# Patient Record
Sex: Male | Born: 1945 | Race: White | Hispanic: No | Marital: Married | Smoking: Former smoker
Health system: Southern US, Community
[De-identification: ages and names within clinical notes are randomized; demographics above are authoritative.]

## PROBLEM LIST (undated history)

## (undated) DIAGNOSIS — M1990 Unspecified osteoarthritis, unspecified site: Secondary | ICD-10-CM

## (undated) DIAGNOSIS — E079 Disorder of thyroid, unspecified: Secondary | ICD-10-CM

## (undated) DIAGNOSIS — K649 Unspecified hemorrhoids: Secondary | ICD-10-CM

## (undated) DIAGNOSIS — J449 Chronic obstructive pulmonary disease, unspecified: Secondary | ICD-10-CM

## (undated) DIAGNOSIS — I350 Nonrheumatic aortic (valve) stenosis: Secondary | ICD-10-CM

## (undated) DIAGNOSIS — E559 Vitamin D deficiency, unspecified: Secondary | ICD-10-CM

## (undated) DIAGNOSIS — E039 Hypothyroidism, unspecified: Secondary | ICD-10-CM

## (undated) DIAGNOSIS — F32A Depression, unspecified: Secondary | ICD-10-CM

## (undated) DIAGNOSIS — N200 Calculus of kidney: Secondary | ICD-10-CM

## (undated) DIAGNOSIS — I10 Essential (primary) hypertension: Secondary | ICD-10-CM

## (undated) DIAGNOSIS — R011 Cardiac murmur, unspecified: Secondary | ICD-10-CM

## (undated) DIAGNOSIS — I951 Orthostatic hypotension: Secondary | ICD-10-CM

## (undated) DIAGNOSIS — Z992 Dependence on renal dialysis: Secondary | ICD-10-CM

## (undated) DIAGNOSIS — F329 Major depressive disorder, single episode, unspecified: Secondary | ICD-10-CM

## (undated) DIAGNOSIS — I2510 Atherosclerotic heart disease of native coronary artery without angina pectoris: Secondary | ICD-10-CM

## (undated) DIAGNOSIS — R55 Syncope and collapse: Secondary | ICD-10-CM

## (undated) DIAGNOSIS — E785 Hyperlipidemia, unspecified: Secondary | ICD-10-CM

## (undated) DIAGNOSIS — E782 Mixed hyperlipidemia: Secondary | ICD-10-CM

## (undated) DIAGNOSIS — N186 End stage renal disease: Secondary | ICD-10-CM

## (undated) DIAGNOSIS — M4306 Spondylolysis, lumbar region: Secondary | ICD-10-CM

## (undated) DIAGNOSIS — Z9889 Other specified postprocedural states: Secondary | ICD-10-CM

## (undated) DIAGNOSIS — M109 Gout, unspecified: Secondary | ICD-10-CM

## (undated) HISTORY — PX: CAROTID ENDARTERECTOMY: SUR193

## (undated) HISTORY — DX: Unspecified osteoarthritis, unspecified site: M19.90

## (undated) HISTORY — DX: Gout, unspecified: M10.9

## (undated) HISTORY — DX: Nonrheumatic aortic (valve) stenosis: I35.0

## (undated) HISTORY — DX: Atherosclerotic heart disease of native coronary artery without angina pectoris: I25.10

## (undated) HISTORY — PX: OTHER SURGICAL HISTORY: SHX169

## (undated) HISTORY — DX: Depression, unspecified: F32.A

## (undated) HISTORY — DX: Major depressive disorder, single episode, unspecified: F32.9

## (undated) HISTORY — DX: Chronic obstructive pulmonary disease, unspecified: J44.9

## (undated) HISTORY — DX: Spondylolysis, lumbar region: M43.06

## (undated) HISTORY — DX: Essential (primary) hypertension: I10

## (undated) HISTORY — DX: End stage renal disease: Z99.2

## (undated) HISTORY — PX: CARDIAC CATHETERIZATION: SHX172

## (undated) HISTORY — DX: Cardiac murmur, unspecified: R01.1

## (undated) HISTORY — DX: Calculus of kidney: N20.0

## (undated) HISTORY — DX: Hyperlipidemia, unspecified: E78.5

## (undated) HISTORY — DX: Syncope and collapse: R55

## (undated) HISTORY — DX: Disorder of thyroid, unspecified: E07.9

## (undated) HISTORY — DX: Vitamin D deficiency, unspecified: E55.9

## (undated) HISTORY — DX: Morbid (severe) obesity due to excess calories: E66.01

## (undated) HISTORY — DX: Hypothyroidism, unspecified: E03.9

## (undated) HISTORY — PX: CIRCUMCISION: SUR203

## (undated) HISTORY — PX: JOINT REPLACEMENT: SHX530

## (undated) HISTORY — DX: Orthostatic hypotension: I95.1

## (undated) HISTORY — DX: Mixed hyperlipidemia: E78.2

## (undated) HISTORY — DX: End stage renal disease: N18.6

---

## 2001-07-28 ENCOUNTER — Ambulatory Visit (HOSPITAL_COMMUNITY): Admission: RE | Admit: 2001-07-28 | Discharge: 2001-07-28 | Payer: Self-pay | Admitting: Endocrinology

## 2002-07-28 ENCOUNTER — Emergency Department (HOSPITAL_COMMUNITY): Admission: EM | Admit: 2002-07-28 | Discharge: 2002-07-28 | Payer: Self-pay | Admitting: Internal Medicine

## 2002-07-29 ENCOUNTER — Emergency Department (HOSPITAL_COMMUNITY): Admission: EM | Admit: 2002-07-29 | Discharge: 2002-07-29 | Payer: Self-pay | Admitting: Emergency Medicine

## 2002-07-29 ENCOUNTER — Encounter: Payer: Self-pay | Admitting: Emergency Medicine

## 2002-07-30 ENCOUNTER — Emergency Department (HOSPITAL_COMMUNITY): Admission: EM | Admit: 2002-07-30 | Discharge: 2002-07-30 | Payer: Self-pay | Admitting: *Deleted

## 2002-07-31 ENCOUNTER — Ambulatory Visit (HOSPITAL_COMMUNITY): Admission: RE | Admit: 2002-07-31 | Discharge: 2002-07-31 | Payer: Self-pay | Admitting: Pulmonary Disease

## 2002-08-01 ENCOUNTER — Encounter (HOSPITAL_COMMUNITY): Admission: RE | Admit: 2002-08-01 | Discharge: 2002-08-10 | Payer: Self-pay | Admitting: Pulmonary Disease

## 2002-12-11 ENCOUNTER — Encounter: Payer: Self-pay | Admitting: *Deleted

## 2002-12-11 ENCOUNTER — Inpatient Hospital Stay (HOSPITAL_COMMUNITY): Admission: EM | Admit: 2002-12-11 | Discharge: 2002-12-13 | Payer: Self-pay | Admitting: *Deleted

## 2002-12-12 ENCOUNTER — Encounter: Payer: Self-pay | Admitting: Cardiovascular Disease

## 2002-12-22 ENCOUNTER — Encounter (HOSPITAL_COMMUNITY): Admission: RE | Admit: 2002-12-22 | Discharge: 2003-01-21 | Payer: Self-pay | Admitting: Endocrinology

## 2003-03-22 ENCOUNTER — Encounter: Payer: Self-pay | Admitting: Cardiovascular Disease

## 2003-03-22 ENCOUNTER — Ambulatory Visit (HOSPITAL_COMMUNITY): Admission: RE | Admit: 2003-03-22 | Discharge: 2003-03-22 | Payer: Self-pay | Admitting: Cardiovascular Disease

## 2008-04-30 ENCOUNTER — Ambulatory Visit (HOSPITAL_COMMUNITY): Admission: RE | Admit: 2008-04-30 | Discharge: 2008-04-30 | Payer: Self-pay | Admitting: Pulmonary Disease

## 2008-09-20 ENCOUNTER — Ambulatory Visit (HOSPITAL_COMMUNITY): Admission: RE | Admit: 2008-09-20 | Discharge: 2008-09-20 | Payer: Self-pay | Admitting: Pulmonary Disease

## 2008-09-24 ENCOUNTER — Encounter (INDEPENDENT_AMBULATORY_CARE_PROVIDER_SITE_OTHER): Payer: Self-pay | Admitting: Pulmonary Disease

## 2008-09-24 ENCOUNTER — Ambulatory Visit: Payer: Self-pay | Admitting: Internal Medicine

## 2008-09-24 ENCOUNTER — Ambulatory Visit (HOSPITAL_COMMUNITY): Admission: RE | Admit: 2008-09-24 | Discharge: 2008-09-24 | Payer: Self-pay | Admitting: Pulmonary Disease

## 2008-10-08 ENCOUNTER — Ambulatory Visit: Payer: Self-pay | Admitting: Surgery

## 2008-10-17 ENCOUNTER — Encounter: Payer: Self-pay | Admitting: Surgery

## 2008-10-17 ENCOUNTER — Ambulatory Visit: Payer: Self-pay | Admitting: Surgery

## 2008-10-17 ENCOUNTER — Inpatient Hospital Stay (HOSPITAL_COMMUNITY): Admission: RE | Admit: 2008-10-17 | Discharge: 2008-10-18 | Payer: Self-pay | Admitting: Surgery

## 2008-11-05 ENCOUNTER — Ambulatory Visit: Payer: Self-pay | Admitting: Surgery

## 2009-04-29 ENCOUNTER — Inpatient Hospital Stay (HOSPITAL_COMMUNITY): Admission: RE | Admit: 2009-04-29 | Discharge: 2009-05-01 | Payer: Self-pay | Admitting: Orthopedic Surgery

## 2009-05-20 ENCOUNTER — Encounter (HOSPITAL_COMMUNITY): Admission: RE | Admit: 2009-05-20 | Discharge: 2009-06-19 | Payer: Self-pay | Admitting: Orthopedic Surgery

## 2009-06-05 ENCOUNTER — Inpatient Hospital Stay (HOSPITAL_COMMUNITY): Admission: RE | Admit: 2009-06-05 | Discharge: 2009-06-07 | Payer: Self-pay | Admitting: Orthopedic Surgery

## 2009-07-01 ENCOUNTER — Encounter (HOSPITAL_COMMUNITY): Admission: RE | Admit: 2009-07-01 | Discharge: 2009-07-31 | Payer: Self-pay | Admitting: Orthopedic Surgery

## 2010-09-30 LAB — BASIC METABOLIC PANEL
BUN: 29 mg/dL — ABNORMAL HIGH (ref 6–23)
CO2: 27 mEq/L (ref 19–32)
Chloride: 102 mEq/L (ref 96–112)
Creatinine, Ser: 1.67 mg/dL — ABNORMAL HIGH (ref 0.4–1.5)
GFR calc non Af Amer: 42 mL/min — ABNORMAL LOW (ref >60)
GFR calc non Af Amer: >60 mL/min (ref >60)
Glucose, Bld: 106 mg/dL — ABNORMAL HIGH (ref 70–99)
Potassium: 4.3 mEq/L (ref 3.5–5.1)
Sodium: 135 mEq/L (ref 135–145)

## 2010-09-30 LAB — URINALYSIS, ROUTINE W REFLEX MICROSCOPIC
Bilirubin Urine: NEGATIVE
Nitrite: NEGATIVE
Specific Gravity, Urine: 1.013 (ref 1.005–1.030)
pH: 5.5 (ref 5.0–8.0)

## 2010-09-30 LAB — PROTIME-INR
INR: 1.24 (ref 0.00–1.49)
Prothrombin Time: 13.9 seconds (ref 11.6–15.2)
Prothrombin Time: 15.5 seconds — ABNORMAL HIGH (ref 11.6–15.2)

## 2010-09-30 LAB — APTT: aPTT: 29 seconds (ref 24–37)

## 2010-09-30 LAB — TYPE AND SCREEN: Antibody Screen: NEGATIVE

## 2010-09-30 LAB — CBC
HCT: 40.1 % (ref 39.0–52.0)
Hemoglobin: 13.9 g/dL (ref 13.0–17.0)
MCV: 94 fL (ref 78.0–100.0)
Platelets: 223 10*3/uL (ref 150–400)
RBC: 3.43 MIL/uL — ABNORMAL LOW (ref 4.22–5.81)
RDW: 13.7 % (ref 11.5–15.5)
WBC: 10.8 10*3/uL — ABNORMAL HIGH (ref 4.0–10.5)
WBC: 11.9 10*3/uL — ABNORMAL HIGH (ref 4.0–10.5)

## 2010-10-01 LAB — CBC
HCT: 36.7 % — ABNORMAL LOW (ref 39.0–52.0)
Hemoglobin: 12.8 g/dL — ABNORMAL LOW (ref 13.0–17.0)
MCV: 98.1 fL (ref 78.0–100.0)
Platelets: 240 10*3/uL (ref 150–400)
RBC: 3.48 MIL/uL — ABNORMAL LOW (ref 4.22–5.81)
WBC: 10.3 10*3/uL (ref 4.0–10.5)
WBC: 11.7 10*3/uL — ABNORMAL HIGH (ref 4.0–10.5)

## 2010-10-01 LAB — BASIC METABOLIC PANEL
BUN: 23 mg/dL (ref 6–23)
GFR calc non Af Amer: 40 mL/min — ABNORMAL LOW (ref >60)
Potassium: 4.3 mEq/L (ref 3.5–5.1)
Sodium: 134 mEq/L — ABNORMAL LOW (ref 135–145)

## 2010-10-01 LAB — PROTIME-INR
INR: 1.06 (ref 0.00–1.49)
Prothrombin Time: 16.2 seconds — ABNORMAL HIGH (ref 11.6–15.2)

## 2010-10-02 LAB — DIFFERENTIAL
Eosinophils Relative: 2 % (ref 0–5)
Lymphocytes Relative: 36 % (ref 12–46)
Lymphs Abs: 3.7 10*3/uL (ref 0.7–4.0)
Monocytes Absolute: 0.7 10*3/uL (ref 0.1–1.0)
Monocytes Relative: 7 % (ref 3–12)
Neutro Abs: 5.7 10*3/uL (ref 1.7–7.7)

## 2010-10-02 LAB — URINALYSIS, ROUTINE W REFLEX MICROSCOPIC
Bilirubin Urine: NEGATIVE
Hgb urine dipstick: NEGATIVE
Ketones, ur: NEGATIVE mg/dL
Protein, ur: NEGATIVE mg/dL
Urobilinogen, UA: 0.2 mg/dL (ref 0.0–1.0)

## 2010-10-02 LAB — BASIC METABOLIC PANEL
GFR calc Af Amer: >60 mL/min (ref >60)
GFR calc non Af Amer: 58 mL/min — ABNORMAL LOW (ref >60)
Glucose, Bld: 83 mg/dL (ref 70–99)
Potassium: 4.8 mEq/L (ref 3.5–5.1)
Sodium: 138 mEq/L (ref 135–145)

## 2010-10-02 LAB — CBC
HCT: 45.5 % (ref 39.0–52.0)
Hemoglobin: 15.8 g/dL (ref 13.0–17.0)
RBC: 4.71 MIL/uL (ref 4.22–5.81)
WBC: 10.4 10*3/uL (ref 4.0–10.5)

## 2010-10-02 LAB — APTT: aPTT: 30 seconds (ref 24–37)

## 2010-10-08 LAB — CBC
HCT: 35.7 % — ABNORMAL LOW (ref 39.0–52.0)
Hemoglobin: 12.5 g/dL — ABNORMAL LOW (ref 13.0–17.0)
Hemoglobin: 15.4 g/dL (ref 13.0–17.0)
MCHC: 35.2 g/dL (ref 30.0–36.0)
MCHC: 34.8 g/dL (ref 30.0–36.0)
MCV: 95.7 fL (ref 78.0–100.0)
RBC: 3.76 MIL/uL — ABNORMAL LOW (ref 4.22–5.81)
RDW: 12.9 % (ref 11.5–15.5)
RDW: 12.9 % (ref 11.5–15.5)

## 2010-10-08 LAB — BASIC METABOLIC PANEL
CO2: 24 mEq/L (ref 19–32)
Chloride: 107 mEq/L (ref 96–112)
Glucose, Bld: 103 mg/dL — ABNORMAL HIGH (ref 70–99)
Potassium: 4.1 mEq/L (ref 3.5–5.1)
Sodium: 136 mEq/L (ref 135–145)

## 2010-10-08 LAB — URINALYSIS, ROUTINE W REFLEX MICROSCOPIC
Glucose, UA: NEGATIVE mg/dL
Ketones, ur: NEGATIVE mg/dL
Nitrite: NEGATIVE
Protein, ur: 30 mg/dL — AB
Urobilinogen, UA: 0.2 mg/dL (ref 0.0–1.0)

## 2010-10-08 LAB — COMPREHENSIVE METABOLIC PANEL
ALT: 23 U/L (ref 0–53)
Calcium: 9.4 mg/dL (ref 8.4–10.5)
Creatinine, Ser: 1.29 mg/dL (ref 0.4–1.5)
GFR calc Af Amer: >60 mL/min (ref >60)
GFR calc non Af Amer: 56 mL/min — ABNORMAL LOW (ref >60)
Glucose, Bld: 83 mg/dL (ref 70–99)
Sodium: 138 mEq/L (ref 135–145)
Total Protein: 6.9 g/dL (ref 6.0–8.3)

## 2010-10-08 LAB — URINE MICROSCOPIC-ADD ON

## 2010-10-08 LAB — TYPE AND SCREEN: Antibody Screen: NEGATIVE

## 2010-10-08 LAB — PROTIME-INR: Prothrombin Time: 13 seconds (ref 11.6–15.2)

## 2010-10-08 LAB — APTT: aPTT: 31 seconds (ref 24–37)

## 2010-10-08 LAB — ABO/RH: ABO/RH(D): A NEG

## 2010-11-11 NOTE — Procedures (Signed)
CAROTID DUPLEX EXAM   INDICATION:  Follow-up evaluation of known carotid artery disease.   HISTORY:  Diabetes:  No.  Cardiac:  PTCA.  Hypertension:  Yes.  Smoking:  Yes.  Previous Surgery:  No.  CV History:  Patient had an episode of expressive aphasia in January.  Patient has a history of TIA and left-sided facial tingling.  Amaurosis Fugax No, Paresthesias  Yes, Hemiparesis No.                                       RIGHT             LEFT  Brachial systolic pressure:         146               148  Brachial Doppler waveforms:         Triphasic         Triphasic  Vertebral direction of flow:        Antegrade         Antegrade  DUPLEX VELOCITIES (cm/sec)  CCA peak systolic                   111               123456  ECA peak systolic                   190               [id number]  ICA peak systolic                   109               123456  ICA end diastolic                   40                228  PLAQUE MORPHOLOGY:                  Mixed             Calcified  PLAQUE AMOUNT:                      Mild-to-moderate  Severe  PLAQUE LOCATION:                    Proximal ICA      Proximal ICA, ECA   IMPRESSION:  1. 20-39% right internal carotid artery stenosis.  2. 80-99% left internal carotid artery stenosis.   ___________________________________________  V. Leia Alf, MD   MC/MEDQ  D:  10/08/2008  T:  10/08/2008  Job:  [id number]

## 2010-11-11 NOTE — Op Note (Signed)
NAMEEVERTTE, UNKEFER                 ACCOUNT NO.:  [id number]   MEDICAL RECORD NO.:  [id number]          PATIENT TYPE:  INP   LOCATION:  3301                         FACILITY:  [facility]   PHYSICIAN:  Theotis Burrow IV, MDDATE OF BIRTH:  [birth year]   DATE OF PROCEDURE:  10/17/2008  DATE OF DISCHARGE:                               OPERATIVE REPORT   PREOPERATIVE DIAGNOSIS:  Symptomatic left carotid stenosis   POSTOPERATIVE DIAGNOSIS:  Symptomatic left carotid stenosis   PROCEDURE PERFORMED:  Left carotid endarterectomy with patch  angioplasty.   TYPE OF ANESTHESIA:  General.   BLOOD LOSS:  200 mL.   COMPLICATIONS:  None.   DRAINS:  [place].   FINDINGS:  80% stenosis, ulcerated plaque.  No thrombus.   INDICATIONS:  Mr. Feltham is a 65 year old gentleman who presented with  symptomatic left carotid stenosis.  He was found by ultrasound to have  greater than 8% stenosis.  He comes in today for endarterectomy.  Risks  and benefits were discussed.   SURGEON:  1. Leia Alf, MD   ASSISTANT:  Jacinta Shoe, PA   PROCEDURE:  The patient was identified in the holding area and taken to  room #8.  He was placed supine on the table.  General endotracheal  anesthesia was administered.  The patient was prepped and draped in the  standard sterile fashion.  A time-out was called.  Antibiotics were  given.  An incision was made along the anterior border of the  sternocleidomastoid.  Cautery was used to divide the subcutaneous  tissue.  The platysma muscle was divided with Bovie cautery.  Next, the  carotid sheath was identified and opened sharply.  The common carotid  artery was then identified and mobilized.  It was then encircled with  umbilical tape.  Dissection then proceeded cephalad.  The superior  thyroid artery was identified and encircled with silk tie.  Next, the  common facial vein was identified and ligated between 2-0 silk ties.  Multiple venous branches were  identified each individually and ligated  between 3-0 silk ties.  The posterior belly of the digastric muscle was  identified.  Under this, the hypoglossal nerve was identified, partially  dissected and mobilized and protected.  Next, the external carotid  artery was identified and encircled with vessel loop.  The internal  carotid artery was then dissected free up to the level of the  hypoglossal nerve where it was free of disease.  At this point, the  umbilical tape was placed around the internal carotid artery.  The  patient was given systemic heparinization.  After the heparin had  circulated, the internal carotid artery was occluded followed by the  external carotid and common carotid artery.  A #11 blade was used to  make an arteriotomy in the common carotid artery.  This was extended on  the anterior lateral surface of the internal carotid artery with Potts  scissors.  An ulcerated plaque was identified at the carotid bifurcation  extending into the internal carotid.  There was approximately 80%  stenosis.  There was  no thrombus present.  Next, a 10-French shunt was  placed.  Endarterectomy was then performed with a Kleinert-Kutz  elevator.  Eversion endarterectomy was performed in the external carotid  artery.  A good distal endpoint was obtained and the plaque was removed.  The endarterectomized bed was irrigated with heparin saline.  All  potential embolic debris was removed.  Next, patch angioplasty was  performed with a bovine pericardial patch and a running 6-0 Prolene.  Prior to completion of repair, the shunt was removed.  The arteries were  all appropriately flushed.  The endarterectomy was then completed.  Clamp was released on the external carotid artery followed by the common  carotid artery.  Approximately 15 seconds later, the clamp on the  internal carotid artery was released.  Handheld Doppler was used to  evaluate flow within the common external and internal carotid  arteries.  All had good signals.  Next, the patient's heparin was reversed with 50  mg of protamine.  A #15 Blake drain was placed and brought out through a  separate skin incision and sewn in place with a 3-0 nylon.  The carotid  sheath was then reapproximated with a running 3-0 Vicryl.  The platysma  muscle was reapproximated with a running 3-0 Vicryl.  The skin was  closed with 4-0 Vicryl.  The patient tolerated the procedure well and  was successfully extubated and found to be moving all 4 extremities to  command.  He was taken to the recovery room in stable condition.      Eldridge Abrahams, MD  Electronically Signed     VWB/MEDQ  D:  10/17/2008  T:  10/18/2008  Job:  [id number]

## 2010-11-11 NOTE — Assessment & Plan Note (Signed)
OFFICE VISIT   DEWONE, SUNDERMAN  DOB:  Feb 13, 1946                                       11/05/2008  [id number]   REASON FOR VISIT:  Follow-up carotid.   HISTORY:  This 65 year old gentleman that I saw at the request of Dr.  Luan Pulling for evaluation of symptomatic carotid disease.  The patient had  two episodes of TIAs which consisted of confusion and memory problems as  well as facial numbness.  MRA and MRI did not reveal any intracranial  pathology.  The patient underwent left carotid endarterectomy on October 17, 2008.  Operative findings included 80% stenosis with an ulcerated  plaque without thrombus.  The patient's postoperative course was  uncomplicated.  He was discharged home on the following day. He comes in  today for follow-up.  He has no complaints.  He is doing very well at  this time.   The patient will follow with me in 35-month with a repeat ultrasound.  He  also was concerned about some leg pain.  I could not feel pedal pulses  and sent him for lower extremity study which showed he has  normal ankle  brachial indices.  Again he will follow up in 6 months.   Eldridge Abrahams, MD  Electronically Signed   VWB/MEDQ  D:  11/05/2008  T:  11/06/2008  Job:  [id number]   cc:   Percell Miller L. Luan Pulling, M.D.  Richard A. Rollene Fare, M.D.

## 2010-11-11 NOTE — Assessment & Plan Note (Signed)
OFFICE VISIT   BABU, GOHN D  DOB:  [birth year]                                       10/08/2008  [id number]   REASON FOR VISIT:  Symptomatic left carotid stenosis.   HISTORY:  This is a 65 year old gentleman I am seeing at the request Dr.  Luan Pulling for evaluation of symptomatic carotid disease.  The patient  states that back in January he was described as having an episode where  he was unable to answer questions correctly and was very confused, this  lasted for approximately 3 minutes.  The patient also had an episode 4-5  weeks ago which consisted of left facial numbness lasting for  approximately 1-2 minutes.  He was convinced to go to the hospital after  a second episode.  At [facility] he underwent a carotid ultrasound which  showed left carotid stenosis as well as an MRA, MRI which did not reveal  any intracranial pathology.  These were the patient's only two episodes.  He denies numbness or weakness in either extremity, he denies amaurosis  fugax.   MEDICAL HISTORY:  Hypertension, hypercholesterolemia.  He has had a  heart attack back in 1985.  He most recently underwent coronary  angiogram by Dr. Rollene Fare in 2004.  He is status post thyroid ablation.   REVIEW OF SYSTEMS:  GENERAL:  Positive for weight gain.  No fevers, no  chills.  CARDIAC:  Positive for shortness of breath with exertion.  Positive for  heart murmur.  PULMONARY:  Positive for wheezing.  GI:  Has a history of blood in stool.  GU:  Negative.  VASCULAR:  Positive for pain in legs when walking.  Positive for min-  stroke.  NEURO:  Negative.  ORTHO:  Positive for arthritis, joint pain, muscle pain.  PSYCH:  Positive for depression, nervousness.  ENT:  Negative.  HEME:  Negative.   PAST MEDICAL HISTORY:  Hypertension, hypercholesterolemia, history of  MI, hypothyroidism.   PAST SURGICAL HISTORY:  Angioplasty x2, thyroid ablation without  radioactive iodine.   FAMILY  HISTORY:  Is negative for cardiovascular at an early age.   SOCIAL HISTORY:  He is married with 1 child.  He is retired.  Smokes  approximately 2 packs a day.  Does not drink.   MEDICATIONS:  1. Benicar 40 mg per day.  2. Zocor 40 mg per day.  3. Zoloft 100 mg per day.  4. Synthroid 0.2 mcg per day.  5. Lopid 600 mg per day.  6. Aspirin.  7. Fish Oil.  8. Multivitamin.  9. Osteo Bi-Flex.   ALLERGIES:  None.   PHYSICAL EXAMINATION:  Blood pressure is 136/76, pulse 71.  General:  He  is well-appearing, no distress.  HEENT:  Normocephalic, atraumatic.  Pupils equal, sclerae anicteric.  Neck:  Supple.  No JVD.  Cardiovascular:  Regular rate and rhythm.  Positive murmur.  Pulmonary:  Lungs are clear bilaterally.  Abdomen:  Soft, nontender.  Extremities:  Warm and well perfused.  Neuro:  Cranial nerves II-XII are grossly  intact.  He has a nonfocal presentation.  Psych:  He is alert and  oriented x3.  Skin:  Without rash.   DIAGNOSTIC STUDIES:  The patient underwent a left carotid ultrasound  today which reveals 80% to 99% left carotid stenosis, end diastolic  velocity on the left is 228.  The patient has a normal anatomy,  bifurcation is mid hyoid.  Carotid is normal passive stenosis.   ASSESSMENT/PLAN:  Symptomatic left carotid stenosis.  The patient  appears to be having transient ischemic attacks, having had two since  January.  I think we need to proceed with a left carotid endarterectomy.  I discussed the risks and benefits of the procedure with the patient and  his companion, including the risk of stroke, the risk of cardiac  disease, nerve injury, bleeding, infection.  Since it has been 6 years  since his heart has been evaluated, I am going to send him back to see  Dr. Rollene Fare for cardiac clearance; however, I will plan on proceeding  with his operation on Wednesday, April 21st.   Eldridge Abrahams, MD  Electronically Signed   VWB/MEDQ  D:  10/08/2008  T:   10/09/2008  Job:  [id number]   cc:   Percell Miller L. Luan Pulling, M.D.  Richard A. Rollene Fare, M.D.

## 2010-11-14 NOTE — Discharge Summary (Signed)
NAMEQUINDELL, REUTHER                 ACCOUNT NO.:  [id number]   MEDICAL RECORD NO.:  [id number]          PATIENT TYPE:  INP   LOCATION:  3301                         FACILITY:  [facility]   PHYSICIAN:  Theotis Burrow IV, MDDATE OF BIRTH:  06-11-1946   DATE OF ADMISSION:  10/17/2008  DATE OF DISCHARGE:  10/18/2008                               DISCHARGE SUMMARY   FINAL DISCHARGE DIAGNOSES:  1. Symptomatic left carotid occlusive disease.  2. Hypertension.  3. Dyslipidemia.  4. Coronary artery disease, status post an myocardial infarction in      1985.   PROCEDURES PERFORMED:  Left carotid endarterectomy with bovine  pericardial patch angioplasty by Dr. Trula Slade on October 17, 2008.   COMPLICATIONS:  None.   CONDITION AT DISCHARGE:  Stable, improving.   DISCHARGE MEDICATIONS:  1. Simvastatin 40 mg p.o. daily.  2. Celebrex 200 mg p.o. daily.  3. Gemfibrozil 600 mg p.o. b.i.d.  4. Synthroid 0.2 mg p.o. daily.  5. Sertraline 100 mg p.o. daily.  6. Benicar 40 mg p.o. daily.  7. Aspirin 325 mg p.o. daily.  8. MVI 1 tablet p.o. daily.  9. Feosol 1000 mg p.o. daily.  10.Osteo Bi-Flex 2 tablets p.o. daily.  11.Vitamin D 2000 mg p.o. daily.  12.He is given a prescription for Percocet 5/325 one to two p.o. q.4      h. p.r.n. pain, total #30 were given.   DISPOSITION:  He is being discharged home in stable condition with his  wounds healing well.  He is given careful instructions regarding the  care of his wounds and his activity level.  He is given a return  appointment in 2-3 weeks with Dr. Trula Slade.  The office will make the  appointment.   BRIEF IDENTIFYING STATEMENT:  For complete details, please refer the  typed history and physical.  Briefly, this very pleasant 65 year old  gentleman was evaluated by Dr. Trula Slade for symptomatic left carotid  occlusive disease.  Dr. Trula Slade recommended left carotid endarterectomy  for stroke prevention.  He was informed of the risks and benefits of  the  procedure and after careful consideration, elected to proceed with  surgery.   HOSPITAL COURSE:  Preoperative workup was completed as an outpatient.  He was brought in through [facility]Day Surgery, and underwent the  aforementioned left carotid endarterectomy.  For complete details,  please refer the typed operative report.  The procedure was without  complications.  He was returned to the [facility] [facility],  extubated.  Following stabilization, he was transferred to a bed on a  Surgical Step-Down Unit.  He was  observed overnight.  The following morning, his condition was found to  be stable.  He did have some slight tongue deviation to the left, which  should improve over the next several weeks.  He was otherwise  neurologically intact.  He was desirous of discharge and was discharged  home in stable condition.       Chad Cordial, PA      V. Leia Alf, MD  Electronically Signed    KEL/MEDQ  D:  10/19/2008  T:  10/20/2008  Job:  [id number]

## 2010-11-14 NOTE — Discharge Summary (Signed)
NAMEYACOV, TAPLEY                           ACCOUNT NO.:  [id number]   MEDICAL RECORD NO.:  [id number]                   PATIENT TYPE:  INP   LOCATION:  2006                                 FACILITY:  [facility]   PHYSICIAN:  Richard A. Rollene Fare, M.D.          DATE OF BIRTH:  1945-11-17   DATE OF ADMISSION:  12/11/2002  DATE OF DISCHARGE:  12/13/2002                                 DISCHARGE SUMMARY   ADMISSION DIAGNOSES:  1. Unstable angina.  2. Heavy tobacco use.  3. Hypertension.  4. Hyperlipidemia.  5. Questionable history of coronary artery disease with a history of     angioplasty 15 years ago per the patient but no record is available.  6. History of thyroid disease.  7. Newly diagnosed murmur on exam.  8. Arthritis.  9. Depression.   DISCHARGE DIAGNOSES:  1. Unstable angina.  2. Heavy tobacco use.  3. Hypertension.  4  Hyperlipidemia.  1. Questionable history of coronary artery disease with history of     angioplasty 15 years ago per patient but no record is available.  2. History of thyroid disease.  3. Newly diagnosed murmur on exam.  4. Arthritis.  5. Depression.  6. Status post cardiac catheterization on 12/12/02 by Dr. Terance Ice.     This included intravascular ultrasound interrogation of the proximal left     anterior descending.  There was found to be no significant/obstructive     coronary artery disease and patient was planned for continued medical     therapy of the coronary disease.  7. Hypertriglyceridemia.  8. Hyperuricemia.  9. Abnormal thyroid function tests.  10.      Murmur on exam with plans for follow up echocardiogram.   HISTORY OF PRESENT ILLNESS:  Mr. Blinn is a 65 year old, married white male  with a history of CAD, recurrent heavy tobacco use, hypertension,  hyperlipidemia, who had been admitted to [facility] by Dr. Sinda Du.  We were subsequently asked to consult on him for evaluation of  chest pain and possible  angina.  He had begun having intermittent left upper  arm aching two days prior to the admission on 12/09/02.  One day prior to  admission he had substernal chest discomfort which he felt was indigestion.  It was non radiating and lasted one to two hours, it then eased off and did  not recur.  He did feel somewhat tired after this.   On the day of admission he went to work and had recurrence of sternal chest  pressure which felt like a fullness and tightness in his chest with some  radiation to the left upper extremity.  He drove himself to the emergency  room where was seen in [place] and admitted to the hospital by Dr.  Sinda Du.  Dr. Rollene Fare was asked to see him in consultation once he  was admitted to telemetry.  He had been given sublingual Nitroglycerin in  the ER with significant improvement in his substernal chest pressure but not  complete relief of the symptoms.  On exam at that point he was  hemodynamically stable.  Blood pressure was somewhat elevated at 160/90.  Cardiac exam was notable for a 2 to 3/6 murmur.  EKG showed normal sinus  rhythm 97 beats per minute, nonspecific ST, T changes.   At that point he was seen and evaluated by Dr. Terance Ice.  It was  felt that he may be experiencing unstable angina.  We planned to continue IV  heparin, IV nitroglycerin as well as aspirin and beta blocker and ACE  inhibitor.  We planned for transfer to [facility] and planned to  proceed with cardiac catheterization on 12/12/02 unless he became unstable in  the interim.  Risks, benefits of the procedure were discussed with the  patient, he was willing to proceed.   HOSPITAL COURSE:  Late on 12/12/02 Mr. Serpe arrived from [facility]  to [facility] and was stable.  Later on 12/12/02 Mr. Reth underwent  cardiac catheterization by Dr. Terance Ice.  Please see the dictated  report for details.  He did perform IVUS interrogation of the  proximal LAD.  However, even with IVUS this did not show any high-grade significant CAD.  Other vessels also showed nonobstructive CAD.  Will plan for continued  medical therapy of his minimal coronary disease.  As well, we plan for  aggressive therapy for weight reduction, smoking and lipids and  hypertension.   On 12/13/02 Mr. Koskela remains stable.  He is having no further chest  discomfort, no shortness of breath, no problems with his groin site.  Afebrile at 97.0, pulse 69, blood pressure is about 110 to 120/60, oxygen  saturation is 97% on room air.  His labs are stable post-procedure.  We have  reviewed the fact that his uric acid level is elevated and his TSH is low,  and T3 is elevated.  This will all need to be followed up.   DISCHARGE PHYSICAL EXAM:  At this time his lungs are clear, heart has a  regular rhythm with a 3/6 murmur.  His right groin site is healing well with  minimal ecchymosis and he is having no bleed.  At this point he is being  evaluated by Dr. Adrian Prows and deemed to be stable for discharge home.   HOSPITAL CONSULTATIONS:  Originally Dr. Rollene Fare was the consulting  physician when the patient was in [place].  However at the time of  transfer to [facility] Dr. Rollene Fare became the admitting  physician.   HOSPITAL PROCEDURES:  On 12/12/02 he underwent cardiac catheterization by Dr.  Terance Ice.  Please see the dictated report for details.  He was  found to have a non dominant right coronary artery that had no significant  disease.  He had a dominant circumflex but had no significant disease.  The  left anterior descending had some proximal disease that appeared to be about  40%.  Dr. Rollene Fare planned to proceed with IVUS interrogation of this.  Even with IVUS interrogation this was not found to be significant disease. We plan for continued medical therapy of the coronary artery disease.  He  had normal renal arteries, no mitral  regurgitation, normal left ventricular  function.  He tolerated the procedure well and had no complications.   LABORATORY DATA:  Lipid profile shows total cholesterol  166, triglyceride  245, HDL 38, LDL 79, uric acid elevated at 10.2.  TSH is low at less than  0.004, T4 is normal at 1.75, T3 elevated at 57.7.  Magnesium is 2.2, sodium  138, potassium 4.3, BUN 19, creatinine 1.3, glucose 102, white count 9700,  hemoglobin 14.2, hematocrit 40.9, platelets 227,000.  These are all post-  cath procedure.  Chest x-ray shows mild cardiomegaly, no acute disease,  lungs are clear.  Cardiac enzymes are all negative with CKs of 58 and 55, MB  2.8 and 2.5, troponin less than 0.01 and 0.02.  EKG showed normal sinus  rhythm, nonspecific ST, T change.   DISCHARGE MEDICATIONS:  1. Tri-Chlor 160 mg one at h.s.  2. Altace 5 mg one at h.s.  3. Toprol XL 50 mg daily.  4. Allopurinol 100 mg daily.  5. Protonix 40 mg one daily.  6. Zocor - continue current dose.  7. Aspirin - continue current dose.  8. Celebrex as before.  9. Lexapro 10 mg once daily.  10.      Magnesium: he may possibly stop this, follow up with Dr. Luan Pulling.  11.      Tapazole - continue current dose for now but follow up with Dr.     Luan Pulling first available appointment as dose will need to be adjusted     because thyroid function tests are abnormal.  12.      Stop taking Micardis.  13.      Nitroglycerin as directed for chest pain.   DISCHARGE INSTRUCTIONS:  1. No strenuous activity, lifting more than five pounds, driving, or sexual     activity for three days.  2. Low cholesterol, low fat diet.  3. Stop smoking.  4. May gently wash the groin site with warm water and soap.  5. Call [phone] for any bleeding or increased drainage at groin site.  6. May return to work on Monday, 12/18/02.  7. He is scheduled for an echocardiogram in the [facility] office 12/20/02 at     1300 hours to follow up his murmur.  8. He has an appointment to  follow with Dr. Rollene Fare 01/02/03 at [place]. in     the [place] office.  He is to be there at [place]. to complete paper     work.  NOTE: During the hospitalization he did undergo extensive smoking cessation  consulting and the patient really did not relay that he was sure that he was  ready to quit.  This will need aggressive outpatient follow up.     Mary B. Easley, P.A.-C.                   Richard A. Rollene Fare, M.D.    MBE/MEDQ  D:  12/13/2002  T:  12/13/2002  Job:  [id number]   cc:   Percell Miller L. Luan Pulling, M.D.  [place]  [place] [address]  Fax: [phone]    cc:   Jasper Loser. Luan Pulling, M.D.  [place]  [place] [address]  Fax: [phone]

## 2010-11-14 NOTE — Consult Note (Signed)
[name], [name]                             ACCOUNT NO.:  [id number]   MEDICAL RECORD NO.:  [id number]                  PATIENT TYPE:   LOCATION:                                       FACILITY:   PHYSICIAN:  Richard A. Rollene Fare, M.D.          DATE OF BIRTH:   DATE OF CONSULTATION:  12/11/2002  DATE OF DISCHARGE:                                   CONSULTATION   REFERRING PHYSICIAN:  Jasper Loser. Luan Pulling, M.D.   HISTORY:  Mr. Palanca is a 65 year old, white married father of 1.  He has a  history of coronary artery disease, recurrent heavy cigarette abuse,  hypertension, hyperlipidemia and we were asked to see him for chest pain  compatible with acute coronary syndrome.   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   He has a prior history of known coronary disease and had an angioplasty at  [facility] approximately 15 years ago. He does not know who  performed this, however.  He prefers to be seen by our group since he had  some prior problems with one of the other cardiology groups when his mother  was seen by them in the recent past.  He began having intermittent left  upper arm aching 2 days prior to admission December 09, 2002.  One day prior to  admission he had substernal chest discomfort which he felt was indigestion.  It was nonradiating but it lasted 1-2 hours and then eased off and did not  recur.  He did feel somewhat tired after this.   He went to work today and had recurrent substernal pressure, felt like a  fullness and tightness in his chest with some radiation to the left upper  extremity.  He drove himself to the emergency room and was seen in [facility] Emergency Room and admitted to the hospital.  I was asked to see him  and he was admitted to the telemetry unit on second floor.  He had been  given nitroglycerin sublingual in the emergency room with significant  improvement in his substernal pressure, but not complete relief.   The  patient has history of arthritis on Celebrex.  History of hypertension  on Micardis and has had prior hypokalemia treated as an outpatient.  He was  also found to be hyperlipidemic and started on Zocor.  He does not know  dosages of his current medications.  He has had a history of trauma to his  left lower extremity and developed a cellulitis which was treated as an  outpatient with local care and antibiotics January 2004 and resolved.  There  was no history of DVT at that time.   When he had his angioplasty 15 years ago he stopped smoking for about a year  and then he started again. He currently smokes 1-1/2 packs a day.  He is  married with 1 child.  Employment as above.   ALLERGIES:  No medication allergies.   No history of gout or diabetes.   He has a history of thyroid problems (both hyper and hypothyroidism history  but has not required medical therapy to date.  There is has been no syncope  or presyncope.   FAMILY HISTORY:  Not helpful.  He has one child and no grandchildren.   PHYSICAL EXAMINATION:  GENERAL:  He is mesomorphic.  Moderately obese and  quite anxious.  He has a resting tachycardia of 90-95 with a regular rhythm.  VITAL SIGNS:  Blood pressure is 160/90.  Respirations are 22 and not  labored.  He is afebrile to touch.  HEENT AND NECK:  He has a ruddy complexion.  There is no significant pallor.  He is euthyroid appearing. Carotid upstrokes are good without any  significant carotid bruits.  PERLA.  EOM intact.  No xanthelasmas. Thyroid  is not enlarged.  CHEST:  Shows diminished breath sounds without wheezes, rubs or rhonchi.  CARDIOVASCULAR:  His PMI is in the left sixth ICS and felt as an LP heave,  fairly localized with an S4 that is audible.  No S3 and a short 2/6 systolic  murmur at the left sternal border without significant radiation.  There is  no S3 present.  ABDOMEN:  The liver spleen and kidney are not palpable.  Abdomen is  nontender.  Abdomen is  obese.  EXTREMITIES:  Femorals are +2, DP and PT are +1 to +2.  Ther is trace venous  edema on the left.  No calf tenderness.  No pathologic reflexes.   EKG from the emergency room demonstrates sinus rhythm at 97 per minute with  nonspecific ST changes.  No acute ST segment changes and nonspecific PRWP V1  through V3.   ASSESSMENT AND PLAN:  New onset angina in a setting of patient with risk  factors as outline above.  Currently he is anxious, but essentially pain  free.  He has not been started on medical therapy as yet.   This patient's clinical findings are compatible with unstable angina without  evidence of ST elevation myocardial infarction and compatible with ACS.  I  would start him on aggressive therapy with nitro and intravenous heparin.  I  would consider antiplatelet therapy at present.  In view of the fact that he  has known coronary disease from 15 years ago, presumably this was single  vessel, he may have had significant disease progression which is likely.  I  am concerned about the possibility that he might be a surgical candidate so  I will start an IV __________ inhibitor in addition to beta blockers,  ancillary therapy of his hypertension, resting tachycardia. Follow up TFTs  with thyroid history along with continued statin therapy in addition of ACE  inhibitors if renal function is normal.   We plan to proceed with cardiac catheterization in the a.m. on December 12, 2002  unless the patient becomes unstable.  He is transferred to the CCU at  present at [facility] and I recommend transfer to [facility], today, if a bed is  available.                                               Richard A. Rollene Fare, M.D.    RAW/MEDQ  D:  12/11/2002  T:  12/11/2002  Job:  [id number]   cc:   Percell Miller L. Luan Pulling, M.D.  [place]  [place] [address]  Fax: [phone]

## 2010-11-14 NOTE — Cardiovascular Report (Signed)
[name], [name]                           ACCOUNT NO.:  [id number]   MEDICAL RECORD NO.:  [id number]                   PATIENT TYPE:  INP   LOCATION:  2104                                 FACILITY:  [place]   PHYSICIAN:  Richard A. Rollene Fare, M.D.          DATE OF BIRTH:  Mar 26, 1946   DATE OF PROCEDURE:  12/12/2002  DATE OF DISCHARGE:                              CARDIAC CATHETERIZATION   PROCEDURE:  1. Retrograde central aortic catheterization.  2. Selective coronary angiography by Judkins technique.  3. Left ventricular angiogram RAO/LAO projection.  4. Aortic root angiogram LAO projection.  5. Abdominal angiogram PA projection.  6. IVUS interrogation proximal left anterior descending and main left with     heparin and Integrilin administration.   PROCEDURE:  The patient was brought to the second floor CP laboratory in a  postabsorptive state after 5 mg Valium p.o. pre medication.  The right groin  was prepped, draped in the usual manner.  1% Xylocaine was used for local  anesthesia.  Both heparin and Integrilin were held, heparin on-call to the  laboratory and Integrilin in the laboratory.  The CRFA was entered with  single anterior puncture using modified Seldinger technique with an 18 thin  wall needle and a 6-French _______ side-arm sheath were inserted without  difficulty.  Catheterization was done with 6-French 4 cm taper preformed  coronary and pigtail cortis catheters using Omnipaque dye throughout the  procedure.   Several ostial and flush injections of the main left were done in multiple  projections visualizing this and the proximal and ostial LAD.  LV angiogram  was done in the RAO and LAO projection at 25 mL 14 mL/second and 20 mL 12  mL/second.  Pullback pressure of the CA was performed initially using a  pigtail catheter and then a multipurpose catheter because of catheter  grabbing.  Abdominal angiogram was done in mid stream PA projection with 25  mL 20  mL/second.  Subselective LIMA was done with a right coronary catheter  using hand injection.   Aortic root injection was done in the LAO projection 25 mL 20 mL/second.  It  was felt best to proceed with an IVUS interrogation of the proximal and  ostial LAD and main left and this was done through a 6-French 4 cm taper  side hole left 4 guiding catheter.  Heparin/Integrilin was restarted.  Heparin boluses were administered and ACT was greater than 230 seconds.  The  lesion was crossed with an [employer] saw 0.014 inch guidewire under fluoroscopic  control and a 40 MHz Scimed IVUS catheter was advanced under fluoroscopic  control through the proximal LAD with pullback IVUS interrogation performed  under fluoroscopic visualization.   There was mild plaque in the proximal LAD, mild dilatation after the very  proximal portion as visualized angiographically.  The ostial and proximal  LAD was approximately 2.5 x 2.6 mm with a cross sectional  area of  approximately 4.2-4.3 sq mm with moderate eccentric plaque, but good  residual lumen, no dissection plane.  The main left was a large vessel with  mild eccentric plaque and minimal eccentric calcification and measured 4.2 x  2.8 mm with a CSA of 9.4 sq mm.  There was no ostial stenosis of the left  main visualized.  Catheter was removed.  Side arm sheath was flushed.  The  patient was transferred to the holding area for an ACT measurement, sheath  removal, and pressure hemostasis when appropriate.  He tolerated the  procedure well.   PRESSURES:  LV:  110/0, LVEDP 18 mmHg.  A:  22 mmHg.  CA:  108/0, mean _____  mmHg.  There was a possible 0-5 mmHg gradient across the aortic valve on  catheter pullback; however, this was not demonstrated on each pullback and  did not appear to be present with the multipurpose catheter.  I suspect some  of this earlier was false gradient due to catheter grabbing.   LEFT VENTRICULAR ANGIOGRAM:  LV angiogram demonstrated  hypercontractile LV  with EF approximately 60%, angiographic LVH, no mitral regurgitation, and no  segmental wall motion abnormality.   LIMA was widely patent.  There was no subclavian stenosis.   ABDOMINAL ANGIOGRAM:  Abdominal angiogram revealed single patent renal  arteries bilaterally with no significant infrarenal atherosclerotic or  proximal iliac disease.  The hypogastrics were intact bilaterally and there  appeared to be good runoff bilaterally.   AORTIC ROOT INJECTION:  Aortic root injection in the LAO projection showed  no AI, trileaflet aortic valve, and good filling of both coronaries.  The  proximal aortic root appeared normal as did the aortic arch.   FLUOROSCOPY:  Fluoroscopy showed 1-2+ LAD calcification, no other  significant coronary or intracardiac calcification.   The main left coronary artery showed good flush back on hand injections and  no significant angiographic stenosis with a fairly large vessel.   The left anterior descending had smooth 40% narrowing in its ostial and  proximal portion before a mild area of irregularity and mild dilatation.  There was good flow through the proximal LAD, although it was mildly  hypodense and this is in part why the IVUS interrogation was done.   There was a very proximal OM 1 or OD that had 40-50% narrowing proximally in  the proximal third that did not appear high grade.  It then bifurcated and  was a moderate sized vessel with good flow.   The LAD had a large diagonal from the junction of the proximal and mid third  after the first septal perforator branch.  This trifurcated and had no  significant stenosis.  There was mild segmental irregularity with less than  30% narrowing throughout the mid LAD with no high grade stenosis in the  course of the apex and undersurface of the heart.   Circumflex was a dominant vessel with distal PDA and PLA, large PAVG and atrial branch.  This was mild irregularities, but a large  vessel with no  significant stenosis and normal marginal branches.   The right coronary was a nondominant vessel with normal RV branches.   DISCUSSION:  This 65 year old white married father of one has a history of  cigarette abuse, exogenous obesity, hyperlipidemia, and hypertension.  He  reportedly has a history of PTCA approximately 15 years ago at [facility] but he  does not remember the physician's name and there are no catheterization  records available at  [place].  In any event, he stopped smoking for a year, but  then started again.  Has done well up until the last several days when he  had recurrent left arm discomfort and recurrent episodes of substernal chest  pain June 13 and December 11, 2002 prompting visit to [facility].  He  did have partial relief of his chest pain with nitroglycerin and was  admitted.  He was seen in consultation by me.  There were no acute EKG  changes.  His history was felt to be compatible with ischemia, however, and  he was started on heparin, nitroglycerin, and Integrilin and aspirin and  beta blockers.  He was transferred to [facility].  Subsequent serial enzymes and  EKGs have not shown evidence of myocardial damage.  He has elevated uric  acid and lipid and homocysteine levels are pending.  The patient works with  a Nutritional therapist doing manual labor and is a one and a half pack a day  smoker.   Angiographically, he appeared to have 40% smooth narrowing of the ostial and  proximal LAD and IVUS interrogation does not show any high grade stenosis  and moderate noncritical disease in this area as well as segmentally in the  proximal OD.  The etiology of his chest pain is not clear.  It could be  related to his noncritical LAD disease with mild superimposed spasm or it  may be noncoronary and related to esophageal disease such as GERD or  esophageal spasm.   In this setting I would recommend significant lifestyle modifications with  weight reduction,  discontinuation of smoking, exercise program, medical  therapy of mild CAD and presumed hyperlipidemia, continued therapy of his  hypertension.  He is also being assessed for possible metabolic syndrome  with his mild CAD, hypertension, obesity, and cigarette abuse.   CATHETERIZATION DIAGNOSES:  1. Chest pain, etiology not determined.  2. Noncritical proximal left anterior descending, proximal optional diagonal     narrowing.  3. Negative IVUS.  4. Significant left anterior descending or main left disease.  5. Normal left ventricular function, left ventricular hypertrophy.  6. Systemic hypertension, normal renal arteries.  7. Exogenous obesity.  8. Cigarette abuse.  9. Possible metabolic syndrome.  10.      Probable hyperlipidemia.  11.      Possible gastroesophageal reflux disease and/or esophageal spasm.  12.      Hyperuricemia.                                               Richard A. Rollene Fare, M.D.   RAW/MEDQ  D:  12/12/2002  T:  12/12/2002  Job:  [id number]  Jasper Loser. Luan Pulling, M.D.  [place]  [place] [address]  Fax: [phone]   CP Lab   cc:   Jasper Loser. Luan Pulling, M.D.  [place]  [place] [address]  Fax: [phone]   CP Lab

## 2010-11-14 NOTE — H&P (Signed)
   NAMEGOVIND, LYSAGHT                           ACCOUNT NO.:  [id number]   MEDICAL RECORD NO.:  [id number]                   PATIENT TYPE:  INP   LOCATION:                                       FACILITY:   PHYSICIAN:  Edward L. Luan Pulling, M.D.             DATE OF BIRTH:  December 29, 1945   DATE OF ADMISSION:  12/11/2002  DATE OF DISCHARGE:  12/11/2002                                HISTORY & PHYSICAL   HISTORY OF PRESENT ILLNESS:  Mr. Kellison is a 65 year old who had cardiac  disease about 15 years ago and also had a stent placed at that time.  He was  in his usual state of fairly poor health at home when he developed  substernal chest pain which has been present now off and on for the last  four to five days.  He came to the emergency room for evaluation, was seen  in the ER.  He was felt to have angina, was brought into the hospital for  further evaluation.   PAST MEDICAL HISTORY:  1. A previous stent placement about 15 years ago.  2. Hypertension, which is being treated.  3. Hyperlipidemia, which is being treated.   He has recently had a great deal of stress with the death of his mother, and  he has been under a lot of stress at work.   SOCIAL HISTORY:  Otherwise, he smokes about 1-1/2 packs of cigarettes daily.   FAMILY HISTORY:  Positive for coronary artery disease.   REVIEW OF SYSTEMS:  Except as mentioned is negative.   PHYSICAL EXAMINATION:  GENERAL:  A well-developed, well-nourished male, he  is in no acute distress now.  VITAL SIGNS:  Blood pressure 120/70, pulse 80 and regular, respirations 18.  HEENT:  Pupils equal, round, reactive to light and accommodation.  Nose and  throat are clear.  NECK:  Supple without masses.  CHEST:  A S4 gallop with no murmur.  ABDOMEN:  Soft.  LUNGS:  He did not have any wheezing.  EXTREMITIES:  No edema.   LABORATORY DATA:  Did not show evidence of acute myocardial infarction.  EKG  was without acute changes.    HOSPITAL COURSE:  He  was admitted to the ICU, started on heparin.  Seen in  consultation by Dr. Rollene Fare of [facility] and transferred to  [facility] for cardiac catheterization and followup evaluation.   DISCHARGE INSTRUCTIONS:  Depends on his response to treatment.                                               Edward L. Luan Pulling, M.D.    ELH/MEDQ  D:  12/11/2002  T:  12/11/2002  Job:  [id number]

## 2011-05-08 ENCOUNTER — Other Ambulatory Visit (HOSPITAL_COMMUNITY): Payer: Self-pay | Admitting: Pulmonary Disease

## 2011-05-08 ENCOUNTER — Ambulatory Visit (HOSPITAL_COMMUNITY)
Admission: RE | Admit: 2011-05-08 | Discharge: 2011-05-08 | Disposition: A | Discharge status: Home / Self Care | Payer: Managed Care, Other (non HMO) | Source: Ambulatory Visit | Attending: Pulmonary Disease | Admitting: Pulmonary Disease

## 2011-05-08 DIAGNOSIS — R079 Chest pain, unspecified: Secondary | ICD-10-CM | POA: Insufficient documentation

## 2011-05-08 DIAGNOSIS — R059 Cough, unspecified: Secondary | ICD-10-CM | POA: Insufficient documentation

## 2011-05-08 DIAGNOSIS — R05 Cough: Secondary | ICD-10-CM

## 2011-07-06 ENCOUNTER — Ambulatory Visit (HOSPITAL_COMMUNITY)
Admission: RE | Admit: 2011-07-06 | Discharge: 2011-07-06 | Disposition: A | Discharge status: Home / Self Care | Payer: Managed Care, Other (non HMO) | Source: Ambulatory Visit | Attending: Pulmonary Disease | Admitting: Pulmonary Disease

## 2011-07-06 ENCOUNTER — Other Ambulatory Visit (HOSPITAL_COMMUNITY): Payer: Self-pay | Admitting: Pulmonary Disease

## 2011-07-06 DIAGNOSIS — R0602 Shortness of breath: Secondary | ICD-10-CM

## 2011-07-06 DIAGNOSIS — R918 Other nonspecific abnormal finding of lung field: Secondary | ICD-10-CM | POA: Insufficient documentation

## 2011-09-24 DIAGNOSIS — N481 Balanitis: Secondary | ICD-10-CM | POA: Insufficient documentation

## 2011-10-12 DIAGNOSIS — R0683 Snoring: Secondary | ICD-10-CM | POA: Insufficient documentation

## 2011-10-12 DIAGNOSIS — I252 Old myocardial infarction: Secondary | ICD-10-CM | POA: Insufficient documentation

## 2011-10-12 DIAGNOSIS — Z8679 Personal history of other diseases of the circulatory system: Secondary | ICD-10-CM | POA: Insufficient documentation

## 2012-04-21 ENCOUNTER — Other Ambulatory Visit (HOSPITAL_COMMUNITY): Payer: Self-pay | Admitting: Pulmonary Disease

## 2012-04-21 DIAGNOSIS — R1030 Lower abdominal pain, unspecified: Secondary | ICD-10-CM

## 2012-04-21 DIAGNOSIS — R198 Other specified symptoms and signs involving the digestive system and abdomen: Secondary | ICD-10-CM

## 2012-04-26 ENCOUNTER — Inpatient Hospital Stay (HOSPITAL_COMMUNITY): Admission: RE | Admit: 2012-04-26 | Payer: Managed Care, Other (non HMO) | Source: Ambulatory Visit

## 2012-04-26 ENCOUNTER — Ambulatory Visit (HOSPITAL_COMMUNITY): Payer: Managed Care, Other (non HMO) | Attending: Pulmonary Disease

## 2012-05-12 ENCOUNTER — Ambulatory Visit (HOSPITAL_COMMUNITY)
Admission: RE | Admit: 2012-05-12 | Discharge: 2012-05-12 | Disposition: A | Discharge status: Home / Self Care | Payer: Managed Care, Other (non HMO) | Source: Ambulatory Visit | Attending: Pulmonary Disease | Admitting: Pulmonary Disease

## 2012-05-12 DIAGNOSIS — R160 Hepatomegaly, not elsewhere classified: Secondary | ICD-10-CM | POA: Insufficient documentation

## 2012-05-12 DIAGNOSIS — R1030 Lower abdominal pain, unspecified: Secondary | ICD-10-CM

## 2012-05-12 DIAGNOSIS — R109 Unspecified abdominal pain: Secondary | ICD-10-CM | POA: Insufficient documentation

## 2012-05-12 DIAGNOSIS — K7689 Other specified diseases of liver: Secondary | ICD-10-CM | POA: Insufficient documentation

## 2012-05-12 DIAGNOSIS — R198 Other specified symptoms and signs involving the digestive system and abdomen: Secondary | ICD-10-CM | POA: Insufficient documentation

## 2012-05-16 ENCOUNTER — Ambulatory Visit (HOSPITAL_COMMUNITY)
Admission: RE | Admit: 2012-05-16 | Discharge: 2012-05-16 | Disposition: A | Discharge status: Home / Self Care | Payer: Managed Care, Other (non HMO) | Source: Ambulatory Visit | Attending: Pulmonary Disease | Admitting: Pulmonary Disease

## 2012-05-16 DIAGNOSIS — R011 Cardiac murmur, unspecified: Secondary | ICD-10-CM | POA: Insufficient documentation

## 2012-05-16 DIAGNOSIS — R0989 Other specified symptoms and signs involving the circulatory and respiratory systems: Secondary | ICD-10-CM | POA: Insufficient documentation

## 2012-05-16 DIAGNOSIS — I517 Cardiomegaly: Secondary | ICD-10-CM

## 2012-05-16 DIAGNOSIS — R609 Edema, unspecified: Secondary | ICD-10-CM | POA: Insufficient documentation

## 2012-05-16 DIAGNOSIS — R0609 Other forms of dyspnea: Secondary | ICD-10-CM | POA: Insufficient documentation

## 2012-05-16 DIAGNOSIS — I359 Nonrheumatic aortic valve disorder, unspecified: Secondary | ICD-10-CM | POA: Insufficient documentation

## 2012-05-16 NOTE — Progress Notes (Signed)
*  PRELIMINARY RESULTS* Echocardiogram 2D Echocardiogram has been performed.  [name] 05/16/2012, 10:59 AM

## 2012-06-03 ENCOUNTER — Ambulatory Visit (INDEPENDENT_AMBULATORY_CARE_PROVIDER_SITE_OTHER): Payer: Managed Care, Other (non HMO) | Admitting: Internal Medicine

## 2012-06-03 ENCOUNTER — Encounter: Payer: Self-pay | Admitting: Internal Medicine

## 2012-06-03 VITALS — BP 110/60 | HR 80 | Ht 70.0 in | Wt 300.4 lb

## 2012-06-03 DIAGNOSIS — Z136 Encounter for screening for cardiovascular disorders: Secondary | ICD-10-CM

## 2012-06-03 DIAGNOSIS — R0602 Shortness of breath: Secondary | ICD-10-CM

## 2012-06-03 LAB — CBC WITH DIFFERENTIAL/PLATELET
Basophils Relative: 1 % (ref 0–1)
Eosinophils Absolute: 0.4 10*3/uL (ref 0.0–0.7)
MCH: 31 pg (ref 26.0–34.0)
MCHC: 33.9 g/dL (ref 30.0–36.0)
Neutrophils Relative %: 53 % (ref 43–77)
Platelets: 248 10*3/uL (ref 150–400)
RDW: 13.8 % (ref 11.5–15.5)

## 2012-06-03 NOTE — Progress Notes (Signed)
HPI Patient is a 66 yo who is followed by Dr Luan Pulling He recently had an echo on 11/18 This showed LVEF of 65 to 70%, moderate aortic stenosis with peak and mean gradients of 49 and 32 mm HG.  Mean gradient in 2010 was 17 mm Hg.  Patient complains that he cant catch breath.  Winded  Gives out  Weight has gone up 60# (One year ago doubled thyroid supplement and wt went up)  [facility] Majestic for physical in September  COntinued symptoms  QUit tobacco in June 2012  Prior had smoked since teens  Gets chest tight when gets SOB.  Had PTCA at [facility] in 1980s (question Dr. [name])  Had repeat cath 8 years ago Nothing done.  Had L CEA by Dr. Trula Slade.     O2 at home 89-95% No Known Allergies  Current Outpatient Prescriptions  Medication Sig Dispense Refill  . aspirin 325 MG tablet Take 325 mg by mouth daily.      . cholecalciferol (VITAMIN D) 1000 UNITS tablet Take 2,000 Units by mouth daily.      . Cyanocobalamin (B-12) 2500 MCG TABS Take 2,500 mcg by mouth daily.      . furosemide (LASIX) 40 MG tablet Take 40 mg by mouth daily.      . Multiple Vitamin (MULTIVITAMIN) tablet Take 1 tablet by mouth daily.      Marland Kitchen olmesartan (BENICAR) 40 MG tablet Take 40 mg by mouth daily.      . Omega-3 Fatty Acids (FISH OIL) 1200 MG CAPS Take 2,400 mg by mouth daily.      . potassium chloride SA (K-DUR,KLOR-CON) 20 MEQ tablet Take 20 mEq by mouth daily.       . sertraline (ZOLOFT) 100 MG tablet Take 100 mg by mouth daily.      . simvastatin (ZOCOR) 40 MG tablet Take 40 mg by mouth every evening.      Marland Kitchen SPIRIVA HANDIHALER 18 MCG inhalation capsule Place 18 mcg into inhaler and inhale daily.       Marland Kitchen SYNTHROID 125 MCG tablet Take 250 mcg by mouth daily.         No past medical history on file.  No past surgical history on file.  No family history on file.  History   Social History  . Marital Status: Married    Spouse Name: N/A    Number of Children: N/A  . Years of Education: N/A   Occupational History   . Not on file.   Social History Main Topics  . Smoking status: Former Smoker -- 3.0 packs/day for 50 years    Types: Cigarettes    Quit date: 12/13/2010  . Smokeless tobacco: Not on file     Comment: Electronic cigarette  . Alcohol Use: Yes     Comment: Rare  . Drug Use: No  . Sexually Active: Not on file   Other Topics Concern  . Not on file   Social History Narrative  . No narrative on file    Review of Systems:  All systems reviewed.  They are negative to the above problem except as previously stated.  Vital Signs: BP 110/60  Pulse 80  Ht 5\' 10"  (1.778 m)  Wt 300 lb 6.4 oz (136.261 kg)  BMI 43.10 kg/m2  Physical Exam Patient is an obese 66 yo in NAD HEENT:  Normocephalic, atraumatic. EOMI, PERRLA.  Neck: JVP is normal.  CEA scar L  Lungs: clear to auscultation. No rales no wheezes.  Heart: Regular rate and rhythm. Normal S1, S2. No S3.   Gr III/VI systolic murmur LSB PMI not displaced.  Abdomen:  Supple, nontender. Normal bowel sounds. No masses. No hepatomegaly.  Extremities:   Good distal pulses throughout. No lower extremity edema.  Musculoskeletal :moving all extremities.  Neuro:   alert and oriented x3.  CN II-XII grossly intact.  EKG  NSR  80 bpm.   Assessment and Plan:  1.  Dyspnea.  Concerning.  He is morbidly obese.  Also with moderate AS  I have reviewed echo.  He has mild diastolic dysfunction.  Has COPD with sats that are low at times per his report. Still with CAD history I feel obligated to define since his symptoms are so marked.   I would recomm R and L heart cath to define anatomy and pressures.  Will get labs today and schedule  2.  AS  Valve is narrower than 3 years ago  I do not think it is at point needing surgery  3.  COPD  May need cardiopulm rehab when w/u done  4.  HL  Keep on statin.  5.  HTN  FOllow.

## 2012-06-03 NOTE — Patient Instructions (Addendum)
Have lab work today at [facility], Dr.Ross will look at results then decide about a cardiac cath.

## 2012-06-04 LAB — TSH: TSH: 0.07 u[IU]/mL — ABNORMAL LOW (ref 0.350–4.500)

## 2012-06-09 ENCOUNTER — Encounter: Payer: Self-pay | Admitting: *Deleted

## 2012-06-09 NOTE — Addendum Note (Signed)
Addended by: Shara Blazing A on: 06/09/2012 04:32 PM   Modules accepted: Orders

## 2012-06-10 ENCOUNTER — Telehealth: Payer: Self-pay | Admitting: Internal Medicine

## 2012-06-10 LAB — CBC
Hemoglobin: 13 g/dL (ref 13.0–17.0)
MCH: 30.4 pg (ref 26.0–34.0)
MCV: 90.9 fL (ref 78.0–100.0)
RBC: 4.27 MIL/uL (ref 4.22–5.81)
WBC: 8.8 10*3/uL (ref 4.0–10.5)

## 2012-06-10 LAB — BASIC METABOLIC PANEL
CO2: 28 mEq/L (ref 19–32)
Calcium: 9.5 mg/dL (ref 8.4–10.5)
Chloride: 105 mEq/L (ref 96–112)
Creat: 1.45 mg/dL — ABNORMAL HIGH (ref 0.50–1.35)
Glucose, Bld: 149 mg/dL — ABNORMAL HIGH (ref 70–99)
Sodium: 141 mEq/L (ref 135–145)

## 2012-06-10 NOTE — Telephone Encounter (Signed)
Spoke to pt wife and she advised that he only received labs sheets and not instructions and that he did have his labs drawn today, apologized for inconvience however the pt will need to come back into the office to receive the verbal instructions for the R/L Cath, pt wife understood and will advise pt to come back in office at his earliest convience prior to 06-16-12 cath test, pt wife understood, will advise pt when he arrives

## 2012-06-10 NOTE — Telephone Encounter (Signed)
Patient's wife states that he came by to get lab order sheet and was supposed to get instructions for Cath but was not given any. Please return call. / tg

## 2012-06-16 ENCOUNTER — Encounter (HOSPITAL_BASED_OUTPATIENT_CLINIC_OR_DEPARTMENT_OTHER)
Admission: RE | Disposition: A | Discharge status: Home / Self Care | Payer: Self-pay | Source: Ambulatory Visit | Attending: Cardiology

## 2012-06-16 ENCOUNTER — Inpatient Hospital Stay (HOSPITAL_BASED_OUTPATIENT_CLINIC_OR_DEPARTMENT_OTHER)
Admission: RE | Admit: 2012-06-16 | Discharge: 2012-06-16 | Disposition: A | Discharge status: Home / Self Care | Payer: Managed Care, Other (non HMO) | Source: Ambulatory Visit | Attending: Cardiology | Admitting: Cardiology

## 2012-06-16 DIAGNOSIS — E669 Obesity, unspecified: Secondary | ICD-10-CM | POA: Insufficient documentation

## 2012-06-16 DIAGNOSIS — R0989 Other specified symptoms and signs involving the circulatory and respiratory systems: Secondary | ICD-10-CM | POA: Insufficient documentation

## 2012-06-16 DIAGNOSIS — R0609 Other forms of dyspnea: Secondary | ICD-10-CM | POA: Insufficient documentation

## 2012-06-16 DIAGNOSIS — I2510 Atherosclerotic heart disease of native coronary artery without angina pectoris: Secondary | ICD-10-CM

## 2012-06-16 DIAGNOSIS — J4489 Other specified chronic obstructive pulmonary disease: Secondary | ICD-10-CM | POA: Insufficient documentation

## 2012-06-16 DIAGNOSIS — J449 Chronic obstructive pulmonary disease, unspecified: Secondary | ICD-10-CM | POA: Insufficient documentation

## 2012-06-16 DIAGNOSIS — I359 Nonrheumatic aortic valve disorder, unspecified: Secondary | ICD-10-CM | POA: Insufficient documentation

## 2012-06-16 LAB — POCT I-STAT 3, ART BLOOD GAS (G3+)
pCO2 arterial: 42.3 mmHg (ref 35.0–45.0)
pH, Arterial: 7.328 — ABNORMAL LOW (ref 7.350–7.450)
pO2, Arterial: 95 mmHg (ref 80.0–100.0)

## 2012-06-16 LAB — POCT I-STAT 3, VENOUS BLOOD GAS (G3P V)
Bicarbonate: 22.3 mEq/L (ref 20.0–24.0)
O2 Saturation: 61 %
TCO2: 24 mmol/L (ref 0–100)
pCO2, Ven: 50.2 mmHg — ABNORMAL HIGH (ref 45.0–50.0)
pH, Ven: 7.254 (ref 7.250–7.300)

## 2012-06-16 SURGERY — JV LEFT HEART CATHETERIZATION WITH CORONARY ANGIOGRAM
Anesthesia: Moderate Sedation

## 2012-06-16 MED ORDER — SODIUM CHLORIDE 0.9 % IV SOLN
INTRAVENOUS | Status: DC
  Administered 2012-06-16: 10:00:00 via INTRAVENOUS

## 2012-06-16 MED ORDER — ASPIRIN 81 MG PO CHEW
324.0000 mg | CHEWABLE_TABLET | ORAL | Status: AC
  Administered 2012-06-16: 324 mg via ORAL

## 2012-06-16 MED ORDER — ONDANSETRON HCL 4 MG/2ML IJ SOLN
4.0000 mg | Freq: Four times a day (QID) | INTRAMUSCULAR | Status: DC | PRN
Start: 2012-06-16 — End: 2012-06-16

## 2012-06-16 MED ORDER — ACETAMINOPHEN 325 MG PO TABS: 650.0000 mg | ORAL_TABLET | ORAL | Status: DC | PRN

## 2012-06-16 MED ORDER — SODIUM CHLORIDE 0.9 % IV SOLN: 250.0000 mL | INTRAVENOUS | Status: DC | PRN

## 2012-06-16 MED ORDER — SODIUM CHLORIDE 0.9 % IV SOLN: 1.0000 mL/kg/h | INTRAVENOUS | Status: DC

## 2012-06-16 MED ORDER — SODIUM CHLORIDE 0.9 % IJ SOLN: 3.0000 mL | INTRAMUSCULAR | Status: DC | PRN

## 2012-06-16 MED ORDER — DIAZEPAM 5 MG PO TABS
5.0000 mg | ORAL_TABLET | Freq: Once | ORAL | Status: AC
  Administered 2012-06-16: 5 mg via ORAL

## 2012-06-16 MED ORDER — SODIUM CHLORIDE 0.9 % IJ SOLN: 3.0000 mL | Freq: Two times a day (BID) | INTRAMUSCULAR | Status: DC

## 2012-06-16 NOTE — H&P (View-Only) (Signed)
HPI Patient is a 66 yo who is followed by Dr Luan Pulling He recently had an echo on 11/18 This showed LVEF of 65 to 70%, moderate aortic stenosis with peak and mean gradients of 49 and 32 mm HG.  Mean gradient in 2010 was 17 mm Hg.  Patient complains that he cant catch breath.  Winded  Gives out  Weight has gone up 60# (One year ago doubled thyroid supplement and wt went up)  Martin Majestic for physical in September  COntinued symptoms  QUit tobacco in June 2012  Prior had smoked since teens  Gets chest tight when gets SOB.  Had PTCA at [facility] in [facility]s (question Dr. [name])  Had repeat cath 8 years ago Nothing done.  Had L CEA by Dr. Trula Slade.     O2 at home 89-95% No Known Allergies  Current Outpatient Prescriptions  Medication Sig Dispense Refill  . aspirin 325 MG tablet Take 325 mg by mouth daily.      . cholecalciferol (VITAMIN D) 1000 UNITS tablet Take 2,000 Units by mouth daily.      . Cyanocobalamin (B-12) 2500 MCG TABS Take 2,500 mcg by mouth daily.      . furosemide (LASIX) 40 MG tablet Take 40 mg by mouth daily.      . Multiple Vitamin (MULTIVITAMIN) tablet Take 1 tablet by mouth daily.      Marland Kitchen olmesartan (BENICAR) 40 MG tablet Take 40 mg by mouth daily.      . Omega-3 Fatty Acids (FISH OIL) 1200 MG CAPS Take 2,400 mg by mouth daily.      . potassium chloride SA (K-DUR,KLOR-CON) 20 MEQ tablet Take 20 mEq by mouth daily.       . sertraline (ZOLOFT) 100 MG tablet Take 100 mg by mouth daily.      . simvastatin (ZOCOR) 40 MG tablet Take 40 mg by mouth every evening.      Marland Kitchen SPIRIVA HANDIHALER 18 MCG inhalation capsule Place 18 mcg into inhaler and inhale daily.       Marland Kitchen SYNTHROID 125 MCG tablet Take 250 mcg by mouth daily.         No past medical history on file.  No past surgical history on file.  No family history on file.  History   Social History  . Marital Status: Married    Spouse Name: N/A    Number of Children: N/A  . Years of Education: N/A   Occupational History   . Not on file.   Social History Main Topics  . Smoking status: Former Smoker -- 3.0 packs/day for 50 years    Types: Cigarettes    Quit date: 12/13/2010  . Smokeless tobacco: Not on file     Comment: Electronic cigarette  . Alcohol Use: Yes     Comment: Rare  . Drug Use: No  . Sexually Active: Not on file   Other Topics Concern  . Not on file   Social History Narrative  . No narrative on file    Review of Systems:  All systems reviewed.  They are negative to the above problem except as previously stated.  Vital Signs: BP 110/60  Pulse 80  Ht 5\' 10"  (1.778 m)  Wt 300 lb 6.4 oz (136.261 kg)  BMI 43.10 kg/m2  Physical Exam Patient is an obese 65 yo in NAD HEENT:  Normocephalic, atraumatic. EOMI, PERRLA.  Neck: JVP is normal.  CEA scar L  Lungs: clear to auscultation. No rales no wheezes.  Heart: Regular rate and rhythm. Normal S1, S2. No S3.   Gr III/VI systolic murmur LSB PMI not displaced.  Abdomen:  Supple, nontender. Normal bowel sounds. No masses. No hepatomegaly.  Extremities:   Good distal pulses throughout. No lower extremity edema.  Musculoskeletal :moving all extremities.  Neuro:   alert and oriented x3.  CN II-XII grossly intact.  EKG  NSR  80 bpm.   Assessment and Plan:  1.  Dyspnea.  Concerning.  He is morbidly obese.  Also with moderate AS  I have reviewed echo.  He has mild diastolic dysfunction.  Has COPD with sats that are low at times per his report. Still with CAD history I feel obligated to define since his symptoms are so marked.   I would recomm R and L heart cath to define anatomy and pressures.  Will get labs today and schedule  2.  AS  Valve is narrower than 3 years ago  I do not think it is at point needing surgery  3.  COPD  May need cardiopulm rehab when w/u done  4.  HL  Keep on statin.  5.  HTN  FOllow.

## 2012-06-16 NOTE — CV Procedure (Signed)
   Cardiac Catheterization Procedure Note  Name: [name] MRN: [id number] DOB: Jan 04, 1946  Procedure: Right Heart Cath, Left Heart Cath, Selective Coronary Angiography, LV angiography  Indication: 66 year old white male with history of obesity, COPD, and aortic stenosis presents with worsening symptoms of dyspnea. Echocardiogram demonstrates moderate aortic stenosis.   Procedural Details: The right groin was prepped, draped, and anesthetized with 1% lidocaine. Using the modified Seldinger technique a 5 French sheath was placed in the right femoral artery and a 7 French sheath was placed in the right femoral vein. A Swan-Ganz catheter was used for the right heart catheterization. Standard protocol was followed for recording of right heart pressures and sampling of oxygen saturations. Fick cardiac output was calculated. Standard Judkins catheters were used for selective coronary angiography and left ventriculography. There were no immediate procedural complications. The patient was transferred to the post catheterization recovery area for further monitoring.  Procedural Findings: Hemodynamics RA 13/9 with a mean of 9 mmHg RV 34/10 mmHg PA 36/18 with a mean of 26 mmHg PCWP 19/17 with a mean of 16 mmHg LV 140/21 mmHg AO 107/57 with a mean of 77 mmHg  Mean aortic valve gradient is 30 mmHg. Aortic valve area is 1 cm square with a valve index of 0.43 There is no significant mitral valve gradient  Oxygen saturations: PA 61% AO 97%  Cardiac Output (Fick) 4.9 L per minute  Cardiac Index (Fick) 2 L per minute per meter square   Coronary angiography: Coronary dominance: right  Left mainstem: There is 40% stenosis in the distal left main.  Left anterior descending (LAD): The LAD is a large vessel which extends to the apex. It gives rise to a moderately large first diagonal branch. There is 40-50% narrowing of the ostium of the LAD.  Left circumflex (LCx): The left circumflex is a  dominant vessel. It has a 90% ostial stenosis. The remainder the vessel is without significant disease.  Right coronary artery (RCA): This is a small nondominant vessel and is normal.  Left ventriculography: Not performed due to to renal insufficiency  Final Conclusions:   1. 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 2. Severe aortic stenosis 3. Normal right heart pressures.  Recommendations: Patient will be referred to CT surgery for consideration of aortic valve replacement and bypass surgery.   Collier Salina [facility] 06/16/2012, 12:10 PM

## 2012-06-16 NOTE — Progress Notes (Signed)
Bedrest begins @ 1230, tegaderm dressing applied

## 2012-06-16 NOTE — Interval H&P Note (Signed)
History and Physical Interval Note:  06/16/2012 10:25 AM  [name]  has presented today for surgery, with the diagnosis of chest pain and aortic stenosis.  The various methods of treatment have been discussed with the patient and family. After consideration of risks, benefits and other options for treatment, the patient has consented to  Procedure(s) (LRB) with comments: JV LEFT HEART CATHETERIZATION WITH CORONARY ANGIOGRAM (N/A) as a surgical intervention .  The patient's history has been reviewed, patient examined, no change in status, stable for surgery.  I have reviewed the patient's chart and labs.  Questions were answered to the patient's satisfaction.     Collier Salina [facility] 06/16/2012 10:25 AM

## 2012-06-23 ENCOUNTER — Encounter: Payer: Self-pay | Admitting: Vascular Surgery

## 2012-06-27 ENCOUNTER — Encounter: Payer: Self-pay | Admitting: Surgery

## 2012-06-27 ENCOUNTER — Other Ambulatory Visit: Payer: Self-pay | Admitting: *Deleted

## 2012-06-27 ENCOUNTER — Institutional Professional Consult (permissible substitution) (INDEPENDENT_AMBULATORY_CARE_PROVIDER_SITE_OTHER): Payer: Managed Care, Other (non HMO) | Admitting: Surgery

## 2012-06-27 ENCOUNTER — Other Ambulatory Visit: Payer: Self-pay | Admitting: Surgery

## 2012-06-27 VITALS — BP 138/79 | HR 81 | Temp 97.4°F | Resp 18 | Ht 70.0 in | Wt 295.0 lb

## 2012-06-27 DIAGNOSIS — I350 Nonrheumatic aortic (valve) stenosis: Secondary | ICD-10-CM

## 2012-06-27 DIAGNOSIS — E079 Disorder of thyroid, unspecified: Secondary | ICD-10-CM | POA: Insufficient documentation

## 2012-06-27 DIAGNOSIS — R011 Cardiac murmur, unspecified: Secondary | ICD-10-CM | POA: Insufficient documentation

## 2012-06-27 DIAGNOSIS — M1990 Unspecified osteoarthritis, unspecified site: Secondary | ICD-10-CM | POA: Insufficient documentation

## 2012-06-27 DIAGNOSIS — R55 Syncope and collapse: Secondary | ICD-10-CM | POA: Insufficient documentation

## 2012-06-27 DIAGNOSIS — I2510 Atherosclerotic heart disease of native coronary artery without angina pectoris: Secondary | ICD-10-CM

## 2012-06-27 DIAGNOSIS — F329 Major depressive disorder, single episode, unspecified: Secondary | ICD-10-CM | POA: Insufficient documentation

## 2012-06-27 DIAGNOSIS — N200 Calculus of kidney: Secondary | ICD-10-CM | POA: Insufficient documentation

## 2012-06-27 DIAGNOSIS — F32A Depression, unspecified: Secondary | ICD-10-CM | POA: Insufficient documentation

## 2012-06-27 DIAGNOSIS — I359 Nonrheumatic aortic valve disorder, unspecified: Secondary | ICD-10-CM

## 2012-06-27 DIAGNOSIS — I10 Essential (primary) hypertension: Secondary | ICD-10-CM | POA: Insufficient documentation

## 2012-06-27 DIAGNOSIS — J449 Chronic obstructive pulmonary disease, unspecified: Secondary | ICD-10-CM

## 2012-06-27 NOTE — Progress Notes (Signed)
[address]            [place] [address]          [phone]      PCP is Alonza Bogus, MD Referring Provider is Fay Records, MD  Chief Complaint  Patient presents with  . Coronary Artery Disease    eval and treat....cathed 06/16/12...2D ECHO 05/16/12  . Aortic Stenosis    HPI:  [name] is a 66 year old gentleman with a history of hypertension, non-obstructive coronary disease, previous heavy smoking with COPD, and morbid obesity who reports a 6-12 month history of progressively worsening dyspnea on exertion, exertional chest tightness, and dizziness with bending over. He had a 2-D echocardiogram done in November which showed moderate aortic stenosis with a valve area by continuity equation of 2.19 cm and a valve area of 1.27 cm by pressure half-time. There is mild aortic annular calcification and the aortic valve leaflets were moderately thickened and calcified. Compared to his previous study of 09/24/2008 there was modest progression of his aortic valve disease. Due to his progressive symptoms he underwent cardiac catheterization which showed normal right heart pressures. The mean aortic valve gradient was 30 mm mercury with a calculated aortic valve area of 1 cm with a valve index of 0.43. Cardiac index was 2 L per minute per meter squared. Coronary angiography showed a 90% ostial dominant left circumflex stenosis. The LAD had mild narrowing of the ostium that looked to be about 40% at the most. The right coronary artery is a small nondominant vessel without disease. His left ventricular ejection fraction by echocardiogram was 65-70%.  Past Medical History  Diagnosis Date  . Hypertension   . Thyroid disease   . Arthritis   . COPD (chronic obstructive pulmonary disease)   . Heart murmur   . Aortic stenosis   . CAD (coronary artery disease)   . Syncope   . Depression   . Kidney stones     Past Surgical History  Procedure Date  .  Joint replacement:  Bilateral knee replacement in 2010   . Angiopasty   . Carotid endarterectomy on left by Dr. Trula Slade 4 years ago   . Cardiac catheterization 2004 without significant obstructive disease     Family History  Problem Relation Age of Onset  . Heart disease Mother   . Heart disease Father   . Hypertension Father     Social History History  Substance Use Topics  . Smoking status: Former Smoker -- 3.0 packs/day for 50 years    Types: Cigarettes    Quit date: 12/13/2010  . Smokeless tobacco: Not on file     Comment: Electronic cigarette...USES INFREQUENTLY NOW  . Alcohol Use: Yes     Comment: Rare  Drinks 18  20 oz. Bottles of regular Sun Drop soda a week.  Current Outpatient Prescriptions  Medication Sig Dispense Refill  . albuterol (PROVENTIL HFA;VENTOLIN HFA) 108 (90 BASE) MCG/ACT inhaler Inhale 2 puffs into the lungs every 6 (six) hours as needed.      Marland Kitchen aspirin 325 MG tablet Take 325 mg by mouth daily.      . cholecalciferol (VITAMIN D) 1000 UNITS tablet Take 2,000 Units by mouth daily.      . Cyanocobalamin (B-12) 2500 MCG TABS Take 2,500 mcg by mouth daily.      . furosemide (LASIX) 40 MG tablet Take 20  mg by mouth daily.       . Multiple Vitamin (MULTIVITAMIN) tablet Take 1 tablet by mouth daily.      Marland Kitchen olmesartan (BENICAR) 40 MG tablet Take 20 mg by mouth daily.       . Omega-3 Fatty Acids (FISH OIL) 1200 MG CAPS Take 2,400 mg by mouth daily.      . potassium chloride SA (K-DUR,KLOR-CON) 20 MEQ tablet Take 20 mEq by mouth daily.       . sertraline (ZOLOFT) 100 MG tablet Take 100 mg by mouth daily.      . simvastatin (ZOCOR) 40 MG tablet Take 40 mg by mouth every evening.      Marland Kitchen SPIRIVA HANDIHALER 18 MCG inhalation capsule Place 18 mcg into inhaler and inhale daily.       Marland Kitchen SYNTHROID 125 MCG tablet Take 250 mcg by mouth daily.         No Known Allergies  Review of Systems  Constitutional: Positive for activity change, fatigue and unexpected weight  change. Negative for fever, chills, diaphoresis and appetite change.  HENT: Negative.   Eyes: Negative.   Respiratory: Positive for cough, chest tightness and shortness of breath.        Exertional such as walking  Cardiovascular: Positive for chest pain and leg swelling. Negative for palpitations.  Gastrointestinal:       Long hx of bleeding hemorrhoids worse when on coumadin after knee replacements.  Genitourinary: Negative.   Musculoskeletal: Positive for arthralgias and gait problem.  Skin: Negative.   Neurological: Positive for dizziness.       Numbness in hands  Hematological: Negative.   Psychiatric/Behavioral:       Hx of depression and anxiety.    BP 138/79  Pulse 81  Temp 97.4 F (36.3 C) (Oral)  Resp 18  Ht 5\' 10"  (1.778 m)  Wt 295 lb (133.811 kg)  BMI 42.33 kg/m2  SpO2 92% Physical Exam  Constitutional: He is oriented to person, place, and time.       Obese gentleman in no distress.  HENT:  Head: Normocephalic and atraumatic.  Mouth/Throat: Oropharynx is clear and moist.  Eyes: Conjunctivae normal and EOM are normal. Pupils are equal, round, and reactive to light.  Neck: Normal range of motion. No JVD present. No tracheal deviation present. No thyromegaly present.  Cardiovascular: Normal rate, regular rhythm and intact distal pulses.   Murmur heard.      3/6 systolic murmur over aorta radiating into both sides of neck.  Pulmonary/Chest: Effort normal and breath sounds normal. No respiratory distress. He has no wheezes. He has no rales.  Abdominal: Soft. Bowel sounds are normal. He exhibits no distension and no mass. There is no tenderness.       obese  Musculoskeletal: Normal range of motion. He exhibits no edema.  Lymphadenopathy:    He has no cervical adenopathy.  Neurological: He is alert and oriented to person, place, and time. He has normal strength. No cranial nerve deficit or sensory deficit.  Skin: Skin is warm and dry.  Psychiatric: He has a normal  mood and affect.     Diagnostic Tests:  ------------------------------------------------------------ Transthoracic Echocardiography  Patient:    [name], [name] MR #:       [id number] Study Date: 05/16/2012 Gender:     M Age:        72 Height:     177.8cm Weight:     135.2kg BSA:  2.40m^2 Pt. Status: Room:    SONOGRAPHER  Konrad Dolores  ATTENDING    Haywood Lasso  REFERRING    Alonza Bogus  PERFORMING   Aneta Mins Penn cc:  ------------------------------------------------------------ LV EF: 65% -   70%  ------------------------------------------------------------ Indications:      Edema 782.3.  ------------------------------------------------------------ History:   PMH:   Dyspnea and murmur.  ------------------------------------------------------------ Study Conclusions  - Left ventricle: The cavity size was normal. There was mild   concentric hypertrophy. Systolic function was vigorous.   The estimated ejection fraction was in the range of 65% to   70%. Wall motion was normal; there were no regional wall   motion abnormalities. - Aortic valve: Mildly calcified annulus. Moderately   thickened, moderately calcified leaflets. Cusp separation   was moderately reduced. There was moderate stenosis. - Mitral valve: Moderately to severely calcified annulus.   Valve area by pressure half-time: 1.27cm^2. Valve area by   continuity equation (using LVOT flow): 2.19cm^2. - Left atrium: The atrium was mildly dilated. - Atrial septum: No defect or patent foramen ovale was   identified. Impressions:  - Compared to the prior study performed 09/24/2008, there has   been modest progression of AV disease.  ------------------------------------------------------------ Labs, prior tests, procedures, and surgery: angioplasty in 1969 and 2005          Transthoracic echocardiography.  M-mode, complete 2D, spectral Doppler, and color  Doppler.  Height:  Height: 177.8cm. Height: 70in. Weight:  Weight: 135.2kg. Weight: 297.4lb.  Body mass index:  BMI: 42.8kg/m^2.  Body surface area:    BSA: 2.56m^2. Patient status:  Outpatient.  Location:  Echo laboratory.   ------------------------------------------------------------  ------------------------------------------------------------ Left ventricle:  The cavity size was normal. There was mild concentric hypertrophy. Systolic function was vigorous. The estimated ejection fraction was in the range of 65% to 70%. Wall motion was normal; there were no regional wall motion abnormalities.  ------------------------------------------------------------ Aortic valve:   Mildly calcified annulus. Moderately thickened, moderately calcified leaflets. Cusp separation was moderately reduced.  Doppler:   There was moderate stenosis.    No regurgitation.  ------------------------------------------------------------ Aorta:  The aorta was mildly calcified. Aortic root: The aortic root was normal in size. Ascending aorta: The ascending aorta was normal in size.  ------------------------------------------------------------ Mitral valve:   Moderately to severely calcified annulus. Leaflet separation was normal.  Doppler:  Transvalvular velocity was within the normal range. There was no evidence for stenosis.  No regurgitation.    Valve area by pressure half-time: 1.27cm^2. Indexed valve area by pressure half-time: 0.51cm^2/m^2. Valve area by continuity equation (using LVOT flow): 2.19cm^2. Indexed valve area by continuity equation (using LVOT flow): 0.89cm^2/m^2.    Mean gradient: 69mm Hg (D). Peak gradient: 34mm Hg (D).  ------------------------------------------------------------ Left atrium:  The atrium was mildly dilated.  ------------------------------------------------------------ Atrial septum:  No defect or patent foramen ovale  was identified.  ------------------------------------------------------------ Right ventricle:  The cavity size was normal. Wall thickness was normal. Systolic function was normal.  ------------------------------------------------------------ Pulmonic valve:    Structurally normal valve.   Cusp separation was normal.  Doppler:  Transvalvular velocity was within the normal range.  No regurgitation.  ------------------------------------------------------------ Tricuspid valve:   Structurally normal valve.   Leaflet separation was normal.  Doppler:  Transvalvular velocity was within the normal range.  Trivial regurgitation.  ------------------------------------------------------------ Pulmonary artery:   The main pulmonary artery was normal-sized.  Systolic pressure could not be accurately estimated.  ------------------------------------------------------------ Right atrium:  The atrium was normal in size.  ------------------------------------------------------------ Pericardium:  There was no pericardial effusion.  ------------------------------------------------------------ Systemic veins:  Not visualized.  ------------------------------------------------------------ Post procedure conclusions Ascending Aorta:  - The aorta was mildly calcified.  ------------------------------------------------------------  2D measurements        Normal  Doppler measurements   Normal Left ventricle                 LVOT LVID ED,   49.8 mm     43-52   Peak vel,   152 cm/s   ------ chord,                         S PLAX                           VTI, S     40.9 cm     ------ LVID ES,   37.1 mm     23-38   Peak          9 mm Hg  ------ chord,                         gradient, PLAX                           S FS, chord,   26 %      >29     Aortic valve PLAX                           VTI, S     57.9 cm     ------ LVPW, ED   14.8 mm     ------  Mitral valve IVS/LVPW   1.03        <1.3    Peak  E vel 91.9 cm/s   ------ ratio, ED                      Peak A vel  160 cm/s   ------ Ventricular septum             Mean vel,   102 cm/s   ------ IVS, ED    15.3 mm     ------  D LVOT                           Decelerati  479 ms     150-23 Diam, S      20 mm     ------  on time                0 Area       3.14 cm^2   ------  Pressure    173 ms     ------ Aorta                          half-time Root diam,   32 mm     ------  Mean          5 mm Hg  ------ ED                             gradient, Left atrium  D AP dim       49 mm     ------  Peak         12 mm Hg  ------ AP dim     1.98 cm/m^2 <2.2    gradient, index                          D                                Peak E/A    0.6        ------ M-mode measurements    Normal  ratio Aorta                          Area (PHT) 1.27 cm^2   ------ Root diam,   30 mm     20-37   Area index 0.51 cm^2/m ------ ED                             (PHT)           ^2 Left atrium                    Area       2.19 cm^2   ------ AP dim, ES   58 mm     19-40   (LVOT) AP dim     2.35 cm/m^2 <2.2    continuity index, ES                      Area index 0.89 cm^2/m ------ LA/Ao root 1.93        ------  (LVOT           ^2 ratio                          cont)                                Annulus    58.6 cm     ------                                VTI   ------------------------------------------------------------ Prepared and Electronically Authenticated by  Jacqulyn Ducking 2013-11-18T19:[id number]    Cardiac Cath:  Procedural Findings: Hemodynamics RA 13/9 with a mean of 9 mmHg RV 34/10 mmHg PA 36/18 with a mean of 26 mmHg PCWP 19/17 with a mean of 16 mmHg LV 140/21 mmHg AO 107/57 with a mean of 77 mmHg  Mean aortic valve gradient is 30 mmHg. Aortic valve area is 1 cm square with a valve index of 0.43 There is no significant mitral valve gradient  Oxygen saturations: PA 61% AO 97%  Cardiac Output (Fick) 4.9 L  per minute  Cardiac Index (Fick) 2 L per minute per meter square            Coronary angiography: Coronary dominance: right  Left mainstem: There is 40% stenosis in the distal left main.  Left anterior descending (LAD): The LAD is a large vessel which extends to the apex. It gives rise to a moderately large first diagonal branch. There is  40-50% narrowing of the ostium of the LAD.  Left circumflex (LCx): The left circumflex is a dominant vessel. It has a 90% ostial stenosis. The remainder the vessel is without significant disease.  Right coronary artery (RCA): This is a small nondominant vessel and is normal.  Left ventriculography: Not performed due to to renal insufficiency  Final Conclusions:   1. 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 2. Severe aortic stenosis 3. Normal right heart pressures.   Impression/Plan:  [name] has severe aortic stenosis and high-grade single-vessel coronary disease with progressive symptoms of exertional chest tightness, dyspnea, and dizziness. I agree that aortic valve replacement and coronary bypass graft surgery to the left circumflex coronary system is indicated to prevent further ischemia, infarction, and sudden death, as well as to improve his quality of life. I don't think the ostial LAD lesion is significant at this time. It hasn't really changed since cath in 2004. I think a LIMA graft to the LAD would fail.  I discussed the pros and cons of mechanical and tissue valves with the patient and his wife. I think that at his age of 59 with coronary disease, morbid obesity, and significant COPD that a tissue valve would likely last him the rest of his life. I would be concerned about anticoagulation for this patient due to his history of bleeding hemorrhoids which he said was a real problem when he was on Coumadin previously after knee replacement surgery. He and his wife are in agreement  that a tissue valve would be best for him. I discussed the operative procedure with the patient and family including alternatives, benefits and risks; including but not limited to bleeding, blood transfusion, infection, stroke, myocardial infarction, graft failure, heart block requiring a permanent pacemaker, organ dysfunction, and death.  [name] understands and agrees to proceed.  We will schedule surgery for Monday July 11, 2012.

## 2012-06-28 ENCOUNTER — Encounter: Payer: Managed Care, Other (non HMO) | Admitting: Surgery

## 2012-06-28 ENCOUNTER — Encounter (HOSPITAL_COMMUNITY): Payer: Self-pay | Admitting: Pharmacy Technician

## 2012-06-30 ENCOUNTER — Telehealth: Payer: Self-pay

## 2012-06-30 DIAGNOSIS — F419 Anxiety disorder, unspecified: Secondary | ICD-10-CM

## 2012-06-30 MED ORDER — ALPRAZOLAM 0.25 MG PO TABS: 0.2500 mg | ORAL_TABLET | Freq: Four times a day (QID) | ORAL | Status: DC | PRN

## 2012-06-30 NOTE — Telephone Encounter (Signed)
RX for xanax 0.25 mg po every 6 hours prn #30/ no refills called to [facility] in [place]. Pt aware

## 2012-07-07 ENCOUNTER — Encounter (HOSPITAL_COMMUNITY): Payer: Self-pay

## 2012-07-07 ENCOUNTER — Encounter (HOSPITAL_COMMUNITY)
Admission: RE | Admit: 2012-07-07 | Discharge: 2012-07-07 | Disposition: A | Discharge status: Home / Self Care | Payer: Managed Care, Other (non HMO) | Source: Ambulatory Visit | Attending: Surgery | Admitting: Surgery

## 2012-07-07 ENCOUNTER — Ambulatory Visit (HOSPITAL_COMMUNITY)
Admission: RE | Admit: 2012-07-07 | Discharge: 2012-07-07 | Disposition: A | Discharge status: Home / Self Care | Payer: Managed Care, Other (non HMO) | Source: Ambulatory Visit | Attending: Surgery | Admitting: Surgery

## 2012-07-07 VITALS — BP 138/58 | HR 82 | Temp 97.9°F | Resp 20 | Ht 70.0 in | Wt 298.0 lb

## 2012-07-07 DIAGNOSIS — Z01810 Encounter for preprocedural cardiovascular examination: Secondary | ICD-10-CM

## 2012-07-07 DIAGNOSIS — I350 Nonrheumatic aortic (valve) stenosis: Secondary | ICD-10-CM

## 2012-07-07 DIAGNOSIS — R55 Syncope and collapse: Secondary | ICD-10-CM | POA: Insufficient documentation

## 2012-07-07 DIAGNOSIS — I2510 Atherosclerotic heart disease of native coronary artery without angina pectoris: Secondary | ICD-10-CM

## 2012-07-07 DIAGNOSIS — I10 Essential (primary) hypertension: Secondary | ICD-10-CM | POA: Insufficient documentation

## 2012-07-07 DIAGNOSIS — F17200 Nicotine dependence, unspecified, uncomplicated: Secondary | ICD-10-CM | POA: Insufficient documentation

## 2012-07-07 DIAGNOSIS — I359 Nonrheumatic aortic valve disorder, unspecified: Secondary | ICD-10-CM | POA: Insufficient documentation

## 2012-07-07 HISTORY — DX: Unspecified hemorrhoids: K64.9

## 2012-07-07 HISTORY — DX: Hypothyroidism, unspecified: E03.9

## 2012-07-07 LAB — SURGICAL PCR SCREEN: Staphylococcus aureus: NEGATIVE

## 2012-07-07 LAB — COMPREHENSIVE METABOLIC PANEL
ALT: 31 U/L (ref 0–53)
AST: 26 U/L (ref 0–37)
Alkaline Phosphatase: 60 U/L (ref 39–117)
CO2: 22 mEq/L (ref 19–32)
Chloride: 99 mEq/L (ref 96–112)
GFR calc non Af Amer: 36 mL/min — ABNORMAL LOW (ref >90)
Sodium: 135 mEq/L (ref 135–145)
Total Bilirubin: 0.2 mg/dL — ABNORMAL LOW (ref 0.3–1.2)

## 2012-07-07 LAB — CBC
MCV: 91.1 fL (ref 78.0–100.0)
Platelets: 244 10*3/uL (ref 150–400)
RBC: 4.15 MIL/uL — ABNORMAL LOW (ref 4.22–5.81)
WBC: 14.4 10*3/uL — ABNORMAL HIGH (ref 4.0–10.5)

## 2012-07-07 LAB — BLOOD GAS, ARTERIAL
Acid-base deficit: 3.7 mmol/L — ABNORMAL HIGH (ref 0.0–2.0)
Drawn by: 344381
pCO2 arterial: 34.7 mmHg — ABNORMAL LOW (ref 35.0–45.0)
pO2, Arterial: 77.9 mmHg — ABNORMAL LOW (ref 80.0–100.0)

## 2012-07-07 LAB — TYPE AND SCREEN: Antibody Screen: NEGATIVE

## 2012-07-07 LAB — PULMONARY FUNCTION TEST

## 2012-07-07 LAB — APTT: aPTT: 38 seconds — ABNORMAL HIGH (ref 24–37)

## 2012-07-07 MED ORDER — ALBUTEROL SULFATE (5 MG/ML) 0.5% IN NEBU
2.5000 mg | INHALATION_SOLUTION | Freq: Once | RESPIRATORY_TRACT | Status: AC
  Administered 2012-07-07: 2.5 mg via RESPIRATORY_TRACT

## 2012-07-07 NOTE — Progress Notes (Signed)
VASCULAR LAB PRELIMINARY  PRELIMINARY  PRELIMINARY  PRELIMINARY  Pre-op Cardiac Surgery  Carotid Findings:  Bilateral:  No evidence of hemodynamically significant internal carotid artery stenosis.   Vertebral artery flow is antegrade.     Upper Extremity Right Left  Brachial Pressures 127 Triphasic 145 Triphasic  Radial Waveforms Triphasic Triphasic  Ulnar Waveforms Triphasic Triphasic  Palmar Arch (Allen's Test) Normal Normal   Findings:   Doppler waveforms remained normal bilaterally with both radial and ulnar copressions   Lower  Extremity Right Left  Dorsalis Pedis 147 Biphasic 152 Triphasic      Posterior Tibial 143 Biphasic 146 Biphasic  Ankle/Brachial Indices 1.01 1.05    Findings:  ABIs are within normal limits bilaterally at rest   [name], RVS 07/07/2012, 1:32 PM

## 2012-07-07 NOTE — Pre-Procedure Instructions (Signed)
[name]  07/07/2012   Your procedure is scheduled on:  07/11/12  Report to [facility] at 530 AM.  Call this number if you have problems the morning of surgery: [phone]   Remember:   Do not eat food or drink liquids after midnight.   Take these medicines the morning of surgery with A SIP OF WATER: **all inhalers,xanax,zoloft,synthroid   Do not wear jewelry, make-up or nail polish.  Do not wear lotions, powders, or perfumes. You may wear deodorant.  Do not shave 48 hours prior to surgery. Men may shave face and neck.  Do not bring valuables to the hospital.  Contacts, dentures or bridgework may not be worn into surgery.  Leave suitcase in the car. After surgery it may be brought to your room.  For patients admitted to the hospital, checkout time is 11:00 AM the day of  discharge.   Patients discharged the day of surgery will not be allowed to drive  home.  Name and phone number of your driver: family  Special Instructions: Shower using CHG 2 nights before surgery and the night before surgery.  If you shower the day of surgery use CHG.  Use special wash - you have one bottle of CHG for all showers.  You should use approximately 1/3 of the bottle for each shower.   Please read over the following fact sheets that you were given: Pain Booklet, Coughing and Deep Breathing, Blood Transfusion Information, Open Heart Packet, MRSA Information and Surgical Site Infection Prevention

## 2012-07-07 NOTE — Progress Notes (Signed)
PFT,Dopples done with pat visit. Ryan to call pt tonight for questions.

## 2012-07-08 ENCOUNTER — Telehealth: Payer: Self-pay | Admitting: *Deleted

## 2012-07-08 NOTE — Telephone Encounter (Signed)
Completed heart surgery education and book was given.  Answered all questions and told to call if any other concerns come up.

## 2012-07-10 MED ORDER — NITROGLYCERIN IN D5W 200-5 MCG/ML-% IV SOLN
2.0000 ug/min | INTRAVENOUS | Status: AC
  Administered 2012-07-11: 16.66 ug/min via INTRAVENOUS
  Filled 2012-07-10: qty 250

## 2012-07-10 MED ORDER — DEXTROSE 5 % IV SOLN
750.0000 mg | INTRAVENOUS | Status: DC
  Filled 2012-07-10: qty 750

## 2012-07-10 MED ORDER — DOPAMINE-DEXTROSE 3.2-5 MG/ML-% IV SOLN
2.0000 ug/kg/min | INTRAVENOUS | Status: AC
  Administered 2012-07-11: 2 ug/kg/min via INTRAVENOUS
  Filled 2012-07-10: qty 250

## 2012-07-10 MED ORDER — DEXTROSE 5 % IV SOLN
0.5000 ug/min | INTRAVENOUS | Status: DC
  Filled 2012-07-10: qty 4

## 2012-07-10 MED ORDER — MAGNESIUM SULFATE 50 % IJ SOLN
40.0000 meq | INTRAMUSCULAR | Status: DC
  Filled 2012-07-10: qty 10

## 2012-07-10 MED ORDER — VERAPAMIL HCL 2.5 MG/ML IV SOLN
INTRAVENOUS | Status: AC
  Administered 2012-07-11: 10:00:00
  Filled 2012-07-10: qty 2.5

## 2012-07-10 MED ORDER — DEXMEDETOMIDINE HCL IN NACL 400 MCG/100ML IV SOLN
0.1000 ug/kg/h | INTRAVENOUS | Status: AC
  Administered 2012-07-11: 0.2 ug/kg/h via INTRAVENOUS
  Filled 2012-07-10: qty 100

## 2012-07-10 MED ORDER — SODIUM CHLORIDE 0.9 % IV SOLN
INTRAVENOUS | Status: AC
  Administered 2012-07-11: 1.6 [IU]/h via INTRAVENOUS
  Filled 2012-07-10: qty 1

## 2012-07-10 MED ORDER — SODIUM CHLORIDE 0.9 % IV SOLN
INTRAVENOUS | Status: AC
  Administered 2012-07-11: 140 mL/h via INTRAVENOUS
  Filled 2012-07-10: qty 40

## 2012-07-10 MED ORDER — DEXTROSE 5 % IV SOLN
1.5000 g | INTRAVENOUS | Status: AC
  Administered 2012-07-11: 1.5 g via INTRAVENOUS
  Administered 2012-07-11: .75 g via INTRAVENOUS
  Filled 2012-07-10: qty 1.5

## 2012-07-10 MED ORDER — PHENYLEPHRINE HCL 10 MG/ML IJ SOLN
30.0000 ug/min | INTRAVENOUS | Status: AC
  Administered 2012-07-11: 40 ug/min via INTRAVENOUS
  Administered 2012-07-11: 20 ug/min via INTRAVENOUS
  Filled 2012-07-10: qty 2

## 2012-07-10 MED ORDER — CHLORHEXIDINE GLUCONATE 4 % EX LIQD: 30.0000 mL | CUTANEOUS | Status: DC

## 2012-07-10 MED ORDER — METOPROLOL TARTRATE 12.5 MG HALF TABLET
12.5000 mg | ORAL_TABLET | Freq: Once | ORAL | Status: AC
  Administered 2012-07-11: 12.5 mg via ORAL
  Filled 2012-07-10: qty 1

## 2012-07-10 MED ORDER — VANCOMYCIN HCL 10 G IV SOLR
1500.0000 mg | INTRAVENOUS | Status: AC
  Administered 2012-07-11: 1500 mg via INTRAVENOUS
  Filled 2012-07-10: qty 1500

## 2012-07-10 MED ORDER — POTASSIUM CHLORIDE 2 MEQ/ML IV SOLN
80.0000 meq | INTRAVENOUS | Status: DC
  Filled 2012-07-10: qty 40

## 2012-07-11 ENCOUNTER — Inpatient Hospital Stay (HOSPITAL_COMMUNITY)
Admission: RE | Admit: 2012-07-11 | Discharge: 2012-07-15 | DRG: 220 | Disposition: A | Discharge status: Home / Self Care | Payer: Managed Care, Other (non HMO) | Source: Ambulatory Visit | Attending: Surgery | Admitting: Surgery

## 2012-07-11 ENCOUNTER — Encounter (HOSPITAL_COMMUNITY): Payer: Self-pay | Admitting: *Deleted

## 2012-07-11 ENCOUNTER — Inpatient Hospital Stay (HOSPITAL_COMMUNITY): Payer: Managed Care, Other (non HMO) | Admitting: Anesthesiology

## 2012-07-11 ENCOUNTER — Encounter (HOSPITAL_COMMUNITY)
Admission: RE | Disposition: A | Discharge status: Home / Self Care | Payer: Self-pay | Source: Ambulatory Visit | Attending: Surgery

## 2012-07-11 ENCOUNTER — Encounter (HOSPITAL_COMMUNITY): Payer: Self-pay | Admitting: Anesthesiology

## 2012-07-11 ENCOUNTER — Inpatient Hospital Stay (HOSPITAL_COMMUNITY): Payer: Managed Care, Other (non HMO)

## 2012-07-11 DIAGNOSIS — I2510 Atherosclerotic heart disease of native coronary artery without angina pectoris: Secondary | ICD-10-CM

## 2012-07-11 DIAGNOSIS — Z87442 Personal history of urinary calculi: Secondary | ICD-10-CM

## 2012-07-11 DIAGNOSIS — Z951 Presence of aortocoronary bypass graft: Secondary | ICD-10-CM

## 2012-07-11 DIAGNOSIS — I350 Nonrheumatic aortic (valve) stenosis: Secondary | ICD-10-CM

## 2012-07-11 DIAGNOSIS — Z79899 Other long term (current) drug therapy: Secondary | ICD-10-CM

## 2012-07-11 DIAGNOSIS — R0989 Other specified symptoms and signs involving the circulatory and respiratory systems: Secondary | ICD-10-CM | POA: Diagnosis not present

## 2012-07-11 DIAGNOSIS — I359 Nonrheumatic aortic valve disorder, unspecified: Secondary | ICD-10-CM

## 2012-07-11 DIAGNOSIS — Z7982 Long term (current) use of aspirin: Secondary | ICD-10-CM

## 2012-07-11 DIAGNOSIS — R0609 Other forms of dyspnea: Secondary | ICD-10-CM | POA: Diagnosis not present

## 2012-07-11 DIAGNOSIS — E039 Hypothyroidism, unspecified: Secondary | ICD-10-CM | POA: Diagnosis present

## 2012-07-11 DIAGNOSIS — Z6841 Body Mass Index (BMI) 40.0 and over, adult: Secondary | ICD-10-CM

## 2012-07-11 DIAGNOSIS — M129 Arthropathy, unspecified: Secondary | ICD-10-CM | POA: Diagnosis present

## 2012-07-11 DIAGNOSIS — Z952 Presence of prosthetic heart valve: Secondary | ICD-10-CM

## 2012-07-11 DIAGNOSIS — J449 Chronic obstructive pulmonary disease, unspecified: Secondary | ICD-10-CM | POA: Diagnosis present

## 2012-07-11 DIAGNOSIS — I2584 Coronary atherosclerosis due to calcified coronary lesion: Secondary | ICD-10-CM | POA: Diagnosis present

## 2012-07-11 DIAGNOSIS — F329 Major depressive disorder, single episode, unspecified: Secondary | ICD-10-CM | POA: Diagnosis present

## 2012-07-11 DIAGNOSIS — Z87891 Personal history of nicotine dependence: Secondary | ICD-10-CM

## 2012-07-11 DIAGNOSIS — Z96659 Presence of unspecified artificial knee joint: Secondary | ICD-10-CM

## 2012-07-11 DIAGNOSIS — R011 Cardiac murmur, unspecified: Secondary | ICD-10-CM | POA: Diagnosis present

## 2012-07-11 DIAGNOSIS — J4489 Other specified chronic obstructive pulmonary disease: Secondary | ICD-10-CM | POA: Diagnosis present

## 2012-07-11 DIAGNOSIS — E119 Type 2 diabetes mellitus without complications: Secondary | ICD-10-CM | POA: Diagnosis present

## 2012-07-11 DIAGNOSIS — F3289 Other specified depressive episodes: Secondary | ICD-10-CM | POA: Diagnosis present

## 2012-07-11 DIAGNOSIS — I129 Hypertensive chronic kidney disease with stage 1 through stage 4 chronic kidney disease, or unspecified chronic kidney disease: Secondary | ICD-10-CM | POA: Diagnosis present

## 2012-07-11 DIAGNOSIS — N189 Chronic kidney disease, unspecified: Secondary | ICD-10-CM | POA: Diagnosis present

## 2012-07-11 DIAGNOSIS — Z8719 Personal history of other diseases of the digestive system: Secondary | ICD-10-CM

## 2012-07-11 HISTORY — PX: AORTIC VALVE REPLACEMENT: SHX41

## 2012-07-11 HISTORY — PX: INTRAOPERATIVE TRANSESOPHAGEAL ECHOCARDIOGRAM: SHX5062

## 2012-07-11 HISTORY — PX: CORONARY ARTERY BYPASS GRAFT: SHX141

## 2012-07-11 LAB — POCT I-STAT, CHEM 8
BUN: 43 mg/dL — ABNORMAL HIGH (ref 6–23)
Creatinine, Ser: 1.9 mg/dL — ABNORMAL HIGH (ref 0.50–1.35)
Glucose, Bld: 128 mg/dL — ABNORMAL HIGH (ref 70–99)
Potassium: 4.5 mEq/L (ref 3.5–5.1)
Sodium: 135 mEq/L (ref 135–145)

## 2012-07-11 LAB — POCT I-STAT 4, (NA,K, GLUC, HGB,HCT)
Glucose, Bld: 100 mg/dL — ABNORMAL HIGH (ref 70–99)
Glucose, Bld: 114 mg/dL — ABNORMAL HIGH (ref 70–99)
Glucose, Bld: 181 mg/dL — ABNORMAL HIGH (ref 70–99)
Glucose, Bld: 199 mg/dL — ABNORMAL HIGH (ref 70–99)
Glucose, Bld: 172 mg/dL — ABNORMAL HIGH (ref 70–99)
HCT: 30 % — ABNORMAL LOW (ref 39.0–52.0)
HCT: 26 % — ABNORMAL LOW (ref 39.0–52.0)
HCT: 27 % — ABNORMAL LOW (ref 39.0–52.0)
HCT: 35 % — ABNORMAL LOW (ref 39.0–52.0)
Hemoglobin: 8.8 g/dL — ABNORMAL LOW (ref 13.0–17.0)
Hemoglobin: 8.8 g/dL — ABNORMAL LOW (ref 13.0–17.0)
Hemoglobin: 9.2 g/dL — ABNORMAL LOW (ref 13.0–17.0)
Hemoglobin: 11.9 g/dL — ABNORMAL LOW (ref 13.0–17.0)
Potassium: 5.7 mEq/L — ABNORMAL HIGH (ref 3.5–5.1)
Potassium: 5.8 mEq/L — ABNORMAL HIGH (ref 3.5–5.1)
Potassium: 4.4 mEq/L (ref 3.5–5.1)
Sodium: 138 mEq/L (ref 135–145)
Sodium: 131 mEq/L — ABNORMAL LOW (ref 135–145)
Sodium: 136 mEq/L (ref 135–145)
Sodium: 134 mEq/L — ABNORMAL LOW (ref 135–145)
Sodium: 137 mEq/L (ref 135–145)
Sodium: 140 mEq/L (ref 135–145)

## 2012-07-11 LAB — POCT I-STAT 3, ART BLOOD GAS (G3+)
Acid-base deficit: 5 mmol/L — ABNORMAL HIGH (ref 0.0–2.0)
Acid-base deficit: 1 mmol/L (ref 0.0–2.0)
Acid-base deficit: 4 mmol/L — ABNORMAL HIGH (ref 0.0–2.0)
Bicarbonate: 21.8 mEq/L (ref 20.0–24.0)
Bicarbonate: 23.4 mEq/L (ref 20.0–24.0)
Bicarbonate: 23.1 mEq/L (ref 20.0–24.0)
Bicarbonate: 24.7 mEq/L — ABNORMAL HIGH (ref 20.0–24.0)
Bicarbonate: 23.6 mEq/L (ref 20.0–24.0)
O2 Saturation: 100 %
O2 Saturation: 100 %
O2 Saturation: 97 %
O2 Saturation: 98 %
Patient temperature: 36
Patient temperature: 36.3
Patient temperature: 36.8
TCO2: 22 mmol/L (ref 0–100)
TCO2: 23 mmol/L (ref 0–100)
TCO2: 26 mmol/L (ref 0–100)
TCO2: 25 mmol/L (ref 0–100)
pCO2 arterial: 37.6 mmHg (ref 35.0–45.0)
pCO2 arterial: 38.8 mmHg (ref 35.0–45.0)
pCO2 arterial: 46.6 mmHg — ABNORMAL HIGH (ref 35.0–45.0)
pH, Arterial: 7.371 (ref 7.350–7.450)
pH, Arterial: 7.388 (ref 7.350–7.450)
pH, Arterial: 7.3 — ABNORMAL LOW (ref 7.350–7.450)
pH, Arterial: 7.29 — ABNORMAL LOW (ref 7.350–7.450)
pH, Arterial: 7.31 — ABNORMAL LOW (ref 7.350–7.450)
pO2, Arterial: 287 mmHg — ABNORMAL HIGH (ref 80.0–100.0)
pO2, Arterial: 231 mmHg — ABNORMAL HIGH (ref 80.0–100.0)

## 2012-07-11 LAB — GLUCOSE, CAPILLARY
Glucose-Capillary: 107 mg/dL — ABNORMAL HIGH (ref 70–99)
Glucose-Capillary: 99 mg/dL (ref 70–99)
Glucose-Capillary: 102 mg/dL — ABNORMAL HIGH (ref 70–99)
Glucose-Capillary: 120 mg/dL — ABNORMAL HIGH (ref 70–99)
Glucose-Capillary: 128 mg/dL — ABNORMAL HIGH (ref 70–99)

## 2012-07-11 LAB — CBC
HCT: 32.9 % — ABNORMAL LOW (ref 39.0–52.0)
HCT: 34.5 % — ABNORMAL LOW (ref 39.0–52.0)
Hemoglobin: 11.2 g/dL — ABNORMAL LOW (ref 13.0–17.0)
Hemoglobin: 11.6 g/dL — ABNORMAL LOW (ref 13.0–17.0)
MCH: 30.7 pg (ref 26.0–34.0)
MCHC: 34 g/dL (ref 30.0–36.0)
MCV: 90.1 fL (ref 78.0–100.0)
MCV: 90.3 fL (ref 78.0–100.0)
Platelets: 143 10*3/uL — ABNORMAL LOW (ref 150–400)
RBC: 3.65 MIL/uL — ABNORMAL LOW (ref 4.22–5.81)
RBC: 3.82 MIL/uL — ABNORMAL LOW (ref 4.22–5.81)
WBC: 12.4 10*3/uL — ABNORMAL HIGH (ref 4.0–10.5)

## 2012-07-11 LAB — PLATELET COUNT: Platelets: 179 10*3/uL (ref 150–400)

## 2012-07-11 LAB — URINE MICROSCOPIC-ADD ON

## 2012-07-11 LAB — URINALYSIS, ROUTINE W REFLEX MICROSCOPIC
Glucose, UA: NEGATIVE mg/dL
Hgb urine dipstick: NEGATIVE
Protein, ur: NEGATIVE mg/dL

## 2012-07-11 LAB — HEMOGLOBIN AND HEMATOCRIT, BLOOD: HCT: 25.1 % — ABNORMAL LOW (ref 39.0–52.0)

## 2012-07-11 LAB — CREATININE, SERUM
GFR calc Af Amer: 37 mL/min — ABNORMAL LOW (ref >90)
GFR calc non Af Amer: 32 mL/min — ABNORMAL LOW (ref >90)

## 2012-07-11 SURGERY — REPLACEMENT, AORTIC VALVE, OPEN
Anesthesia: General | Site: Chest | Wound class: Clean

## 2012-07-11 MED ORDER — VECURONIUM BROMIDE 10 MG IV SOLR
INTRAVENOUS | Status: DC | PRN
  Administered 2012-07-11: 2.5 mg via INTRAVENOUS
  Administered 2012-07-11: 5 mg via INTRAVENOUS
  Administered 2012-07-11: 2.5 mg via INTRAVENOUS

## 2012-07-11 MED ORDER — ALBUTEROL SULFATE (5 MG/ML) 0.5% IN NEBU
2.5000 mg | INHALATION_SOLUTION | RESPIRATORY_TRACT | Status: DC | PRN
  Administered 2012-07-11: 2.5 mg via RESPIRATORY_TRACT

## 2012-07-11 MED ORDER — MORPHINE SULFATE 2 MG/ML IJ SOLN: 1.0000 mg | INTRAMUSCULAR | Status: AC | PRN

## 2012-07-11 MED ORDER — NITROGLYCERIN IN D5W 200-5 MCG/ML-% IV SOLN: 0.0000 ug/min | INTRAVENOUS | Status: DC

## 2012-07-11 MED ORDER — LACTATED RINGERS IV SOLN
INTRAVENOUS | Status: DC | PRN
  Administered 2012-07-11: 07:00:00 via INTRAVENOUS

## 2012-07-11 MED ORDER — POTASSIUM CHLORIDE 10 MEQ/50ML IV SOLN: 10.0000 meq | INTRAVENOUS | Status: AC

## 2012-07-11 MED ORDER — FUROSEMIDE 10 MG/ML IJ SOLN
INTRAMUSCULAR | Status: DC | PRN
  Administered 2012-07-11: 20 mg via INTRAMUSCULAR

## 2012-07-11 MED ORDER — ASPIRIN 325 MG PO TABS
325.0000 mg | ORAL_TABLET | Freq: Every day | ORAL | Status: DC
  Administered 2012-07-12 – 2012-07-13 (×2): 325 mg via ORAL
  Filled 2012-07-11 (×3): qty 1

## 2012-07-11 MED ORDER — FENTANYL CITRATE 0.05 MG/ML IJ SOLN
INTRAMUSCULAR | Status: DC | PRN
  Administered 2012-07-11: 100 ug via INTRAVENOUS
  Administered 2012-07-11: 250 ug via INTRAVENOUS
  Administered 2012-07-11: 150 ug via INTRAVENOUS
  Administered 2012-07-11: 100 ug via INTRAVENOUS
  Administered 2012-07-11: 150 ug via INTRAVENOUS
  Administered 2012-07-11: 250 ug via INTRAVENOUS
  Administered 2012-07-11: 150 ug via INTRAVENOUS
  Administered 2012-07-11 (×2): 100 ug via INTRAVENOUS
  Administered 2012-07-11: 150 ug via INTRAVENOUS

## 2012-07-11 MED ORDER — ONDANSETRON HCL 4 MG/2ML IJ SOLN: 4.0000 mg | Freq: Four times a day (QID) | INTRAMUSCULAR | Status: DC | PRN

## 2012-07-11 MED ORDER — ALBUMIN HUMAN 5 % IV SOLN
INTRAVENOUS | Status: DC | PRN
  Administered 2012-07-11: 13:00:00 via INTRAVENOUS

## 2012-07-11 MED ORDER — THROMBIN 20000 UNITS EX SOLR
CUTANEOUS | Status: AC
  Filled 2012-07-11: qty 20000

## 2012-07-11 MED ORDER — MIDAZOLAM HCL 2 MG/2ML IJ SOLN: 2.0000 mg | INTRAMUSCULAR | Status: DC | PRN

## 2012-07-11 MED ORDER — ROCURONIUM BROMIDE 100 MG/10ML IV SOLN
INTRAVENOUS | Status: DC | PRN
  Administered 2012-07-11 (×2): 50 mg via INTRAVENOUS

## 2012-07-11 MED ORDER — VANCOMYCIN HCL IN DEXTROSE 1-5 GM/200ML-% IV SOLN
1000.0000 mg | Freq: Once | INTRAVENOUS | Status: AC
  Administered 2012-07-11: 1000 mg via INTRAVENOUS
  Filled 2012-07-11: qty 200

## 2012-07-11 MED ORDER — OXYCODONE HCL 5 MG PO TABS
5.0000 mg | ORAL_TABLET | ORAL | Status: DC | PRN
  Administered 2012-07-12 – 2012-07-13 (×4): 10 mg via ORAL
  Filled 2012-07-11 (×4): qty 2

## 2012-07-11 MED ORDER — SODIUM CHLORIDE 0.9 % IV SOLN
INTRAVENOUS | Status: DC | PRN
  Administered 2012-07-11: 10:00:00 via INTRAVENOUS

## 2012-07-11 MED ORDER — FUROSEMIDE 10 MG/ML IJ SOLN: 20.0000 mg | INTRAMUSCULAR | Status: DC

## 2012-07-11 MED ORDER — ARTIFICIAL TEARS OP OINT
TOPICAL_OINTMENT | OPHTHALMIC | Status: DC | PRN
  Administered 2012-07-11: 1 via OPHTHALMIC

## 2012-07-11 MED ORDER — HEPARIN SODIUM (PORCINE) 1000 UNIT/ML IJ SOLN
INTRAMUSCULAR | Status: DC | PRN
  Administered 2012-07-11: 50000 [IU] via INTRAVENOUS

## 2012-07-11 MED ORDER — DEXTROSE 5 % IV SOLN
INTRAVENOUS | Status: DC | PRN
  Administered 2012-07-11: 08:00:00 via INTRAVENOUS

## 2012-07-11 MED ORDER — SODIUM CHLORIDE 0.9 % IV SOLN
INTRAVENOUS | Status: DC
  Administered 2012-07-12: 1.6 [IU]/h via INTRAVENOUS
  Filled 2012-07-11 (×2): qty 1

## 2012-07-11 MED ORDER — METOPROLOL TARTRATE 1 MG/ML IV SOLN: 2.5000 mg | INTRAVENOUS | Status: DC | PRN

## 2012-07-11 MED ORDER — ACETAMINOPHEN 10 MG/ML IV SOLN
1000.0000 mg | Freq: Once | INTRAVENOUS | Status: AC
  Administered 2012-07-11: 1000 mg via INTRAVENOUS
  Filled 2012-07-11: qty 100

## 2012-07-11 MED ORDER — SODIUM CHLORIDE 0.9 % IJ SOLN
3.0000 mL | Freq: Two times a day (BID) | INTRAMUSCULAR | Status: DC
  Administered 2012-07-12 – 2012-07-13 (×3): 3 mL via INTRAVENOUS

## 2012-07-11 MED ORDER — LACTATED RINGERS IV SOLN: INTRAVENOUS | Status: DC

## 2012-07-11 MED ORDER — ALBUTEROL SULFATE (5 MG/ML) 0.5% IN NEBU
2.5000 mg | INHALATION_SOLUTION | Freq: Four times a day (QID) | RESPIRATORY_TRACT | Status: DC
  Administered 2012-07-12 – 2012-07-13 (×6): 2.5 mg via RESPIRATORY_TRACT
  Filled 2012-07-11 (×8): qty 0.5

## 2012-07-11 MED ORDER — SERTRALINE HCL 100 MG PO TABS
100.0000 mg | ORAL_TABLET | Freq: Every day | ORAL | Status: DC
  Administered 2012-07-12 – 2012-07-15 (×4): 100 mg via ORAL
  Filled 2012-07-11 (×4): qty 1

## 2012-07-11 MED ORDER — SODIUM CHLORIDE 0.9 % IV SOLN
INTRAVENOUS | Status: DC
  Administered 2012-07-11: 20 mL/h via INTRAVENOUS

## 2012-07-11 MED ORDER — MIDAZOLAM HCL 5 MG/5ML IJ SOLN
INTRAMUSCULAR | Status: DC | PRN
  Administered 2012-07-11: 2 mg via INTRAVENOUS
  Administered 2012-07-11: 5 mg via INTRAVENOUS
  Administered 2012-07-11: 2 mg via INTRAVENOUS

## 2012-07-11 MED ORDER — BISACODYL 10 MG RE SUPP: 10.0000 mg | Freq: Every day | RECTAL | Status: DC

## 2012-07-11 MED ORDER — SODIUM CHLORIDE 0.45 % IV SOLN
INTRAVENOUS | Status: DC
  Administered 2012-07-11: 20 mL/h via INTRAVENOUS

## 2012-07-11 MED ORDER — THROMBIN 20000 UNITS EX SOLR
OROMUCOSAL | Status: DC | PRN
  Administered 2012-07-11: 10:00:00 via TOPICAL

## 2012-07-11 MED ORDER — DEXTROSE 5 % IV SOLN
INTRAVENOUS | Status: DC | PRN
  Administered 2012-07-11: 13:00:00 via INTRAVENOUS

## 2012-07-11 MED ORDER — PANTOPRAZOLE SODIUM 40 MG PO TBEC
40.0000 mg | DELAYED_RELEASE_TABLET | Freq: Every day | ORAL | Status: DC
  Administered 2012-07-13: 40 mg via ORAL
  Filled 2012-07-11: qty 1

## 2012-07-11 MED ORDER — ACETAMINOPHEN 160 MG/5ML PO SOLN
975.0000 mg | Freq: Four times a day (QID) | ORAL | Status: DC
  Administered 2012-07-12 (×2): 975 mg
  Filled 2012-07-11 (×2): qty 40.6

## 2012-07-11 MED ORDER — ALBUMIN HUMAN 5 % IV SOLN
250.0000 mL | INTRAVENOUS | Status: AC | PRN
Start: 2012-07-11 — End: 2012-07-12

## 2012-07-11 MED ORDER — PHENYLEPHRINE HCL 10 MG/ML IJ SOLN
0.0000 ug/min | INTRAVENOUS | Status: DC
  Administered 2012-07-12: 20 ug/min via INTRAVENOUS
  Filled 2012-07-11 (×2): qty 2

## 2012-07-11 MED ORDER — METOPROLOL TARTRATE 25 MG/10 ML ORAL SUSPENSION
12.5000 mg | Freq: Two times a day (BID) | ORAL | Status: DC
Start: 2012-07-11 — End: 2012-07-13
  Filled 2012-07-11 (×5): qty 5

## 2012-07-11 MED ORDER — LACTATED RINGERS IV SOLN: 500.0000 mL | Freq: Once | INTRAVENOUS | Status: AC | PRN

## 2012-07-11 MED ORDER — SUCCINYLCHOLINE CHLORIDE 20 MG/ML IJ SOLN
INTRAMUSCULAR | Status: DC | PRN
  Administered 2012-07-11: 140 mg via INTRAVENOUS

## 2012-07-11 MED ORDER — ACETAMINOPHEN 500 MG PO TABS
1000.0000 mg | ORAL_TABLET | Freq: Four times a day (QID) | ORAL | Status: DC
  Administered 2012-07-12 – 2012-07-13 (×4): 1000 mg via ORAL
  Filled 2012-07-11 (×8): qty 2

## 2012-07-11 MED ORDER — THROMBIN 20000 UNITS EX KIT
PACK | CUTANEOUS | Status: DC | PRN
  Administered 2012-07-11: 20000 [IU] via TOPICAL

## 2012-07-11 MED ORDER — SODIUM CHLORIDE 0.9 % IJ SOLN: 3.0000 mL | INTRAMUSCULAR | Status: DC | PRN

## 2012-07-11 MED ORDER — SIMVASTATIN 40 MG PO TABS
40.0000 mg | ORAL_TABLET | Freq: Every evening | ORAL | Status: DC
  Administered 2012-07-12 – 2012-07-14 (×3): 40 mg via ORAL
  Filled 2012-07-11 (×5): qty 1

## 2012-07-11 MED ORDER — SODIUM CHLORIDE 0.9 % IV SOLN: 250.0000 mL | INTRAVENOUS | Status: DC | PRN

## 2012-07-11 MED ORDER — LEVOTHYROXINE SODIUM 125 MCG PO TABS
125.0000 ug | ORAL_TABLET | Freq: Every day | ORAL | Status: DC
  Administered 2012-07-12 – 2012-07-15 (×4): 125 ug via ORAL
  Filled 2012-07-11 (×4): qty 1

## 2012-07-11 MED ORDER — DEXTROSE 5 % IV SOLN
1.5000 g | Freq: Two times a day (BID) | INTRAVENOUS | Status: AC
  Administered 2012-07-11 – 2012-07-13 (×4): 1.5 g via INTRAVENOUS
  Filled 2012-07-11 (×4): qty 1.5

## 2012-07-11 MED ORDER — DOCUSATE SODIUM 100 MG PO CAPS
200.0000 mg | ORAL_CAPSULE | Freq: Every day | ORAL | Status: DC
  Administered 2012-07-12 – 2012-07-13 (×2): 200 mg via ORAL
  Filled 2012-07-11 (×2): qty 2

## 2012-07-11 MED ORDER — SODIUM CHLORIDE 0.9 % IV SOLN
INTRAVENOUS | Status: DC
  Filled 2012-07-11: qty 40

## 2012-07-11 MED ORDER — DOPAMINE-DEXTROSE 3.2-5 MG/ML-% IV SOLN
2.0000 ug/kg/min | INTRAVENOUS | Status: DC
  Administered 2012-07-13: 2.5 ug/kg/min via INTRAVENOUS
  Filled 2012-07-11: qty 250

## 2012-07-11 MED ORDER — FAMOTIDINE IN NACL 20-0.9 MG/50ML-% IV SOLN
20.0000 mg | Freq: Two times a day (BID) | INTRAVENOUS | Status: AC
  Administered 2012-07-11 – 2012-07-12 (×2): 20 mg via INTRAVENOUS
  Filled 2012-07-11 (×2): qty 50

## 2012-07-11 MED ORDER — TIOTROPIUM BROMIDE MONOHYDRATE 18 MCG IN CAPS
18.0000 ug | ORAL_CAPSULE | Freq: Every day | RESPIRATORY_TRACT | Status: DC
  Administered 2012-07-12 – 2012-07-15 (×4): 18 ug via RESPIRATORY_TRACT
  Filled 2012-07-11: qty 5

## 2012-07-11 MED ORDER — BISACODYL 5 MG PO TBEC
10.0000 mg | DELAYED_RELEASE_TABLET | Freq: Every day | ORAL | Status: DC
  Administered 2012-07-12 – 2012-07-13 (×2): 10 mg via ORAL
  Filled 2012-07-11 (×2): qty 2

## 2012-07-11 MED ORDER — PROTAMINE SULFATE 10 MG/ML IV SOLN
INTRAVENOUS | Status: DC | PRN
  Administered 2012-07-11: 190 mg via INTRAVENOUS
  Administered 2012-07-11: 180 mg via INTRAVENOUS
  Administered 2012-07-11: 10 mg via INTRAVENOUS

## 2012-07-11 MED ORDER — ASPIRIN 81 MG PO CHEW
324.0000 mg | CHEWABLE_TABLET | Freq: Every day | ORAL | Status: DC
  Administered 2012-07-12 – 2012-07-13 (×2): 324 mg

## 2012-07-11 MED ORDER — PROPOFOL 10 MG/ML IV BOLUS
INTRAVENOUS | Status: DC | PRN
  Administered 2012-07-11: 60 mg via INTRAVENOUS
  Administered 2012-07-11 (×2): 30 mg via INTRAVENOUS
  Administered 2012-07-11: 40 mg via INTRAVENOUS

## 2012-07-11 MED ORDER — LIDOCAINE HCL (CARDIAC) 20 MG/ML IV SOLN
INTRAVENOUS | Status: DC | PRN
  Administered 2012-07-11: 80 mg via INTRAVENOUS

## 2012-07-11 MED ORDER — MAGNESIUM SULFATE 40 MG/ML IJ SOLN
4.0000 g | Freq: Once | INTRAMUSCULAR | Status: DC
  Filled 2012-07-11: qty 100

## 2012-07-11 MED ORDER — HEMOSTATIC AGENTS (NO CHARGE) OPTIME
TOPICAL | Status: DC | PRN
  Administered 2012-07-11: 1 via TOPICAL

## 2012-07-11 MED ORDER — SODIUM CHLORIDE 0.9 % IV SOLN
INTRAVENOUS | Status: DC | PRN
  Administered 2012-07-11: 09:00:00 via INTRAVENOUS

## 2012-07-11 MED ORDER — METOPROLOL TARTRATE 12.5 MG HALF TABLET
12.5000 mg | ORAL_TABLET | Freq: Two times a day (BID) | ORAL | Status: DC
  Administered 2012-07-12 – 2012-07-13 (×2): 12.5 mg via ORAL
  Filled 2012-07-11 (×5): qty 1

## 2012-07-11 MED ORDER — MORPHINE SULFATE 2 MG/ML IJ SOLN
2.0000 mg | INTRAMUSCULAR | Status: DC | PRN
  Administered 2012-07-12: 4 mg via INTRAVENOUS
  Administered 2012-07-12 (×3): 2 mg via INTRAVENOUS
  Filled 2012-07-11 (×2): qty 1
  Filled 2012-07-11: qty 2
  Filled 2012-07-11: qty 1

## 2012-07-11 MED ORDER — INSULIN REGULAR BOLUS VIA INFUSION
0.0000 [IU] | Freq: Three times a day (TID) | INTRAVENOUS | Status: DC
  Administered 2012-07-12: 4 [IU] via INTRAVENOUS
  Filled 2012-07-11: qty 10

## 2012-07-11 MED ORDER — DEXMEDETOMIDINE HCL IN NACL 200 MCG/50ML IV SOLN
0.1000 ug/kg/h | INTRAVENOUS | Status: DC
  Filled 2012-07-11: qty 50

## 2012-07-11 SURGICAL SUPPLY — 123 items
ADAPTER CARDIO PERF ANTE/RETRO (ADAPTER) ×2 IMPLANT
ADPR PRFSN 84XANTGRD RTRGD (ADAPTER) ×1
APL SKNCLS STERI-STRIP NONHPOA (GAUZE/BANDAGES/DRESSINGS) ×1
ATTRACTOMAT 16X20 MAGNETIC DRP (DRAPES) ×2 IMPLANT
BAG DECANTER FOR FLEXI CONT (MISCELLANEOUS) ×2 IMPLANT
BANDAGE ELASTIC 4 VELCRO ST LF (GAUZE/BANDAGES/DRESSINGS) ×2 IMPLANT
BANDAGE ELASTIC 6 VELCRO ST LF (GAUZE/BANDAGES/DRESSINGS) ×2 IMPLANT
BANDAGE GAUZE ELAST BULKY 4 IN (GAUZE/BANDAGES/DRESSINGS) ×2 IMPLANT
BASKET HEART (ORDER IN 25'S) (MISCELLANEOUS) ×1
BASKET HEART (ORDER IN 25S) (MISCELLANEOUS) ×1 IMPLANT
BENZOIN TINCTURE PRP APPL 2/3 (GAUZE/BANDAGES/DRESSINGS) ×1 IMPLANT
BLADE STERNUM SYSTEM 6 (BLADE) ×2 IMPLANT
BLADE SURG 11 STRL SS (BLADE) ×1 IMPLANT
BLADE SURG 15 STRL LF DISP TIS (BLADE) ×1 IMPLANT
BLADE SURG 15 STRL SS (BLADE) ×2
BLADE SURG ROTATE 9660 (MISCELLANEOUS) ×1 IMPLANT
BLOOD HAEMOCONCENTR 700 MIDI (MISCELLANEOUS) ×1 IMPLANT
CANISTER SUCTION 2500CC (MISCELLANEOUS) ×2 IMPLANT
CANN PRFSN .5XCNCT 15X34-48 (MISCELLANEOUS) ×1
CANNULA GUNDRY RCSP 15FR (MISCELLANEOUS) ×3 IMPLANT
CANNULA PRFSN .5XCNCT 15X34-48 (MISCELLANEOUS) IMPLANT
CANNULA VEN 2 STAGE (MISCELLANEOUS) ×2
CATH ROBINSON RED A/P 18FR (CATHETERS) ×6 IMPLANT
CATH THORACIC 28FR (CATHETERS) ×2 IMPLANT
CATH THORACIC 28FR RT ANG (CATHETERS) IMPLANT
CATH THORACIC 36FR (CATHETERS) ×2 IMPLANT
CATH THORACIC 36FR RT ANG (CATHETERS) ×2 IMPLANT
CLIP TI MEDIUM 24 (CLIP) IMPLANT
CLIP TI WIDE RED SMALL 24 (CLIP) IMPLANT
CLOTH BEACON ORANGE TIMEOUT ST (SAFETY) ×2 IMPLANT
CONT SPEC 4OZ CLIKSEAL STRL BL (MISCELLANEOUS) ×1 IMPLANT
CONT SPEC STER OR (MISCELLANEOUS) ×2 IMPLANT
COVER MAYO STAND STRL (DRAPES) ×1 IMPLANT
COVER SURGICAL LIGHT HANDLE (MISCELLANEOUS) ×4 IMPLANT
CRADLE DONUT ADULT HEAD (MISCELLANEOUS) ×2 IMPLANT
DRAPE CARDIOVASCULAR INCISE (DRAPES) ×2
DRAPE SLUSH MACHINE 52X66 (DRAPES) IMPLANT
DRAPE SLUSH/WARMER DISC (DRAPES) IMPLANT
DRAPE SRG 135X102X78XABS (DRAPES) ×1 IMPLANT
DRSG COVADERM 4X14 (GAUZE/BANDAGES/DRESSINGS) ×2 IMPLANT
ELECT CAUTERY BLADE 6.4 (BLADE) ×2 IMPLANT
ELECT REM PT RETURN 9FT ADLT (ELECTROSURGICAL) ×4
ELECTRODE REM PT RTRN 9FT ADLT (ELECTROSURGICAL) ×2 IMPLANT
GLOVE BIO SURGEON STRL SZ 6 (GLOVE) ×6 IMPLANT
GLOVE BIO SURGEON STRL SZ 6.5 (GLOVE) ×4 IMPLANT
GLOVE BIO SURGEON STRL SZ7 (GLOVE) IMPLANT
GLOVE BIO SURGEON STRL SZ7.5 (GLOVE) ×1 IMPLANT
GLOVE BIOGEL PI IND STRL 6 (GLOVE) IMPLANT
GLOVE BIOGEL PI IND STRL 6.5 (GLOVE) IMPLANT
GLOVE BIOGEL PI IND STRL 7.0 (GLOVE) IMPLANT
GLOVE BIOGEL PI INDICATOR 6 (GLOVE) ×2
GLOVE BIOGEL PI INDICATOR 6.5 (GLOVE)
GLOVE BIOGEL PI INDICATOR 7.0 (GLOVE)
GLOVE EUDERMIC 7 POWDERFREE (GLOVE) ×4 IMPLANT
GLOVE ORTHO TXT STRL SZ7.5 (GLOVE) IMPLANT
GLOVE SURG SS PI 6.5 STRL IVOR (GLOVE) ×4 IMPLANT
GOWN PREVENTION PLUS XLARGE (GOWN DISPOSABLE) ×3 IMPLANT
GOWN STRL NON-REIN LRG LVL3 (GOWN DISPOSABLE) ×12 IMPLANT
HEART VENT LT CURVED (MISCELLANEOUS) ×2 IMPLANT
HEMOSTAT POWDER SURGIFOAM 1G (HEMOSTASIS) ×6 IMPLANT
HEMOSTAT SURGICEL 2X14 (HEMOSTASIS) ×2 IMPLANT
INSERT FOGARTY 61MM (MISCELLANEOUS) IMPLANT
INSERT FOGARTY XLG (MISCELLANEOUS) IMPLANT
KIT BASIN OR (CUSTOM PROCEDURE TRAY) ×2 IMPLANT
KIT CATH CPB BARTLE (MISCELLANEOUS) ×2 IMPLANT
KIT ROOM TURNOVER OR (KITS) ×2 IMPLANT
KIT SUCTION CATH 14FR (SUCTIONS) ×2 IMPLANT
KIT VASOVIEW W/TROCAR VH 2000 (KITS) ×2 IMPLANT
LINE VENT (MISCELLANEOUS) ×1 IMPLANT
MEDTRONIC ×1 IMPLANT
NS IRRIG 1000ML POUR BTL (IV SOLUTION) ×13 IMPLANT
PACK OPEN HEART (CUSTOM PROCEDURE TRAY) ×2 IMPLANT
PAD ARMBOARD 7.5X6 YLW CONV (MISCELLANEOUS) ×4 IMPLANT
PENCIL BUTTON HOLSTER BLD 10FT (ELECTRODE) ×2 IMPLANT
PUNCH AORTIC ROTATE 4.0MM (MISCELLANEOUS) IMPLANT
PUNCH AORTIC ROTATE 4.5MM 8IN (MISCELLANEOUS) ×2 IMPLANT
PUNCH AORTIC ROTATE 5MM 8IN (MISCELLANEOUS) IMPLANT
SET CARDIOPLEGIA MPS 5001102 (MISCELLANEOUS) ×1 IMPLANT
SPONGE GAUZE 4X4 12PLY (GAUZE/BANDAGES/DRESSINGS) ×4 IMPLANT
SPONGE INTESTINAL PEANUT (DISPOSABLE) IMPLANT
SPONGE LAP 18X18 X RAY DECT (DISPOSABLE) IMPLANT
SPONGE LAP 4X18 X RAY DECT (DISPOSABLE) ×2 IMPLANT
STRIP CLOSURE SKIN 1/2X4 (GAUZE/BANDAGES/DRESSINGS) ×1 IMPLANT
SUT BONE WAX W31G (SUTURE) ×2 IMPLANT
SUT ETHIBON 2 0 V 52N 30 (SUTURE) ×6 IMPLANT
SUT MNCRL AB 4-0 PS2 18 (SUTURE) ×1 IMPLANT
SUT PROLENE 3 0 SH 1 (SUTURE) ×2 IMPLANT
SUT PROLENE 3 0 SH DA (SUTURE) IMPLANT
SUT PROLENE 3 0 SH1 36 (SUTURE) ×2 IMPLANT
SUT PROLENE 4 0 RB 1 (SUTURE) ×8
SUT PROLENE 4 0 SH DA (SUTURE) IMPLANT
SUT PROLENE 4-0 RB1 .5 CRCL 36 (SUTURE) ×3 IMPLANT
SUT PROLENE 5 0 C 1 36 (SUTURE) IMPLANT
SUT PROLENE 6 0 C 1 30 (SUTURE) IMPLANT
SUT PROLENE 7 0 BV 1 (SUTURE) IMPLANT
SUT PROLENE 7 0 BV1 MDA (SUTURE) ×2 IMPLANT
SUT PROLENE 8 0 BV175 6 (SUTURE) IMPLANT
SUT SILK  1 MH (SUTURE)
SUT SILK 1 MH (SUTURE) IMPLANT
SUT STEEL 6MS V (SUTURE) IMPLANT
SUT STEEL STERNAL CCS#1 18IN (SUTURE) IMPLANT
SUT STEEL SZ 6 DBL 3X14 BALL (SUTURE) ×3 IMPLANT
SUT VIC AB 1 CTX 36 (SUTURE) ×4
SUT VIC AB 1 CTX36XBRD ANBCTR (SUTURE) ×2 IMPLANT
SUT VIC AB 2-0 CT1 27 (SUTURE) ×2
SUT VIC AB 2-0 CT1 TAPERPNT 27 (SUTURE) IMPLANT
SUT VIC AB 2-0 CTX 27 (SUTURE) IMPLANT
SUT VIC AB 3-0 SH 27 (SUTURE)
SUT VIC AB 3-0 SH 27X BRD (SUTURE) IMPLANT
SUT VIC AB 3-0 X1 27 (SUTURE) IMPLANT
SUT VICRYL 4-0 PS2 18IN ABS (SUTURE) IMPLANT
SUTURE E-PAK OPEN HEART (SUTURE) ×2 IMPLANT
SYSTEM SAHARA CHEST DRAIN ATS (WOUND CARE) ×2 IMPLANT
TAPE CLOTH SURG 4X10 WHT LF (GAUZE/BANDAGES/DRESSINGS) ×1 IMPLANT
TAPE PAPER 2X10 WHT MICROPORE (GAUZE/BANDAGES/DRESSINGS) ×1 IMPLANT
TOWEL OR 17X24 6PK STRL BLUE (TOWEL DISPOSABLE) ×4 IMPLANT
TOWEL OR 17X26 10 PK STRL BLUE (TOWEL DISPOSABLE) ×4 IMPLANT
TRAY FOLEY IC TEMP SENS 14FR (CATHETERS) ×2 IMPLANT
TUBE SUCT INTRACARD DLP 20F (MISCELLANEOUS) ×2 IMPLANT
TUBING INSUFFLATION 10FT LAP (TUBING) ×2 IMPLANT
UNDERPAD 30X30 INCONTINENT (UNDERPADS AND DIAPERS) ×2 IMPLANT
VALVE MAGNA EASE AORTIC 25MM (Prosthesis & Implant Heart) ×1 IMPLANT
WATER STERILE IRR 1000ML POUR (IV SOLUTION) ×4 IMPLANT

## 2012-07-11 NOTE — Progress Notes (Signed)
PM Rounds  Still intubated after CABG/AVR -- past hx + for smoking,obesity COPD Hemodynamics stable Cont to try to wean off vent

## 2012-07-11 NOTE — Transfer of Care (Signed)
Immediate Anesthesia Transfer of Care Note  Patient: [name]  Procedure(s) Performed: Procedure(s) (LRB) with comments: AORTIC VALVE REPLACEMENT (AVR) (N/A) CORONARY ARTERY BYPASS GRAFTING (CABG) (N/A) - CABG x one,  using right leg greater saphenous vein harvested endoscopically INTRAOPERATIVE TRANSESOPHAGEAL ECHOCARDIOGRAM (N/A)  Patient Location: SICU  Anesthesia Type:General  Level of Consciousness: sedated and Patient remains intubated per anesthesia plan  Airway & Oxygen Therapy: Patient remains intubated per anesthesia plan and Patient placed on Ventilator (see vital sign flow sheet for setting)  Post-op Assessment: Report given to SICU RN, Post -op Vital signs reviewed and stable  Post vital signs: Reviewed and stable  Complications: No apparent anesthesia complications

## 2012-07-11 NOTE — Progress Notes (Signed)
  Echocardiogram Echocardiogram Transesophageal has been performed.  Mauricio Po 07/11/2012, 10:44 AM

## 2012-07-11 NOTE — Anesthesia Postprocedure Evaluation (Signed)
  Anesthesia Post-op Note  Patient: [name]  Procedure(s) Performed: Procedure(s) (LRB) with comments: AORTIC VALVE REPLACEMENT (AVR) (N/A) CORONARY ARTERY BYPASS GRAFTING (CABG) (N/A) - CABG x one,  using right leg greater saphenous vein harvested endoscopically INTRAOPERATIVE TRANSESOPHAGEAL ECHOCARDIOGRAM (N/A)  Patient Location: SICU  Anesthesia Type:General  Level of Consciousness: sedated and Patient remains intubated per anesthesia plan  Airway and Oxygen Therapy: Patient placed on Ventilator (see vital sign flow sheet for setting)  Post-op Pain: none  Post-op Assessment: Post-op Vital signs reviewed, Patient's Cardiovascular Status Stable, Respiratory Function Stable and Patent Airway  Post-op Vital Signs: Reviewed and stable  Complications: No apparent anesthesia complications

## 2012-07-11 NOTE — Anesthesia Procedure Notes (Addendum)
Procedure Name: Intubation Date/Time: 07/11/2012 7:56 AM Performed by: Terrill Mohr Pre-anesthesia Checklist: Patient identified, Emergency Drugs available, Suction available and Patient being monitored Patient Re-evaluated:Patient Re-evaluated prior to inductionOxygen Delivery Method: Circle system utilized Preoxygenation: Pre-oxygenation with 100% oxygen Intubation Type: IV induction Ventilation: Mask ventilation with difficulty Laryngoscope Size: Mac and 4 (Glidescope) Grade View: Grade II Tube type: Oral Tube size: 8.0 mm Number of attempts: 1 Airway Equipment and Method: Video-laryngoscopy and Stylet Placement Confirmation: ETT inserted through vocal cords under direct vision,  positive ETCO2 and breath sounds checked- equal and bilateral Secured at: 22 (cm at upper gum) cm Tube secured with: Tape Dental Injury: Teeth and Oropharynx as per pre-operative assessment     Procedures: Right IJ Gordy Councilman Catheter Insertion:  [id number]: The patient was identified and consent obtained.  TO was performed, and full barrier precautions were used.  The skin was anesthetized with lidocaine-4cc plain with 25g needle.  Once the vein was located with the 22 ga. needle using ultrasound guidance , the wire was inserted into the vein.  The wire location was confirmed with ultrasound.  The tissue was dilated and the 8.5 [place] cordis catheter was carefully inserted. Afterwards Gordy Councilman catheter was inserted. PA catheter at 55cm.  The patient tolerated the procedure well.   CE

## 2012-07-11 NOTE — H&P (Signed)
[address]            [place], [address]          [phone]      PCP is Alonza Bogus, MD  Referring Provider is Fay Records, MD  Chief Complaint   Patient presents with   .  Coronary Artery Disease     eval and treat....cathed 06/16/12...2D ECHO 05/16/12   .  Aortic Stenosis   HPI:  Mr. Deets is a 67 year old gentleman with a history of hypertension, non-obstructive coronary disease, previous heavy smoking with COPD, and morbid obesity who reports a 6-12 month history of progressively worsening dyspnea on exertion, exertional chest tightness, and dizziness with bending over. He had a 2-D echocardiogram done in November which showed moderate aortic stenosis with a valve area by continuity equation of 2.19 cm and a valve area of 1.27 cm by pressure half-time. There is mild aortic annular calcification and the aortic valve leaflets were moderately thickened and calcified. Compared to his previous study of 09/24/2008 there was modest progression of his aortic valve disease. Due to his progressive symptoms he underwent cardiac catheterization which showed normal right heart pressures. The mean aortic valve gradient was 30 mm mercury with a calculated aortic valve area of 1 cm with a valve index of 0.43. Cardiac index was 2 L per minute per meter squared. Coronary angiography showed a 90% ostial dominant left circumflex stenosis. The LAD had mild narrowing of the ostium that looked to be about 40% at the most. The right coronary artery is a small nondominant vessel without disease. His left ventricular ejection fraction by echocardiogram was 65-70%.  Past Medical History   Diagnosis  Date   .  Hypertension    .  Thyroid disease    .  Arthritis    .  COPD (chronic obstructive pulmonary disease)    .  Heart murmur    .  Aortic stenosis    .  CAD (coronary artery disease)    .  Syncope    .  Depression    .  Kidney stones     Past Surgical History    Procedure  Date   .  Joint replacement: Bilateral knee replacement in 2010    .  Angiopasty    .  Carotid endarterectomy on left by Dr. Trula Slade 4 years ago    .  Cardiac catheterization 2004 without significant obstructive disease     Family History   Problem  Relation  Age of Onset   .  Heart disease  Mother    .  Heart disease  Father    .  Hypertension  Father    Social History  History   Substance Use Topics   .  Smoking status:  Former Smoker -- 3.0 packs/day for 50 years     Types:  Cigarettes     Quit date:  12/13/2010   .  Smokeless tobacco:  Not on file      Comment: Electronic cigarette...USES INFREQUENTLY NOW   .  Alcohol Use:  Yes      Comment: Rare   Drinks 18 20 oz. Bottles of regular Sun Drop soda a week.  Current Outpatient Prescriptions   Medication  Sig  Dispense  Refill   .  albuterol (PROVENTIL HFA;VENTOLIN HFA) 108 (90 BASE) MCG/ACT inhaler  Inhale 2 puffs into  the lungs every 6 (six) hours as needed.     Marland Kitchen  aspirin 325 MG tablet  Take 325 mg by mouth daily.     .  cholecalciferol (VITAMIN D) 1000 UNITS tablet  Take 2,000 Units by mouth daily.     .  Cyanocobalamin (B-12) 2500 MCG TABS  Take 2,500 mcg by mouth daily.     .  furosemide (LASIX) 40 MG tablet  Take 20 mg by mouth daily.     .  Multiple Vitamin (MULTIVITAMIN) tablet  Take 1 tablet by mouth daily.     Marland Kitchen  olmesartan (BENICAR) 40 MG tablet  Take 20 mg by mouth daily.     .  Omega-3 Fatty Acids (FISH OIL) 1200 MG CAPS  Take 2,400 mg by mouth daily.     .  potassium chloride SA (K-DUR,KLOR-CON) 20 MEQ tablet  Take 20 mEq by mouth daily.     .  sertraline (ZOLOFT) 100 MG tablet  Take 100 mg by mouth daily.     .  simvastatin (ZOCOR) 40 MG tablet  Take 40 mg by mouth every evening.     Marland Kitchen  SPIRIVA HANDIHALER 18 MCG inhalation capsule  Place 18 mcg into inhaler and inhale daily.     Marland Kitchen  SYNTHROID 125 MCG tablet  Take 250 mcg by mouth daily.     No Known Allergies  Review of Systems  Constitutional:  Positive for activity change, fatigue and unexpected weight change. Negative for fever, chills, diaphoresis and appetite change.  HENT: Negative.  Eyes: Negative.  Respiratory: Positive for cough, chest tightness and shortness of breath.  Exertional such as walking  Cardiovascular: Positive for chest pain and leg swelling. Negative for palpitations.  Gastrointestinal:  Long hx of bleeding hemorrhoids worse when on coumadin after knee replacements.  Genitourinary: Negative.  Musculoskeletal: Positive for arthralgias and gait problem.  Skin: Negative.  Neurological: Positive for dizziness.  Numbness in hands  Hematological: Negative.  Psychiatric/Behavioral:  Hx of depression and anxiety.  BP 138/79  Pulse 81  Temp 97.4 F (36.3 C) (Oral)  Resp 18  Ht 5\' 10"  (1.778 m)  Wt 295 lb (133.811 kg)  BMI 42.33 kg/m2  SpO2 92%  Physical Exam  Constitutional: He is oriented to person, place, and time.  Obese gentleman in no distress.  HENT:  Head: Normocephalic and atraumatic.  Mouth/Throat: Oropharynx is clear and moist.  Eyes: Conjunctivae normal and EOM are normal. Pupils are equal, round, and reactive to light.  Neck: Normal range of motion. No JVD present. No tracheal deviation present. No thyromegaly present.  Cardiovascular: Normal rate, regular rhythm and intact distal pulses.  Murmur heard. 3/6 systolic murmur over aorta radiating into both sides of neck.  Pulmonary/Chest: Effort normal and breath sounds normal. No respiratory distress. He has no wheezes. He has no rales.  Abdominal: Soft. Bowel sounds are normal. He exhibits no distension and no mass. There is no tenderness.  obese  Musculoskeletal: Normal range of motion. He exhibits no edema.  Lymphadenopathy:  He has no cervical adenopathy.  Neurological: He is alert and oriented to person, place, and time. He has normal strength. No cranial nerve deficit or sensory deficit.  Skin: Skin is warm and dry.  Psychiatric: He  has a normal mood and affect.  Diagnostic Tests:  ------------------------------------------------------------ Transthoracic Echocardiography  Patient: Keyvan, Deveaux MR #: [id number] Study Date: 05/16/2012 Gender: M Age: 48 Height: 177.8cm Weight: 135.2kg BSA: 2.58m^2 Pt. Status: Room:  SONOGRAPHER Linford,  [name] REFERRING Alonza Bogus PERFORMING Aneta Mins [place] cc:  ------------------------------------------------------------ LV EF: 65% - 70%  ------------------------------------------------------------ Indications: Edema 782.3.  ------------------------------------------------------------ History: PMH: Dyspnea and murmur.  ------------------------------------------------------------ Study Conclusions  - Left ventricle: The cavity size was normal. There was mild concentric hypertrophy. Systolic function was vigorous. The estimated ejection fraction was in the range of 65% to 70%. Wall motion was normal; there were no regional wall motion abnormalities. - Aortic valve: Mildly calcified annulus. Moderately thickened, moderately calcified leaflets. Cusp separation was moderately reduced. There was moderate stenosis. - Mitral valve: Moderately to severely calcified annulus. Valve area by pressure half-time: 1.27cm^2. Valve area by continuity equation (using LVOT flow): 2.19cm^2. - Left atrium: The atrium was mildly dilated. - Atrial septum: No defect or patent foramen ovale was identified. Impressions:  - Compared to the prior study performed 09/24/2008, there has been modest progression of AV disease.  ------------------------------------------------------------ Labs, prior tests, procedures, and surgery: angioplasty in 1969 and 2005 Transthoracic echocardiography. M-mode, complete 2D, spectral Doppler, and color Doppler. Height: Height: 177.8cm. Height: 70in. Weight: Weight: 135.2kg. Weight:  297.4lb. Body mass index: BMI: 42.8kg/m^2. Body surface area: BSA: 2.89m^2. Patient status: Outpatient. Location: Echo laboratory.  ------------------------------------------------------------  ------------------------------------------------------------ Left ventricle: The cavity size was normal. There was mild concentric hypertrophy. Systolic function was vigorous. The estimated ejection fraction was in the range of 65% to 70%. Wall motion was normal; there were no regional wall motion abnormalities.  ------------------------------------------------------------ Aortic valve: Mildly calcified annulus. Moderately thickened, moderately calcified leaflets. Cusp separation was moderately reduced. Doppler: There was moderate stenosis. No regurgitation.  ------------------------------------------------------------ Aorta: The aorta was mildly calcified. Aortic root: The aortic root was normal in size. Ascending aorta: The ascending aorta was normal in size.  ------------------------------------------------------------ Mitral valve: Moderately to severely calcified annulus. Leaflet separation was normal. Doppler: Transvalvular velocity was within the normal range. There was no evidence for stenosis. No regurgitation. Valve area by pressure half-time: 1.27cm^2. Indexed valve area by pressure half-time: 0.51cm^2/m^2. Valve area by continuity equation (using LVOT flow): 2.19cm^2. Indexed valve area by continuity equation (using LVOT flow): 0.89cm^2/m^2. Mean gradient: 24mm Hg (D). Peak gradient: 69mm Hg (D).  ------------------------------------------------------------ Left atrium: The atrium was mildly dilated.  ------------------------------------------------------------ Atrial septum: No defect or patent foramen ovale was identified.  ------------------------------------------------------------ Right ventricle: The cavity size was normal. Wall thickness was normal. Systolic  function was normal.  ------------------------------------------------------------ Pulmonic valve: Structurally normal valve. Cusp separation was normal. Doppler: Transvalvular velocity was within the normal range. No regurgitation.  ------------------------------------------------------------ Tricuspid valve: Structurally normal valve. Leaflet separation was normal. Doppler: Transvalvular velocity was within the normal range. Trivial regurgitation.  ------------------------------------------------------------ Pulmonary artery: The main pulmonary artery was normal-sized. Systolic pressure could not be accurately estimated.  ------------------------------------------------------------ Right atrium: The atrium was normal in size.  ------------------------------------------------------------ Pericardium: There was no pericardial effusion.  ------------------------------------------------------------ Systemic veins: Not visualized.  ------------------------------------------------------------ Post procedure conclusions Ascending Aorta:  - The aorta was mildly calcified.  ------------------------------------------------------------  2D measurements Normal Doppler measurements Normal Left ventricle LVOT LVID ED, 49.8 mm 43-52 Peak vel, 152 cm/s ------ chord, S PLAX VTI, S 40.9 cm ------ LVID ES, 37.1 mm 23-38 Peak 9 mm Hg ------ chord, gradient, PLAX S FS, chord, 26 % >29 Aortic valve PLAX VTI, S 57.9 cm ------ LVPW, ED 14.8 mm ------ Mitral valve IVS/LVPW 1.03 <1.3 Peak E vel 91.9 cm/s ------ ratio, ED Peak A vel 160 cm/s ------ Ventricular septum Mean vel, 102 cm/s ------  IVS, ED 15.3 mm ------ D LVOT Decelerati 479 ms 150-23 Diam, S 20 mm ------ on time 0 Area 3.14 cm^2 ------ Pressure 173 ms ------ Aorta half-time Root diam, 32 mm ------ Mean 5 mm Hg ------ ED gradient, Left atrium D AP dim 49 mm ------ Peak 12 mm Hg ------ AP dim 1.98 cm/m^2 <2.2  gradient, index D Peak E/A 0.6 ------ M-mode measurements Normal ratio Aorta Area (PHT) 1.27 cm^2 ------ Root diam, 30 mm 20-37 Area index 0.51 cm^2/m ------ ED (PHT) ^2 Left atrium Area 2.19 cm^2 ------ AP dim, ES 58 mm 19-40 (LVOT) AP dim 2.35 cm/m^2 <2.2 continuity index, ES Area index 0.89 cm^2/m ------ LA/Ao root 1.93 ------ (LVOT ^2 ratio cont) Annulus 58.6 cm ------ VTI  ------------------------------------------------------------ Prepared and Electronically Authenticated by  Jacqulyn Ducking 2013-11-18T19:39:[id number]  Cardiac Cath:  Procedural Findings:  Hemodynamics  RA 13/9 with a mean of 9 mmHg  RV 34/10 mmHg  PA 36/18 with a mean of 26 mmHg  PCWP 19/17 with a mean of 16 mmHg  LV 140/21 mmHg  AO 107/57 with a mean of 77 mmHg  Mean aortic valve gradient is 30 mmHg. Aortic valve area is 1 cm square with a valve index of 0.43  There is no significant mitral valve gradient  Oxygen saturations:  PA 61%  AO 97%  Cardiac Output (Fick) 4.9 L per minute  Cardiac Index (Fick) 2 L per minute per meter square  Coronary angiography:  Coronary dominance: right  Left mainstem: There is 40% stenosis in the distal left main.  Left anterior descending (LAD): The LAD is a large vessel which extends to the apex. It gives rise to a moderately large first diagonal branch. There is 40-50% narrowing of the ostium of the LAD.  Left circumflex (LCx): The left circumflex is a dominant vessel. It has a 90% ostial stenosis. The remainder the vessel is without significant disease.  Right coronary artery (RCA): This is a small nondominant vessel and is normal.  Left ventriculography: Not performed due to to renal insufficiency  Final Conclusions:  1. 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  2. Severe aortic stenosis  3. Normal right heart pressures.  Impression/Plan:  Mr. Mcgarty has severe aortic stenosis and high-grade  single-vessel coronary disease with progressive symptoms of exertional chest tightness, dyspnea, and dizziness. I agree that aortic valve replacement and coronary bypass graft surgery to the left circumflex coronary system is indicated to prevent further ischemia, infarction, and sudden death, as well as to improve his quality of life. I don't think the ostial LAD lesion is significant at this time. It hasn't really changed since cath in 2004. I think a LIMA graft to the LAD would fail. I discussed the pros and cons of mechanical and tissue valves with the patient and his wife. I think that at his age of 72 with coronary disease, morbid obesity, and significant COPD that a tissue valve would likely last him the rest of his life. I would be concerned about anticoagulation for this patient due to his history of bleeding hemorrhoids which he said was a real problem when he was on Coumadin previously after knee replacement surgery. He and his wife are in agreement that a tissue valve would be best for him. I discussed the operative procedure with the patient and family including alternatives, benefits and risks; including but not limited to bleeding, blood transfusion, infection, stroke, myocardial infarction, graft failure, heart  block requiring a permanent pacemaker, organ dysfunction, and death. Ronette Deter understands and agrees to proceed. We will schedule surgery for Monday July 11, 2012.

## 2012-07-11 NOTE — Progress Notes (Signed)
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

## 2012-07-11 NOTE — Interval H&P Note (Signed)
History and Physical Interval Note:  07/11/2012 7:32 AM  [name]  has presented today for surgery, with the diagnosis of AORTIC STENOSIS, CAD  The various methods of treatment have been discussed with the patient and family. After consideration of risks, benefits and other options for treatment, the patient has consented to  Procedure(s) (LRB) with comments: AORTIC VALVE REPLACEMENT (AVR) (N/A) CORONARY ARTERY BYPASS GRAFTING (CABG) (N/A) INTRAOPERATIVE TRANSESOPHAGEAL ECHOCARDIOGRAM (N/A) as a surgical intervention .  The patient's history has been reviewed, patient examined, no change in status, stable for surgery.  I have reviewed the patient's chart and labs.  Questions were answered to the patient's satisfaction.     [name],[name]  He bent over to pick up a dog toy last Thursday and just about passed out falling on his right hip. He could barely walk afterward but it has gotten better. He is otherwise ready to proceed.

## 2012-07-11 NOTE — Progress Notes (Signed)
Pt. Stated last wed.1/8 he bent over to pick up an object, blacked out and fell on his right hip. Stated it was hurting a lot last week, especially when he walks. He reports today that the hip is still hurting but is better since last week. Notified Dr. Cyndia Bent. No new orders.

## 2012-07-11 NOTE — Preoperative (Signed)
Beta Blockers   Reason not to administer Beta Blockers:Lopressor 0530 today

## 2012-07-11 NOTE — OR Nursing (Signed)
12:50pm - call to vol. Desk to inform pt. Family off pump, 1st call to SICU.  2nd call to SICU @ 13:16

## 2012-07-11 NOTE — Progress Notes (Addendum)
Patient back on full vent support following acidotic ABG pH (7.31) due to hypoventilation on CPAP/PS.  Vt increased to 730 cc.  Will re-attempt weaning later.

## 2012-07-11 NOTE — Anesthesia Preprocedure Evaluation (Addendum)
Anesthesia Evaluation  Patient identified by MRN, date of birth, ID band Patient awake    Reviewed: Allergy & Precautions, H&P , NPO status , Patient's Chart, lab work & pertinent test results, reviewed documented beta blocker date and time   Airway Mallampati: II TM Distance: >3 FB Neck ROM: Full    Dental  (+) Missing and Dental Advisory Given   Pulmonary COPD COPD inhaler,  breath sounds clear to auscultation        Cardiovascular hypertension, Pt. on medications + CAD + Valvular Problems/Murmurs AS     Neuro/Psych    GI/Hepatic   Endo/Other  Hypothyroidism Took rx this a.m.  Renal/GU Renal InsufficiencyRenal diseaseCreatinine noted     Musculoskeletal   Abdominal   Peds  Hematology   Anesthesia Other Findings Missing several teeth upper front.  Facial Hair ?issue with mask ventilation...  Reproductive/Obstetrics                        Anesthesia Physical Anesthesia Plan  ASA: IV  Anesthesia Plan: General   Post-op Pain Management:    Induction: Intravenous  Airway Management Planned: Oral ETT  Additional Equipment:   Intra-op Plan:   Post-operative Plan:   Informed Consent: I have reviewed the patients History and Physical, chart, labs and discussed the procedure including the risks, benefits and alternatives for the proposed anesthesia with the patient or authorized representative who has indicated his/her understanding and acceptance.   Dental advisory given  Plan Discussed with: Anesthesiologist and CRNA  Anesthesia Plan Comments:         Anesthesia Quick Evaluation

## 2012-07-11 NOTE — Brief Op Note (Signed)
07/11/2012  12:19 PM  PATIENT:  [name]  67 y.o. male  PRE-OPERATIVE DIAGNOSIS:  AORTIC STENOSIS, CAD  POST-OPERATIVE DIAGNOSIS:  AORTIC STENOSIS, CAD  PROCEDURE:    AORTIC VALVE REPLACEMENT (27 mm Edwards Magna Ease pericardial tissue valve)  CORONARY ARTERY BYPASS GRAFTING x 1 (SVG-OM)  EVH RIGHT THIGH  SURGEON:  Surgeon(s): Gaye Pollack, MD  ASSISTANT: Suzzanne Cloud, PA-C  ANESTHESIA:   general  PATIENT CONDITION:  ICU - intubated and hemodynamically stable.  PRE-OPERATIVE WEIGHT: 135 kg

## 2012-07-12 ENCOUNTER — Encounter (HOSPITAL_COMMUNITY): Payer: Self-pay | Admitting: Surgery

## 2012-07-12 ENCOUNTER — Inpatient Hospital Stay (HOSPITAL_COMMUNITY): Payer: Managed Care, Other (non HMO)

## 2012-07-12 LAB — BASIC METABOLIC PANEL
BUN: 42 mg/dL — ABNORMAL HIGH (ref 6–23)
CO2: 21 mEq/L (ref 19–32)
Calcium: 8.3 mg/dL — ABNORMAL LOW (ref 8.4–10.5)
GFR calc non Af Amer: 32 mL/min — ABNORMAL LOW (ref >90)
Glucose, Bld: 132 mg/dL — ABNORMAL HIGH (ref 70–99)
Sodium: 141 mEq/L (ref 135–145)

## 2012-07-12 LAB — POCT I-STAT 3, ART BLOOD GAS (G3+)
Acid-base deficit: 2 mmol/L (ref 0.0–2.0)
Acid-base deficit: 2 mmol/L (ref 0.0–2.0)
Acid-base deficit: 2 mmol/L (ref 0.0–2.0)
Acid-base deficit: 3 mmol/L — ABNORMAL HIGH (ref 0.0–2.0)
Bicarbonate: 24.3 mEq/L — ABNORMAL HIGH (ref 20.0–24.0)
Bicarbonate: 23.3 mEq/L (ref 20.0–24.0)
Bicarbonate: 23.8 mEq/L (ref 20.0–24.0)
O2 Saturation: 97 %
O2 Saturation: 97 %
Patient temperature: 36.9
Patient temperature: 37.1
Patient temperature: 37.2
TCO2: 25 mmol/L (ref 0–100)
TCO2: 25 mmol/L (ref 0–100)
TCO2: 25 mmol/L (ref 0–100)
TCO2: 26 mmol/L (ref 0–100)
pCO2 arterial: 46.4 mmHg — ABNORMAL HIGH (ref 35.0–45.0)
pCO2 arterial: 45.7 mmHg — ABNORMAL HIGH (ref 35.0–45.0)
pH, Arterial: 7.342 — ABNORMAL LOW (ref 7.350–7.450)
pO2, Arterial: 89 mmHg (ref 80.0–100.0)
pO2, Arterial: 101 mmHg — ABNORMAL HIGH (ref 80.0–100.0)
pO2, Arterial: 97 mmHg (ref 80.0–100.0)

## 2012-07-12 LAB — GLUCOSE, CAPILLARY
Glucose-Capillary: 143 mg/dL — ABNORMAL HIGH (ref 70–99)
Glucose-Capillary: 117 mg/dL — ABNORMAL HIGH (ref 70–99)
Glucose-Capillary: 107 mg/dL — ABNORMAL HIGH (ref 70–99)
Glucose-Capillary: 111 mg/dL — ABNORMAL HIGH (ref 70–99)
Glucose-Capillary: 139 mg/dL — ABNORMAL HIGH (ref 70–99)
Glucose-Capillary: 106 mg/dL — ABNORMAL HIGH (ref 70–99)
Glucose-Capillary: 117 mg/dL — ABNORMAL HIGH (ref 70–99)
Glucose-Capillary: 113 mg/dL — ABNORMAL HIGH (ref 70–99)
Glucose-Capillary: 155 mg/dL — ABNORMAL HIGH (ref 70–99)
Glucose-Capillary: 124 mg/dL — ABNORMAL HIGH (ref 70–99)
Glucose-Capillary: 127 mg/dL — ABNORMAL HIGH (ref 70–99)

## 2012-07-12 LAB — CBC
HCT: 33.2 % — ABNORMAL LOW (ref 39.0–52.0)
HCT: 31.8 % — ABNORMAL LOW (ref 39.0–52.0)
Hemoglobin: 11.2 g/dL — ABNORMAL LOW (ref 13.0–17.0)
Hemoglobin: 10.6 g/dL — ABNORMAL LOW (ref 13.0–17.0)
MCH: 30.4 pg (ref 26.0–34.0)
MCHC: 33.7 g/dL (ref 30.0–36.0)
MCHC: 33.3 g/dL (ref 30.0–36.0)
MCV: 90.2 fL (ref 78.0–100.0)
MCV: 92.4 fL (ref 78.0–100.0)
RBC: 3.68 MIL/uL — ABNORMAL LOW (ref 4.22–5.81)

## 2012-07-12 LAB — POCT I-STAT, CHEM 8
HCT: 30 % — ABNORMAL LOW (ref 39.0–52.0)
Hemoglobin: 10.2 g/dL — ABNORMAL LOW (ref 13.0–17.0)
Potassium: 4.6 mEq/L (ref 3.5–5.1)
Sodium: 142 mEq/L (ref 135–145)
TCO2: 23 mmol/L (ref 0–100)

## 2012-07-12 LAB — URINE CULTURE: Culture: NO GROWTH

## 2012-07-12 LAB — CREATININE, SERUM: GFR calc non Af Amer: 34 mL/min — ABNORMAL LOW (ref >90)

## 2012-07-12 MED ORDER — INSULIN ASPART 100 UNIT/ML ~~LOC~~ SOLN
0.0000 [IU] | SUBCUTANEOUS | Status: DC
  Administered 2012-07-12 – 2012-07-13 (×3): 2 [IU] via SUBCUTANEOUS

## 2012-07-12 MED ORDER — INSULIN DETEMIR 100 UNIT/ML ~~LOC~~ SOLN
20.0000 [IU] | Freq: Every day | SUBCUTANEOUS | Status: DC
  Filled 2012-07-12: qty 10

## 2012-07-12 MED ORDER — SODIUM BICARBONATE 8.4 % IV SOLN: 50.0000 meq | INTRAVENOUS | Status: DC | PRN

## 2012-07-12 MED ORDER — INSULIN DETEMIR 100 UNIT/ML ~~LOC~~ SOLN
20.0000 [IU] | Freq: Every day | SUBCUTANEOUS | Status: DC
  Administered 2012-07-12 – 2012-07-13 (×2): 20 [IU] via SUBCUTANEOUS
  Filled 2012-07-12: qty 10

## 2012-07-12 MED FILL — Potassium Chloride Inj 2 mEq/ML: INTRAVENOUS | Qty: 40 | Status: AC

## 2012-07-12 MED FILL — Magnesium Sulfate Inj 50%: INTRAMUSCULAR | Qty: 10 | Status: AC

## 2012-07-12 NOTE — Progress Notes (Addendum)
1 Day Post-Op Procedure(s) (LRB): AORTIC VALVE REPLACEMENT (AVR) (N/A) CORONARY ARTERY BYPASS GRAFTING (CABG) (N/A) INTRAOPERATIVE TRANSESOPHAGEAL ECHOCARDIOGRAM (N/A) Subjective:  Mr. [name] was extubated this morning.  He states he is doing okay.  He does complain of some mild pain which is controlled with pain medication  Objective: Vital signs in last 24 hours: Temp:  [96.8 F (36 C)-99.3 F (37.4 C)] 99.3 F (37.4 C) (01/14 0700) Pulse Rate:  [74-90] 90  (01/14 0700) Cardiac Rhythm:  [-] Normal sinus rhythm;Atrial paced (01/13 2000) Resp:  [8-20] 16  (01/14 0700) BP: (83-132)/(50-69) 96/69 mmHg (01/14 0700) SpO2:  [91 %-100 %] 96 % (01/14 0700) Arterial Line BP: (87-144)/(42-61) 109/49 mmHg (01/14 0700) FiO2 (%):  [40 %-50 %] 40 % (01/14 0630) Weight:  [301 lb 5.9 oz (136.7 kg)] 301 lb 5.9 oz (136.7 kg) (01/14 0630)  Hemodynamic parameters for last 24 hours: PAP: (25-38)/(14-27) 29/16 mmHg CO:  [4.9 L/min-9.3 L/min] 8.2 L/min CI:  [2 L/min/m2-3.8 L/min/m2] 3.3 L/min/m2  Intake/Output from previous day: 01/13 0701 - 01/14 0700 In: 7219.2 [I.V.:5274.2; Blood:1015; NG/GT:30; IV Piggyback:900] Out: 6105 [Urine:4285; Blood:1600; Chest Tube:220]  General appearance: alert, cooperative and no distress Neurologic: intact Heart: regular rate and rhythm Lungs: diminished breath sounds bilaterally Abdomen: soft, non-tender; bowel sounds normal; no masses,  no organomegaly Extremities: edema trace Wound: clean and dry  Lab Results:  Basename 07/12/12 0300 07/11/12 2117 07/11/12 2100  WBC 12.0* -- 12.4*  HGB 11.2* 12.9* --  HCT 33.2* 38.0* --  PLT 147* -- 143*   BMET:  Basename 07/12/12 0300 07/11/12 2117  NA 141 135  K 5.1 4.5  CL 107 115*  CO2 21 --  GLUCOSE 132* 128*  BUN 42* 43*  CREATININE 2.02* 1.90*  CALCIUM 8.3* --    PT/INR:  Basename 07/11/12 1400  LABPROT 18.2*  INR 1.56*   ABG    Component Value Date/Time   PHART 7.326* 07/12/2012 0656   HCO3  23.8 07/12/2012 0656   TCO2 25 07/12/2012 0656   ACIDBASEDEF 2.0 07/12/2012 0656   O2SAT 97.0 07/12/2012 0656   CBG (last 3)   Basename 07/12/12 0145 07/12/12 0026 07/11/12 2239  GLUCAP 143* 123* 128*    Assessment/Plan: S/P Procedure(s) (LRB): AORTIC VALVE REPLACEMENT (AVR) (N/A) CORONARY ARTERY BYPASS GRAFTING (CABG) (N/A) INTRAOPERATIVE TRANSESOPHAGEAL ECHOCARDIOGRAM (N/A)  1. CV- paced, remains on NTG, Neo, Dopamine- will wean drips as tolerated 2. Pulm- worsening atelectasis on CXR this morning- encouraged aggressive IS use 3. Renal- Creatinine up to 2.02, good urinary output- will follow 4. Expected Blood Loss Anemia- mild 5. DM- CBGs controlled, wean insulin drip as tolerated, start Levemir 6. D/C Chest tubes, Arterial line, Swan    LOS: 1 day    Ellwood Handler 07/12/2012    Chart reviewed, patient examined, agree with above. His baseline creat is 1.8 preop. It was 1.45 on 06/09/12. I do not plan to use coumadin for him.

## 2012-07-12 NOTE — Op Note (Signed)
[name], [name]                 ACCOUNT NO.:  [id number]  MEDICAL RECORD NO.:  [id number]  LOCATION:  [id number]                         FACILITY:  [facility]  PHYSICIAN:  Gilford Raid, M.D.     DATE OF BIRTH:  09-21-1945  DATE OF PROCEDURE:  07/11/2012 DATE OF DISCHARGE:                              OPERATIVE REPORT   PREOPERATIVE DIAGNOSES: 1. Severe aortic stenosis. 2. Severe single-vessel coronary artery disease.  POSTOPERATIVE DIAGNOSIS: 1. Severe aortic stenosis. 2. Severe single-vessel coronary artery disease.  OPERATIVE PROCEDURE:  Median sternotomy, extracorporeal circulation, coronary artery bypass graft surgery x1 using a saphenous vein graft to the obtuse marginal branch of left circumflex coronary artery, aortic valve replacement using a 25 mm Edwards pericardial Magna-Ease valve. Endoscopic vein harvesting from the right leg.  ATTENDING SURGEON:  Gilford Raid, MD  ASSISTANT:  Suzzanne Cloud, PA-C  ANESTHESIA:  General endotracheal.  CLINICAL HISTORY:  This patient is a 67 year old gentleman with history of hypertension, nonobstructive coronary disease, previous heavy smoking, COPD, morbid obesity, who reports 6-12 months history of progressively worsening dyspnea on exertion, exertional chest tightness, and dizziness with bending over.  A 2-D echocardiogram in November showed moderate aortic stenosis with a valve area by continuity equation of 2.19 cm2 and a valve area of 1.27 cm2 by pressure half time.  There was mild aortic annular calcification and aortic valve leaflets were moderately thickened and calcified.  Compared to a previous study in March, 2010, there is modest progression of his aortic valve disease. Due to progression of symptoms, he underwent cardiac catheterization showed normal right heart pressures.  The mean aortic valve gradient was 30.  Calculated valve area was 1 cm2 with a valve index of 0.43. Coronary angiography showed a 90% ostial dominant  left circumflex stenosis.  The LAD had mild narrowing at the ostium of the right.  There was about 40% at the most.  The right coronary artery was a small nondominant vessel without disease.  Left ventricular ejection fraction was 65% to 70% by echocardiogram.  After review of the studies and examination, the patient was felt that aortic valve replacement and bypass to the left circumflex system was the best treatment to improve his symptoms and quality of life and decrease his risk of ischemia, infarction, and death.  I discussed the operative procedure with the patient and his family.  We discussed the pros and cons of mechanical and tissue valves.  He did have a history of being on Coumadin for knee replacement surgery, and he did develop bleeding related to hemorrhoids which cause significant problem for him.  He decided that a tissue valve would be best for him, so he did not need to be on Coumadin.  I discussed the small, but real risk of structural valve deterioration over his lifetime requiring further intervention, or valve replacement. He understood all this and agreed to proceed.  I discussed the operative procedure in detail including alternatives, benefits, and risks including, but not limited to bleeding, blood transfusion, infection, stroke, myocardial infarction, organ dysfunction, and death.  He understood and agreed to proceed with surgery.  OPERATIVE PROCEDURE:  The patient was taken to the operating room  and placed on table in supine position.  After induction of general endotracheal anesthesia, a Foley catheter placed in bladder using sterile technique.  Then, the chest, abdomen, and both lower extremities were prepped and draped in usual sterile manner.  TEE was performed by Anesthesiology.  This was read by Dr. Finis Bud.  This showed severe calcific aortic stenosis with a valve area of 0.9 cm2.  Left ventricular function was well preserved.  Right  ventricular function was well preserved.  There was trivial mitral regurgitation.  Then, the chest was opened through a median sternotomy incision.  The pericardium was opened in the midline.  Examination of heart showed good ventricular contractility.  The ascending aorta was of normal size.  He did have a calcified plaque present anteriorly beginning just above the aortic anulus and extending up to the sino-tubular junction.  Then, a section of saphenous vein was harvested from the right leg using endoscopic vein harvest technique.  This vein was of medium to large caliber and good quality.  Then, the patient was heparinized when an adequate ACT was obtained. The distal ascending aorta was cannulated using a 24-French aortic cannula for arterial inflow.  Venous outflow was achieved using a two- stage venous cannula for the right atrial appendage.  An antegrade cardioplegia and neck cannula was inserted in the aortic root.  A left ventricular vent was placed through right superior pulmonary vein, and a retrograde cardioplegic cannula was placed through a pursestring suture in the right atrium and advanced in the coronary sinus without difficulty.  The patient was placed on cardiopulmonary bypass and distal coronaries identified.  The intermediate vessel seen on catheterizations was intramyocardial in its proximal portion beneath the large vein.  The vessel was further visualized where actually the muscle where was a tiny nongraftable vessel.  The patient had 2 marginal branches, but appeared to communicate on catheterizations and one was significantly larger was chosen for grafting.  The distal left circumflex or posterior descending branch was intramyocardial and not visualized.  Then, the aorta was crossclamped and 1000 mL of cold blood antegrade cardioplegia was administered in the aortic root with quick arrest of the heart.  Systemic hypothermia to 20 degrees centigrade and  topical hypothermia with iced saline was used.  Then, the first distal anastomosis was performed to the 2nd obtuse marginal branch.  The internal ring vessel was 1.75 mm.  The conduit used was a segment of greater saphenous vein and anastomosis performed in end-to-side manner using continuous 7-0 Prolene suture.  Flow was noted through the graft and was excellent.  Then, another dose cardioplegia was given down the vein grafts and aortic root.  Attention was then turned to aortic valve replacement.  Additional doses of cold blood retrograde cardioplegia was then readministered at about 20 minutes intervals maintaining myocardial temperature around 10 degrees centigrade.  Attention probe was placed in septum.  An insulating pad was placed in the pericardium.  Systemic hypothermia to 32 degrees centigrade was used.  Then, the aorta was opened transversely about 1 cm above the sino- tubular junction.  Visualization of the anterior aorta showed there was a large amount of calcified plaque anteriorly extending out from the area just above the anulus to the sino-tubular junction.  There was calcified plaque around the ostium of the right and left coronary arteries.  There was no visible obstruction.  The native valve leaflets were calcified and poorly mobile.  There is significant annular calcification.  Then, the valve leaflets  were excised.  The anulus was decalcified with rongeurs.  Care was taken to remove all particulate debris.  Left ventricle and aortic root were irrigated with iced saline solution and directly inspected for any debris and there was none.  Then, the anulus was sized and a 25 mm Edwards pericardial Magna-Ease valve was chosen.  This had model number 3300 TFX and serial [id number]. Then, a series of pledgeted 2-0 Ethibond horizontal mattress sutures were placed around the anulus with the pledgets in a subannular position.  Sutures placed through the sewing ring and the  valve lowered in place.  The sutures were tied sequentially.  The valve seated nicely. The right and left coronary ostia were not obstructed.  Then, the patient was rewarmed to 37 degrees centigrade.  The aortotomy was closed in 2 layers using continuous 4-0 Prolene suture with felt strips to reinforce the closure.  Then, the proximal vein graft anastomosis was performed to the mid ascending aorta in end-to-side manner using continuous 6-0 Prolene suture.  Then, the left side of the heart was de- aired.  The head was placed in Trendelenburg position and the crossclamp removed with time of 129 minutes.  There was spontaneous return of sinus rhythm.  The proximal and distal anastomoses appeared hemostatic and allowed the grafts satisfactory.  The aortotomy was hemostatic.  The graft markers placed around the proximal anastomosis.  Two temporary right ventricular and right atrial pacing wires placed and brought out through the skin.  When the patient rewarmed to 37 degrees centigrade, he was weaned from cardiopulmonary bypass on renal dosed dopamine at 2 mcg/kg per minute. Cardiac output was 7-8 L/minute.  TEE showed normal left ventricular function with trivial mitral regurgitation.  The aortic valve prosthesis with normal with no evidence of perivalvular leak or central regurgitation.  Then, protamine was given and the venous and aortic cannulas were removed without difficulty.  Hemostasis was achieved.  Two chest tubes were placed to the first posterior pericardium, one in the anterior mediastinum.  The sternum was then closed with double #6 stainless steel wires.  Fascia was closed with continuous #1 Vicryl suture.  Subcutaneous tissue was closed with continuous 2-0 Vicryl and the skin with 3-0 Vicryl subcuticular closure.  The sponge, needle, and instrument counts were correct according to the scrub nurse.  Dry sterile dressing was applied over the incisions around the chest  tubes, which were hooked to Pleur-Evac suction.  The patient remained hemodynamically stable and transferred to the SICU in guarded, but stable condition.     Gilford Raid, M.D.     BB/MEDQ  D:  07/11/2012  T:  07/12/2012  Job:  [id number]

## 2012-07-12 NOTE — Progress Notes (Signed)
Patient ID: [name], male   DOB: 1946/03/19, 67 y.o.   MRN: [id number]                   [address]            [place] [address]          [phone]     1 Day Post-Op Procedure(s) (LRB): AORTIC VALVE REPLACEMENT (AVR) (N/A) CORONARY ARTERY BYPASS GRAFTING (CABG) (N/A) INTRAOPERATIVE TRANSESOPHAGEAL ECHOCARDIOGRAM (N/A)  Total Length of Stay:  LOS: 1 day  BP 85/50  Pulse 92  Temp 98 F (36.7 C) (Oral)  Resp 21  Wt 301 lb 5.9 oz (136.7 kg)  SpO2 94%  .Intake/Output      01/13 0701 - 01/14 0700 01/14 0701 - 01/15 0700   P.O.  900   I.V. (mL/kg) 5274.2 (38.6) 317.5 (2.3)   Blood 1015    NG/GT 30    IV Piggyback 900 50   Total Intake(mL/kg) 7219.2 (52.8) 1267.5 (9.3)   Urine (mL/kg/hr) 4285 (1.3) 825 (0.5)   Blood 1600    Chest Tube 220 70   Total Output 6105 895   Net +1114.2 +372.5             . sodium chloride 20 mL/hr at 07/12/12 0700  . sodium chloride 20 mL/hr at 07/12/12 0700  . DOPamine 2.5 mcg/kg/min (07/12/12 1800)  . insulin (NOVOLIN-R) infusion 1.6 Units/hr (07/12/12 1217)  . lactated ringers    . nitroGLYCERIN 10 mcg/min (07/11/12 1418)  . phenylephrine (NEO-SYNEPHRINE) Adult infusion 10 mcg/min (07/12/12 1800)     Lab Results  Component Value Date   WBC 14.4* 07/12/2012   HGB 10.2* 07/12/2012   HCT 30.0* 07/12/2012   PLT 126* 07/12/2012   GLUCOSE 126* 07/12/2012   ALT 31 07/07/2012   AST 26 07/07/2012   NA 142 07/12/2012   K 4.6 07/12/2012   CL 109 07/12/2012   CREATININE 1.90* 07/12/2012   BUN 42* 07/12/2012   CO2 21 07/12/2012   TSH 0.070* 06/03/2012   INR 1.56* 07/11/2012   HGBA1C 6.4* 07/07/2012   CR 1.8 preop, tonight 1.9 still on neo and dopamine Adequate  Starting to walk in hall and up to chair   [place] [phone] Office [phone] 07/12/2012 6:47 PM

## 2012-07-12 NOTE — Progress Notes (Signed)
Patient placed back on full support following reattempt at weaning due to pH 7.297 on ABG.

## 2012-07-12 NOTE — Procedures (Signed)
Extubation Procedure Note  Patient Details:   Name: [name] DOB: July 07, 1945 MRN: [id number]   Airway Documentation:   Patient extubated to 4 lpm nasal cannula.  VC 1300 ml, NIF -40, patient able to hold head off bed 10 seconds.  Patient able to breathe around deflated cuff and vocalize post procedure.  Tolerated well, no complications.   Evaluation  O2 sats: stable throughout Complications: No apparent complications Patient did tolerate procedure well. Bilateral Breath Sounds: Rhonchi   Yes  [name], Elwyn Lade 07/12/2012, 7:15 AM

## 2012-07-13 ENCOUNTER — Inpatient Hospital Stay (HOSPITAL_COMMUNITY): Payer: Managed Care, Other (non HMO)

## 2012-07-13 LAB — CBC
MCH: 30.6 pg (ref 26.0–34.0)
MCV: 93.2 fL (ref 78.0–100.0)
Platelets: 120 10*3/uL — ABNORMAL LOW (ref 150–400)
RBC: 3.24 MIL/uL — ABNORMAL LOW (ref 4.22–5.81)
RDW: 13.6 % (ref 11.5–15.5)

## 2012-07-13 LAB — BASIC METABOLIC PANEL
Calcium: 8.4 mg/dL (ref 8.4–10.5)
Creatinine, Ser: 1.96 mg/dL — ABNORMAL HIGH (ref 0.50–1.35)
GFR calc Af Amer: 39 mL/min — ABNORMAL LOW (ref >90)
GFR calc non Af Amer: 34 mL/min — ABNORMAL LOW (ref >90)
Sodium: 138 mEq/L (ref 135–145)

## 2012-07-13 LAB — GLUCOSE, CAPILLARY: Glucose-Capillary: 103 mg/dL — ABNORMAL HIGH (ref 70–99)

## 2012-07-13 MED ORDER — METOPROLOL TARTRATE 12.5 MG HALF TABLET
12.5000 mg | ORAL_TABLET | Freq: Two times a day (BID) | ORAL | Status: DC
  Administered 2012-07-13 – 2012-07-15 (×4): 12.5 mg via ORAL
  Filled 2012-07-13 (×6): qty 1

## 2012-07-13 MED ORDER — DOCUSATE SODIUM 100 MG PO CAPS
200.0000 mg | ORAL_CAPSULE | Freq: Every day | ORAL | Status: DC
  Administered 2012-07-13 – 2012-07-14 (×2): 200 mg via ORAL
  Filled 2012-07-13 (×3): qty 2

## 2012-07-13 MED ORDER — SODIUM CHLORIDE 0.9 % IJ SOLN
3.0000 mL | Freq: Two times a day (BID) | INTRAMUSCULAR | Status: DC
  Administered 2012-07-13 – 2012-07-14 (×3): 3 mL via INTRAVENOUS

## 2012-07-13 MED ORDER — INSULIN ASPART 100 UNIT/ML ~~LOC~~ SOLN
0.0000 [IU] | SUBCUTANEOUS | Status: DC
  Administered 2012-07-14 (×2): 2 [IU] via SUBCUTANEOUS

## 2012-07-13 MED ORDER — ALBUTEROL SULFATE (5 MG/ML) 0.5% IN NEBU
INHALATION_SOLUTION | RESPIRATORY_TRACT | Status: AC
  Administered 2012-07-13: 2.5 mg
  Filled 2012-07-13: qty 0.5

## 2012-07-13 MED ORDER — ALBUTEROL SULFATE HFA 108 (90 BASE) MCG/ACT IN AERS
2.0000 | INHALATION_SPRAY | Freq: Four times a day (QID) | RESPIRATORY_TRACT | Status: DC | PRN
Start: 2012-07-13 — End: 2012-07-15

## 2012-07-13 MED ORDER — ONDANSETRON HCL 4 MG/2ML IJ SOLN: 4.0000 mg | Freq: Four times a day (QID) | INTRAMUSCULAR | Status: DC | PRN

## 2012-07-13 MED ORDER — ASPIRIN EC 325 MG PO TBEC
325.0000 mg | DELAYED_RELEASE_TABLET | Freq: Every day | ORAL | Status: DC
  Administered 2012-07-14 – 2012-07-15 (×2): 325 mg via ORAL
  Filled 2012-07-13 (×2): qty 1

## 2012-07-13 MED ORDER — OXYCODONE HCL 5 MG PO TABS
5.0000 mg | ORAL_TABLET | ORAL | Status: DC | PRN
Start: 2012-07-13 — End: 2012-07-15
  Administered 2012-07-14: 10 mg via ORAL
  Filled 2012-07-13: qty 2

## 2012-07-13 MED ORDER — ACETAMINOPHEN 325 MG PO TABS: 650.0000 mg | ORAL_TABLET | Freq: Four times a day (QID) | ORAL | Status: DC | PRN

## 2012-07-13 MED ORDER — PANTOPRAZOLE SODIUM 40 MG PO TBEC
40.0000 mg | DELAYED_RELEASE_TABLET | Freq: Every day | ORAL | Status: DC
  Administered 2012-07-14 – 2012-07-15 (×2): 40 mg via ORAL
  Filled 2012-07-13 (×3): qty 1

## 2012-07-13 MED ORDER — TRAMADOL HCL 50 MG PO TABS: 50.0000 mg | ORAL_TABLET | Freq: Four times a day (QID) | ORAL | Status: DC | PRN

## 2012-07-13 MED ORDER — BISACODYL 5 MG PO TBEC: 10.0000 mg | DELAYED_RELEASE_TABLET | Freq: Every day | ORAL | Status: DC | PRN

## 2012-07-13 MED ORDER — VITAMIN B-12 1000 MCG PO TABS
2500.0000 ug | ORAL_TABLET | Freq: Every day | ORAL | Status: DC
  Administered 2012-07-13: 2500 ug via ORAL
  Administered 2012-07-14: 10:00:00 via ORAL
  Administered 2012-07-15: 2500 ug via ORAL
  Filled 2012-07-13 (×3): qty 1

## 2012-07-13 MED ORDER — SODIUM CHLORIDE 0.9 % IV SOLN: 250.0000 mL | INTRAVENOUS | Status: DC | PRN

## 2012-07-13 MED ORDER — MOVING RIGHT ALONG BOOK
Freq: Once | Status: AC
  Administered 2012-07-13: 1
  Filled 2012-07-13: qty 1

## 2012-07-13 MED ORDER — BISACODYL 10 MG RE SUPP: 10.0000 mg | Freq: Every day | RECTAL | Status: DC | PRN

## 2012-07-13 MED ORDER — B-12 2500 MCG PO TABS: 2500.0000 ug | ORAL_TABLET | Freq: Every day | ORAL | Status: DC

## 2012-07-13 MED ORDER — VITAMIN D3 25 MCG (1000 UNIT) PO TABS
1000.0000 [IU] | ORAL_TABLET | Freq: Every day | ORAL | Status: DC
  Administered 2012-07-13 – 2012-07-15 (×3): 1000 [IU] via ORAL
  Filled 2012-07-13 (×3): qty 1

## 2012-07-13 MED ORDER — SODIUM CHLORIDE 0.9 % IJ SOLN: 3.0000 mL | INTRAMUSCULAR | Status: DC | PRN

## 2012-07-13 MED ORDER — ONDANSETRON HCL 4 MG PO TABS: 4.0000 mg | ORAL_TABLET | Freq: Four times a day (QID) | ORAL | Status: DC | PRN

## 2012-07-13 MED FILL — Mannitol IV Soln 20%: INTRAVENOUS | Qty: 500 | Status: AC

## 2012-07-13 MED FILL — Lidocaine HCl IV Inj 20 MG/ML: INTRAVENOUS | Qty: 5 | Status: AC

## 2012-07-13 MED FILL — Heparin Sodium (Porcine) Inj 1000 Unit/ML: INTRAMUSCULAR | Qty: 60 | Status: AC

## 2012-07-13 MED FILL — Heparin Sodium (Porcine) Inj 1000 Unit/ML: INTRAMUSCULAR | Qty: 30 | Status: AC

## 2012-07-13 MED FILL — Electrolyte-R (PH 7.4) Solution: INTRAVENOUS | Qty: 3000 | Status: AC

## 2012-07-13 MED FILL — Sodium Chloride IV Soln 0.9%: INTRAVENOUS | Qty: 2000 | Status: AC

## 2012-07-13 MED FILL — Sodium Bicarbonate IV Soln 8.4%: INTRAVENOUS | Qty: 250 | Status: AC

## 2012-07-13 MED FILL — Sodium Chloride Irrigation Soln 0.9%: Qty: 3000 | Status: AC

## 2012-07-13 NOTE — Progress Notes (Addendum)
2 Days Post-Op Procedure(s) (LRB): AORTIC VALVE REPLACEMENT (AVR) (N/A) CORONARY ARTERY BYPASS GRAFTING (CABG) (N/A) INTRAOPERATIVE TRANSESOPHAGEAL ECHOCARDIOGRAM (N/A) Subjective: sore  Objective: Vital signs in last 24 hours: Temp:  [97.9 F (36.6 C)-99.1 F (37.3 C)] 97.9 F (36.6 C) (01/15 0721) Pulse Rate:  [66-94] 70  (01/15 0715) Cardiac Rhythm:  [-] Normal sinus rhythm (01/14 2000) Resp:  [9-25] 15  (01/15 0715) BP: (85-111)/(42-59) 102/50 mmHg (01/15 0700) SpO2:  [88 %-98 %] 95 % (01/15 0715) Arterial Line BP: (86-130)/(36-62) 102/42 mmHg (01/15 0715) Weight:  [136.3 kg (300 lb 7.8 oz)] 136.3 kg (300 lb 7.8 oz) (01/15 0600)  Hemodynamic parameters for last 24 hours: PAP: (22-39)/(8-19) 39/8 mmHg  Intake/Output from previous day: 01/14 0701 - 01/15 0700 In: 1783.1 [P.O.:1080; I.V.:603.1; IV Piggyback:100] Out: 1640 [Urine:1570; Chest Tube:70] Intake/Output this shift:    General appearance: alert and cooperative Heart: regular rate and rhythm, S1, S2 normal, no murmur, click, rub or gallop Lungs: clear to auscultation bilaterally Extremities: edema mild Wound: dressing dry  Lab Results:  Basename 07/13/12 0409 07/12/12 1730 07/12/12 1720  WBC 14.8* -- 14.4*  HGB 9.9* 10.2* --  HCT 30.2* 30.0* --  PLT 120* -- 126*   BMET:  Basename 07/13/12 0409 07/12/12 1730 07/12/12 0300  NA 138 142 --  K 4.5 4.6 --  CL 106 109 --  CO2 23 -- 21  GLUCOSE 113* 126* --  BUN 44* 42* --  CREATININE 1.96* 1.90* --  CALCIUM 8.4 -- 8.3*    PT/INR:  Basename 07/11/12 1400  LABPROT 18.2*  INR 1.56*   ABG    Component Value Date/Time   PHART 7.326* 07/12/2012 0656   HCO3 23.8 07/12/2012 0656   TCO2 23 07/12/2012 1730   ACIDBASEDEF 2.0 07/12/2012 0656   O2SAT 97.0 07/12/2012 0656   CBG (last 3)   Basename 07/13/12 0720 07/13/12 0408 07/12/12 2322  GLUCAP 103* 121* 109*    Assessment/Plan: S/P Procedure(s) (LRB): AORTIC VALVE REPLACEMENT (AVR) (N/A) CORONARY  ARTERY BYPASS GRAFTING (CABG) (N/A) INTRAOPERATIVE TRANSESOPHAGEAL ECHOCARDIOGRAM (N/A) Mobilize Chronic renal insuff: creat was 1.45 in December, 1.8 preop. Continue to follow. He was on ACE I and diuretic preop. Plan for transfer to step-down: see transfer orders   LOS: 2 days    [name] K 07/13/2012

## 2012-07-14 LAB — BASIC METABOLIC PANEL
BUN: 47 mg/dL — ABNORMAL HIGH (ref 6–23)
Chloride: 105 mEq/L (ref 96–112)
Glucose, Bld: 90 mg/dL (ref 70–99)
Potassium: 4.7 mEq/L (ref 3.5–5.1)

## 2012-07-14 LAB — GLUCOSE, CAPILLARY
Glucose-Capillary: 94 mg/dL (ref 70–99)
Glucose-Capillary: 151 mg/dL — ABNORMAL HIGH (ref 70–99)
Glucose-Capillary: 102 mg/dL — ABNORMAL HIGH (ref 70–99)

## 2012-07-14 MED ORDER — LACTULOSE 10 GM/15ML PO SOLN
20.0000 g | Freq: Once | ORAL | Status: DC
  Filled 2012-07-14: qty 30

## 2012-07-14 MED ORDER — INSULIN DETEMIR 100 UNIT/ML ~~LOC~~ SOLN
15.0000 [IU] | Freq: Every day | SUBCUTANEOUS | Status: DC
  Administered 2012-07-14: 15 [IU] via SUBCUTANEOUS

## 2012-07-14 NOTE — Progress Notes (Addendum)
                    [address]            [place] [address]          [phone]     3 Days Post-Op Procedure(s) (LRB): AORTIC VALVE REPLACEMENT (AVR) (N/A) CORONARY ARTERY BYPASS GRAFTING (CABG) (N/A) INTRAOPERATIVE TRANSESOPHAGEAL ECHOCARDIOGRAM (N/A)  Subjective: Sore, but otherwise feels well.  No BM yet.   Objective: Vital signs in last 24 hours: Patient Vitals for the past 24 hrs:  BP Temp Temp src Pulse Resp SpO2 Weight  07/14/12 0443 116/59 mmHg 98.8 F (37.1 C) Oral 70  19  98 % 299 lb 11.2 oz (135.943 kg)  07/13/12 2105 114/60 mmHg 97.8 F (36.6 C) Oral 79  20  95 % -  07/13/12 1336 105/65 mmHg 97.6 F (36.4 C) Oral 79  22  95 % -  07/13/12 1159 110/67 mmHg 99.4 F (37.4 C) Oral 73  20  95 % -  07/13/12 1100 - - - 74  20  92 % -  07/13/12 1000 104/54 mmHg - - 81  21  97 % -  07/13/12 0907 - - - - - 94 % -  07/13/12 0900 97/49 mmHg - - 73  16  93 % -   Current Weight  07/14/12 299 lb 11.2 oz (135.943 kg)   PRE-OPERATIVE WEIGHT: 135 kg   Intake/Output from previous day: 01/15 0701 - 01/16 0700 In: 233 [P.O.:180; I.V.:3; IV Piggyback:50] Out: 1475 [Urine:1475]  CBGs 103-110-121-94    PHYSICAL EXAM:  Heart: RRR Lungs: Slightly diminished BS in bases, no wheezes Wound: Clean and dry Extremities: Trace LE edema    Lab Results: CBC: Basename 07/13/12 0409 07/12/12 1730 07/12/12 1720  WBC 14.8* -- 14.4*  HGB 9.9* 10.2* --  HCT 30.2* 30.0* --  PLT 120* -- 126*   BMET:  Basename 07/14/12 0500 07/13/12 0409  NA 140 138  K 4.7 4.5  CL 105 106  CO2 23 23  GLUCOSE 90 113*  BUN 47* 44*  CREATININE 1.82* 1.96*  CALCIUM 8.9 8.4    PT/INR:  Basename 07/11/12 1400  LABPROT 18.2*  INR 1.56*      Assessment/Plan: S/P Procedure(s) (LRB): AORTIC VALVE REPLACEMENT (AVR) (N/A) CORONARY ARTERY BYPASS GRAFTING (CABG) (N/A) INTRAOPERATIVE TRANSESOPHAGEAL ECHOCARDIOGRAM (N/A)  CV- stable, SR.  BPs low normal.  Continue low  dose Lopressor and watch.  Chronic renal insufficiency- Cr at baseline. Monitor.  CBGs stable, pre-op A1C=6.4.  Will start to wean insulin and watch. ?will need po med for home.  Pulm- wean O2, continue home inhalers, pulm toilet.  LOC today.  CRPI.   LOS: 3 days    COLLINS,GINA H 07/14/2012    Chart reviewed, patient examined, agree with above. He is doing well overall. Creat is at baseline. He was on ARB and diuretic preop. I would not restart these at this time. No BM yet. Will give lactulose. Possibly home tomorrow or sat.

## 2012-07-14 NOTE — Progress Notes (Signed)
CARDIAC REHAB PHASE I   PRE:  Rate/Rhythm: 71SR  BP:  Supine:   Sitting: 120/67  Standing:    SaO2: 97%2L  MODE:  Ambulation: 550 ft   POST:  Rate/Rhythem: 94  BP:  Supine:   Sitting: 130/67  Standing:    SaO2: 95%,98%RA hall, 92%RA room 1324-1355 Pt walked 550 ft on RA with rolling walker and asst x 1. Encouraged pursed-lip breathing during walk. Pt stopped twice to rest and I checked sats. 95 and 98%RA. Pt appeared SOB though. To chair after walk and put on oxygen for comfort. Tolerated well.  Jeani Sow

## 2012-07-15 LAB — BASIC METABOLIC PANEL
BUN: 40 mg/dL — ABNORMAL HIGH (ref 6–23)
CO2: 24 mEq/L (ref 19–32)
Chloride: 105 mEq/L (ref 96–112)
GFR calc Af Amer: 53 mL/min — ABNORMAL LOW (ref >90)
Glucose, Bld: 100 mg/dL — ABNORMAL HIGH (ref 70–99)
Potassium: 4.4 mEq/L (ref 3.5–5.1)

## 2012-07-15 LAB — GLUCOSE, CAPILLARY
Glucose-Capillary: 97 mg/dL (ref 70–99)
Glucose-Capillary: 87 mg/dL (ref 70–99)

## 2012-07-15 MED ORDER — METOPROLOL TARTRATE 12.5 MG HALF TABLET: 12.5000 mg | ORAL_TABLET | Freq: Two times a day (BID) | ORAL | Status: DC

## 2012-07-15 MED ORDER — OXYCODONE HCL 5 MG PO TABS: 5.0000 mg | ORAL_TABLET | ORAL | Status: DC | PRN

## 2012-07-15 NOTE — Progress Notes (Signed)
Pt given d/c instructions at this time; pt awaiting wife to arrive; IV and tele monitor d/c; will cont. To monitor.

## 2012-07-15 NOTE — Progress Notes (Signed)
Pt up ambulating independently in hallway at this time with rolling walker; no complaints at this time; will cont. To monitor.

## 2012-07-15 NOTE — Progress Notes (Signed)
                    [address]            [place] [address]          [phone]     4 Days Post-Op Procedure(s) (LRB): AORTIC VALVE REPLACEMENT (AVR) (N/A) CORONARY ARTERY BYPASS GRAFTING (CABG) (N/A) INTRAOPERATIVE TRANSESOPHAGEAL ECHOCARDIOGRAM (N/A)  Subjective: "I feel incredibly well." +BM. Breathing stable.  Objective: Vital signs in last 24 hours: Patient Vitals for the past 24 hrs:  BP Temp Temp src Pulse Resp SpO2 Weight  07/15/12 0429 126/72 mmHg 98.4 F (36.9 C) Oral 69  18  97 % 299 lb 9.7 oz (135.9 kg)  07/14/12 2207 130/67 mmHg 98.7 F (37.1 C) Oral 81  19  95 % -  07/14/12 1414 133/65 mmHg 99.1 F (37.3 C) Oral 73  18  97 % -  07/14/12 0824 - - - - - 98 % -   Current Weight  07/15/12 299 lb 9.7 oz (135.9 kg)     Intake/Output from previous day: 01/16 0701 - 01/17 0700 In: 2260 [P.O.:2260] Out: 2925 [Urine:2925]  CBGs [id number]    PHYSICAL EXAM:  Heart: RRR Lungs: Clear Wound: Clean and dry Extremities: Mild RLE edema    Lab Results: CBC: Basename 07/13/12 0409 07/12/12 1730 07/12/12 1720  WBC 14.8* -- 14.4*  HGB 9.9* 10.2* --  HCT 30.2* 30.0* --  PLT 120* -- 126*   BMET:  Basename 07/15/12 0415 07/14/12 0500  NA 140 140  K 4.4 4.7  CL 105 105  CO2 24 23  GLUCOSE 100* 90  BUN 40* 47*  CREATININE 1.51* 1.82*  CALCIUM 9.2 8.9    PT/INR: No results found for this basename: LABPROT,INR in the last 72 hours    Assessment/Plan: S/P Procedure(s) (LRB): AORTIC VALVE REPLACEMENT (AVR) (N/A) CORONARY ARTERY BYPASS GRAFTING (CABG) (N/A) INTRAOPERATIVE TRANSESOPHAGEAL ECHOCARDIOGRAM (N/A) CV- stable, SR. BPs improved. Continue low dose Lopressor.  WIll not resume ACE-I. Chronic renal insufficiency- Cr stable, baseline. CBGs stable, pre-op A1C=6.4.D/c insulin.  Will need to f/u with primary MD post-discharge. Pulm- COPD, stable, off O2. D/c EPWs, possible d/c later today vs in am.   LOS: 4 days     Sakara Lehtinen H 07/15/2012

## 2012-07-15 NOTE — Progress Notes (Signed)
Chest tube sutures and EPW d/c at this time per MD order; pt tolerated well; steri strips applied to CT sites; bedrest until 0930; VSS; will cont. To monitor.

## 2012-07-15 NOTE — Discharge Summary (Signed)
[address]            [place] [address]          [phone]         Discharge Summary  Name: [name] DOB: 1946-06-04 67 y.o. MRN: [id number]   Admission Date: 07/11/2012 Discharge Date: 07/15/2012   Admitting Diagnosis: Single vessel coronary artery disease Severe aortic stenosis   Discharge Diagnosis:  Single vessel coronary artery disease Severe aortic stenosis  Past Medical History  Diagnosis Date  . Hypertension   . Thyroid disease   . Arthritis   . COPD (chronic obstructive pulmonary disease)   . Heart murmur   . Aortic stenosis   . CAD (coronary artery disease)   . Syncope   . Depression   . Kidney stones   . Hypothyroidism   . Hemorrhoid       Procedures: AORTIC VALVE REPLACEMENT (25 mm [facility] Ease pericardial tissue valve) CORONARY ARTERY BYPASS GRAFTING (Saphenous vein graft to obtuse marginal) ENDOSCOPIC VEIN HARVEST RIGHT LEG on 07/11/2012     HPI:  The patient is a 67 y.o. male with a history of hypertension, non-obstructive coronary disease, previous heavy smoking with COPD, and morbid obesity who reports a 6-12 month history of progressively worsening dyspnea on exertion, exertional chest tightness, and dizziness with bending over. He had a 2-D echocardiogram done in November which showed moderate aortic stenosis with a valve area by continuity equation of 2.19 cm and a valve area of 1.27 cm by pressure half-time. There is mild aortic annular calcification and the aortic valve leaflets were moderately thickened and calcified. Compared to his previous study of 09/24/2008 there was modest progression of his aortic valve disease. Due to his progressive symptoms he underwent cardiac catheterization which showed normal right heart pressures. The mean aortic valve gradient was 30 mm mercury with a calculated aortic valve area of 1 cm with a valve index of 0.43. Cardiac index was 2 L per minute per meter  squared. Coronary angiography showed a 90% ostial dominant left circumflex stenosis. The LAD had mild narrowing of the ostium that looked to be about 40% at the most. The right coronary artery is a small nondominant vessel without disease. His left ventricular ejection fraction by echocardiogram was 65-70%. He was referred to Dr. Cyndia Bent for surgical consideration.  Dr. Cyndia Bent recommended AVR/CABG. All risks, benefits and alternatives of surgery were explained in detail, and the patient agreed to proceed.    Hospital Course:  The patient was admitted to [facility] on 07/11/2012. The patient was taken to the operating room and underwent the above procedure.    The postoperative course has been uneventful.  He has remained afebrile and vital signs are stable.  His renal function is at baseline, 1.5-1.8.  His blood sugars have been stable on sliding scale insulin.  He has maintained sinus rhythm postoperatively.  He is ambulating in the halls and tolerating a regular diet.      Recent vital signs:  Filed Vitals:   07/15/12 0931  BP: 120/58  Pulse: 78  Temp:   Resp:     Recent laboratory studies:  CBC: Basename 07/13/12 0409 07/12/12 1730 07/12/12 1720  WBC 14.8* -- 14.4*  HGB 9.9* 10.2* --  HCT 30.2* 30.0* --  PLT 120* -- 126*   BMET:  Basename 07/15/12 0415 07/14/12 0500  NA 140  140  K 4.4 4.7  CL 105 105  CO2 24 23  GLUCOSE 100* 90  BUN 40* 47*  CREATININE 1.51* 1.82*  CALCIUM 9.2 8.9    PT/INR: No results found for this basename: LABPROT,INR in the last 72 hours   Discharge Medications:     Medication List     As of 07/15/2012  2:09 PM    STOP taking these medications         furosemide 40 MG tablet   Commonly known as: LASIX      olmesartan 40 MG tablet   Commonly known as: BENICAR      potassium chloride SA 20 MEQ tablet   Commonly known as: K-DUR,KLOR-CON      TAKE these medications         albuterol 108 (90 BASE) MCG/ACT inhaler   Commonly known as:  PROVENTIL HFA;VENTOLIN HFA   Inhale 2 puffs into the lungs every 6 (six) hours as needed. For wheezing      ALPRAZolam 0.25 MG tablet   Commonly known as: XANAX   Take 1 tablet (0.25 mg total) by mouth every 6 (six) hours as needed for anxiety.      aspirin 325 MG tablet   Take 325 mg by mouth daily.      B-12 2500 MCG Tabs   Take 2,500 mcg by mouth daily.      cholecalciferol 1000 UNITS tablet   Commonly known as: VITAMIN D   Take 1,000 Units by mouth daily.      Fish Oil 1200 MG Caps   Take 2,400 mg by mouth daily.      metoprolol tartrate 12.5 mg Tabs   Commonly known as: LOPRESSOR   Take 0.5 tablets (12.5 mg total) by mouth 2 (two) times daily.      multivitamin tablet   Take 1 tablet by mouth daily.      oxyCODONE 5 MG immediate release tablet   Commonly known as: Oxy IR/ROXICODONE   Take 1-2 tablets (5-10 mg total) by mouth every 3 (three) hours as needed for pain.      sertraline 100 MG tablet   Commonly known as: ZOLOFT   Take 100 mg by mouth daily.      simvastatin 40 MG tablet   Commonly known as: ZOCOR   Take 40 mg by mouth every evening.      SPIRIVA HANDIHALER 18 MCG inhalation capsule   Generic drug: tiotropium   Place 18 mcg into inhaler and inhale daily.      SYNTHROID 125 MCG tablet   Generic drug: levothyroxine   Take 125 mcg by mouth daily.         Discharge Instructions:  The patient is to refrain from driving, heavy lifting or strenuous activity.  May shower daily and clean incisions with soap and water.  May resume regular diet.   Follow Up:      Discharge Orders    Future Appointments: Provider: Department: Dept Phone: Center:   08/02/2012 3:00 PM Gaye Pollack, MD [facility] [phone] [id number]     Future Orders Please Complete By Expires   Amb Referral to Cardiac Rehabilitation         Follow-up Information    Follow up with Gaye Pollack, MD. On 08/02/2012. (Have a chest x-ray at  2:00, then see MD at 2:00)    Contact information:   [name] [phone]  Follow up with Dorris Carnes, MD. Schedule an appointment as soon as possible for a visit in 2 weeks.   Contact information:   [address] [place] [address] [phone]           [name] 07/15/2012, 2:10 PM

## 2012-07-15 NOTE — Progress Notes (Signed)
[id number] Cardiac Rehab Completed discharge education with pt. He voices understanding. Pt agrees to [facility]. CRP in [place], will send referral.

## 2012-07-21 ENCOUNTER — Ambulatory Visit (HOSPITAL_COMMUNITY)
Admission: RE | Admit: 2012-07-21 | Discharge: 2012-07-21 | Disposition: A | Discharge status: Home / Self Care | Payer: Managed Care, Other (non HMO) | Source: Ambulatory Visit | Attending: Pulmonary Disease | Admitting: Pulmonary Disease

## 2012-07-21 ENCOUNTER — Other Ambulatory Visit (HOSPITAL_COMMUNITY): Payer: Self-pay | Admitting: Pulmonary Disease

## 2012-07-21 DIAGNOSIS — M79673 Pain in unspecified foot: Secondary | ICD-10-CM

## 2012-07-21 DIAGNOSIS — R52 Pain, unspecified: Secondary | ICD-10-CM

## 2012-07-21 DIAGNOSIS — R1030 Lower abdominal pain, unspecified: Secondary | ICD-10-CM

## 2012-07-21 DIAGNOSIS — M25579 Pain in unspecified ankle and joints of unspecified foot: Secondary | ICD-10-CM | POA: Insufficient documentation

## 2012-07-21 DIAGNOSIS — R609 Edema, unspecified: Secondary | ICD-10-CM

## 2012-07-21 DIAGNOSIS — M25476 Effusion, unspecified foot: Secondary | ICD-10-CM | POA: Insufficient documentation

## 2012-07-21 DIAGNOSIS — R198 Other specified symptoms and signs involving the digestive system and abdomen: Secondary | ICD-10-CM

## 2012-07-21 DIAGNOSIS — M25473 Effusion, unspecified ankle: Secondary | ICD-10-CM | POA: Insufficient documentation

## 2012-08-02 ENCOUNTER — Other Ambulatory Visit: Payer: Self-pay | Admitting: *Deleted

## 2012-08-02 ENCOUNTER — Encounter: Payer: Managed Care, Other (non HMO) | Admitting: Surgery

## 2012-08-02 DIAGNOSIS — I2510 Atherosclerotic heart disease of native coronary artery without angina pectoris: Secondary | ICD-10-CM

## 2012-08-02 DIAGNOSIS — I350 Nonrheumatic aortic (valve) stenosis: Secondary | ICD-10-CM

## 2012-08-05 ENCOUNTER — Ambulatory Visit
Admission: RE | Admit: 2012-08-05 | Discharge: 2012-08-05 | Disposition: A | Discharge status: Home / Self Care | Payer: Managed Care, Other (non HMO) | Source: Ambulatory Visit | Attending: Surgery | Admitting: Surgery

## 2012-08-05 ENCOUNTER — Encounter: Payer: Self-pay | Admitting: Surgery

## 2012-08-05 ENCOUNTER — Ambulatory Visit (INDEPENDENT_AMBULATORY_CARE_PROVIDER_SITE_OTHER): Payer: Self-pay | Admitting: Surgery

## 2012-08-05 VITALS — BP 116/71 | HR 92 | Resp 20 | Ht 70.0 in | Wt 299.0 lb

## 2012-08-05 DIAGNOSIS — Z951 Presence of aortocoronary bypass graft: Secondary | ICD-10-CM

## 2012-08-05 DIAGNOSIS — I359 Nonrheumatic aortic valve disorder, unspecified: Secondary | ICD-10-CM

## 2012-08-05 DIAGNOSIS — Z952 Presence of prosthetic heart valve: Secondary | ICD-10-CM

## 2012-08-05 DIAGNOSIS — I2510 Atherosclerotic heart disease of native coronary artery without angina pectoris: Secondary | ICD-10-CM

## 2012-08-05 DIAGNOSIS — I350 Nonrheumatic aortic (valve) stenosis: Secondary | ICD-10-CM

## 2012-08-05 DIAGNOSIS — Z954 Presence of other heart-valve replacement: Secondary | ICD-10-CM

## 2012-08-05 NOTE — Progress Notes (Signed)
[address]            [place] [address]          [phone]      HPI:  Patient returns for routine postoperative follow-up having undergone coronary bypass graft surgery x1 and aortic valve placement using a 25 mm Edwards pericardial valve on 07/11/2012. The patient's early postoperative recovery while in the hospital was notable for an uncomplicated postoperative course. Since hospital discharge the patient reports that he developed acute gout in his left ankle shortly after going home. He was treated with prednisone and colchicine with complete resolution. He has been watching his diet closely and stopped drinking soft drinks completely. He has been walking daily without chest pain or shortness of breath. He has lost about 20 pounds since going home. He said that his breathing is much better than it has been years.   Current Outpatient Prescriptions  Medication Sig Dispense Refill  . albuterol (PROVENTIL HFA;VENTOLIN HFA) 108 (90 BASE) MCG/ACT inhaler Inhale 2 puffs into the lungs every 6 (six) hours as needed. For wheezing      . ALPRAZolam (XANAX) 0.25 MG tablet Take 1 tablet (0.25 mg total) by mouth every 6 (six) hours as needed for anxiety.  30 tablet  0  . aspirin 325 MG tablet Take 325 mg by mouth daily.      . cholecalciferol (VITAMIN D) 1000 UNITS tablet Take 1,000 Units by mouth daily.       Marland Kitchen COLCRYS 0.6 MG tablet Take 0.6 mg by mouth 2 (two) times daily.       . Cyanocobalamin (B-12) 2500 MCG TABS Take 2,500 mcg by mouth daily.      . metoprolol tartrate (LOPRESSOR) 12.5 mg TABS Take 0.5 tablets (12.5 mg total) by mouth 2 (two) times daily.  30 tablet  0  . Multiple Vitamin (MULTIVITAMIN) tablet Take 1 tablet by mouth daily.      . Omega-3 Fatty Acids (FISH OIL) 1200 MG CAPS Take 2,400 mg by mouth daily.      Marland Kitchen oxyCODONE (OXY IR/ROXICODONE) 5 MG immediate release tablet Take 1-2 tablets (5-10 mg total) by mouth every 3 (three) hours as  needed for pain.  30 tablet  0  . sertraline (ZOLOFT) 100 MG tablet Take 100 mg by mouth daily.      . simvastatin (ZOCOR) 40 MG tablet Take 40 mg by mouth every evening.      Marland Kitchen SPIRIVA HANDIHALER 18 MCG inhalation capsule Place 18 mcg into inhaler and inhale daily.       Marland Kitchen SYNTHROID 125 MCG tablet Take 125 mcg by mouth daily.         Physical Exam:  BP 116/71  Pulse 92  Resp 20  Ht 5\' 10"  (1.778 m)  Wt 299 lb (135.626 kg)  BMI 42.90 kg/m2  SpO2 97% He looks well. Cardiac exam shows a regular rate and rhythm with normal valve sounds. Lung exam is clear. The chest incision is healing well and sternum is stable.  His leg incision is healing well and there is no peripheral edema.  Diagnostic Tests:  *RADIOLOGY REPORT*   Clinical Data: Status post thoracic surgery.   CHEST - 2 VIEW   Comparison: July 13, 2012.   Findings: Sternotomy wires are noted. Stable cardiomegaly.  No acute pulmonary disease is noted.  Bony thorax is normal.   IMPRESSION:  No acute cardiopulmonary abnormality seen.     Original Report Authenticated By: Marijo Conception.,  M.D.     Impression:  Overall he is making a very good recovery following his surgery. He seems very motivated to modify his cardiac risk factors. I told him he could return to driving a car but should not lift anything heavier than 10 pounds for 3 months postoperatively.  Plan: He'll followup with Dr. Luan Pulling and Dr. Harrington Challenger and will contact me if he develops any problems with his incisions.

## 2012-08-19 ENCOUNTER — Ambulatory Visit (INDEPENDENT_AMBULATORY_CARE_PROVIDER_SITE_OTHER): Payer: Managed Care, Other (non HMO) | Admitting: Internal Medicine

## 2012-08-19 ENCOUNTER — Ambulatory Visit: Payer: Managed Care, Other (non HMO) | Admitting: Internal Medicine

## 2012-08-19 ENCOUNTER — Encounter: Payer: Self-pay | Admitting: Internal Medicine

## 2012-08-19 VITALS — BP 115/73 | HR 83 | Ht 70.5 in | Wt 282.0 lb

## 2012-08-19 DIAGNOSIS — Z952 Presence of prosthetic heart valve: Secondary | ICD-10-CM

## 2012-08-19 DIAGNOSIS — I10 Essential (primary) hypertension: Secondary | ICD-10-CM

## 2012-08-19 DIAGNOSIS — Z954 Presence of other heart-valve replacement: Secondary | ICD-10-CM

## 2012-08-19 DIAGNOSIS — E782 Mixed hyperlipidemia: Secondary | ICD-10-CM

## 2012-08-19 NOTE — Patient Instructions (Addendum)
Your physician recommends that you schedule a follow-up appointment in: Late May or early June  Your physician has requested that you have an echocardiogram. Echocardiography is a painless test that uses sound waves to create images of your heart. It provides your doctor with information about the size and shape of your heart and how well your heart's chambers and valves are working. This procedure takes approximately one hour. There are no restrictions for this procedure.  Your physician recommends that you return for lab work in: The day of your Echo

## 2012-08-19 NOTE — Progress Notes (Signed)
HPI Patient is  67 yo who I saw back in December.  He was sent for evaluation of SOB on exertion and chest tightnes He had cardiac cath that showed 40% LAD and 90% prox OM (dominant) as well as  aortic stenosis.  He went on to have CABG x 1 (SVG to OM) and AVR (Edwards pericardial valve) with  By Ellison Hughs SInce d/c he has been breathing better. ONly chest wall pain  Legs feel weak  Has not exercised much Immed post op had flare of gout.  On Colcrys. No Known Allergies  Current Outpatient Prescriptions  Medication Sig Dispense Refill  . albuterol (PROVENTIL HFA;VENTOLIN HFA) 108 (90 BASE) MCG/ACT inhaler Inhale 2 puffs into the lungs every 6 (six) hours as needed. For wheezing      . ALPRAZolam (XANAX) 0.25 MG tablet Take 1 tablet (0.25 mg total) by mouth every 6 (six) hours as needed for anxiety.  30 tablet  0  . aspirin 325 MG tablet Take 325 mg by mouth daily.      . cholecalciferol (VITAMIN D) 1000 UNITS tablet Take 1,000 Units by mouth daily.       Marland Kitchen COLCRYS 0.6 MG tablet Take 0.6 mg by mouth 2 (two) times daily.       . Cyanocobalamin (B-12) 2500 MCG TABS Take 2,500 mcg by mouth daily.      . metoprolol tartrate (LOPRESSOR) 12.5 mg TABS Take 0.5 tablets (12.5 mg total) by mouth 2 (two) times daily.  30 tablet  0  . Multiple Vitamin (MULTIVITAMIN) tablet Take 1 tablet by mouth daily.      . Omega-3 Fatty Acids (FISH OIL) 1200 MG CAPS Take 2,400 mg by mouth daily.      Marland Kitchen oxyCODONE (OXY IR/ROXICODONE) 5 MG immediate release tablet Take 1-2 tablets (5-10 mg total) by mouth every 3 (three) hours as needed for pain.  30 tablet  0  . sertraline (ZOLOFT) 100 MG tablet Take 100 mg by mouth daily.      . simvastatin (ZOCOR) 40 MG tablet Take 40 mg by mouth every evening.      Marland Kitchen SPIRIVA HANDIHALER 18 MCG inhalation capsule Place 18 mcg into inhaler and inhale daily.       Marland Kitchen SYNTHROID 125 MCG tablet Take 125 mcg by mouth daily.        No current facility-administered medications for this visit.     Past Medical History  Diagnosis Date  . Hypertension   . Thyroid disease   . Arthritis   . COPD (chronic obstructive pulmonary disease)   . Heart murmur   . Aortic stenosis   . CAD (coronary artery disease)   . Syncope   . Depression   . Kidney stones   . Hypothyroidism   . Hemorrhoid     Past Surgical History  Procedure Laterality Date  . Joint replacement    . Angiopasty    . Carotid endarterectomy    . Cardiac catheterization    . Aortic valve replacement  07/11/2012    Procedure: AORTIC VALVE REPLACEMENT (AVR);  Surgeon: Gaye Pollack, MD;  Location: [facility];  Service: Open Heart Surgery;  Laterality: N/A;  . Coronary artery bypass graft  07/11/2012    Procedure: CORONARY ARTERY BYPASS GRAFTING (CABG);  Surgeon: Gaye Pollack, MD;  Location: [facility];  Service: Open Heart Surgery;  Laterality: N/A;  CABG x one,  using right leg greater saphenous vein harvested endoscopically  . Intraoperative transesophageal echocardiogram  07/11/2012    Procedure: INTRAOPERATIVE TRANSESOPHAGEAL ECHOCARDIOGRAM;  Surgeon: Gaye Pollack, MD;  Location[facility] OR;  Service: Open Heart Surgery;  Laterality: N/A;    Family History  Problem Relation Age of Onset  . Heart disease Mother   . Heart disease Father   . Hypertension Father     History   Social History  . Marital Status: Married    Spouse Name: N/A    Number of Children: N/A  . Years of Education: N/A   Occupational History  . Not on file.   Social History Main Topics  . Smoking status: Former Smoker -- 3.00 packs/day for 50 years    Types: Cigarettes    Quit date: 12/13/2010  . Smokeless tobacco: Not on file     Comment: Electronic cigarette...USES INFREQUENTLY NOW  . Alcohol Use: Yes     Comment: Rare  . Drug Use: No  . Sexually Active: Not on file   Other Topics Concern  . Not on file   Social History Narrative  . No narrative on file    Review of Systems:  All systems reviewed.  They are negative to the above  problem except as previously stated.  Vital Signs: BP 115/73  Pulse 83  Ht 5' 10.5" (1.791 m)  Wt 282 lb (127.914 kg)  BMI 39.88 kg/m2  SpO2 96%  Physical Exam [place] is in NAD HEENT:  Normocephalic, atraumatic. EOMI, PERRLA.  Neck: JVP is normal.  No bruits.  Lungs: clear to auscultation. No rales no wheezes.  Heart: Regular rate and rhythm. Normal S1, S2. No S3.   No significant murmurs. PMI not displaced.  Abdomen:  Supple, nontender. Normal bowel sounds. No masses. No hepatomegaly.  Extremities:   Good distal pulses throughout. No lower extremity edema.  Musculoskeletal :moving all extremities.  Neuro:   alert and oriented x3.  CN II-XII grossly intact.  EKG  SR 90  Nonspecific ST T wave changes. Assessment and Plan:  1.  CABG  Doing well post op  Will have him enrolled in cardiac rehab  2.  AS  S/p AVR  No murmur  WIll get baseline echo of valve function  3.  HL  Will get fasting lipids  4.  Obesity  Wt down 20 lb  Congratulated him and encouraged him to continue.  F/u at end of may.

## 2012-08-23 ENCOUNTER — Ambulatory Visit (HOSPITAL_COMMUNITY)
Admission: RE | Admit: 2012-08-23 | Discharge: 2012-08-23 | Disposition: A | Discharge status: Home / Self Care | Payer: Managed Care, Other (non HMO) | Source: Ambulatory Visit | Attending: Internal Medicine | Admitting: Internal Medicine

## 2012-08-23 DIAGNOSIS — I2510 Atherosclerotic heart disease of native coronary artery without angina pectoris: Secondary | ICD-10-CM | POA: Insufficient documentation

## 2012-08-23 DIAGNOSIS — Z954 Presence of other heart-valve replacement: Secondary | ICD-10-CM | POA: Insufficient documentation

## 2012-08-23 DIAGNOSIS — R55 Syncope and collapse: Secondary | ICD-10-CM | POA: Insufficient documentation

## 2012-08-23 DIAGNOSIS — I10 Essential (primary) hypertension: Secondary | ICD-10-CM | POA: Insufficient documentation

## 2012-08-23 DIAGNOSIS — I517 Cardiomegaly: Secondary | ICD-10-CM

## 2012-08-23 DIAGNOSIS — J4489 Other specified chronic obstructive pulmonary disease: Secondary | ICD-10-CM | POA: Insufficient documentation

## 2012-08-23 NOTE — Progress Notes (Signed)
*  PRELIMINARY RESULTS* Echocardiogram 2D Echocardiogram has been performed.  Tera Partridge 08/23/2012, 8:32 AM

## 2012-08-24 ENCOUNTER — Encounter: Payer: Self-pay | Admitting: *Deleted

## 2012-08-24 LAB — LIPID PANEL: Cholesterol: 167 mg/dL (ref 0–200)

## 2012-08-30 ENCOUNTER — Encounter (HOSPITAL_COMMUNITY): Payer: Managed Care, Other (non HMO)

## 2012-09-13 ENCOUNTER — Encounter (HOSPITAL_COMMUNITY): Payer: Managed Care, Other (non HMO)

## 2012-09-27 ENCOUNTER — Ambulatory Visit (HOSPITAL_COMMUNITY): Payer: Managed Care, Other (non HMO)

## 2012-10-26 ENCOUNTER — Encounter (HOSPITAL_COMMUNITY): Payer: Self-pay | Admitting: *Deleted

## 2012-11-14 ENCOUNTER — Ambulatory Visit: Payer: Managed Care, Other (non HMO) | Admitting: Internal Medicine

## 2013-03-21 ENCOUNTER — Encounter: Payer: Self-pay | Admitting: *Deleted

## 2016-07-28 ENCOUNTER — Other Ambulatory Visit (HOSPITAL_COMMUNITY): Payer: Self-pay | Admitting: Respiratory Therapy

## 2016-07-28 DIAGNOSIS — J441 Chronic obstructive pulmonary disease with (acute) exacerbation: Secondary | ICD-10-CM

## 2016-08-05 ENCOUNTER — Ambulatory Visit (HOSPITAL_COMMUNITY)
Admission: RE | Admit: 2016-08-05 | Discharge: 2016-08-05 | Disposition: A | Discharge status: Home / Self Care | Payer: Managed Care, Other (non HMO) | Source: Ambulatory Visit | Attending: Pulmonary Disease | Admitting: Pulmonary Disease

## 2016-08-05 DIAGNOSIS — J989 Respiratory disorder, unspecified: Secondary | ICD-10-CM | POA: Insufficient documentation

## 2016-08-05 DIAGNOSIS — J441 Chronic obstructive pulmonary disease with (acute) exacerbation: Secondary | ICD-10-CM | POA: Diagnosis not present

## 2016-08-05 MED ORDER — ALBUTEROL SULFATE (2.5 MG/3ML) 0.083% IN NEBU
2.5000 mg | INHALATION_SOLUTION | Freq: Once | RESPIRATORY_TRACT | Status: AC
  Administered 2016-08-05: 2.5 mg via RESPIRATORY_TRACT

## 2016-08-31 ENCOUNTER — Ambulatory Visit (INDEPENDENT_AMBULATORY_CARE_PROVIDER_SITE_OTHER): Payer: Managed Care, Other (non HMO) | Admitting: Neurology

## 2016-08-31 ENCOUNTER — Encounter: Payer: Self-pay | Admitting: Neurology

## 2016-08-31 VITALS — BP 162/76 | HR 70 | Resp 20 | Ht 70.0 in | Wt 295.0 lb

## 2016-08-31 DIAGNOSIS — R51 Headache: Secondary | ICD-10-CM

## 2016-08-31 DIAGNOSIS — G250 Essential tremor: Secondary | ICD-10-CM

## 2016-08-31 DIAGNOSIS — R400 Somnolence: Secondary | ICD-10-CM | POA: Diagnosis not present

## 2016-08-31 DIAGNOSIS — R351 Nocturia: Secondary | ICD-10-CM | POA: Diagnosis not present

## 2016-08-31 DIAGNOSIS — R519 Headache, unspecified: Secondary | ICD-10-CM

## 2016-08-31 DIAGNOSIS — G4733 Obstructive sleep apnea (adult) (pediatric): Secondary | ICD-10-CM

## 2016-08-31 NOTE — Progress Notes (Addendum)
Subjective:    Patient ID: [name] is a 71 y.o. male.  HPI     Star Age, MD, PhD [facility] [address] [place], [place] [address]  Dear Dr. Luan Pulling,   I saw your patient, [name], upon your kind request in my neurologic clinic today for initial consultation of his tremor, concerning for parkinsonism. The patient is accompanied by his wife today. As you know, [name] is a 71 year old right-handed gentleman with an underlying medical history of COPD, hypertension, coronary artery disease, status post single-vessel CABG and aortic valve replacement for aortic stenosis, thyroid disease, depression, kidney stones, carotid artery disease with status post carotid endarterectomy, hyperlipidemia, smoking, and morbid obesity, who reports a bilateral upper extremity tremor for at least 6 months. The tremor affects both upper extremities and has been progressive. I reviewed your office note from 07/27/2016, which you kindly included. He had blood work on 07/27/2016. TSH was low at 0.04. Since then, his Synthroid was reduced from 2 pills to 1 pill. His wife reports that she noticed within a couple of weeks an improvement in his tremor. Of note, he has a family history of upper extremity tremors in his mother and one maternal uncle, no FHx of PD. He feels improved in his tremor,  but it is not gone.  Of note, he has a long-standing history of anxiety and depression, had a severe bout of depression when he lost his mom and his first wife and 2012. He has been on Zoloft. He is retired. He lives with his second wife. He has 1 biological child. He quit smoking 3-1/2 years ago and drinks alcohol occasionally. He does drink quite a bit of caffeine in the form of coffee which is about 3 cups per day and some drop 2 bottles per day, 16.9 ounces each. Of note, he snores and has witnessed apneic pauses while asleep per wife. He does not sleep well and often  just sleeps in his recliner. He is unable to sleep on his back and does nod like to sleep in his own bed. He has significant daytime somnolence and naps throughout the day. He does not sleep well at night. He gets up to use the bathroom at night.  His Past Medical History Is Significant For: Past Medical History:  Diagnosis Date  . Aortic stenosis   . Arthritis   . CAD (coronary artery disease)   . COPD (chronic obstructive pulmonary disease) ([place])   . Depression   . Heart murmur   . Hemorrhoid   . Hypertension   . Hypothyroidism   . Kidney stones   . Syncope   . Thyroid disease     His Past Surgical History Is Significant For: Past Surgical History:  Procedure Laterality Date  . ANGIOPASTY    . AORTIC VALVE REPLACEMENT  07/11/2012   Procedure: AORTIC VALVE REPLACEMENT (AVR);  Surgeon: Gaye Pollack, MD;  Location: [facility];  Service: Open Heart Surgery;  Laterality: N/A;  . CARDIAC CATHETERIZATION    . CAROTID ENDARTERECTOMY    . CORONARY ARTERY BYPASS GRAFT  07/11/2012   Procedure: CORONARY ARTERY BYPASS GRAFTING (CABG);  Surgeon: Gaye Pollack, MD;  Location: [facility];  Service: Open Heart Surgery;  Laterality: N/A;  CABG x one,  using right leg greater saphenous vein harvested endoscopically  . INTRAOPERATIVE TRANSESOPHAGEAL ECHOCARDIOGRAM  07/11/2012   Procedure: INTRAOPERATIVE TRANSESOPHAGEAL ECHOCARDIOGRAM;  Surgeon: Gaye Pollack, MD;  Location: [facility];  Service: Open Heart Surgery;  Laterality: N/A;  . JOINT REPLACEMENT      His Family History Is Significant For: Family History  Problem Relation Age of Onset  . Heart disease Mother   . Heart disease Father   . Hypertension Father     His Social History Is Significant For: Social History   Social History  . Marital status: Married    Spouse name: N/A  . Number of children: 1  . Years of education: HS   Occupational History  . Retired     Social History Main Topics  . Smoking status: Former Smoker    Packs/day:  3.00    Years: 50.00    Types: Cigarettes    Quit date: 12/13/2010  . Smokeless tobacco: Former Systems developer     Comment: Electronic cigarette...USES INFREQUENTLY NOW  . Alcohol use Yes     Comment: Rare  . Drug use: No  . Sexual activity: Not Asked   Other Topics Concern  . None   Social History Narrative   Drinks about 3 cups of coffee a day, drinks about 2 sundrops a day     His Allergies Are:  No Known Allergies:   His Current Medications Are:  Outpatient Encounter Prescriptions as of 08/31/2016  Medication Sig  . albuterol (PROVENTIL HFA;VENTOLIN HFA) 108 (90 BASE) MCG/ACT inhaler Inhale 2 puffs into the lungs every 6 (six) hours as needed. For wheezing  . allopurinol (ZYLOPRIM) 300 MG tablet Take 300 mg by mouth daily.  Marland Kitchen aspirin 325 MG tablet Take 325 mg by mouth daily.  . Glucosamine-Chondroit-Vit C-Mn (GLUCOSAMINE CHONDR 1500 COMPLX PO) Take by mouth.  . L-LYSINE PO Take by mouth.  . metoprolol tartrate (LOPRESSOR) 12.5 mg TABS Take 0.5 tablets (12.5 mg total) by mouth 2 (two) times daily.  . Multiple Vitamin (MULTIVITAMIN) tablet Take 1 tablet by mouth daily.  . NON FORMULARY Tumeric 500mg   . Omega-3 Fatty Acids (FISH OIL) 1200 MG CAPS Take 2,400 mg by mouth daily.  Marland Kitchen oxyCODONE (OXY IR/ROXICODONE) 5 MG immediate release tablet Take 1-2 tablets (5-10 mg total) by mouth every 3 (three) hours as needed for pain.  Marland Kitchen sertraline (ZOLOFT) 100 MG tablet Take 100 mg by mouth daily.  . simvastatin (ZOCOR) 40 MG tablet Take 40 mg by mouth every evening.  Marland Kitchen SPIRIVA HANDIHALER 18 MCG inhalation capsule Place 18 mcg into inhaler and inhale daily.   Marland Kitchen SYNTHROID 125 MCG tablet Take 125 mcg by mouth daily.   . [DISCONTINUED] ALPRAZolam (XANAX) 0.25 MG tablet Take 1 tablet (0.25 mg total) by mouth every 6 (six) hours as needed for anxiety.  . [DISCONTINUED] cholecalciferol (VITAMIN D) 1000 UNITS tablet Take 1,000 Units by mouth daily.   . [DISCONTINUED] COLCRYS 0.6 MG tablet Take 0.6 mg by mouth  2 (two) times daily.   . [DISCONTINUED] Cyanocobalamin (B-12) 2500 MCG TABS Take 2,500 mcg by mouth daily.   No facility-administered encounter medications on file as of 08/31/2016.   :  Review of Systems:  Out of a complete 14 point review of systems, all are reviewed and negative with the exception of these symptoms as listed below:  Review of Systems  Neurological:       Patient has had on and off shaking in his hands for the last 6 months. Has had some changes in thyroid medications.  Patient has RLS.     Objective:  Neurologic Exam  Physical Exam Physical Examination:   Vitals:   08/31/16 1455  BP: (!) 162/76  Pulse: 70  Resp: 20   General Examination: The patient is a very pleasant 71 y.o. male in no acute distress. He appears well-developed and well-nourished and well groomed. He is mildly anxious appearing.  HEENT: Normocephalic, atraumatic, pupils are equal, round and reactive to light and accommodation. Funduscopic exam is normal with sharp disc margins noted. Extraocular tracking is good without limitation to gaze excursion or nystagmus noted. Normal smooth pursuit is noted. Hearing is grossly intact. Face is symmetric with normal facial animation and normal facial sensation. Speech is clear with no dysarthria noted. There is no hypophonia. There is no lip, neck/head, jaw or voice tremor. Neck is supple with full range of passive and active motion. There is a loud Left carotid bruit, no right carotid bruit is noted. He is status post left carotid endarterectomy. Oropharynx exam reveals: mild mouth dryness, adequate dental hygiene and moderate airway crowding. Mallampati is class II. Tongue protrudes centrally and palate elevates symmetrically. Tonsils are 1+ to 2+ in size. Neck circumference is 21-1/4 inches.   Chest: Clear to auscultation without wheezing, rhonchi or crackles noted.  Heart: S1+S2+0, regular and systolic murmur noted, no rubs or gallops noted.   Abdomen:  Soft, non-tender and non-distended with normal bowel sounds appreciated on auscultation.  Extremities: There is no pitting edema in the distal lower extremities bilaterally. Pedal pulses are intact.  Skin: Warm and dry without trophic changes noted.  Musculoskeletal: exam reveals no obvious joint deformities, tenderness or joint swelling or erythema.   Neurologically:  Mental status: The patient is awake, alert and oriented in all 4 spheres. His immediate and remote memory, attention, language skills and fund of knowledge are appropriate. There is no evidence of aphasia, agnosia, apraxia or anomia. Speech is clear with normal prosody and enunciation. Thought process is linear. Mood is normal and affect is normal.  Cranial nerves II - XII are as described above under HEENT exam. In addition: shoulder shrug is normal with equal shoulder height noted. Motor exam: Normal bulk, strength and tone is noted. There is no drift, resting tremor or rebound. Romberg is negative. Reflexes are 1-2+ throughout.  On Archimedes spiral drawing, he has mild tremulousness on the right and mild to moderate on the left. Handwriting is very mildly tremulous but legible, not micrographic. He has a mild bilateral upper extremity postural and action tremor, no intention tremor, no resting tremor. Fine motor skills and coordination: intact with normal finger taps, normal hand movements, normal rapid alternating patting, normal foot taps and normal foot agility.  Cerebellar testing: No dysmetria or intention tremor on finger to nose testing. Heel to shin is unremarkable bilaterally. There is no truncal or gait ataxia.  Sensory exam: intact to light touch, pinprick, vibration, temperature sense in the upper and lower extremities.  Gait, station and balance: He stands easily. No veering to one side is noted. No leaning to one side is noted. Posture is age-appropriate and stance is narrow based. Gait shows normal stride length and  normal pace. He has preserved arm swing bilaterally. No problems turning are noted. Tandem walk is unremarkable.       Assessment and Plan:    In summary, ROSHAUN POUND is a very pleasant 71 y.o.-year old male with an underlying medical history of COPD, hypertension, coronary artery disease, status post single-vessel CABG and aortic valve replacement for aortic stenosis, thyroid disease, depression, kidney stones, carotid artery disease with status post carotid endarterectomy, hyperlipidemia, smoking, and morbid obesity,  who was history and physical exam and family history are in keeping with mild essential tremor, most likely exacerbated by thyroid dysfunction and his anxiety disorder as well as his excessive caffeine intake. Thankfully, after his Synthroid dose was adjusted he has noted an improvement in his tremor. We mutually agreed to monitor his symptoms, we may consider low-dose Mysoline for symptomatic treatment of his tremor. Of note, he does not sleep very well and tremor can be exacerbated by sleep deprivation, dehydration, anxiety, and depression medication. His history and physical exam are in keeping with obstructive sleep apnea and given his medical history and particularly his cardiac history I strongly suggested that we pursue sleep study testing. He's reluctant to come in for an overnight sleep study. I suggested we proceed with a home sleep test at least. He's willing to consider this. To that end I ordered a home sleep test and we will call him with the test results, he is advised to consider CPAP therapy for obstructive sleep apnea. I did not suggest any additional diagnostic tests or tremor. I reassured the patient and his wife that he does not have any telltale signs or symptoms of parkinsonism. We will monitor his symptoms and exam. I would like to see him back in about 3 months, sooner as needed. I answered all their questions today and the patient and his wife were in agreement.  Thank  you very much for allowing me to participate in the care of this nice patient. If I can be of any further assistance to you please do not hesitate to call me at [phone].  Sincerely,   Star Age, MD, PhD

## 2016-08-31 NOTE — Patient Instructions (Addendum)
Based on your symptoms and your exam I believe you are at risk for obstructive sleep apnea or OSA, and I think we should proceed with a sleep study to determine whether you do or do not have OSA and how severe it is. If you have more than mild OSA, I want you to consider treatment with CPAP. Please remember, the risks and ramifications of moderate to severe obstructive sleep apnea or OSA are: Cardiovascular disease, including congestive heart failure, stroke, difficult to control hypertension, arrhythmias, and even type 2 diabetes has been linked to untreated OSA. Sleep apnea causes disruption of sleep and sleep deprivation in most cases, which, in turn, can cause recurrent headaches, problems with memory, mood, concentration, focus, and vigilance. Most people with untreated sleep apnea report excessive daytime sleepiness, which can affect their ability to drive. Please do not drive if you feel sleepy.   We will do a home sleep test.   Please remember, that any kind of tremor may be exacerbated by anxiety, anger, nervousness, excitement, dehydration, sleep deprivation, by caffeine, and low blood sugar values or blood sugar fluctuations. Some medications, especially some antidepressants and lithium can cause or exacerbate tremors. Tremors may temporarily calm down or subside with the use of a benzodiazepine such as Valium or related medications and with alcohol. Be aware, however, that drinking alcohol is not an approved or appropriate treatment for tremor control and long-term use of benzodiazepines such as Valium, lorazepam, alprazolam, or clonazepam can cause habit formation, physical and psychological addiction. There are very few medications that symptomatically help with tremor reduction, none are without potential side effects.   Please gradually increase your water intake and decrease your caffeine intake.  You do not have any telltale signs of parkinsonism, but you do have a mild tremor, likely  essential tremor, exacerbated by your thyroid condition.   We may consider a medication, called Mysoline (primidone) 50 mg strength in the future.

## 2016-10-05 ENCOUNTER — Ambulatory Visit (INDEPENDENT_AMBULATORY_CARE_PROVIDER_SITE_OTHER): Payer: Managed Care, Other (non HMO) | Admitting: Neurology

## 2016-10-05 DIAGNOSIS — R400 Somnolence: Secondary | ICD-10-CM

## 2016-10-05 DIAGNOSIS — R0683 Snoring: Secondary | ICD-10-CM

## 2016-10-05 DIAGNOSIS — G4733 Obstructive sleep apnea (adult) (pediatric): Secondary | ICD-10-CM

## 2016-10-06 NOTE — Addendum Note (Signed)
Addended by: Star Age on: 10/06/2016 07:01 PM   Modules accepted: Orders

## 2016-10-06 NOTE — Procedures (Signed)
[facility] [address], [place], [place] [address]  NAME: [name] DOB: Nov 08, 1945 MEDICAL RECORD [id number]  DOS: 10/05/16 REFERRING PHYSICIAN: Kandy Garrison  Study Performed:  HST/Out of Center Sleep Test  HISTORY:  71 year old man with a history of COPD, hypertension, coronary artery disease, status post single-vessel CABG and aortic valve replacement for aortic stenosis, thyroid disease, depression, kidney stones, carotid artery disease with status post carotid endarterectomy, hyperlipidemia, smoking, tremor, and morbid obesity, who snores and has witnessed apneic pauses while asleep per wife. He often sleeps in his recliner.  STUDY RESULTS:  Total Recording Time: 7h 56m, duration: 3 h, 56 min  Total Apnea/Hypopnea Index (AHI): 3.8/h  Average Oxygen Saturation:91%  Lowest Oxygen Saturation: 85%   Average Mean Heart Rate:56 bpm   IMPRESSION: Snoring, daytime somnolence, clinical concern for OSA  RECOMMENDATION: This home sleep test shows no clear evidence of obstructive sleep apnea (OSA), but may be an underestimation of his sleep disordered breathing. There is clinical concern for OSA (large neck circumference, morbid obesity, witnessed apneas, per spouse), and given his medical history of COPD and heard disease (s/p AVR, CABG), I would recommend an attended sleep study. The patient should be cautioned not to drive, work at heights, or operate dangerous or heavy equipment when tired or sleepy. Review and reiteration of good sleep hygiene measures should be pursued with any patient. The results will be discussed with the patient. The referring provider will be notified of the test results. The patient will be seen in follow up in sleep clinic at [facility] as necessary.   I certify that I have reviewed the raw data recording prior to the issuance of this report in accordance with the standards of Accreditation of the American Academy of  Sleep medicine (AASM).   Star Age, MD, PhD [facility]) Diplomat, ABPN (neurology and sleep)

## 2016-10-06 NOTE — Progress Notes (Signed)
Patient referred by Dr. Luan Pulling for tremor, seen by me on 08/31/16, HST on 10/05/16.   Please call and notify the patient that the recent home sleep test did not show any significant obstructive sleep apnea, but may be an underestimation and as there is clinical concern for OSA (large neck circumference, morbid obesity, witnessed apneas, per spouse), and given his medical history of COPD and heart disease (s/p AVR, CABG), I would recommend an attended sleep study. I will order a sleep study to help with Dx.  Shirlean Mylar, please assist with auth process if needed, as we did a HST first.  Once you have spoken to patient, you can close this encounter.   Thanks,  Star Age, MD, PhD [facility])

## 2016-10-07 ENCOUNTER — Telehealth: Payer: Self-pay

## 2016-10-07 NOTE — Telephone Encounter (Signed)
I called pt, spoke to pt's wife, [name], per DPR. I advised her that Dr. Rexene Alberts reviewed pt's sleep study and found that it did not show any significant sleep apnea but this may be an underestimation and given pt's clinical symptoms and medical history, Dr. Rexene Alberts recommends that pt return for an attended sleep study and we will work with pt's insurance to try and get that study approved. Pt's wife is agreeable to this. Pt's wife verbalized understanding of results. Pt's wife had no questions at this time but was encouraged to call back if questions arise.

## 2016-10-07 NOTE — Telephone Encounter (Signed)
-----   Message from Star Age, MD sent at 10/06/2016  7:01 PM EDT ----- Patient referred by Dr. Luan Pulling for tremor, seen by me on 08/31/16, HST on 10/05/16.   Please call and notify the patient that the recent home sleep test did not show any significant obstructive sleep apnea, but may be an underestimation and as there is clinical concern for OSA (large neck circumference, morbid obesity, witnessed apneas, per spouse), and given his medical history of COPD and heart disease (s/p AVR, CABG), I would recommend an attended sleep study. I will order a sleep study to help with Dx.  Shirlean Mylar, please assist with auth process if needed, as we did a HST first.  Once you have spoken to patient, you can close this encounter.   Thanks,  Star Age, MD, PhD [facility])

## 2016-10-21 LAB — PULMONARY FUNCTION TEST
DL/VA % pred: 91 %
DL/VA: 4 ml/min/mmHg/L
DLCO UNC: 18.58 ml/min/mmHg
DLCO unc % pred: 67 %
FEF 25-75 Post: 3.55 L/sec
FEF 25-75 Pre: 2.11 L/sec
FEF2575-%Change-Post: 67 %
FEF2575-%Pred-Post: 170 %
FEF2575-%Pred-Pre: 101 %
FEV1-%CHANGE-POST: 10 %
FEV1-%Pred-Post: 91 %
FEV1-%Pred-Pre: 82 %
FEV1-POST: 2.52 L
FEV1-PRE: 2.27 L
FEV1FVC-%Change-Post: 7 %
FEV1FVC-%Pred-Pre: 109 %
FEV6-%Change-Post: 2 %
FEV6-%PRED-PRE: 79 %
FEV6-%Pred-Post: 81 %
FEV6-POST: 2.91 L
FEV6-Pre: 2.83 L
FEV6FVC-%CHANGE-POST: 0 %
FEV6FVC-%PRED-POST: 106 %
FEV6FVC-%Pred-Pre: 106 %
FVC-%Change-Post: 2 %
FVC-%PRED-PRE: 74 %
FVC-%Pred-Post: 76 %
FVC-POST: 2.92 L
FVC-PRE: 2.83 L
POST FEV6/FVC RATIO: 100 %
PRE FEV1/FVC RATIO: 80 %
PRE FEV6/FVC RATIO: 100 %
Post FEV1/FVC ratio: 86 %
RV % pred: 106 %
RV: 2.43 L
TLC % PRED: 85 %
TLC: 5.37 L

## 2016-12-07 ENCOUNTER — Ambulatory Visit: Payer: Managed Care, Other (non HMO) | Admitting: Neurology

## 2017-07-06 ENCOUNTER — Other Ambulatory Visit (HOSPITAL_COMMUNITY): Payer: Self-pay | Admitting: Pulmonary Disease

## 2017-07-06 ENCOUNTER — Ambulatory Visit (HOSPITAL_COMMUNITY)
Admission: RE | Admit: 2017-07-06 | Discharge: 2017-07-06 | Disposition: A | Discharge status: Home / Self Care | Payer: Managed Care, Other (non HMO) | Source: Ambulatory Visit | Attending: Pulmonary Disease | Admitting: Pulmonary Disease

## 2017-07-06 DIAGNOSIS — R55 Syncope and collapse: Secondary | ICD-10-CM

## 2017-07-07 ENCOUNTER — Ambulatory Visit (HOSPITAL_COMMUNITY)
Admission: RE | Admit: 2017-07-07 | Discharge: 2017-07-07 | Disposition: A | Discharge status: Home / Self Care | Payer: Managed Care, Other (non HMO) | Source: Ambulatory Visit | Attending: Pulmonary Disease | Admitting: Pulmonary Disease

## 2017-07-07 DIAGNOSIS — I5030 Unspecified diastolic (congestive) heart failure: Secondary | ICD-10-CM | POA: Insufficient documentation

## 2017-07-07 DIAGNOSIS — R55 Syncope and collapse: Secondary | ICD-10-CM | POA: Diagnosis present

## 2017-07-07 DIAGNOSIS — I420 Dilated cardiomyopathy: Secondary | ICD-10-CM | POA: Insufficient documentation

## 2017-07-07 LAB — ECHOCARDIOGRAM COMPLETE
AV Area VTI index: 0.37 cm2/m2
AV Area VTI: 0.84 cm2
AV Area mean vel: 0.76 cm2
AV Mean grad: 29 mmHg
AV area mean vel ind: 0.29 cm2/m2
AV peak Index: 0.32
AV vel: 0.98
AVA: 0.98 cm2
AVCELMEANRAT: 0.3
AVLVOTPG: 5 mmHg
AVPG: 49 mmHg
AVPKVEL: 351 cm/s
Ao pk vel: 0.33 m/s
CHL CUP AV VALUE AREA INDEX: 0.37
CHL CUP DOP CALC LVOT VTI: 29.7 cm
CHL CUP MV DEC (S): 327
CHL CUP STROKE VOLUME: 59 mL
DOP CAL AO MEAN VELOCITY: 251 cm/s
E/e' ratio: 24.79
EWDT: 327 ms
FS: 39 % (ref 28–44)
IVS/LV PW RATIO, ED: 1.03
LA vol A4C: 98.4 ml
LA vol: 81.7 mL
LADIAMINDEX: 1.44 cm/m2
LASIZE: 38 mm
LAVOLIN: 31 mL/m2
LDCA: 2.54 cm2
LEFT ATRIUM END SYS DIAM: 38 mm
LV E/e' medial: 24.79
LV PW d: 15.3 mm — AB (ref 0.6–1.1)
LV dias vol: 88 mL (ref 62–150)
LV sys vol index: 11 mL/m2
LV sys vol: 28 mL (ref 21–61)
LVDIAVOLIN: 33 mL/m2
LVEEAVG: 24.79
LVELAT: 4.68 cm/s
LVOT peak VTI: 0.39 cm
LVOT peak vel: 116 cm/s
LVOTD: 18 mm
LVOTSV: 75 mL
MV pk A vel: 143 m/s
MVPG: 5 mmHg
MVPKEVEL: 116 m/s
Simpson's disk: 68
TDI e' lateral: 4.68
TDI e' medial: 4.24
VTI: 76.9 cm

## 2017-07-07 NOTE — Progress Notes (Signed)
*  PRELIMINARY RESULTS* Echocardiogram 2D Echocardiogram has been performed.  [name] 07/07/2017, 11:31 AM

## 2017-07-09 ENCOUNTER — Other Ambulatory Visit (HOSPITAL_COMMUNITY): Payer: Self-pay | Admitting: Pulmonary Disease

## 2017-07-09 DIAGNOSIS — R55 Syncope and collapse: Secondary | ICD-10-CM

## 2017-07-13 ENCOUNTER — Other Ambulatory Visit (HOSPITAL_COMMUNITY): Payer: Self-pay | Admitting: Pulmonary Disease

## 2017-07-13 DIAGNOSIS — I739 Peripheral vascular disease, unspecified: Secondary | ICD-10-CM

## 2017-07-14 ENCOUNTER — Ambulatory Visit (HOSPITAL_COMMUNITY)
Admission: RE | Admit: 2017-07-14 | Discharge: 2017-07-14 | Disposition: A | Discharge status: Home / Self Care | Payer: Managed Care, Other (non HMO) | Source: Ambulatory Visit | Attending: Pulmonary Disease | Admitting: Pulmonary Disease

## 2017-07-14 DIAGNOSIS — R55 Syncope and collapse: Secondary | ICD-10-CM | POA: Insufficient documentation

## 2017-07-14 DIAGNOSIS — I739 Peripheral vascular disease, unspecified: Secondary | ICD-10-CM

## 2017-07-16 ENCOUNTER — Other Ambulatory Visit (HOSPITAL_COMMUNITY): Payer: Self-pay | Admitting: Pulmonary Disease

## 2017-07-16 DIAGNOSIS — R109 Unspecified abdominal pain: Secondary | ICD-10-CM

## 2017-07-20 ENCOUNTER — Ambulatory Visit (HOSPITAL_COMMUNITY)
Admission: RE | Admit: 2017-07-20 | Discharge: 2017-07-20 | Disposition: A | Discharge status: Home / Self Care | Payer: Managed Care, Other (non HMO) | Source: Ambulatory Visit | Attending: Pulmonary Disease | Admitting: Pulmonary Disease

## 2017-07-20 DIAGNOSIS — R109 Unspecified abdominal pain: Secondary | ICD-10-CM | POA: Diagnosis not present

## 2017-07-20 DIAGNOSIS — K760 Fatty (change of) liver, not elsewhere classified: Secondary | ICD-10-CM | POA: Insufficient documentation

## 2019-01-26 LAB — COMPREHENSIVE METABOLIC PANEL
Albumin: 3.8 (ref 3.5–5.0)
Calcium: 9 (ref 8.7–10.7)
GFR calc Af Amer: 27
GFR calc non Af Amer: 24
Globulin: 3

## 2019-01-26 LAB — CBC AND DIFFERENTIAL
HCT: 42 (ref 41–53)
Hemoglobin: 13.8 (ref 13.5–17.5)
Neutrophils Absolute: 5050
Platelets: 182 (ref 150–399)
WBC: 9.6

## 2019-01-26 LAB — TSH: TSH: 0.12 — AB (ref <=5.90)

## 2019-01-26 LAB — HEPATIC FUNCTION PANEL
ALT: 12 (ref 10–40)
AST: 16 (ref 14–40)
Alkaline Phosphatase: 68 (ref 25–125)

## 2019-01-26 LAB — LIPID PANEL
Cholesterol: 169 (ref 0–200)
HDL: 45 (ref 35–70)
LDL Cholesterol: 99
Triglycerides: 150 (ref 40–160)

## 2019-01-26 LAB — BASIC METABOLIC PANEL
BUN: 42 — AB (ref 4–21)
CO2: 24 — AB (ref 13–22)
Chloride: 108 (ref 99–108)
Creatinine: 2.6 — AB (ref <=1.3)
Glucose: 118
Potassium: 5.6 — AB (ref 3.4–5.3)
Sodium: 142 (ref 137–147)

## 2019-01-26 LAB — PSA: PSA: 0.4

## 2019-01-26 LAB — CBC: RBC: 4.38 (ref 3.87–5.11)

## 2019-06-11 DIAGNOSIS — E785 Hyperlipidemia, unspecified: Secondary | ICD-10-CM

## 2019-06-11 DIAGNOSIS — I10 Essential (primary) hypertension: Secondary | ICD-10-CM

## 2019-06-11 DIAGNOSIS — J449 Chronic obstructive pulmonary disease, unspecified: Secondary | ICD-10-CM | POA: Insufficient documentation

## 2019-06-11 DIAGNOSIS — E039 Hypothyroidism, unspecified: Secondary | ICD-10-CM | POA: Insufficient documentation

## 2019-06-11 DIAGNOSIS — I350 Nonrheumatic aortic (valve) stenosis: Secondary | ICD-10-CM

## 2019-06-11 DIAGNOSIS — F329 Major depressive disorder, single episode, unspecified: Secondary | ICD-10-CM

## 2019-06-11 DIAGNOSIS — I2510 Atherosclerotic heart disease of native coronary artery without angina pectoris: Secondary | ICD-10-CM

## 2019-06-11 DIAGNOSIS — I951 Orthostatic hypotension: Secondary | ICD-10-CM

## 2019-06-11 DIAGNOSIS — M109 Gout, unspecified: Secondary | ICD-10-CM

## 2019-09-21 ENCOUNTER — Ambulatory Visit: Payer: Managed Care, Other (non HMO) | Admitting: Family Medicine

## 2019-11-30 ENCOUNTER — Other Ambulatory Visit: Payer: Self-pay | Admitting: Nephrology

## 2019-11-30 ENCOUNTER — Telehealth (HOSPITAL_COMMUNITY): Payer: Self-pay | Admitting: Nephrology

## 2019-11-30 ENCOUNTER — Other Ambulatory Visit (HOSPITAL_COMMUNITY): Payer: Self-pay | Admitting: Nephrology

## 2019-11-30 DIAGNOSIS — D638 Anemia in other chronic diseases classified elsewhere: Secondary | ICD-10-CM

## 2019-11-30 DIAGNOSIS — R809 Proteinuria, unspecified: Secondary | ICD-10-CM

## 2019-11-30 DIAGNOSIS — Z79899 Other long term (current) drug therapy: Secondary | ICD-10-CM

## 2019-11-30 DIAGNOSIS — N184 Chronic kidney disease, stage 4 (severe): Secondary | ICD-10-CM

## 2019-11-30 DIAGNOSIS — I5032 Chronic diastolic (congestive) heart failure: Secondary | ICD-10-CM

## 2019-11-30 DIAGNOSIS — E875 Hyperkalemia: Secondary | ICD-10-CM

## 2019-11-30 DIAGNOSIS — I129 Hypertensive chronic kidney disease with stage 1 through stage 4 chronic kidney disease, or unspecified chronic kidney disease: Secondary | ICD-10-CM

## 2019-11-30 DIAGNOSIS — E872 Acidosis, unspecified: Secondary | ICD-10-CM

## 2019-11-30 DIAGNOSIS — E559 Vitamin D deficiency, unspecified: Secondary | ICD-10-CM

## 2019-11-30 NOTE — Telephone Encounter (Signed)
11/30/19~LVM. MF

## 2019-12-06 ENCOUNTER — Ambulatory Visit (HOSPITAL_COMMUNITY)
Admission: RE | Admit: 2019-12-06 | Discharge: 2019-12-06 | Disposition: A | Discharge status: Home / Self Care | Payer: Managed Care, Other (non HMO) | Source: Ambulatory Visit | Attending: Nephrology | Admitting: Nephrology

## 2019-12-06 ENCOUNTER — Other Ambulatory Visit: Payer: Self-pay

## 2019-12-06 DIAGNOSIS — I5032 Chronic diastolic (congestive) heart failure: Secondary | ICD-10-CM | POA: Insufficient documentation

## 2019-12-06 DIAGNOSIS — D638 Anemia in other chronic diseases classified elsewhere: Secondary | ICD-10-CM | POA: Insufficient documentation

## 2019-12-06 DIAGNOSIS — I129 Hypertensive chronic kidney disease with stage 1 through stage 4 chronic kidney disease, or unspecified chronic kidney disease: Secondary | ICD-10-CM | POA: Diagnosis present

## 2019-12-06 DIAGNOSIS — Z79899 Other long term (current) drug therapy: Secondary | ICD-10-CM | POA: Diagnosis present

## 2019-12-06 DIAGNOSIS — E875 Hyperkalemia: Secondary | ICD-10-CM | POA: Diagnosis present

## 2019-12-06 DIAGNOSIS — E872 Acidosis, unspecified: Secondary | ICD-10-CM

## 2019-12-06 DIAGNOSIS — R809 Proteinuria, unspecified: Secondary | ICD-10-CM | POA: Diagnosis present

## 2019-12-06 DIAGNOSIS — N184 Chronic kidney disease, stage 4 (severe): Secondary | ICD-10-CM | POA: Insufficient documentation

## 2019-12-06 DIAGNOSIS — E559 Vitamin D deficiency, unspecified: Secondary | ICD-10-CM | POA: Diagnosis present

## 2020-01-26 ENCOUNTER — Other Ambulatory Visit (HOSPITAL_COMMUNITY): Payer: Self-pay | Admitting: Nephrology

## 2020-01-26 DIAGNOSIS — D631 Anemia in chronic kidney disease: Secondary | ICD-10-CM

## 2020-01-26 DIAGNOSIS — E211 Secondary hyperparathyroidism, not elsewhere classified: Secondary | ICD-10-CM

## 2020-01-26 DIAGNOSIS — D472 Monoclonal gammopathy: Secondary | ICD-10-CM

## 2020-01-26 DIAGNOSIS — N400 Enlarged prostate without lower urinary tract symptoms: Secondary | ICD-10-CM

## 2020-01-26 DIAGNOSIS — R809 Proteinuria, unspecified: Secondary | ICD-10-CM

## 2020-01-26 DIAGNOSIS — N184 Chronic kidney disease, stage 4 (severe): Secondary | ICD-10-CM

## 2020-01-26 DIAGNOSIS — I5032 Chronic diastolic (congestive) heart failure: Secondary | ICD-10-CM

## 2020-02-02 ENCOUNTER — Encounter (HOSPITAL_COMMUNITY): Payer: Self-pay

## 2020-02-05 ENCOUNTER — Ambulatory Visit (HOSPITAL_COMMUNITY)
Admission: RE | Admit: 2020-02-05 | Discharge: 2020-02-05 | Disposition: A | Discharge status: Home / Self Care | Payer: Managed Care, Other (non HMO) | Source: Ambulatory Visit | Attending: Hematology | Admitting: Hematology

## 2020-02-05 ENCOUNTER — Other Ambulatory Visit: Payer: Self-pay

## 2020-02-05 ENCOUNTER — Inpatient Hospital Stay (HOSPITAL_COMMUNITY): Payer: Managed Care, Other (non HMO) | Attending: Hematology | Admitting: Hematology

## 2020-02-05 ENCOUNTER — Inpatient Hospital Stay (HOSPITAL_COMMUNITY): Payer: Managed Care, Other (non HMO)

## 2020-02-05 VITALS — BP 160/68 | HR 69 | Temp 97.8°F | Resp 18 | Ht 66.75 in | Wt 274.7 lb

## 2020-02-05 DIAGNOSIS — D472 Monoclonal gammopathy: Secondary | ICD-10-CM | POA: Insufficient documentation

## 2020-02-05 DIAGNOSIS — N184 Chronic kidney disease, stage 4 (severe): Secondary | ICD-10-CM | POA: Diagnosis not present

## 2020-02-05 DIAGNOSIS — I129 Hypertensive chronic kidney disease with stage 1 through stage 4 chronic kidney disease, or unspecified chronic kidney disease: Secondary | ICD-10-CM | POA: Insufficient documentation

## 2020-02-05 DIAGNOSIS — Z87891 Personal history of nicotine dependence: Secondary | ICD-10-CM | POA: Diagnosis not present

## 2020-02-05 DIAGNOSIS — Z809 Family history of malignant neoplasm, unspecified: Secondary | ICD-10-CM | POA: Diagnosis not present

## 2020-02-05 DIAGNOSIS — R809 Proteinuria, unspecified: Secondary | ICD-10-CM | POA: Diagnosis not present

## 2020-02-05 DIAGNOSIS — Z801 Family history of malignant neoplasm of trachea, bronchus and lung: Secondary | ICD-10-CM | POA: Diagnosis not present

## 2020-02-05 LAB — LACTATE DEHYDROGENASE: LDH: 197 U/L — ABNORMAL HIGH (ref 98–192)

## 2020-02-05 LAB — CBC WITH DIFFERENTIAL/PLATELET
Abs Immature Granulocytes: 0.04 10*3/uL (ref 0.00–0.07)
Basophils Absolute: 0.1 10*3/uL (ref 0.0–0.1)
Basophils Relative: 1 %
Eosinophils Absolute: 0.4 10*3/uL (ref 0.0–0.5)
Eosinophils Relative: 5 %
HCT: 37.3 % — ABNORMAL LOW (ref 39.0–52.0)
Hemoglobin: 11.8 g/dL — ABNORMAL LOW (ref 13.0–17.0)
Immature Granulocytes: 0 %
Lymphocytes Relative: 32 %
Lymphs Abs: 2.9 10*3/uL (ref 0.7–4.0)
MCH: 31.4 pg (ref 26.0–34.0)
MCHC: 31.6 g/dL (ref 30.0–36.0)
MCV: 99.2 fL (ref 80.0–100.0)
Monocytes Absolute: 0.6 10*3/uL (ref 0.1–1.0)
Monocytes Relative: 6 %
Neutro Abs: 5.1 10*3/uL (ref 1.7–7.7)
Neutrophils Relative %: 56 %
Platelets: 162 10*3/uL (ref 150–400)
RBC: 3.76 MIL/uL — ABNORMAL LOW (ref 4.22–5.81)
RDW: 14.8 % (ref 11.5–15.5)
WBC: 9.1 10*3/uL (ref 4.0–10.5)
nRBC: 0 % (ref 0.0–0.2)

## 2020-02-05 LAB — COMPREHENSIVE METABOLIC PANEL
ALT: 13 U/L (ref 0–44)
AST: 16 U/L (ref 15–41)
Albumin: 3.3 g/dL — ABNORMAL LOW (ref 3.5–5.0)
Alkaline Phosphatase: 69 U/L (ref 38–126)
Anion gap: 8 (ref 5–15)
BUN: 48 mg/dL — ABNORMAL HIGH (ref 8–23)
CO2: 21 mmol/L — ABNORMAL LOW (ref 22–32)
Calcium: 8.7 mg/dL — ABNORMAL LOW (ref 8.9–10.3)
Chloride: 111 mmol/L (ref 98–111)
Creatinine, Ser: 3.63 mg/dL — ABNORMAL HIGH (ref 0.61–1.24)
GFR calc Af Amer: 18 mL/min — ABNORMAL LOW (ref >60)
GFR calc non Af Amer: 16 mL/min — ABNORMAL LOW (ref >60)
Glucose, Bld: 104 mg/dL — ABNORMAL HIGH (ref 70–99)
Potassium: 5 mmol/L (ref 3.5–5.1)
Sodium: 140 mmol/L (ref 135–145)
Total Bilirubin: 0.4 mg/dL (ref 0.3–1.2)
Total Protein: 6.7 g/dL (ref 6.5–8.1)

## 2020-02-05 NOTE — Progress Notes (Signed)
[place], [address] [address]   CLINIC:  Medical Oncology/Hematology  Patient Care Team: Sinda Du, MD as PCP - General (Internal Medicine) Fay Records, MD as Attending Physician (Cardiology)  CHIEF COMPLAINTS/PURPOSE OF CONSULTATION:  Evaluation of M spike on UPEP  HISTORY OF PRESENTING ILLNESS:  [name] 74 y.o. male is here because of evaluation of M spike on UPEP, at the request of Dr. Ulice Bold of [facility]. 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  Today he denies having any new pains, recent infections, F/C, night sweats, or unexpected weight changes. He complains of ankle swelling which is a chronic issue and he has tingling in bilat feet over the last 1 year. His appetite is good.   He is scheduled for a renal biopsy in [place] on 2023-03-04. Father deceased from lung cancer; mother deceased from lung cancer; paternal uncle had some cancer. 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 He has Round-Up exposure on an almost daily basis. He has been vaping for 5 years and quit smoking in 2016 after smoking 3 PPD for 52 years.   MEDICAL HISTORY:  Past Medical History:  Diagnosis Date  . Aortic stenosis   . Arthritis   . Atherosclerotic heart disease of native coronary artery without angina pectoris   . CAD (coronary artery disease)   . Chronic obstructive pulmonary disease, unspecified ([place])   . COPD (chronic obstructive pulmonary disease) ([place])   . Depression   . Essential (primary) hypertension   . Gout, unspecified   . Heart murmur   . Hemorrhoid   . Hyperlipidemia, unspecified   . Hypertension   . Hypothyroidism   . Hypothyroidism, unspecified   . Kidney stones   . Major depressive disorder, single episode, unspecified   . Morbid (severe) obesity due to excess calories ([place])   . Nonrheumatic aortic (valve) stenosis   .  Orthostatic hypotension   . Syncope   . Thyroid disease     SURGICAL HISTORY: Past Surgical History:  Procedure Laterality Date  . ANGIOPASTY    . AORTIC VALVE REPLACEMENT  07/11/2012   Procedure: AORTIC VALVE REPLACEMENT (AVR);  Surgeon: Gaye Pollack, MD;  Location: [place];  Service: Open Heart Surgery;  Laterality: N/A;  . CARDIAC CATHETERIZATION    . CAROTID ENDARTERECTOMY    . CORONARY ARTERY BYPASS GRAFT  07/11/2012   Procedure: CORONARY ARTERY BYPASS GRAFTING (CABG);  Surgeon: Gaye Pollack, MD;  Location: [place];  Service: Open Heart Surgery;  Laterality: N/A;  CABG x one,  using right leg greater saphenous vein harvested endoscopically  . INTRAOPERATIVE TRANSESOPHAGEAL ECHOCARDIOGRAM  07/11/2012   Procedure: INTRAOPERATIVE TRANSESOPHAGEAL ECHOCARDIOGRAM;  Surgeon: Gaye Pollack, MD;  Location: [facility] OR;  Service: Open Heart Surgery;  Laterality: N/A;  . JOINT REPLACEMENT      SOCIAL HISTORY: Social History   Socioeconomic History  . Marital status: Married    Spouse name: Not on file  . Number of children: 1  . Years of education: HS  . Highest education level: Not on file  Occupational History  . Occupation: Retired   Tobacco Use  . Smoking status: Former Smoker    Packs/day: 3.00    Years: 50.00    Pack years: 150.00    Types: Cigarettes    Quit date: 12/13/2010    Years since quitting: 9.1  . Smokeless tobacco:  Former Systems developer  . Tobacco comment: Electronic cigarette...USES INFREQUENTLY NOW  Vaping Use  . Vaping Use: Every day  . Start date: 02/01/2013  Substance and Sexual Activity  . Alcohol use: Yes    Comment: Rare  . Drug use: No  . Sexual activity: Not Currently  Other Topics Concern  . Not on file  Social History Narrative   Drinks about 3 cups of coffee a day, drinks about 2 sundrops a day    Social Determinants of Health   Financial Resource Strain:   . Difficulty of Paying Living Expenses:   Food Insecurity:   . Worried About Charity fundraiser in  the Last Year:   . Arboriculturist in the Last Year:   Transportation Needs:   . Film/video editor (Medical):   Marland Kitchen Lack of Transportation (Non-Medical):   Physical Activity:   . Days of Exercise per Week:   . Minutes of Exercise per Session:   Stress:   . Feeling of Stress :   Social Connections:   . Frequency of Communication with Friends and Family:   . Frequency of Social Gatherings with Friends and Family:   . Attends Religious Services:   . Active Member of Clubs or Organizations:   . Attends Archivist Meetings:   Marland Kitchen Marital Status:   Intimate Partner Violence:   . Fear of Current or Ex-Partner:   . Emotionally Abused:   Marland Kitchen Physically Abused:   . Sexually Abused:     FAMILY HISTORY: Family History  Problem Relation Age of Onset  . Heart disease Mother   . Heart disease Father   . Hypertension Father     ALLERGIES:  has No Known Allergies.  MEDICATIONS:  Current Outpatient Medications  Medication Sig Dispense Refill  . albuterol (PROVENTIL HFA;VENTOLIN HFA) 108 (90 BASE) MCG/ACT inhaler Inhale 2 puffs into the lungs every 6 (six) hours as needed. For wheezing    . allopurinol (ZYLOPRIM) 300 MG tablet Take 300 mg by mouth daily.    Marland Kitchen amLODipine (NORVASC) 5 MG tablet Take 5 mg by mouth daily.    Marland Kitchen aspirin 325 MG tablet Take 325 mg by mouth in the morning and at bedtime.     . calcitRIOL (ROCALTROL) 0.25 MCG capsule Take 0.25 mcg by mouth daily.    . Cholecalciferol (VITAMIN D3) 125 MCG (5000 UT) CAPS Take 10,000 Units by mouth in the morning and at bedtime.    . finasteride (PROSCAR) 5 MG tablet Take 5 mg by mouth daily.    . fluticasone (FLONASE) 50 MCG/ACT nasal spray Place 1 spray into both nostrils daily.    Marland Kitchen L-Lysine 1000 MG TABS Take 1,000 mg by mouth daily.     Marland Kitchen levothyroxine (SYNTHROID) 150 MCG tablet Take 150 mcg by mouth daily before breakfast.     . Magnesium 250 MG TABS Take 250 mg by mouth daily.    . metoprolol tartrate (LOPRESSOR) 25 MG  tablet Take 25 mg by mouth 2 (two) times daily.    . Multiple Vitamin (MULTIVITAMIN) tablet Take 1 tablet by mouth daily.    Marland Kitchen olmesartan (BENICAR) 5 MG tablet Take 5 mg by mouth daily.    . Omega-3 Fatty Acids (FISH OIL PO) Take 3,200 mg by mouth daily. 1600 mg per cap    . sertraline (ZOLOFT) 100 MG tablet Take 100 mg by mouth daily.    . simvastatin (ZOCOR) 40 MG tablet Take 40 mg by mouth every evening.    Marland Kitchen  sodium bicarbonate 650 MG tablet Take 650 mg by mouth 3 (three) times daily.    . Travoprost, BAK Free, (TRAVATAN) 0.004 % SOLN ophthalmic solution Place 1 drop into both eyes at bedtime.     No current facility-administered medications for this visit.    REVIEW OF SYSTEMS:   Review of Systems  Constitutional: Positive for appetite change (mildly decreased) and fatigue (moderate). Negative for chills, diaphoresis, fever and unexpected weight change.  HENT:   Positive for trouble swallowing (chewing difficulty).   Respiratory: Positive for cough and shortness of breath.   Cardiovascular: Positive for chest pain.  Genitourinary: Positive for nocturia.   Neurological: Positive for dizziness, headaches and numbness (feet).  Psychiatric/Behavioral: Positive for depression and sleep disturbance. The patient is nervous/anxious.   All other systems reviewed and are negative.    PHYSICAL EXAMINATION: ECOG PERFORMANCE STATUS: 1 - Symptomatic but completely ambulatory  Vitals:   02/05/20 1310  BP: (!) 160/68  Pulse: 69  Resp: 18  Temp: 97.8 F (36.6 C)  SpO2: 96%   Filed Weights   02/05/20 1310  Weight: 274 lb 11.2 oz (124.6 kg)   Physical Exam Vitals reviewed.  Constitutional:      Appearance: Normal appearance. He is obese.  Cardiovascular:     Rate and Rhythm: Normal rate and regular rhythm.     Pulses: Normal pulses.     Heart sounds: Normal heart sounds.  Pulmonary:     Effort: Pulmonary effort is normal.     Breath sounds: Normal breath sounds.  Abdominal:      Palpations: Abdomen is soft. There is no hepatomegaly, splenomegaly or mass.     Tenderness: There is no abdominal tenderness.     Hernia: No hernia is present.  Musculoskeletal:     Right lower leg: Edema (1+) present.     Left lower leg: Edema (1+) present.  Lymphadenopathy:     Cervical: No cervical adenopathy.     Upper Body:     Right upper body: No supraclavicular or axillary adenopathy.     Left upper body: No supraclavicular or axillary adenopathy.     Lower Body: No right inguinal adenopathy. No left inguinal adenopathy.  Neurological:     General: No focal deficit present.     Mental Status: He is alert and oriented to person, place, and time.  Psychiatric:        Mood and Affect: Mood normal.        Behavior: Behavior normal.      LABORATORY DATA:  I have reviewed the data as listed No results found for this or any previous visit (from the past 2160 hour(s)).  RADIOGRAPHIC STUDIES: I have personally reviewed the radiological images as listed and agreed with the findings in the report.  ASSESSMENT:  1.  Monoclonal gammopathy: -Patient seen at the request of Dr. Theador Hawthorne. -Proteinuria work-up revealed urine immunofixation positive for IgA kappa monoclonal protein. -Denies any fevers, night sweats or weight loss.  Denies any new bone pains.  2.  CKD: -Stage IV CKD since 2010, thought to be secondary to hypertension. -Ultrasound of the kidneys on 12/06/2019 showed increased echogenicity in both kidneys, likely from CKD.  Negative for hydronephrosis.  3.  Social and family history: -Quit smoking 5 years ago, smoked 3 packs/day for 52 years.  He has been vaping lately for the last 5 years. 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 -Both of his parents had lung cancer and were smokers.  Paternal uncle also had cancer, type unknown to the patient.   PLAN:  1.  Monoclonal gammopathy: -We discussed the normal pathology and prognosis of monoclonal  gammopathy. -I have recommended further work-up with SPEP, immunofixation, free light chains along with LDH and beta-2 microglobulin. -I have also recommended skeletal survey to evaluate for any lytic lesions.  2.  Nephrotic range proteinuria: -24-hour urine by Dr. Theador Hawthorne showed proteinuria of 15 grams. -He is scheduled for kidney biopsy on 02/07/2020.  3.  Hypertension: -Continue amlodipine 5 mg daily, Lopressor 25 mg twice daily.  Blood pressure today is 160/68.     Austin Jack, MD 02/05/20 2:08 PM  [place] [phone]   I, Milinda Antis, am acting as a scribe for Dr. Sanda Linger.  I, Austin Jack MD, have reviewed the above documentation for accuracy and completeness, and I agree with the above.

## 2020-02-05 NOTE — Patient Instructions (Signed)
[place] at [facility] Discharge Instructions  You were seen today by Dr. Delton Coombes. He went over your recent results. You had labs drawn for further analysis. You will be scheduled for an x-ray scan of your bones. Dr. Delton Coombes will see you back in 1 to 2 weeks for labs and follow up.   Thank you for choosing [place] at [facility] to provide your oncology and hematology care.  To afford each patient quality time with our provider, please arrive at least 15 minutes before your scheduled appointment time.   If you have a lab appointment with the [facility] please come in thru the Main Entrance and check in at the main information desk  You need to re-schedule your appointment should you arrive 10 or more minutes late.  We strive to give you quality time with our providers, and arriving late affects you and other patients whose appointments are after yours.  Also, if you no show three or more times for appointments you may be dismissed from the clinic at the providers discretion.     Again, thank you for choosing [facility].  Our hope is that these requests will decrease the amount of time that you wait before being seen by our physicians.       _____________________________________________________________  Should you have questions after your visit to [facility], please contact our office at [phone] between the hours of 8:00 a.m. and 4:30 p.m.  Voicemails left after 4:00 p.m. will not be returned until the following business day.  For prescription refill requests, have your pharmacy contact our office and allow 72 hours.    Cancer Center Support Programs:   > Cancer Support Group  2nd Tuesday of the month 1pm-2pm, Journey Room

## 2020-02-06 ENCOUNTER — Other Ambulatory Visit: Payer: Self-pay | Admitting: Student

## 2020-02-06 ENCOUNTER — Other Ambulatory Visit: Payer: Self-pay | Admitting: Radiology

## 2020-02-06 LAB — KAPPA/LAMBDA LIGHT CHAINS
Kappa free light chain: 105.1 mg/L — ABNORMAL HIGH (ref 3.3–19.4)
Kappa, lambda light chain ratio: 2.82 — ABNORMAL HIGH (ref 0.26–1.65)
Lambda free light chains: 37.3 mg/L — ABNORMAL HIGH (ref 5.7–26.3)

## 2020-02-07 ENCOUNTER — Ambulatory Visit (HOSPITAL_COMMUNITY)
Admission: RE | Admit: 2020-02-07 | Discharge: 2020-02-07 | Disposition: A | Discharge status: Home / Self Care | Payer: Managed Care, Other (non HMO) | Source: Ambulatory Visit | Attending: Nephrology | Admitting: Nephrology

## 2020-02-07 ENCOUNTER — Other Ambulatory Visit: Payer: Self-pay

## 2020-02-07 ENCOUNTER — Encounter (HOSPITAL_COMMUNITY): Payer: Self-pay

## 2020-02-07 DIAGNOSIS — I2510 Atherosclerotic heart disease of native coronary artery without angina pectoris: Secondary | ICD-10-CM | POA: Insufficient documentation

## 2020-02-07 DIAGNOSIS — I129 Hypertensive chronic kidney disease with stage 1 through stage 4 chronic kidney disease, or unspecified chronic kidney disease: Secondary | ICD-10-CM | POA: Diagnosis not present

## 2020-02-07 DIAGNOSIS — Z7982 Long term (current) use of aspirin: Secondary | ICD-10-CM | POA: Insufficient documentation

## 2020-02-07 DIAGNOSIS — N400 Enlarged prostate without lower urinary tract symptoms: Secondary | ICD-10-CM

## 2020-02-07 DIAGNOSIS — R011 Cardiac murmur, unspecified: Secondary | ICD-10-CM | POA: Diagnosis not present

## 2020-02-07 DIAGNOSIS — F329 Major depressive disorder, single episode, unspecified: Secondary | ICD-10-CM | POA: Insufficient documentation

## 2020-02-07 DIAGNOSIS — J449 Chronic obstructive pulmonary disease, unspecified: Secondary | ICD-10-CM | POA: Diagnosis not present

## 2020-02-07 DIAGNOSIS — E211 Secondary hyperparathyroidism, not elsewhere classified: Secondary | ICD-10-CM

## 2020-02-07 DIAGNOSIS — Z6841 Body Mass Index (BMI) 40.0 and over, adult: Secondary | ICD-10-CM | POA: Diagnosis not present

## 2020-02-07 DIAGNOSIS — Z79899 Other long term (current) drug therapy: Secondary | ICD-10-CM | POA: Insufficient documentation

## 2020-02-07 DIAGNOSIS — R809 Proteinuria, unspecified: Secondary | ICD-10-CM | POA: Diagnosis present

## 2020-02-07 DIAGNOSIS — E039 Hypothyroidism, unspecified: Secondary | ICD-10-CM | POA: Diagnosis not present

## 2020-02-07 DIAGNOSIS — Z7901 Long term (current) use of anticoagulants: Secondary | ICD-10-CM | POA: Insufficient documentation

## 2020-02-07 DIAGNOSIS — D472 Monoclonal gammopathy: Secondary | ICD-10-CM | POA: Diagnosis not present

## 2020-02-07 DIAGNOSIS — E785 Hyperlipidemia, unspecified: Secondary | ICD-10-CM | POA: Diagnosis not present

## 2020-02-07 DIAGNOSIS — N184 Chronic kidney disease, stage 4 (severe): Secondary | ICD-10-CM | POA: Insufficient documentation

## 2020-02-07 DIAGNOSIS — D631 Anemia in chronic kidney disease: Secondary | ICD-10-CM

## 2020-02-07 DIAGNOSIS — Z87891 Personal history of nicotine dependence: Secondary | ICD-10-CM | POA: Insufficient documentation

## 2020-02-07 DIAGNOSIS — I5032 Chronic diastolic (congestive) heart failure: Secondary | ICD-10-CM

## 2020-02-07 DIAGNOSIS — M109 Gout, unspecified: Secondary | ICD-10-CM | POA: Diagnosis not present

## 2020-02-07 LAB — CBC
HCT: 38 % — ABNORMAL LOW (ref 39.0–52.0)
Hemoglobin: 12.3 g/dL — ABNORMAL LOW (ref 13.0–17.0)
MCH: 31.5 pg (ref 26.0–34.0)
MCHC: 32.4 g/dL (ref 30.0–36.0)
MCV: 97.2 fL (ref 80.0–100.0)
Platelets: 136 10*3/uL — ABNORMAL LOW (ref 150–400)
RBC: 3.91 MIL/uL — ABNORMAL LOW (ref 4.22–5.81)
RDW: 14.7 % (ref 11.5–15.5)
WBC: 10.2 10*3/uL (ref 4.0–10.5)
nRBC: 0 % (ref 0.0–0.2)

## 2020-02-07 LAB — PROTIME-INR
INR: 1.1 (ref 0.8–1.2)
Prothrombin Time: 13.8 seconds (ref 11.4–15.2)

## 2020-02-07 LAB — BETA 2 MICROGLOBULIN, SERUM: Beta-2 Microglobulin: 7.2 mg/L — ABNORMAL HIGH (ref 0.6–2.4)

## 2020-02-07 MED ORDER — FENTANYL CITRATE (PF) 100 MCG/2ML IJ SOLN
INTRAMUSCULAR | Status: AC
  Filled 2020-02-07: qty 2

## 2020-02-07 MED ORDER — SODIUM CHLORIDE 0.9 % IV SOLN
INTRAVENOUS | Status: AC | PRN
  Administered 2020-02-07: 10 mL/h via INTRAVENOUS

## 2020-02-07 MED ORDER — SODIUM CHLORIDE 0.9 % IV SOLN: INTRAVENOUS | Status: DC

## 2020-02-07 MED ORDER — MIDAZOLAM HCL 2 MG/2ML IJ SOLN
INTRAMUSCULAR | Status: AC
  Filled 2020-02-07: qty 2

## 2020-02-07 MED ORDER — LIDOCAINE HCL (PF) 1 % IJ SOLN
INTRAMUSCULAR | Status: AC
  Filled 2020-02-07: qty 30

## 2020-02-07 MED ORDER — AMLODIPINE BESYLATE 5 MG PO TABS
5.0000 mg | ORAL_TABLET | Freq: Once | ORAL | Status: AC
  Administered 2020-02-07: 5 mg via ORAL
  Filled 2020-02-07: qty 1

## 2020-02-07 MED ORDER — GELATIN ABSORBABLE 12-7 MM EX MISC
CUTANEOUS | Status: AC
  Filled 2020-02-07: qty 1

## 2020-02-07 MED ORDER — MIDAZOLAM HCL 2 MG/2ML IJ SOLN
INTRAMUSCULAR | Status: AC | PRN
  Administered 2020-02-07: 1 mg via INTRAVENOUS

## 2020-02-07 MED ORDER — IRBESARTAN 75 MG PO TABS
37.5000 mg | ORAL_TABLET | Freq: Every day | ORAL | Status: DC
  Administered 2020-02-07: 37.5 mg via ORAL
  Filled 2020-02-07: qty 0.5

## 2020-02-07 MED ORDER — METOPROLOL TARTRATE 50 MG PO TABS
ORAL_TABLET | ORAL | Status: AC
  Filled 2020-02-07: qty 1

## 2020-02-07 MED ORDER — METOPROLOL TARTRATE 50 MG PO TABS
25.0000 mg | ORAL_TABLET | Freq: Once | ORAL | Status: AC
  Administered 2020-02-07: 25 mg via ORAL

## 2020-02-07 MED ORDER — FENTANYL CITRATE (PF) 100 MCG/2ML IJ SOLN
INTRAMUSCULAR | Status: AC | PRN
  Administered 2020-02-07: 50 ug via INTRAVENOUS

## 2020-02-07 NOTE — H&P (Signed)
Chief Complaint: Patient was seen in consultation today for random renal biopsy at the request of [place] S  Referring Physician(s): [place] [facility]  Supervising Physician: Aletta Edouard  Patient Status: [facility] - Out-pt  History of Present Illness: [name] is a 74 y.o. male   Known renal dysfunction approx 6 mo Referred to Nephrology per PMD CKD 4 and followed by Dr Auburn Bilberry  Evaluation of M spike on UPEP A faint monoclonal IgA free kappa light chain was detected on UPEP and IFE from 11/30/2019 Referred to Dr Delton Coombes for evaluation  Nephrotic range proteinuria Now scheduled for random renal biopsy      Past Medical History:  Diagnosis Date  . Aortic stenosis   . Arthritis   . Atherosclerotic heart disease of native coronary artery without angina pectoris   . CAD (coronary artery disease)   . Chronic obstructive pulmonary disease, unspecified ([place])   . COPD (chronic obstructive pulmonary disease) ([place])   . Depression   . Essential (primary) hypertension   . Gout, unspecified   . Heart murmur   . Hemorrhoid   . Hyperlipidemia, unspecified   . Hypertension   . Hypothyroidism   . Hypothyroidism, unspecified   . Kidney stones   . Major depressive disorder, single episode, unspecified   . Morbid (severe) obesity due to excess calories ([place])   . Nonrheumatic aortic (valve) stenosis   . Orthostatic hypotension   . Syncope   . Thyroid disease     Past Surgical History:  Procedure Laterality Date  . ANGIOPASTY    . AORTIC VALVE REPLACEMENT  07/11/2012   Procedure: AORTIC VALVE REPLACEMENT (AVR);  Surgeon: Gaye Pollack, MD;  Location: [place];  Service: Open Heart Surgery;  Laterality: N/A;  . CARDIAC CATHETERIZATION    . CAROTID ENDARTERECTOMY    . CORONARY ARTERY BYPASS GRAFT  07/11/2012   Procedure: CORONARY ARTERY BYPASS GRAFTING (CABG);  Surgeon: Gaye Pollack, MD;  Location: [place];  Service: Open Heart Surgery;  Laterality: N/A;   CABG x one,  using right leg greater saphenous vein harvested endoscopically  . INTRAOPERATIVE TRANSESOPHAGEAL ECHOCARDIOGRAM  07/11/2012   Procedure: INTRAOPERATIVE TRANSESOPHAGEAL ECHOCARDIOGRAM;  Surgeon: Gaye Pollack, MD;  Location: [facility] OR;  Service: Open Heart Surgery;  Laterality: N/A;  . JOINT REPLACEMENT      Allergies: Patient has no known allergies.  Medications: Prior to Admission medications   Medication Sig Start Date End Date Taking? Authorizing Provider  albuterol (PROVENTIL HFA;VENTOLIN HFA) 108 (90 BASE) MCG/ACT inhaler Inhale 2 puffs into the lungs every 6 (six) hours as needed. For wheezing   Yes [provider]  allopurinol (ZYLOPRIM) 300 MG tablet Take 300 mg by mouth daily.   Yes [provider]  amLODipine (NORVASC) 5 MG tablet Take 5 mg by mouth daily.   Yes [provider]  aspirin 325 MG tablet Take 325 mg by mouth in the morning and at bedtime.    Yes [provider]  calcitRIOL (ROCALTROL) 0.25 MCG capsule Take 0.25 mcg by mouth daily.   Yes [provider]  Cholecalciferol (VITAMIN D3) 125 MCG (5000 UT) CAPS Take 10,000 Units by mouth in the morning and at bedtime.   Yes [provider]  finasteride (PROSCAR) 5 MG tablet Take 5 mg by mouth daily.   Yes [provider]  fluticasone (FLONASE) 50 MCG/ACT nasal spray Place 1 spray into both nostrils daily.   Yes [provider]  L-Lysine 1000 MG TABS  Take 1,000 mg by mouth daily.    Yes [provider]  levothyroxine (SYNTHROID) 150 MCG tablet Take 150 mcg by mouth daily before breakfast.  05/12/12  Yes [provider]  Magnesium 250 MG TABS Take 250 mg by mouth daily.   Yes [provider]  metoprolol tartrate (LOPRESSOR) 25 MG tablet Take 25 mg by mouth 2 (two) times daily.   Yes [provider]  Multiple Vitamin (MULTIVITAMIN) tablet Take 1 tablet by mouth daily.   Yes [provider]  olmesartan  (BENICAR) 5 MG tablet Take 5 mg by mouth daily.   Yes [provider]  Omega-3 Fatty Acids (FISH OIL PO) Take 3,200 mg by mouth daily. 1600 mg per cap   Yes [provider]  sertraline (ZOLOFT) 100 MG tablet Take 100 mg by mouth daily.   Yes [provider]  simvastatin (ZOCOR) 40 MG tablet Take 40 mg by mouth every evening.   Yes [provider]  sodium bicarbonate 650 MG tablet Take 650 mg by mouth 3 (three) times daily.   Yes [provider]  Travoprost, BAK Free, (TRAVATAN) 0.004 % SOLN ophthalmic solution Place 1 drop into both eyes at bedtime.   Yes [provider]     Family History  Problem Relation Age of Onset  . Heart disease Mother   . Heart disease Father   . Hypertension Father     Social History   Socioeconomic History  . Marital status: Married    Spouse name: Not on file  . Number of children: 1  . Years of education: HS  . Highest education level: Not on file  Occupational History  . Occupation: Retired   Tobacco Use  . Smoking status: Former Smoker    Packs/day: 3.00    Years: 50.00    Pack years: 150.00    Types: Cigarettes    Quit date: 12/13/2010    Years since quitting: 9.1  . Smokeless tobacco: Former Systems developer  . Tobacco comment: Electronic cigarette...USES INFREQUENTLY NOW  Vaping Use  . Vaping Use: Every day  . Start date: 02/01/2013  Substance and Sexual Activity  . Alcohol use: Yes    Comment: Rare  . Drug use: No  . Sexual activity: Not Currently  Other Topics Concern  . Not on file  Social History Narrative   Drinks about 3 cups of coffee a day, drinks about 2 sundrops a day    Social Determinants of Health   Financial Resource Strain:   . Difficulty of Paying Living Expenses:   Food Insecurity:   . Worried About Charity fundraiser in the Last Year:   . Arboriculturist in the Last Year:   Transportation Needs:   . Film/video editor (Medical):   Marland Kitchen Lack of Transportation  (Non-Medical):   Physical Activity:   . Days of Exercise per Week:   . Minutes of Exercise per Session:   Stress:   . Feeling of Stress :   Social Connections:   . Frequency of Communication with Friends and Family:   . Frequency of Social Gatherings with Friends and Family:   . Attends Religious Services:   . Active Member of Clubs or Organizations:   . Attends Archivist Meetings:   Marland Kitchen Marital Status:     Review of Systems: A 12 point ROS discussed and pertinent positives are indicated in the HPI above.  All other systems are negative.  Review of Systems  Constitutional: Negative for activity change, fatigue and fever.  Respiratory: Negative for cough and shortness of breath.   Cardiovascular: Negative for chest pain.  Gastrointestinal: Negative for abdominal pain and nausea.  Neurological: Negative for weakness.  Psychiatric/Behavioral: Negative for behavioral problems and confusion.    Vital Signs: BP (!) 170/90   Pulse 63   Resp 18   Ht 5' 6.5" (1.689 m)   Wt 273 lb (123.8 kg)   SpO2 98%   BMI 43.40 kg/m   Physical Exam Vitals reviewed.  Cardiovascular:     Rate and Rhythm: Normal rate and regular rhythm.     Heart sounds: Normal heart sounds.  Pulmonary:     Effort: Pulmonary effort is normal.     Breath sounds: Normal breath sounds.  Abdominal:     Palpations: Abdomen is soft.     Tenderness: There is no abdominal tenderness.  Musculoskeletal:        General: Normal range of motion.  Skin:    General: Skin is warm.  Neurological:     Mental Status: He is alert and oriented to person, place, and time.  Psychiatric:        Behavior: Behavior normal.     Imaging: DG Bone Survey Met  Result Date: 02/05/2020 CLINICAL DATA:  Monoclonal gammopathy EXAM: METASTATIC BONE SURVEY COMPARISON:  None. FINDINGS: No focal lytic bone lesions are identified. Extensive calcifications are noted in the expected location of the right carotid bifurcation. Surgical  clips likely related to left carotid endarterectomy are identified. Degenerative changes are noted within the midthoracic spine. Extensive calcifications noted within the abdominal aorta and pelvis bilaterally. Median sternotomy has been performed. Extensive calcifications are seen within the SFAs bilaterally. Bilateral total knee arthroplasty has been performed. IMPRESSION: 1. No focal lytic bone lesions are identified. Electronically Signed   By: Fidela Salisbury MD   On: 02/05/2020 22:30    Labs:  CBC: Recent Labs    02/05/20 1405  WBC 9.1  HGB 11.8*  HCT 37.3*  PLT 162    COAGS: Recent Labs    02/07/20 0620  INR 1.1    BMP: Recent Labs    02/05/20 1405  NA 140  K 5.0  CL 111  CO2 21*  GLUCOSE 104*  BUN 48*  CALCIUM 8.7*  CREATININE 3.63*  GFRNONAA 16*  GFRAA 18*    LIVER FUNCTION TESTS: Recent Labs    02/05/20 1405  BILITOT 0.4  AST 16  ALT 13  ALKPHOS 69  PROT 6.7  ALBUMIN 3.3*    TUMOR MARKERS: No results for input(s): AFPTM, CEA, CA199, CHROMGRNA in the last 8760 hours.  Assessment and Plan:  CKD 4 Nephrotic range proteinuria Monoclonal gammopathy Scheduled for random renal biopsy Risks and benefits of random renal bx was discussed with the patient and/or patient's family including, but not limited to bleeding, infection, damage to adjacent structures or low yield requiring additional tests.  All of the questions were answered and there is agreement to proceed. Consent signed and in chart.   Thank you for this interesting consult.  I greatly enjoyed meeting NUCHEM GRATTAN and look forward to participating in their care.  A copy of this report was sent to the requesting provider on this date.  Electronically Signed: Lavonia Drafts, PA-C 02/07/2020, 7:25 AM   I spent a total of  30 Minutes   in face to face in clinical consultation, greater than 50% of which was counseling/coordinating care for random renal bx

## 2020-02-07 NOTE — Procedures (Signed)
Interventional Radiology Procedure Note  Procedure: US Guided Biopsy of left kidney  Complications: None  Estimated Blood Loss: < 10 mL  Findings: 1;6 G core biopsy of left renal cortex performed under US guidance.  Two core samples obtained and sent to Pathology.  Colton Tassin T. Melinda Pottinger, M.D Pager:  [phone]    

## 2020-02-07 NOTE — Discharge Instructions (Signed)
Percutaneous Kidney Biopsy, Care After This sheet gives you information about how to care for yourself after your procedure. Your health care provider may also give you more specific instructions. If you have problems or questions, contact your health care provider. What can I expect after the procedure? After the procedure, it is common to have:  Pain or soreness near the biopsy site.  Pink or cloudy urine for 24 hours after the procedure. Follow these instructions at home: Activity  Return to your normal activities as told by your health care provider. Ask your health care provider what activities are safe for you.  If you were given a sedative during the procedure, it can affect you for several hours. Do not drive or operate machinery until your health care provider says that it is safe.  Do not lift anything that is heavier than 10 lb (4.5 kg), or the limit that you are told, until your health care provider says that it is safe.  Avoid activities that take a lot of effort (are strenuous) until your health care provider approves. Most people will have to wait 2 weeks before returning to activities such as exercise or sex. General instructions   Take over-the-counter and prescription medicines only as told by your health care provider.  You may eat and drink after your procedure. Follow instructions from your health care provider about eating or drinking restrictions.  Check your biopsy site every day for signs of infection. Check for: ? More redness, swelling, or pain. ? Fluid or blood. ? Warmth. ? Pus or a bad smell.  Keep all follow-up visits as told by your health care provider. This is important. Contact a health care provider if:  You have more redness, swelling, or pain around your biopsy site.  You have fluid or blood coming from your biopsy site.  Your biopsy site feels warm to the touch.  You have pus or a bad smell coming from your biopsy site.  You have blood  in your urine more than 24 hours after your procedure.  You have a fever. Get help right away if:  Your urine is dark red or brown.  You cannot urinate.  It burns when you urinate.  You feel dizzy or light-headed.  You have severe pain in your abdomen or side. Summary  After the procedure, it is common to have pain or soreness at the biopsy site and pink or cloudy urine for the first 24 hours.  Check your biopsy site each day for signs of infection, such as more redness, swelling, or pain; fluid, blood, pus or a bad smell coming from the biopsy site; or the biopsy site feeling warm to touch.  Return to your normal activities as told by your health care provider. This information is not intended to replace advice given to you by your health care provider. Make sure you discuss any questions you have with your health care provider. Document Revised: 02/16/2019 Document Reviewed: 02/16/2019 Elsevier Patient Education  2020 Elsevier Inc. Moderate Conscious Sedation, Adult Sedation is the use of medicines to promote relaxation and relieve discomfort and anxiety. Moderate conscious sedation is a type of sedation. Under moderate conscious sedation, you are less alert than normal, but you are still able to respond to instructions, touch, or both. Moderate conscious sedation is used during short medical and dental procedures. It is milder than deep sedation, which is a type of sedation under which you cannot be easily woken up. It is also milder than   general anesthesia, which is the use of medicines to make you unconscious. Moderate conscious sedation allows you to return to your regular activities sooner. Tell a health care provider about:  Any allergies you have.  All medicines you are taking, including vitamins, herbs, eye drops, creams, and over-the-counter medicines.  Use of steroids (by mouth or creams).  Any problems you or family members have had with sedatives and anesthetic  medicines.  Any blood disorders you have.  Any surgeries you have had.  Any medical conditions you have, such as sleep apnea.  Whether you are pregnant or may be pregnant.  Any use of cigarettes, alcohol, marijuana, or street drugs. What are the risks? Generally, this is a safe procedure. However, problems may occur, including:  Getting too much medicine (oversedation).  Nausea.  Allergic reaction to medicines.  Trouble breathing. If this happens, a breathing tube may be used to help with breathing. It will be removed when you are awake and breathing on your own.  Heart trouble.  Lung trouble. What happens before the procedure? Staying hydrated Follow instructions from your health care provider about hydration, which may include:  Up to 2 hours before the procedure - you may continue to drink clear liquids, such as water, clear fruit juice, black coffee, and plain tea. Eating and drinking restrictions Follow instructions from your health care provider about eating and drinking, which may include:  8 hours before the procedure - stop eating heavy meals or foods such as meat, fried foods, or fatty foods.  6 hours before the procedure - stop eating light meals or foods, such as toast or cereal.  6 hours before the procedure - stop drinking milk or drinks that contain milk.  2 hours before the procedure - stop drinking clear liquids. Medicine Ask your health care provider about:  Changing or stopping your regular medicines. This is especially important if you are taking diabetes medicines or blood thinners.  Taking medicines such as aspirin and ibuprofen. These medicines can thin your blood. Do not take these medicines before your procedure if your health care provider instructs you not to.  Tests and exams  You will have a physical exam.  You may have blood tests done to show: ? How well your kidneys and liver are working. ? How well your blood can clot. General  instructions  Plan to have someone take you home from the hospital or clinic.  If you will be going home right after the procedure, plan to have someone with you for 24 hours. What happens during the procedure?  An IV tube will be inserted into one of your veins.  Medicine to help you relax (sedative) will be given through the IV tube.  The medical or dental procedure will be performed. What happens after the procedure?  Your blood pressure, heart rate, breathing rate, and blood oxygen level will be monitored often until the medicines you were given have worn off.  Do not drive for 24 hours. This information is not intended to replace advice given to you by your health care provider. Make sure you discuss any questions you have with your health care provider. Document Revised: 05/28/2017 Document Reviewed: 10/05/2015 Elsevier Patient Education  2020 Elsevier Inc.  

## 2020-02-08 LAB — MULTIPLE MYELOMA PANEL, SERUM
Albumin SerPl Elph-Mcnc: 3.3 g/dL (ref 2.9–4.4)
Albumin/Glob SerPl: 1.1 (ref 0.7–1.7)
Alpha 1: 0.2 g/dL (ref 0.0–0.4)
Alpha2 Glob SerPl Elph-Mcnc: 0.9 g/dL (ref 0.4–1.0)
B-Globulin SerPl Elph-Mcnc: 1.5 g/dL — ABNORMAL HIGH (ref 0.7–1.3)
Gamma Glob SerPl Elph-Mcnc: 0.5 g/dL (ref 0.4–1.8)
Globulin, Total: 3.2 g/dL (ref 2.2–3.9)
IgA: 787 mg/dL — ABNORMAL HIGH (ref 61–437)
IgG (Immunoglobin G), Serum: 598 mg/dL — ABNORMAL LOW (ref 603–1613)
IgM (Immunoglobulin M), Srm: 43 mg/dL (ref 15–143)
M Protein SerPl Elph-Mcnc: 0.5 g/dL — ABNORMAL HIGH
Total Protein ELP: 6.5 g/dL (ref 6.0–8.5)

## 2020-02-14 ENCOUNTER — Other Ambulatory Visit: Payer: Self-pay

## 2020-02-14 ENCOUNTER — Inpatient Hospital Stay (HOSPITAL_BASED_OUTPATIENT_CLINIC_OR_DEPARTMENT_OTHER): Payer: Managed Care, Other (non HMO) | Admitting: Hematology

## 2020-02-14 ENCOUNTER — Encounter (HOSPITAL_COMMUNITY): Payer: Self-pay

## 2020-02-14 VITALS — BP 189/75 | HR 74 | Temp 97.7°F | Resp 18

## 2020-02-14 DIAGNOSIS — D472 Monoclonal gammopathy: Secondary | ICD-10-CM | POA: Diagnosis not present

## 2020-02-14 LAB — SURGICAL PATHOLOGY

## 2020-02-14 NOTE — Patient Instructions (Signed)
[facility] Discharge Instructions  You were seen today by Dr. Katragadda. He went over your recent results and scans. Dr. Katragadda will see you back in 3 months for labs and follow up.   Thank you for choosing [facility] to provide your oncology and hematology care.  To afford each patient quality time with our provider, please arrive at least 15 minutes before your scheduled appointment time.   If you have a lab appointment with the Cancer Center please come in thru the Main Entrance and check in at the main information desk  You need to re-schedule your appointment should you arrive 10 or more minutes late.  We strive to give you quality time with our providers, and arriving late affects you and other patients whose appointments are after yours.  Also, if you no show three or more times for appointments you may be dismissed from the clinic at the providers discretion.     Again, thank you for choosing [facility].  Our hope is that these requests will decrease the amount of time that you wait before being seen by our physicians.       _____________________________________________________________  Should you have questions after your visit to  Cancer Center, please contact our office at [phone] between the hours of 8:00 a.m. and 4:30 p.m.  Voicemails left after 4:00 p.m. will not be returned until the following business day.  For prescription refill requests, have your pharmacy contact our office and allow 72 hours.    Cancer Center Support Programs:   > Cancer Support Group  2nd Tuesday of the month 1pm-2pm, Journey Room    

## 2020-02-14 NOTE — Progress Notes (Signed)
[place] [place], [place] [address]   CLINIC:  Medical Oncology/Hematology  PCP:  Celene Squibb, MD [address] [place] [address]  [phone]  REASON FOR VISIT:  Follow-up for MGUS  PRIOR THERAPY: None  CURRENT THERAPY: Under work-up  INTERVAL HISTORY:  Mr. [name], a 74 y.o. male, returns for routine follow-up for his MGUS. Travell was last seen on 02/05/2020.  Today he reports feeling well and denies having any new issues since the last visit. He had his kidney biopsy on 8/11.   REVIEW OF SYSTEMS:  Review of Systems  Constitutional: Positive for fatigue (mild). Negative for appetite change.  HENT:   Positive for trouble swallowing (difficulty chewing d/t teeth).   Respiratory: Positive for cough (d/t COPD).   Cardiovascular: Positive for leg swelling.  Genitourinary: Positive for frequency.   Neurological: Positive for dizziness.  Psychiatric/Behavioral: Positive for depression.  All other systems reviewed and are negative.   PAST MEDICAL/SURGICAL HISTORY:  Past Medical History:  Diagnosis Date  . Aortic stenosis   . Arthritis   . Atherosclerotic heart disease of native coronary artery without angina pectoris   . CAD (coronary artery disease)   . Chronic obstructive pulmonary disease, unspecified ([place])   . COPD (chronic obstructive pulmonary disease) ([place])   . Depression   . Essential (primary) hypertension   . Gout, unspecified   . Heart murmur   . Hemorrhoid   . Hyperlipidemia, unspecified   . Hypertension   . Hypothyroidism   . Hypothyroidism, unspecified   . Kidney stones   . Major depressive disorder, single episode, unspecified   . Morbid (severe) obesity due to excess calories ([place])   . Nonrheumatic aortic (valve) stenosis   . Orthostatic hypotension   . Syncope   . Thyroid disease    Past Surgical History:  Procedure Laterality Date  . ANGIOPASTY    . AORTIC VALVE REPLACEMENT  07/11/2012    Procedure: AORTIC VALVE REPLACEMENT (AVR);  Surgeon: Gaye Pollack, MD;  Location: [facility];  Service: Open Heart Surgery;  Laterality: N/A;  . CARDIAC CATHETERIZATION    . CAROTID ENDARTERECTOMY    . CORONARY ARTERY BYPASS GRAFT  07/11/2012   Procedure: CORONARY ARTERY BYPASS GRAFTING (CABG);  Surgeon: Gaye Pollack, MD;  Location: [facility];  Service: Open Heart Surgery;  Laterality: N/A;  CABG x one,  using right leg greater saphenous vein harvested endoscopically  . INTRAOPERATIVE TRANSESOPHAGEAL ECHOCARDIOGRAM  07/11/2012   Procedure: INTRAOPERATIVE TRANSESOPHAGEAL ECHOCARDIOGRAM;  Surgeon: Gaye Pollack, MD;  Location: [facility] OR;  Service: Open Heart Surgery;  Laterality: N/A;  . JOINT REPLACEMENT      SOCIAL HISTORY:  Social History   Socioeconomic History  . Marital status: Married    Spouse name: Not on file  . Number of children: 1  . Years of education: HS  . Highest education level: Not on file  Occupational History  . Occupation: Retired   Tobacco Use  . Smoking status: Former Smoker    Packs/day: 3.00    Years: 50.00    Pack years: 150.00    Types: Cigarettes    Quit date: 12/13/2010    Years since quitting: 9.1  . Smokeless tobacco: Former Systems developer  . Tobacco comment: Electronic cigarette...USES INFREQUENTLY NOW  Vaping Use  . Vaping Use: Every day  . Start date: 02/01/2013  Substance and Sexual Activity  . Alcohol use: Yes    Comment: Rare  .  Drug use: No  . Sexual activity: Not Currently  Other Topics Concern  . Not on file  Social History Narrative   Drinks about 3 cups of coffee a day, drinks about 2 sundrops a day    Social Determinants of Health   Financial Resource Strain:   . Difficulty of Paying Living Expenses:   Food Insecurity:   . Worried About Programme researcher, broadcasting/film/video in the Last Year:   . Barista in the Last Year:   Transportation Needs:   . Freight forwarder (Medical):   Marland Kitchen Lack of Transportation (Non-Medical):   Physical Activity:   . Days  of Exercise per Week:   . Minutes of Exercise per Session:   Stress:   . Feeling of Stress :   Social Connections:   . Frequency of Communication with Friends and Family:   . Frequency of Social Gatherings with Friends and Family:   . Attends Religious Services:   . Active Member of Clubs or Organizations:   . Attends Banker Meetings:   Marland Kitchen Marital Status:   Intimate Partner Violence:   . Fear of Current or Ex-Partner:   . Emotionally Abused:   Marland Kitchen Physically Abused:   . Sexually Abused:     FAMILY HISTORY:  Family History  Problem Relation Age of Onset  . Heart disease Mother   . Heart disease Father   . Hypertension Father     CURRENT MEDICATIONS:  Current Outpatient Medications  Medication Sig Dispense Refill  . albuterol (PROVENTIL HFA;VENTOLIN HFA) 108 (90 BASE) MCG/ACT inhaler Inhale 2 puffs into the lungs every 6 (six) hours as needed. For wheezing    . allopurinol (ZYLOPRIM) 300 MG tablet Take 300 mg by mouth daily.    Marland Kitchen amLODipine (NORVASC) 5 MG tablet Take 5 mg by mouth daily.    Marland Kitchen aspirin 325 MG tablet Take 325 mg by mouth in the morning and at bedtime.     . calcitRIOL (ROCALTROL) 0.25 MCG capsule Take 0.25 mcg by mouth daily.    . Cholecalciferol (VITAMIN D3) 125 MCG (5000 UT) CAPS Take 10,000 Units by mouth in the morning and at bedtime.    . finasteride (PROSCAR) 5 MG tablet Take 5 mg by mouth daily.    . fluticasone (FLONASE) 50 MCG/ACT nasal spray Place 1 spray into both nostrils daily.    Marland Kitchen KLOR-CON M20 20 MEQ tablet Take 20 mEq by mouth daily.    Marland Kitchen L-Lysine 1000 MG TABS Take 1,000 mg by mouth daily.     Marland Kitchen levothyroxine (SYNTHROID) 150 MCG tablet Take 150 mcg by mouth daily before breakfast.     . Magnesium 250 MG TABS Take 250 mg by mouth daily.    . meclizine (ANTIVERT) 25 MG tablet Take 25 mg by mouth 3 (three) times daily as needed.    . metoprolol tartrate (LOPRESSOR) 25 MG tablet Take 25 mg by mouth 2 (two) times daily.    . Multiple  Vitamin (MULTIVITAMIN) tablet Take 1 tablet by mouth daily.    Marland Kitchen olmesartan (BENICAR) 5 MG tablet Take 5 mg by mouth daily.    . Omega-3 Fatty Acids (FISH OIL PO) Take 3,200 mg by mouth daily. 1600 mg per cap    . sertraline (ZOLOFT) 100 MG tablet Take 100 mg by mouth daily.    . simvastatin (ZOCOR) 40 MG tablet Take 40 mg by mouth every evening.    . sodium bicarbonate 650 MG tablet Take 650  mg by mouth 3 (three) times daily.    Marland Kitchen torsemide (DEMADEX) 20 MG tablet Take 20 mg by mouth daily.    . Travoprost, BAK Free, (TRAVATAN) 0.004 % SOLN ophthalmic solution Place 1 drop into both eyes at bedtime.     No current facility-administered medications for this visit.    ALLERGIES:  No Known Allergies  PHYSICAL EXAM:  Performance status (ECOG): 1 - Symptomatic but completely ambulatory  Vitals:   02/14/20 1351  BP: (!) 189/75  Pulse: 74  Resp: 18  Temp: 97.7 F (36.5 C)  SpO2: 95%   Wt Readings from Last 3 Encounters:  02/07/20 273 lb (123.8 kg)  02/05/20 274 lb 11.2 oz (124.6 kg)  08/31/16 295 lb (133.8 kg)   Physical Exam Vitals reviewed.  Constitutional:      Appearance: Normal appearance. He is obese.  Cardiovascular:     Rate and Rhythm: Normal rate and regular rhythm.     Pulses: Normal pulses.     Heart sounds: Normal heart sounds.  Pulmonary:     Effort: Pulmonary effort is normal.     Breath sounds: Normal breath sounds.  Musculoskeletal:     Right lower leg: Edema (1+) present.     Left lower leg: Edema (1+) present.  Neurological:     General: No focal deficit present.     Mental Status: He is alert and oriented to person, place, and time.  Psychiatric:        Mood and Affect: Mood normal.        Behavior: Behavior normal.     LABORATORY DATA:  I have reviewed the labs as listed.  CBC Latest Ref Rng & Units 02/07/2020 02/05/2020 01/26/2019  WBC 4.0 - 10.5 K/uL 10.2 9.1 9.6  Hemoglobin 13.0 - 17.0 g/dL 12.3(L) 11.8(L) 13.8  Hematocrit 39 - 52 % 38.0(L)  37.3(L) 42  Platelets 150 - 400 K/uL 136(L) 162 182   CMP Latest Ref Rng & Units 02/05/2020 01/26/2019 07/15/2012  Glucose 70 - 99 mg/dL 104(H) - 100(H)  BUN 8 - 23 mg/dL 48(H) 42(A) 40(H)  Creatinine 0.61 - 1.24 mg/dL 3.63(H) 2.6(A) 1.51(H)  Sodium 135 - 145 mmol/L 140 142 140  Potassium 3.5 - 5.1 mmol/L 5.0 5.6(A) 4.4  Chloride 98 - 111 mmol/L 111 108 105  CO2 22 - 32 mmol/L 21(L) 24(A) 24  Calcium 8.9 - 10.3 mg/dL 8.7(L) 9.0 9.2  Total Protein 6.5 - 8.1 g/dL 6.7 - -  Total Bilirubin 0.3 - 1.2 mg/dL 0.4 - -  Alkaline Phos 38 - 126 U/L 69 68 -  AST 15 - 41 U/L 16 16 -  ALT 0 - 44 U/L 13 12 -      Component Value Date/Time   RBC 3.91 (L) 02/07/2020 0620   MCV 97.2 02/07/2020 0620   MCH 31.5 02/07/2020 0620   MCHC 32.4 02/07/2020 0620   RDW 14.7 02/07/2020 0620   LYMPHSABS 2.9 02/05/2020 1405   MONOABS 0.6 02/05/2020 1405   EOSABS 0.4 02/05/2020 1405   BASOSABS 0.1 02/05/2020 1405   Lab Results  Component Value Date   LDH 197 (H) 02/05/2020   Lab Results  Component Value Date   TOTALPROTELP 6.5 02/05/2020   Lab Results  Component Value Date   KPAFRELGTCHN 105.1 (H) 02/05/2020   LAMBDASER 37.3 (H) 02/05/2020   KAPLAMBRATIO 2.82 (H) 02/05/2020    DIAGNOSTIC IMAGING:  I have independently reviewed the scans and discussed with the patient. DG Bone Survey Met  Result Date: 02/05/2020 CLINICAL DATA:  Monoclonal gammopathy EXAM: METASTATIC BONE SURVEY COMPARISON:  None. FINDINGS: No focal lytic bone lesions are identified. Extensive calcifications are noted in the expected location of the right carotid bifurcation. Surgical clips likely related to left carotid endarterectomy are identified. Degenerative changes are noted within the midthoracic spine. Extensive calcifications noted within the abdominal aorta and pelvis bilaterally. Median sternotomy has been performed. Extensive calcifications are seen within the SFAs bilaterally. Bilateral total knee arthroplasty has been  performed. IMPRESSION: 1. No focal lytic bone lesions are identified. Electronically Signed   By: Fidela Salisbury MD   On: 02/05/2020 22:30   US BIOPSY (KIDNEY)  Result Date: 02/07/2020 INDICATION: Chronic kidney disease, proteinuria and monoclonal protein spike by electrophoresis. EXAM: ULTRASOUND GUIDED CORE BIOPSY OF LEFT KIDNEY MEDICATIONS: None. ANESTHESIA/SEDATION: Fentanyl 50 mcg IV; Versed 1.0 mg IV Moderate Sedation Time:  60 minutes. The patient was continuously monitored during the procedure by the interventional radiology nurse under my direct supervision. PROCEDURE: The procedure, risks, benefits, and alternatives were explained to the patient. Questions regarding the procedure were encouraged and answered. The patient understands and consents to the procedure. A time-out was performed prior to initiating the procedure. Sonographic imaging of both kidneys was performed in a prone position. The left flank region was prepped with chlorhexidine in a sterile fashion, and a sterile drape was applied covering the operative field. A sterile gown and sterile gloves were used for the procedure. Local anesthesia was provided with 1% Lidocaine. Under ultrasound guidance, a 15 gauge trocar needle was advanced to the margin of lower pole cortex of the left kidney. Two separate coaxial 16 gauge core biopsy samples were obtained of lower pole cortex. A slurry of Gel-Foam pledgets mixed in normal saline was then injected through the outer needle as the needle was retracted and removed. COMPLICATIONS: None immediate. FINDINGS: Both kidneys are visible by ultrasound. Both demonstrate cortical thinning and atrophy. The left renal cortex was slightly better visualized than the right. Solid and intact core biopsy samples were obtained from the left kidney. IMPRESSION: Ultrasound-guided core biopsy performed at the level of left lower pole renal cortex. Electronically Signed   By: Aletta Edouard M.D.   On: 02/07/2020  11:09     ASSESSMENT:  1.  IgA kappa MGUS: -Patient seen at the request of Dr. Theador Hawthorne. -Proteinuria work-up revealed urine immunofixation positive for IgA kappa monoclonal protein. -Labs on 02/05/2020 shows M spike 0.5 g.  Creatinine 3.6, calcium 8.7.  LDH 197.  Beta-2 microglobulin 7.2. -Free kappa light chains 105, lambda light chain 37.3, ratio of 2.82. -Skeletal survey on 02/05/2020 negative for lytic lesions. -Kidney biopsy was done on 02/07/2020.  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  No evidence of myeloma kidney.   2.  CKD: -Stage IV CKD since 2010, thought to be secondary to hypertension. -Ultrasound of the kidneys on 12/06/2019 showed increased echogenicity in both kidneys, likely from CKD.  Negative for hydronephrosis.  3.  Social and family history: -Quit smoking 5 years ago, smoked 3 packs/day for 52 years.  He has been vaping lately for the last 5 years. 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 -Both of his parents had lung cancer and were smokers.  Paternal uncle also had cancer, type unknown to the patient.   PLAN:  1.  IgA kappa MGUS: -We discussed the normal pathology and prognosis of MGUS. -Kidney  biopsy was not indicated of myeloma kidney.  Skeletal survey did not show any bone lesions.  Calcium was normal. -His worsening kidney function is secondary to arterionephrosclerosis. -I plan to follow him closely and check his labs in 3 months.  2.  Nephrotic range proteinuria: -24-hour urine by Dr. Theador Hawthorne showed proteinuria of 15 grams. -Kidney biopsy showed glomerulosclerosis.  3.  Hypertension: -Continue amlodipine and Lopressor.  Orders placed this encounter:  No orders of the defined types were placed in this encounter.    Derek Jack, MD [place] [phone]   I, Milinda Antis, am acting as a scribe for Dr. Sanda Linger.  I, Derek Jack MD, have reviewed the above documentation for accuracy and completeness, and I agree with the above.

## 2020-04-17 ENCOUNTER — Other Ambulatory Visit: Payer: Self-pay | Admitting: *Deleted

## 2020-04-17 DIAGNOSIS — N186 End stage renal disease: Secondary | ICD-10-CM

## 2020-04-17 DIAGNOSIS — N185 Chronic kidney disease, stage 5: Secondary | ICD-10-CM

## 2020-04-22 ENCOUNTER — Other Ambulatory Visit: Payer: Self-pay

## 2020-04-22 ENCOUNTER — Ambulatory Visit: Payer: Managed Care, Other (non HMO) | Admitting: Vascular Surgery

## 2020-04-22 ENCOUNTER — Ambulatory Visit (HOSPITAL_COMMUNITY)
Admission: RE | Admit: 2020-04-22 | Discharge: 2020-04-22 | Disposition: A | Discharge status: Home / Self Care | Payer: Managed Care, Other (non HMO) | Source: Ambulatory Visit | Attending: Vascular Surgery | Admitting: Vascular Surgery

## 2020-04-22 ENCOUNTER — Encounter: Payer: Self-pay | Admitting: Vascular Surgery

## 2020-04-22 VITALS — BP 156/80 | HR 54 | Temp 98.2°F | Resp 18 | Ht 70.0 in | Wt 263.0 lb

## 2020-04-22 DIAGNOSIS — N185 Chronic kidney disease, stage 5: Secondary | ICD-10-CM

## 2020-04-22 NOTE — Progress Notes (Signed)
[facility] [place]  Patient name: [name] MRN: [id number] DOB: August 23, 1945 Sex: male  REASON FOR CONSULT: Discussed access for hemodialysis  HPI: [name] is a 74 y.o. male, here today for discussion of hemodialysis access.  He is here today with his wife.  He has progressive renal insufficiency and is approaching need for dialysis.  He reports that he wishes to pursue peritoneal dialysis but has been recommended that he have hemodialysis access as a backup.  He has evaluation for peritoneal dialysis to be placed in [facility].  He has no history of pacemaker.  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  Also has a remote history of left carotid endarterectomy.  Past Medical History:  Diagnosis Date  . Aortic stenosis   . Arthritis   . Atherosclerotic heart disease of native coronary artery without angina pectoris   . CAD (coronary artery disease)   . Chronic obstructive pulmonary disease, unspecified ([place])   . COPD (chronic obstructive pulmonary disease) ([place])   . Depression   . Essential (primary) hypertension   . Gout, unspecified   . Heart murmur   . Hemorrhoid   . Hyperlipidemia, unspecified   . Hypertension   . Hypothyroidism   . Hypothyroidism, unspecified   . Kidney stones   . Major depressive disorder, single episode, unspecified   . Morbid (severe) obesity due to excess calories ([place])   . Nonrheumatic aortic (valve) stenosis   . Orthostatic hypotension   . Syncope   . Thyroid disease     Family History  Problem Relation Age of Onset  . Heart disease Mother   . Heart disease Father   . Hypertension Father     SOCIAL HISTORY: Social History   Socioeconomic History  . Marital status: Married    Spouse name: Not on file  . Number of children: 1  . Years of education: HS  . Highest education level: Not on file  Occupational History  . Occupation: Retired   Tobacco Use  . Smoking status:  Former Smoker    Packs/day: 3.00    Years: 50.00    Pack years: 150.00    Types: Cigarettes    Quit date: 12/13/2010    Years since quitting: 9.3  . Smokeless tobacco: Former Systems developer  . Tobacco comment: Electronic cigarette...USES INFREQUENTLY NOW  Vaping Use  . Vaping Use: Every day  . Start date: 02/01/2013  Substance and Sexual Activity  . Alcohol use: Yes    Comment: Rare  . Drug use: No  . Sexual activity: Not Currently  Other Topics Concern  . Not on file  Social History Narrative   Drinks about 3 cups of coffee a day, drinks about 2 sundrops a day    Social Determinants of Health   Financial Resource Strain:   . Difficulty of Paying Living Expenses: Not on file  Food Insecurity:   . Worried About Charity fundraiser in the Last Year: Not on file  . Ran Out of Food in the Last Year: Not on file  Transportation Needs:   . Lack of Transportation (Medical): Not on file  . Lack of Transportation (Non-Medical): Not on file  Physical Activity:   . Days of Exercise per Week: Not on file  . Minutes of Exercise per Session: Not on file  Stress:   . Feeling of Stress : Not on file  Social Connections:   . Frequency of  Communication with Friends and Family: Not on file  . Frequency of Social Gatherings with Friends and Family: Not on file  . Attends Religious Services: Not on file  . Active Member of Clubs or Organizations: Not on file  . Attends Archivist Meetings: Not on file  . Marital Status: Not on file  Intimate Partner Violence:   . Fear of Current or Ex-Partner: Not on file  . Emotionally Abused: Not on file  . Physically Abused: Not on file  . Sexually Abused: Not on file    No Known Allergies  Current Outpatient Medications  Medication Sig Dispense Refill  . albuterol (PROVENTIL HFA;VENTOLIN HFA) 108 (90 BASE) MCG/ACT inhaler Inhale 2 puffs into the lungs every 6 (six) hours as needed. For wheezing    . allopurinol (ZYLOPRIM) 300 MG tablet Take 300 mg  by mouth daily.    Marland Kitchen amLODipine (NORVASC) 5 MG tablet Take 5 mg by mouth daily.    Marland Kitchen aspirin 325 MG tablet Take 325 mg by mouth in the morning and at bedtime.     . calcitRIOL (ROCALTROL) 0.25 MCG capsule Take 0.25 mcg by mouth daily.    . Cholecalciferol (VITAMIN D3) 125 MCG (5000 UT) CAPS Take 10,000 Units by mouth in the morning and at bedtime.    . finasteride (PROSCAR) 5 MG tablet Take 5 mg by mouth daily.    . fluticasone (FLONASE) 50 MCG/ACT nasal spray Place 1 spray into both nostrils daily.    Marland Kitchen KLOR-CON M20 20 MEQ tablet Take 20 mEq by mouth daily.    Marland Kitchen L-Lysine 1000 MG TABS Take 1,000 mg by mouth daily.     Marland Kitchen levothyroxine (SYNTHROID) 150 MCG tablet Take 150 mcg by mouth daily before breakfast.     . Magnesium 250 MG TABS Take 250 mg by mouth daily.    . meclizine (ANTIVERT) 25 MG tablet Take 25 mg by mouth 3 (three) times daily as needed.    . metoprolol tartrate (LOPRESSOR) 25 MG tablet Take 25 mg by mouth 2 (two) times daily.    . Multiple Vitamin (MULTIVITAMIN) tablet Take 1 tablet by mouth daily.    . Omega-3 Fatty Acids (FISH OIL PO) Take 3,200 mg by mouth daily. 1600 mg per cap    . sertraline (ZOLOFT) 100 MG tablet Take 100 mg by mouth daily.    . simvastatin (ZOCOR) 40 MG tablet Take 40 mg by mouth every evening.    . sodium bicarbonate 650 MG tablet Take 650 mg by mouth 3 (three) times daily.    Marland Kitchen torsemide (DEMADEX) 20 MG tablet Take 20 mg by mouth daily.    . Travoprost, BAK Free, (TRAVATAN) 0.004 % SOLN ophthalmic solution Place 1 drop into both eyes at bedtime.     No current facility-administered medications for this visit.    REVIEW OF SYSTEMS:  [X]  denotes positive finding, [ ]  denotes negative finding Cardiac  Comments:  Chest pain or chest pressure:    Shortness of breath upon exertion:    Short of breath when lying flat:    Irregular heart rhythm:        Vascular    Pain in calf, thigh, or hip brought on by ambulation:    Pain in feet at night that  wakes you up from your sleep:     Blood clot in your veins:    Leg swelling:         Pulmonary    Oxygen at home:  Productive cough:     Wheezing:         Neurologic    Sudden weakness in arms or legs:     Sudden numbness in arms or legs:     Sudden onset of difficulty speaking or slurred speech:    Temporary loss of vision in one eye:     Problems with dizziness:         Gastrointestinal    Blood in stool:     Vomited blood:         Genitourinary    Burning when urinating:     Blood in urine:        Psychiatric    Major depression:         Hematologic    Bleeding problems:    Problems with blood clotting too easily:        Skin    Rashes or ulcers:        Constitutional    Fever or chills:      PHYSICAL EXAM: Vitals:   04/22/20 1302  BP: (!) 156/80  Pulse: (!) 54  Resp: 18  Temp: 98.2 F (36.8 C)  TempSrc: Other (Comment)  SpO2: 96%  Weight: 263 lb (119.3 kg)  Height: 5\' 10"  (1.778 m)    GENERAL: The patient is a well-nourished male, in no acute distress. The vital signs are documented above. CARDIAC: There is a regular rate and rhythm.  VASCULAR: 2+ radial pulses bilaterally.  Moderate surface pain at the left wrist PULMONARY: There is good air exchange  MUSCULOSKELETAL: There are no major deformities or cyanosis. NEUROLOGIC: No focal weakness or paresthesias are detected. SKIN: There are no ulcers or rashes noted. PSYCHIATRIC: The patient has a normal affect.  DATA:   Noninvasive venous studies and arterial studies of his upper extremities from [facility] from today were reviewed with the patient and his wife.  This does show a large cephalic vein bilaterally  MEDICAL ISSUES:  Had long discussion with the patient and his wife regarding options for hemodialysis.  I discussed temporary catheter for immediate hemodialysis which she does not need.  Also discussed options for AV fistula and AV graft.  I discussed the potential limitations of  each of these.  He does appear to be an excellent candidate for AV fistula creation.  He is right-handed and would recommend a left arm radiocephalic fistula as his initial hemodialysis access.  He understands and we will schedule this at his earliest convenience at [facility] [place] [facility] [place] phone [phone]

## 2020-04-26 ENCOUNTER — Other Ambulatory Visit: Payer: Self-pay | Admitting: *Deleted

## 2020-04-29 ENCOUNTER — Other Ambulatory Visit: Payer: Self-pay | Admitting: *Deleted

## 2020-04-29 DIAGNOSIS — N185 Chronic kidney disease, stage 5: Secondary | ICD-10-CM

## 2020-04-29 DIAGNOSIS — N186 End stage renal disease: Secondary | ICD-10-CM

## 2020-05-09 ENCOUNTER — Other Ambulatory Visit (HOSPITAL_COMMUNITY): Payer: Managed Care, Other (non HMO)

## 2020-05-16 ENCOUNTER — Ambulatory Visit (HOSPITAL_COMMUNITY): Payer: Managed Care, Other (non HMO) | Admitting: Hematology

## 2020-05-22 NOTE — Patient Instructions (Signed)
Your procedure is scheduled on: 05/30/2020  Report to [facility] at 8:00    AM.  Call this number if you have problems the morning of surgery: [phone]   Remember:   Do not Eat or Drink after midnight         No Smoking the morning of surgery  :  Take these medicines the morning of surgery with A SIP OF WATER: Amlodipine, allopurinol, finasteride, zoloft, hydralazine, levothyroxine, metoprolol and protonix   Do not wear jewelry, make-up or nail polish.  Do not wear lotions, powders, or perfumes. You may wear deodorant.  Do not shave 48 hours prior to surgery. Men may shave face and neck.  Do not bring valuables to the hospital.  Contacts, dentures or bridgework may not be worn into surgery.  Leave suitcase in the car. After surgery it may be brought to your room.  For patients admitted to the hospital, checkout time is 11:00 AM the day of discharge.   Patients discharged the day of surgery will not be allowed to drive home.    Special Instructions: Shower using CHG night before surgery and shower the day of surgery use CHG.  Use special wash - you have one bottle of CHG for all showers.  You should use approximately 1/2 of the bottle for each shower.  How to Use Chlorhexidine for Bathing Chlorhexidine gluconate (CHG) is a germ-killing (antiseptic) solution that is used to clean the skin. It can get rid of the bacteria that normally live on the skin and can keep them away for about 24 hours. To clean your skin with CHG, you may be given:  A CHG solution to use in the shower or as part of a sponge bath.  A prepackaged cloth that contains CHG. Cleaning your skin with CHG may help lower the risk for infection:  While you are staying in the intensive care unit of the hospital.  If you have a vascular access, such as a central line, to provide short-term or long-term access to your veins.  If you have a catheter to drain urine from your bladder.  If you are on a ventilator. A  ventilator is a machine that helps you breathe by moving air in and out of your lungs.  After surgery. What are the risks? Risks of using CHG include:  A skin reaction.  Hearing loss, if CHG gets in your ears.  Eye injury, if CHG gets in your eyes and is not rinsed out.  The CHG product catching fire. Make sure that you avoid smoking and flames after applying CHG to your skin. Do not use CHG:  If you have a chlorhexidine allergy or have previously reacted to chlorhexidine.  On babies younger than 41 months of age. How to use CHG solution  Use CHG only as told by your health care provider, and follow the instructions on the label.  Use the full amount of CHG as directed. Usually, this is one bottle. During a shower Follow these steps when using CHG solution during a shower (unless your health care provider gives you different instructions): 1. Start the shower. 2. Use your normal soap and shampoo to wash your face and hair. 3. Turn off the shower or move out of the shower stream. 4. Pour the CHG onto a clean washcloth. Do not use any type of brush or rough-edged sponge. 5. Starting at your neck, lather your body down to your toes. Make sure you follow these instructions: ? If  you will be having surgery, pay special attention to the part of your body where you will be having surgery. Scrub this area for at least 1 minute. ? Do not use CHG on your head or face. If the solution gets into your ears or eyes, rinse them well with water. ? Avoid your genital area. ? Avoid any areas of skin that have broken skin, cuts, or scrapes. ? Scrub your back and under your arms. Make sure to wash skin folds. 6. Let the lather sit on your skin for 1-2 minutes or as long as told by your health care provider. 7. Thoroughly rinse your entire body in the shower. Make sure that all body creases and crevices are rinsed well. 8. Dry off with a clean towel. Do not put any substances on your body  afterward--such as powder, lotion, or perfume--unless you are told to do so by your health care provider. Only use lotions that are recommended by the manufacturer. 9. Put on clean clothes or pajamas. 10. If it is the night before your surgery, sleep in clean sheets.  During a sponge bath Follow these steps when using CHG solution during a sponge bath (unless your health care provider gives you different instructions): 1. Use your normal soap and shampoo to wash your face and hair. 2. Pour the CHG onto a clean washcloth. 3. Starting at your neck, lather your body down to your toes. Make sure you follow these instructions: ? If you will be having surgery, pay special attention to the part of your body where you will be having surgery. Scrub this area for at least 1 minute. ? Do not use CHG on your head or face. If the solution gets into your ears or eyes, rinse them well with water. ? Avoid your genital area. ? Avoid any areas of skin that have broken skin, cuts, or scrapes. ? Scrub your back and under your arms. Make sure to wash skin folds. 4. Let the lather sit on your skin for 1-2 minutes or as long as told by your health care provider. 5. Using a different clean, wet washcloth, thoroughly rinse your entire body. Make sure that all body creases and crevices are rinsed well. 6. Dry off with a clean towel. Do not put any substances on your body afterward--such as powder, lotion, or perfume--unless you are told to do so by your health care provider. Only use lotions that are recommended by the manufacturer. 7. Put on clean clothes or pajamas. 8. If it is the night before your surgery, sleep in clean sheets. How to use CHG prepackaged cloths  Only use CHG cloths as told by your health care provider, and follow the instructions on the label.  Use the CHG cloth on clean, dry skin.  Do not use the CHG cloth on your head or face unless your health care provider tells you to.  When washing with  the CHG cloth: ? Avoid your genital area. ? Avoid any areas of skin that have broken skin, cuts, or scrapes. Before surgery Follow these steps when using a CHG cloth to clean before surgery (unless your health care provider gives you different instructions): 1. Using the CHG cloth, vigorously scrub the part of your body where you will be having surgery. Scrub using a back-and-forth motion for 3 minutes. The area on your body should be completely wet with CHG when you are done scrubbing. 2. Do not rinse. Discard the cloth and let the area air-dry. Do  not put any substances on the area afterward, such as powder, lotion, or perfume. 3. Put on clean clothes or pajamas. 4. If it is the night before your surgery, sleep in clean sheets.  For general bathing Follow these steps when using CHG cloths for general bathing (unless your health care provider gives you different instructions). 1. Use a separate CHG cloth for each area of your body. Make sure you wash between any folds of skin and between your fingers and toes. Wash your body in the following order, switching to a new cloth after each step: ? The front of your neck, shoulders, and chest. ? Both of your arms, under your arms, and your hands. ? Your stomach and groin area, avoiding the genitals. ? Your right leg and foot. ? Your left leg and foot. ? The back of your neck, your back, and your buttocks. 2. Do not rinse. Discard the cloth and let the area air-dry. Do not put any substances on your body afterward--such as powder, lotion, or perfume--unless you are told to do so by your health care provider. Only use lotions that are recommended by the manufacturer. 3. Put on clean clothes or pajamas. Contact a health care provider if:  Your skin gets irritated after scrubbing.  You have questions about using your solution or cloth. Get help right away if:  Your eyes become very red or swollen.  Your eyes itch badly.  Your skin itches badly  and is red or swollen.  Your hearing changes.  You have trouble seeing.  You have swelling or tingling in your mouth or throat.  You have trouble breathing.  You swallow any chlorhexidine. Summary  Chlorhexidine gluconate (CHG) is a germ-killing (antiseptic) solution that is used to clean the skin. Cleaning your skin with CHG may help to lower your risk for infection.  You may be given CHG to use for bathing. It may be in a bottle or in a prepackaged cloth to use on your skin. Carefully follow your health care provider's instructions and the instructions on the product label.  Do not use CHG if you have a chlorhexidine allergy.  Contact your health care provider if your skin gets irritated after scrubbing. This information is not intended to replace advice given to you by your health care provider. Make sure you discuss any questions you have with your health care provider. Document Revised: 09/01/2018 Document Reviewed: 05/13/2017 Elsevier Patient Education  [place] Fistula Placement  Arteriovenous (AV) fistula placement is a surgical procedure to create a connection between a blood vessel that carries blood away from your heart (artery) and a blood vessel that returns blood to your heart (vein). This connection is called a fistula. It is often made in the forearm or upper arm. You may need this procedure if you are getting hemodialysis treatments for kidney disease. An AV fistula makes your vein larger and stronger over several months. This makes the vein a safe and easy spot to insert the needles that are used for hemodialysis. Tell a health care provider about:  Any allergies you have.  All medicines you are taking, including vitamins, herbs, eye drops, creams, and over-the-counter medicines.  Any problems you or family members have had with anesthetic medicines.  Any blood disorders you have.  Any surgeries you have had.  Any medical conditions you have or  have had in the past. What are the risks? Generally, this is a safe procedure. However, problems may occur, including:  Infection.  Blood clot (thrombosis).  Reduced blood flow (stenosis).  Weakening or ballooning out of the fistula (aneurysm).  Bleeding.  Allergic reactions to medicines.  Nerve damage.  Swelling near the fistula (lymphedema).  Weakening of your heart (congestive heart failure).  Failure of the procedure. What happens before the procedure? Staying hydrated Follow instructions from your health care provider about hydration, which may include:  Up to 2 hours before the procedure - you may continue to drink clear liquids, such as water, clear fruit juice, black coffee, and plain tea.  Eating and drinking restrictions Follow instructions from your health care provider about eating and drinking, which may include:  8 hours before the procedure - stop eating heavy meals or foods, such as meat, fried foods, or fatty foods.  6 hours before the procedure - stop eating light meals or foods, such as toast or cereal.  6 hours before the procedure - stop drinking milk or drinks that contain milk.  2 hours before the procedure - stop drinking clear liquids. Medicines Ask your health care provider about:  Changing or stopping your regular medicines. This is especially important if you are taking diabetes medicines or blood thinners.  Taking medicines such as aspirin and ibuprofen. These medicines can thin your blood. Do not take these medicines unless your health care provider tells you to take them.  Taking over-the-counter medicines, vitamins, herbs, and supplements. General instructions  Imaging tests of your arm may be done to find the best place for the fistula.  Plan to have someone take you home from the hospital or clinic.  Ask your health care provider how your surgical site will be marked or identified.  Ask your health care provider what steps will  be taken to help prevent infection. These may include: ? Removing hair at the surgery site. ? Washing skin with a germ-killing soap. ? Taking antibiotic medicine.  Do not use any products that contain nicotine or tobacco for as long as possible before and after the procedure. These products include cigarettes, e-cigarettes, and chewing tobacco. If you need help quitting, ask your health care provider. What happens during the procedure?  An IV will be inserted into one of your veins.  You will be given one or more of the following: ? A medicine to help you relax (sedative). ? A medicine to numb the area (local anesthetic). ? A medicine to make you fall asleep (general anesthetic). ? A medicine that is injected into an area of your body to numb everything below the injection site (regional anesthetic).  The fistula site will be cleaned with a germ-killing solution (antiseptic).  An incision will be made on the inner side of your arm.  A vein and an artery will be opened and connected with stitches (sutures).  The incision will be closed with sutures or clips.  A bandage (dressing) will be placed over the area. The procedure may vary among health care providers and hospitals. What happens after the procedure?  Your blood pressure, heart rate, breathing rate, and blood oxygen level may be monitored until you leave the hospital or clinic.  Your fistula site will be checked for bleeding or swelling.  You will be given pain medicine as needed.  Do not drive for 24 hours if you were given a sedative during your procedure. Summary  Arteriovenous (AV) fistula placement is a surgical procedure to create a connection between a blood vessel that carries blood away from your heart (artery) and a  blood vessel that returns blood to your heart (vein). This connection is called a fistula.  Follow instructions from your health care provider about eating and drinking before the procedure.  Ask  your health care provider about changing or stopping your regular medicines before the procedure. This is especially important if you are taking diabetes medicines or blood thinners.  Do not drive for 24 hours if you were given a sedative during your procedure.  Plan to have someone take you home from the hospital or clinic. This information is not intended to replace advice given to you by your health care provider. Make sure you discuss any questions you have with your health care provider. Document Revised: 12/02/2018 Document Reviewed: 12/20/2017 Elsevier Patient Education  [place].  AV Fistula Placement, Care After This sheet gives you information about how to care for yourself after your procedure. Your health care provider may also give you more specific instructions. If you have problems or questions, contact your health care provider. What can I expect after the procedure? After the procedure, it is common to:  Feel sore.  Feel a vibration (thrill) over the fistula. Follow these instructions at home: Incision care      Follow instructions from your health care provider about how to take care of your incision. Make sure you: ? Wash your hands with soap and water before and after you change your bandage (dressing). If soap and water are not available, use hand sanitizer. ? Change your dressing as told by your health care provider. ? Leave stitches (sutures), skin glue, or adhesive strips in place. These skin closures may need to stay in place for 2 weeks or longer. If adhesive strip edges start to loosen and curl up, you may trim the loose edges. Do not remove adhesive strips completely unless your health care provider tells you to do that. Fistula care  Check your fistula site every day to make sure the thrill feels the same.  Check your fistula site every day for signs of infection. Check for: ? Redness, swelling, or pain. ? Fluid or blood. ? Warmth. ? Pus or a  bad smell.  Raise (elevate) the affected area above the level of your heart while you are sitting or lying down.  Do not lift anything that is heavier than 10 lb (4.5 kg), or the limit that you are told, until your health care provider says that it is safe.  Do not lie down on your fistula arm.  Do not let anyone draw blood or take a blood pressure reading on your fistula arm. This is important.  Do not wear tight jewelry or clothing over your fistula arm. Bathing  Do not take baths, swim, or use a hot tub until your health care provider approves. Ask your health care provider if you may take showers. You may only be allowed to take sponge baths.  Keep the area around your incision clean and dry. Medicines  Take over-the-counter and prescription medicines only as told by your health care provider.  Ask your health care provider if any medicine prescribed to you can cause constipation. You may need to take steps to prevent or treat constipation, such as: ? Drink enough fluid to keep your urine pale yellow. ? Take over-the-counter or prescription medicines. ? Eat foods that are high in fiber, such as beans, whole grains, and fresh fruits and vegetables. ? Limit foods that are high in fat and processed sugars, such as fried or sweet  foods. General instructions  Rest at home for a day or two.  Return to your normal activities as told by your health care provider. Ask your health care provider what activities are safe for you.  Keep all follow-up visits as told by your health care provider. This is important. Contact a health care provider if:  You have redness, swelling, or pain around your fistula site.  Your fistula site feels warm to the touch.  You have pus or a bad smell coming from your fistula site.  You have a fever.  You have numbness or coldness at your fistula site.  You feel a decrease or a change in the thrill. Get help right away if you:  Are bleeding from  your fistula site.  Have chest pain.  Have trouble breathing. Summary  Follow instructions from your health care provider about how to take care of your incision.  Do not let anyone draw blood or take a blood pressure reading on your fistula arm. This is important.  Return to your normal activities as told by your health care provider. Ask your health care provider what activities are safe for you.  Contact a health care provider if you have a change in the thrill or have any signs of infection at your fistula site.  Keep all follow-up visits as told by your health care provider. This is important. This information is not intended to replace advice given to you by your health care provider. Make sure you discuss any questions you have with your health care provider. Document Revised: 12/02/2018 Document Reviewed: 12/20/2017 Elsevier Patient Education  2020 [place] After These instructions provide you with information about caring for yourself after your procedure. Your health care provider may also give you more specific instructions. Your treatment has been planned according to current medical practices, but problems sometimes occur. Call your health care provider if you have any problems or questions after your procedure. What can I expect after the procedure? After your procedure, you may:  Feel sleepy for several hours.  Feel clumsy and have poor balance for several hours.  Feel forgetful about what happened after the procedure.  Have poor judgment for several hours.  Feel nauseous or vomit.  Have a sore throat if you had a breathing tube during the procedure. Follow these instructions at home: For at least 24 hours after the procedure:      Have a responsible adult stay with you. It is important to have someone help care for you until you are awake and alert.  Rest as needed.  Do not: ? Participate in activities in which you  could fall or become injured. ? Drive. ? Use heavy machinery. ? Drink alcohol. ? Take sleeping pills or medicines that cause drowsiness. ? Make important decisions or sign legal documents. ? Take care of children on your own. Eating and drinking  Follow the diet that is recommended by your health care provider.  If you vomit, drink water, juice, or soup when you can drink without vomiting.  Make sure you have little or no nausea before eating solid foods. General instructions  Take over-the-counter and prescription medicines only as told by your health care provider.  If you have sleep apnea, surgery and certain medicines can increase your risk for breathing problems. Follow instructions from your health care provider about wearing your sleep device: ? Anytime you are sleeping, including during daytime naps. ? While taking prescription pain medicines,  sleeping medicines, or medicines that make you drowsy.  If you smoke, do not smoke without supervision.  Keep all follow-up visits as told by your health care provider. This is important. Contact a health care provider if:  You keep feeling nauseous or you keep vomiting.  You feel light-headed.  You develop a rash.  You have a fever. Get help right away if:  You have trouble breathing. Summary  For several hours after your procedure, you may feel sleepy and have poor judgment.  Have a responsible adult stay with you for at least 24 hours or until you are awake and alert. This information is not intended to replace advice given to you by your health care provider. Make sure you discuss any questions you have with your health care provider. Document Revised: 09/13/2017 Document Reviewed: 10/06/2015 Elsevier Patient Education  [place].

## 2020-05-27 ENCOUNTER — Other Ambulatory Visit: Payer: Self-pay

## 2020-05-27 ENCOUNTER — Other Ambulatory Visit (HOSPITAL_COMMUNITY)
Admission: RE | Admit: 2020-05-27 | Discharge: 2020-05-27 | Disposition: A | Discharge status: Home / Self Care | Payer: Managed Care, Other (non HMO) | Source: Ambulatory Visit | Attending: Vascular Surgery | Admitting: Vascular Surgery

## 2020-05-27 ENCOUNTER — Encounter (HOSPITAL_COMMUNITY): Payer: Self-pay

## 2020-05-27 ENCOUNTER — Encounter (HOSPITAL_COMMUNITY)
Admission: RE | Admit: 2020-05-27 | Discharge: 2020-05-27 | Disposition: A | Discharge status: Home / Self Care | Payer: Managed Care, Other (non HMO) | Source: Ambulatory Visit | Attending: Vascular Surgery | Admitting: Vascular Surgery

## 2020-05-27 DIAGNOSIS — I10 Essential (primary) hypertension: Secondary | ICD-10-CM | POA: Diagnosis not present

## 2020-05-27 DIAGNOSIS — Z01818 Encounter for other preprocedural examination: Secondary | ICD-10-CM | POA: Insufficient documentation

## 2020-05-27 DIAGNOSIS — Z20822 Contact with and (suspected) exposure to covid-19: Secondary | ICD-10-CM | POA: Diagnosis not present

## 2020-05-27 LAB — CBC
HCT: 28.9 % — ABNORMAL LOW (ref 39.0–52.0)
Hemoglobin: 9.2 g/dL — ABNORMAL LOW (ref 13.0–17.0)
MCH: 31.7 pg (ref 26.0–34.0)
MCHC: 31.8 g/dL (ref 30.0–36.0)
MCV: 99.7 fL (ref 80.0–100.0)
Platelets: 194 10*3/uL (ref 150–400)
RBC: 2.9 MIL/uL — ABNORMAL LOW (ref 4.22–5.81)
RDW: 13.9 % (ref 11.5–15.5)
WBC: 9.8 10*3/uL (ref 4.0–10.5)
nRBC: 0 % (ref 0.0–0.2)

## 2020-05-27 LAB — COMPREHENSIVE METABOLIC PANEL
ALT: 12 U/L (ref 0–44)
AST: 15 U/L (ref 15–41)
Albumin: 3.1 g/dL — ABNORMAL LOW (ref 3.5–5.0)
Alkaline Phosphatase: 55 U/L (ref 38–126)
Anion gap: 10 (ref 5–15)
BUN: 75 mg/dL — ABNORMAL HIGH (ref 8–23)
CO2: 22 mmol/L (ref 22–32)
Calcium: 8.7 mg/dL — ABNORMAL LOW (ref 8.9–10.3)
Chloride: 108 mmol/L (ref 98–111)
Creatinine, Ser: 5.4 mg/dL — ABNORMAL HIGH (ref 0.61–1.24)
GFR, Estimated: 10 mL/min — ABNORMAL LOW (ref >60)
Glucose, Bld: 117 mg/dL — ABNORMAL HIGH (ref 70–99)
Potassium: 4.8 mmol/L (ref 3.5–5.1)
Sodium: 140 mmol/L (ref 135–145)
Total Bilirubin: 0.5 mg/dL (ref 0.3–1.2)
Total Protein: 6.6 g/dL (ref 6.5–8.1)

## 2020-05-28 LAB — SARS CORONAVIRUS 2 (TAT 6-24 HRS): SARS Coronavirus 2: NEGATIVE

## 2020-05-30 ENCOUNTER — Other Ambulatory Visit: Payer: Self-pay

## 2020-05-30 ENCOUNTER — Ambulatory Visit (HOSPITAL_COMMUNITY)
Admission: RE | Admit: 2020-05-30 | Discharge: 2020-05-30 | Disposition: A | Discharge status: Home / Self Care | Payer: Managed Care, Other (non HMO) | Attending: Vascular Surgery | Admitting: Vascular Surgery

## 2020-05-30 ENCOUNTER — Encounter (HOSPITAL_COMMUNITY): Payer: Self-pay | Admitting: Vascular Surgery

## 2020-05-30 ENCOUNTER — Ambulatory Visit (HOSPITAL_COMMUNITY): Payer: Managed Care, Other (non HMO) | Admitting: Certified Registered"

## 2020-05-30 ENCOUNTER — Encounter (HOSPITAL_COMMUNITY)
Admission: RE | Disposition: A | Discharge status: Home / Self Care | Payer: Self-pay | Source: Home / Self Care | Attending: Vascular Surgery

## 2020-05-30 DIAGNOSIS — Z87891 Personal history of nicotine dependence: Secondary | ICD-10-CM | POA: Insufficient documentation

## 2020-05-30 DIAGNOSIS — N185 Chronic kidney disease, stage 5: Secondary | ICD-10-CM

## 2020-05-30 DIAGNOSIS — N186 End stage renal disease: Secondary | ICD-10-CM | POA: Insufficient documentation

## 2020-05-30 DIAGNOSIS — Z7982 Long term (current) use of aspirin: Secondary | ICD-10-CM | POA: Insufficient documentation

## 2020-05-30 DIAGNOSIS — Z79899 Other long term (current) drug therapy: Secondary | ICD-10-CM | POA: Insufficient documentation

## 2020-05-30 DIAGNOSIS — N1832 Chronic kidney disease, stage 3b: Secondary | ICD-10-CM

## 2020-05-30 HISTORY — PX: AV FISTULA PLACEMENT: SHX1204

## 2020-05-30 SURGERY — ARTERIOVENOUS (AV) FISTULA CREATION
Anesthesia: General | Site: Arm Lower | Laterality: Left

## 2020-05-30 MED ORDER — LACTATED RINGERS IV SOLN: INTRAVENOUS | Status: DC

## 2020-05-30 MED ORDER — CHLORHEXIDINE GLUCONATE 0.12 % MT SOLN: 15.0000 mL | Freq: Once | OROMUCOSAL | Status: DC

## 2020-05-30 MED ORDER — VASOPRESSIN 20 UNIT/ML IV SOLN
INTRAVENOUS | Status: DC | PRN
  Administered 2020-05-30 (×3): 1 [IU] via INTRAVENOUS

## 2020-05-30 MED ORDER — CHLORHEXIDINE GLUCONATE 4 % EX LIQD: 60.0000 mL | Freq: Once | CUTANEOUS | Status: DC

## 2020-05-30 MED ORDER — CEFAZOLIN SODIUM-DEXTROSE 2-4 GM/100ML-% IV SOLN
INTRAVENOUS | Status: AC
  Filled 2020-05-30: qty 100

## 2020-05-30 MED ORDER — SODIUM CHLORIDE 0.9 % IV SOLN
INTRAVENOUS | Status: DC | PRN
  Administered 2020-05-30: 500 mL

## 2020-05-30 MED ORDER — FENTANYL CITRATE (PF) 100 MCG/2ML IJ SOLN: 25.0000 ug | INTRAMUSCULAR | Status: DC | PRN

## 2020-05-30 MED ORDER — MIDAZOLAM HCL 2 MG/2ML IJ SOLN
INTRAMUSCULAR | Status: AC
  Filled 2020-05-30: qty 2

## 2020-05-30 MED ORDER — FENTANYL CITRATE (PF) 100 MCG/2ML IJ SOLN
INTRAMUSCULAR | Status: DC | PRN
  Administered 2020-05-30 (×2): 25 ug via INTRAVENOUS

## 2020-05-30 MED ORDER — LIDOCAINE-EPINEPHRINE 0.5 %-1:200000 IJ SOLN
INTRAMUSCULAR | Status: DC | PRN
  Administered 2020-05-30: 10 mL

## 2020-05-30 MED ORDER — 0.9 % SODIUM CHLORIDE (POUR BTL) OPTIME
TOPICAL | Status: DC | PRN
  Administered 2020-05-30: 1000 mL

## 2020-05-30 MED ORDER — ORAL CARE MOUTH RINSE: 15.0000 mL | Freq: Once | OROMUCOSAL | Status: DC

## 2020-05-30 MED ORDER — CEFAZOLIN SODIUM-DEXTROSE 2-4 GM/100ML-% IV SOLN
2.0000 g | INTRAVENOUS | Status: AC
  Administered 2020-05-30: 2 g via INTRAVENOUS

## 2020-05-30 MED ORDER — HEPARIN SODIUM (PORCINE) 1000 UNIT/ML IJ SOLN
INTRAMUSCULAR | Status: AC
  Filled 2020-05-30: qty 6

## 2020-05-30 MED ORDER — MIDAZOLAM HCL 5 MG/5ML IJ SOLN
INTRAMUSCULAR | Status: DC | PRN
  Administered 2020-05-30: 1 mg via INTRAVENOUS

## 2020-05-30 MED ORDER — FENTANYL CITRATE (PF) 100 MCG/2ML IJ SOLN
INTRAMUSCULAR | Status: AC
  Filled 2020-05-30: qty 2

## 2020-05-30 MED ORDER — LIDOCAINE-EPINEPHRINE 0.5 %-1:200000 IJ SOLN
INTRAMUSCULAR | Status: AC
  Filled 2020-05-30: qty 1

## 2020-05-30 MED ORDER — OXYCODONE-ACETAMINOPHEN 5-325 MG PO TABS
1.0000 | ORAL_TABLET | Freq: Four times a day (QID) | ORAL | 0 refills | Status: DC | PRN
Start: 2020-05-30 — End: 2020-07-09

## 2020-05-30 MED ORDER — SODIUM CHLORIDE 0.9 % IV SOLN: INTRAVENOUS | Status: DC

## 2020-05-30 MED ORDER — PROPOFOL 500 MG/50ML IV EMUL
INTRAVENOUS | Status: DC | PRN
  Administered 2020-05-30: 50 ug/kg/min via INTRAVENOUS
  Administered 2020-05-30: 30 mg via INTRAVENOUS

## 2020-05-30 MED ORDER — PROPOFOL 10 MG/ML IV BOLUS
INTRAVENOUS | Status: AC
  Filled 2020-05-30: qty 40

## 2020-05-30 MED ORDER — CHLORHEXIDINE GLUCONATE 0.12 % MT SOLN
OROMUCOSAL | Status: AC
  Filled 2020-05-30: qty 60

## 2020-05-30 SURGICAL SUPPLY — 42 items
ADH SKN CLS APL DERMABOND .7 (GAUZE/BANDAGES/DRESSINGS) ×1
ARMBAND PINK RESTRICT EXTREMIT (MISCELLANEOUS) ×3 IMPLANT
BAG HAMPER (MISCELLANEOUS) ×3 IMPLANT
CANNULA VESSEL 3MM 2 BLNT TIP (CANNULA) ×3 IMPLANT
CLIP LIGATING EXTRA MED SLVR (CLIP) ×3 IMPLANT
CLIP LIGATING EXTRA SM BLUE (MISCELLANEOUS) ×3 IMPLANT
COVER LIGHT HANDLE STERIS (MISCELLANEOUS) ×6 IMPLANT
COVER MAYO STAND XLG (MISCELLANEOUS) ×3 IMPLANT
COVER PROBE W GEL 5X96 (DRAPES) ×2 IMPLANT
COVER WAND RF STERILE (DRAPES) ×3 IMPLANT
DECANTER SPIKE VIAL GLASS SM (MISCELLANEOUS) ×3 IMPLANT
DERMABOND ADVANCED (GAUZE/BANDAGES/DRESSINGS) ×2
DERMABOND ADVANCED .7 DNX12 (GAUZE/BANDAGES/DRESSINGS) ×1 IMPLANT
ELECT REM PT RETURN 9FT ADLT (ELECTROSURGICAL) ×3
ELECTRODE REM PT RTRN 9FT ADLT (ELECTROSURGICAL) ×1 IMPLANT
GAUZE SPONGE 4X4 12PLY STRL (GAUZE/BANDAGES/DRESSINGS) ×3 IMPLANT
GLOVE BIOGEL PI IND STRL 7.0 (GLOVE) ×3 IMPLANT
GLOVE BIOGEL PI INDICATOR 7.0 (GLOVE) ×6
GLOVE SS BIOGEL STRL SZ 7.5 (GLOVE) ×1 IMPLANT
GLOVE SUPERSENSE BIOGEL SZ 7.5 (GLOVE) ×2
GLOVE SURG SS PI 6.5 STRL IVOR (GLOVE) ×3 IMPLANT
GOWN STRL REUS W/TWL LRG LVL3 (GOWN DISPOSABLE) ×9 IMPLANT
IV NS 500ML (IV SOLUTION) ×3
IV NS 500ML BAXH (IV SOLUTION) ×1 IMPLANT
KIT BLADEGUARD II DBL (SET/KITS/TRAYS/PACK) ×3 IMPLANT
KIT TURNOVER KIT A (KITS) ×3 IMPLANT
MANIFOLD NEPTUNE II (INSTRUMENTS) ×3 IMPLANT
MARKER SKIN DUAL TIP RULER LAB (MISCELLANEOUS) ×6 IMPLANT
NDL HYPO 18GX1.5 BLUNT FILL (NEEDLE) ×1 IMPLANT
NEEDLE HYPO 18GX1.5 BLUNT FILL (NEEDLE) ×3 IMPLANT
NS IRRIG 1000ML POUR BTL (IV SOLUTION) ×3 IMPLANT
PACK CV ACCESS (CUSTOM PROCEDURE TRAY) ×3 IMPLANT
PAD ARMBOARD 7.5X6 YLW CONV (MISCELLANEOUS) ×6 IMPLANT
SET BASIN LINEN APH (SET/KITS/TRAYS/PACK) ×3 IMPLANT
SOL PREP POV-IOD 4OZ 10% (MISCELLANEOUS) ×3 IMPLANT
SOL PREP PROV IODINE SCRUB 4OZ (MISCELLANEOUS) ×3 IMPLANT
SUT PROLENE 6 0 CC (SUTURE) ×3 IMPLANT
SUT SILK 2 0 SH (SUTURE) IMPLANT
SUT VIC AB 3-0 SH 27 (SUTURE) ×3
SUT VIC AB 3-0 SH 27X BRD (SUTURE) ×1 IMPLANT
SYR 10ML LL (SYRINGE) ×3 IMPLANT
UNDERPAD 30X36 HEAVY ABSORB (UNDERPADS AND DIAPERS) ×3 IMPLANT

## 2020-05-30 NOTE — Discharge Instructions (Signed)
[facility]  Discharge Instructions  AV Fistula or Graft Surgery for Dialysis Access  Please refer to the following instructions for your post-procedure care. Your surgeon or physician assistant will discuss any changes with you.  Activity  You may drive the day following your surgery, if you are comfortable and no longer taking prescription pain medication. Resume full activity as the soreness in your incision resolves.  Bathing/Showering  You may shower after you go home. Keep your incision dry for 48 hours. Do not soak in a bathtub, hot tub, or swim until the incision heals completely. You may not shower if you have a hemodialysis catheter.  Incision Care  Clean your incision with mild soap and water after 48 hours. Pat the area dry with a clean towel. You do not need a bandage unless otherwise instructed. Do not apply any ointments or creams to your incision. You may have skin glue on your incision. Do not peel it off. It will come off on its own in about one week. Your arm may swell a bit after surgery. To reduce swelling use pillows to elevate your arm so it is above your heart. Your doctor will tell you if you need to lightly wrap your arm with an ACE bandage.  Diet  Resume your normal diet. There are not special food restrictions following this procedure. In order to heal from your surgery, it is CRITICAL to get adequate nutrition. Your body requires vitamins, minerals, and protein. Vegetables are the best source of vitamins and minerals. Vegetables also provide the perfect balance of protein. Processed food has little nutritional value, so try to avoid this.  Medications  Resume taking all of your medications. If your incision is causing pain, you may take over-the counter pain relievers such as acetaminophen (Tylenol). If you were prescribed a stronger pain medication, please be aware these medications can cause nausea and constipation. Prevent  nausea by taking the medication with a snack or meal. Avoid constipation by drinking plenty of fluids and eating foods with high amount of fiber, such as fruits, vegetables, and grains.  Do not take Tylenol if you are taking prescription pain medications.  Follow up Your surgeon may want to see you in the office following your access surgery. If so, this will be arranged at the time of your surgery.  Please call us immediately for any of the following conditions:  . Increased pain, redness, drainage (pus) from your incision site . Fever of 101 degrees or higher . Severe or worsening pain at your incision site . Hand pain or numbness. .  Reduce your risk of vascular disease:  . Stop smoking. If you would like help, call [facility] at 1-800-QUIT-NOW (365)190-7005) or [place] at (564)033-6792  . Manage your cholesterol . Maintain a desired weight . Control your diabetes . Keep your blood pressure down  Dialysis  It will take several weeks to several months for your new dialysis access to be ready for use. Your surgeon will determine when it is okay to use it. Your nephrologist will continue to direct your dialysis. You can continue to use your Permcath until your new access is ready for use.   05/30/2020 [name] [id number] 1945-08-10  Surgeon(s): Early, [name], MD  Procedure(s): ARTERIOVENOUS (AV) FISTULA CREATION LEFT   May stick graft immediately   May stick graft on designated area only:    Do not stick fistula for 12 weeks    If you  have any questions, please call the office at [phone].    Monitored Anesthesia Care, Care After These instructions provide you with information about caring for yourself after your procedure. Your health care provider may also give you more specific instructions. Your treatment has been planned according to current medical practices, but problems sometimes occur. Call your health care provider if you have any problems or  questions after your procedure. What can I expect after the procedure? After your procedure, you may:  Feel sleepy for several hours.  Feel clumsy and have poor balance for several hours.  Feel forgetful about what happened after the procedure.  Have poor judgment for several hours.  Feel nauseous or vomit.  Have a sore throat if you had a breathing tube during the procedure. Follow these instructions at home: For at least 24 hours after the procedure:      Have a responsible adult stay with you. It is important to have someone help care for you until you are awake and alert.  Rest as needed.  Do not: ? Participate in activities in which you could fall or become injured. ? Drive. ? Use heavy machinery. ? Drink alcohol. ? Take sleeping pills or medicines that cause drowsiness. ? Make important decisions or sign legal documents. ? Take care of children on your own. Eating and drinking  Follow the diet that is recommended by your health care provider.  If you vomit, drink water, juice, or soup when you can drink without vomiting.  Make sure you have little or no nausea before eating solid foods. General instructions  Take over-the-counter and prescription medicines only as told by your health care provider.  If you have sleep apnea, surgery and certain medicines can increase your risk for breathing problems. Follow instructions from your health care provider about wearing your sleep device: ? Anytime you are sleeping, including during daytime naps. ? While taking prescription pain medicines, sleeping medicines, or medicines that make you drowsy.  If you smoke, do not smoke without supervision.  Keep all follow-up visits as told by your health care provider. This is important. Contact a health care provider if:  You keep feeling nauseous or you keep vomiting.  You feel light-headed.  You develop a rash.  You have a fever. Get help right away if:  You have  trouble breathing. Summary  For several hours after your procedure, you may feel sleepy and have poor judgment.  Have a responsible adult stay with you for at least 24 hours or until you are awake and alert. This information is not intended to replace advice given to you by your health care provider. Make sure you discuss any questions you have with your health care provider. Document Revised: 09/13/2017 Document Reviewed: 10/06/2015 Elsevier Patient Education  [place].

## 2020-05-30 NOTE — Transfer of Care (Signed)
Immediate Anesthesia Transfer of Care Note  Patient: [name]  Procedure(s) Performed: ARTERIOVENOUS (AV) FISTULA CREATION LEFT (Left Arm Lower)  Patient Location: PACU  Anesthesia Type:MAC  Level of Consciousness: awake, alert , oriented and patient cooperative  Airway & Oxygen Therapy: Patient Spontanous Breathing and Patient connected to nasal cannula oxygen  Post-op Assessment: Report given to RN, Post -op Vital signs reviewed and stable and Patient moving all extremities  Post vital signs: Reviewed and stable  Last Vitals:  Vitals Value Taken Time  BP    Temp    Pulse    Resp 15 05/30/20 1048  SpO2    Vitals shown include unvalidated device data.  Last Pain:  Vitals:   05/30/20 0830  TempSrc: Oral  PainSc: 3       Patients Stated Pain Goal: 7 (60/15/61 5379)  Complications: No complications documented.

## 2020-05-30 NOTE — H&P (Signed)
Office Visit 04/22/2020 [facility]   [name], [name], MD Vascular Surgery  Stage 5 chronic kidney disease [facility]) Dx  New Patient (Initial Visit); Referred by Celene Squibb, MD Reason for Visit  Additional Documentation  Vitals:  BP 156/80Important   Pulse 54Important   Temp 98.2 F (36.8 C) (Other (Comment))  Resp 18  Ht 5\' 10"  (1.778 m)  Wt 119.3 kg  SpO2 96%  BMI 37.74 kg/m  BSA 2.43 m    More Vitals  Flowsheets:  Infectious Disease Screening,  MEWS Score,  Anthropometrics,  NEWS,  Vital Signs,  Method of Visit    Encounter Info:  Billing Info,  History,  Allergies,  Detailed Report    All Notes  Progress Notes by [name], MD at 04/22/2020 1:00 PM Author: [name], MD Author Type: Physician Filed: 04/22/2020 5:17 PM  Note Status: Signed Cosign: Cosign Not Required Encounter Date: 04/22/2020  Editor: [name], MD (Physician)                  [facility] [place]  Patient name: [name] MRN: [id number]        DOB: 1946-06-08          Sex: male  REASON FOR CONSULT: Discussed access for hemodialysis  HPI: [name] is a 74 y.o. male, here today for discussion of hemodialysis access.  He is here today with his wife.  He has progressive renal insufficiency and is approaching need for dialysis.  He reports that he wishes to pursue peritoneal dialysis but has been recommended that he have hemodialysis access as a backup.  He has evaluation for peritoneal dialysis to be placed in [facility].  He has no history of pacemaker.  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  Also has a remote history of left carotid endarterectomy.      Past Medical History:  Diagnosis Date  . Aortic stenosis   . Arthritis   . Atherosclerotic heart disease of native coronary artery without angina pectoris   . CAD (coronary artery disease)   . Chronic obstructive pulmonary  disease, unspecified ([place])   . COPD (chronic obstructive pulmonary disease) ([place])   . Depression   . Essential (primary) hypertension   . Gout, unspecified   . Heart murmur   . Hemorrhoid   . Hyperlipidemia, unspecified   . Hypertension   . Hypothyroidism   . Hypothyroidism, unspecified   . Kidney stones   . Major depressive disorder, single episode, unspecified   . Morbid (severe) obesity due to excess calories ([place])   . Nonrheumatic aortic (valve) stenosis   . Orthostatic hypotension   . Syncope   . Thyroid disease          Family History  Problem Relation Age of Onset  . Heart disease Mother   . Heart disease Father   . Hypertension Father     SOCIAL HISTORY: Social History        Socioeconomic History  . Marital status: Married    Spouse name: Not on file  . Number of children: 1  . Years of education: HS  . Highest education level: Not on file  Occupational History  . Occupation: Retired   Tobacco Use  . Smoking status: Former Smoker    Packs/day: 3.00    Years: 50.00    Pack years: 150.00    Types: Cigarettes    Quit date:  12/13/2010    Years since quitting: 9.3  . Smokeless tobacco: Former Systems developer  . Tobacco comment: Electronic cigarette...USES INFREQUENTLY NOW  Vaping Use  . Vaping Use: Every day  . Start date: 02/01/2013  Substance and Sexual Activity  . Alcohol use: Yes    Comment: Rare  . Drug use: No  . Sexual activity: Not Currently  Other Topics Concern  . Not on file  Social History Narrative   Drinks about 3 cups of coffee a day, drinks about 2 sundrops a day    Social Determinants of Health      Financial Resource Strain:   . Difficulty of Paying Living Expenses: Not on file  Food Insecurity:   . Worried About Charity fundraiser in the Last Year: Not on file  . Ran Out of Food in the Last Year: Not on file  Transportation Needs:   . Lack of Transportation (Medical): Not on file  . Lack of  Transportation (Non-Medical): Not on file  Physical Activity:   . Days of Exercise per Week: Not on file  . Minutes of Exercise per Session: Not on file  Stress:   . Feeling of Stress : Not on file  Social Connections:   . Frequency of Communication with Friends and Family: Not on file  . Frequency of Social Gatherings with Friends and Family: Not on file  . Attends Religious Services: Not on file  . Active Member of Clubs or Organizations: Not on file  . Attends Archivist Meetings: Not on file  . Marital Status: Not on file  Intimate Partner Violence:   . Fear of Current or Ex-Partner: Not on file  . Emotionally Abused: Not on file  . Physically Abused: Not on file  . Sexually Abused: Not on file    No Known Allergies        Current Outpatient Medications  Medication Sig Dispense Refill  . albuterol (PROVENTIL HFA;VENTOLIN HFA) 108 (90 BASE) MCG/ACT inhaler Inhale 2 puffs into the lungs every 6 (six) hours as needed. For wheezing    . allopurinol (ZYLOPRIM) 300 MG tablet Take 300 mg by mouth daily.    Marland Kitchen amLODipine (NORVASC) 5 MG tablet Take 5 mg by mouth daily.    Marland Kitchen aspirin 325 MG tablet Take 325 mg by mouth in the morning and at bedtime.     . calcitRIOL (ROCALTROL) 0.25 MCG capsule Take 0.25 mcg by mouth daily.    . Cholecalciferol (VITAMIN D3) 125 MCG (5000 UT) CAPS Take 10,000 Units by mouth in the morning and at bedtime.    . finasteride (PROSCAR) 5 MG tablet Take 5 mg by mouth daily.    . fluticasone (FLONASE) 50 MCG/ACT nasal spray Place 1 spray into both nostrils daily.    Marland Kitchen KLOR-CON M20 20 MEQ tablet Take 20 mEq by mouth daily.    Marland Kitchen L-Lysine 1000 MG TABS Take 1,000 mg by mouth daily.     Marland Kitchen levothyroxine (SYNTHROID) 150 MCG tablet Take 150 mcg by mouth daily before breakfast.     . Magnesium 250 MG TABS Take 250 mg by mouth daily.    . meclizine (ANTIVERT) 25 MG tablet Take 25 mg by mouth 3 (three) times daily as needed.    .  metoprolol tartrate (LOPRESSOR) 25 MG tablet Take 25 mg by mouth 2 (two) times daily.    . Multiple Vitamin (MULTIVITAMIN) tablet Take 1 tablet by mouth daily.    . Omega-3 Fatty Acids (FISH OIL  PO) Take 3,200 mg by mouth daily. 1600 mg per cap    . sertraline (ZOLOFT) 100 MG tablet Take 100 mg by mouth daily.    . simvastatin (ZOCOR) 40 MG tablet Take 40 mg by mouth every evening.    . sodium bicarbonate 650 MG tablet Take 650 mg by mouth 3 (three) times daily.    Marland Kitchen torsemide (DEMADEX) 20 MG tablet Take 20 mg by mouth daily.    . Travoprost, BAK Free, (TRAVATAN) 0.004 % SOLN ophthalmic solution Place 1 drop into both eyes at bedtime.     No current facility-administered medications for this visit.    REVIEW OF SYSTEMS:  [X]  denotes positive finding, [ ]  denotes negative finding Cardiac  Comments:  Chest pain or chest pressure:    Shortness of breath upon exertion:    Short of breath when lying flat:    Irregular heart rhythm:        Vascular    Pain in calf, thigh, or hip brought on by ambulation:    Pain in feet at night that wakes you up from your sleep:     Blood clot in your veins:    Leg swelling:         Pulmonary    Oxygen at home:    Productive cough:     Wheezing:         Neurologic    Sudden weakness in arms or legs:     Sudden numbness in arms or legs:     Sudden onset of difficulty speaking or slurred speech:    Temporary loss of vision in one eye:     Problems with dizziness:         Gastrointestinal    Blood in stool:     Vomited blood:         Genitourinary    Burning when urinating:     Blood in urine:        Psychiatric    Major depression:         Hematologic    Bleeding problems:    Problems with blood clotting too easily:        Skin    Rashes or ulcers:        Constitutional    Fever or chills:      PHYSICAL EXAM:     Vitals:   04/22/20 1302  BP: (!) 156/80  Pulse: (!) 54  Resp: 18  Temp: 98.2 F (36.8 C)  TempSrc: Other (Comment)  SpO2: 96%  Weight: 263 lb (119.3 kg)  Height: 5\' 10"  (1.778 m)    GENERAL: The patient is a well-nourished male, in no acute distress. The vital signs are documented above. CARDIAC: There is a regular rate and rhythm.  VASCULAR: 2+ radial pulses bilaterally.  Moderate surface pain at the left wrist PULMONARY: There is good air exchange  MUSCULOSKELETAL: There are no major deformities or cyanosis. NEUROLOGIC: No focal weakness or paresthesias are detected. SKIN: There are no ulcers or rashes noted. PSYCHIATRIC: The patient has a normal affect.  DATA:   Noninvasive venous studies and arterial studies of his upper extremities from [facility] from today were reviewed with the patient and his wife.  This does show a large cephalic vein bilaterally  MEDICAL ISSUES:  Had long discussion with the patient and his wife regarding options for hemodialysis.  I discussed temporary catheter for immediate hemodialysis which she does not need.  Also discussed options for AV fistula  and AV graft.  I discussed the potential limitations of each of these.  He does appear to be an excellent candidate for AV fistula creation.  He is right-handed and would recommend a left arm radiocephalic fistula as his initial hemodialysis access.  He understands and we will schedule this at his earliest convenience at [facility] Lauryl Seyer [facility] [place] phone [phone]     Addendum:  The patient has been re-examined and re-evaluated.  The patient's history and physical has been reviewed and is unchanged.    KENTRAVIOUS LIPFORD is a 74 y.o. male is being admitted with esrd. All the risks, benefits and other treatment options have been discussed with the patient. The patient has consented to proceed with Procedure(s): ARTERIOVENOUS  (AV) FISTULA CREATION LEFT as a surgical intervention.  Kaan Tosh 05/30/2020 9:11 AM Vascular and Vein Surgery

## 2020-05-30 NOTE — Anesthesia Postprocedure Evaluation (Signed)
Anesthesia Post Note  Patient: [name]  Procedure(s) Performed: ARTERIOVENOUS (AV) FISTULA CREATION LEFT (Left Arm Lower)  Patient location during evaluation: PACU Anesthesia Type: MAC Level of consciousness: awake, oriented, awake and alert and patient cooperative Pain management: pain level controlled Vital Signs Assessment: post-procedure vital signs reviewed and stable Respiratory status: spontaneous breathing, respiratory function stable and nonlabored ventilation Cardiovascular status: blood pressure returned to baseline and stable Postop Assessment: no headache and no backache Anesthetic complications: no   No complications documented.   Last Vitals:  Vitals:   05/30/20 0830  BP: 135/80  Pulse: 70  Resp: 15  Temp: 37 C  SpO2: 95%    Last Pain:  Vitals:   05/30/20 0830  TempSrc: Oral  PainSc: 3                  Tacy Learn

## 2020-05-30 NOTE — Op Note (Signed)
    OPERATIVE REPORT  DATE OF SURGERY: 05/30/2020  PATIENT: [name], 74 y.o. male MRN: [id number]  DOB: [birth year]  PRE-OPERATIVE DIAGNOSIS: Chronic renal insufficiency  POST-OPERATIVE DIAGNOSIS:  Same  PROCEDURE: Left radiocephalic AV fistula creation  SURGEON:  Curt Jews, M.D.  PHYSICIAN ASSISTANT: Nurse  The assistant was needed for exposure and to expedite the case  ANESTHESIA: Local with sedation  EBL: per anesthesia record  Total I/O In: 300 [I.V.:300] Out: -   BLOOD ADMINISTERED: none  DRAINS: none  SPECIMEN: none  COUNTS CORRECT:  YES  PATIENT DISPOSITION:  PACU - hemodynamically stable  PROCEDURE DETAILS: The patient was taken to room placed position with area of the left arm prepped draped you sterile fashion.  Incision was made from the level of the cephalic vein and the radial artery at the wrist.  The patient had a large caliber cephalic vein.  The vein had tributary branches ligated and divided.  The vein was mobilized to the level of the radial artery.  The radial artery was occluded proximally distally and was opened with 11 blade sent longstanding with Potts scissors.  The vein was cut to the appropriate length and the spatulated and sewn end-to-side to the artery with a running 6-0 Prolene suture.  Prior to completion of the anastomosis the 2 dilator passed easily through the proximal and distal anastomosis.  The anastomosis completed and clamps were removed with excellent flow through the vein.  There was a large side branch just above the arteriovenous anastomosis and this was clipped with ligaclips.  There was also a large branch in the mid forearm.  This is seen by preoperative sono site.  Incision was made over this large branch and this was ligated with 2-0 silk tie.  The wounds were irrigated with saline.  Hemostasis was obtained electrocautery.  The wounds were closed with 3-0 Vicryl in the subcutaneous and subcuticular tissue.  Sterile dressing  was applied and the patient was transferred to the recovery room in stable condition   [name], M.D., [facility] 05/30/2020 10:44 AM

## 2020-05-30 NOTE — Anesthesia Preprocedure Evaluation (Signed)
Anesthesia Evaluation  Patient identified by MRN, date of birth, ID band Patient awake    Reviewed: Allergy & Precautions, H&P , NPO status , Patient's Chart, lab work & pertinent test results, reviewed documented beta blocker date and time   Airway Mallampati: II  TM Distance: >3 FB Neck ROM: full    Dental no notable dental hx.    Pulmonary COPD, former smoker,    Pulmonary exam normal breath sounds clear to auscultation       Cardiovascular Exercise Tolerance: Good hypertension, + CAD  + Valvular Problems/Murmurs (AS - repaired)  Rhythm:regular Rate:Normal     Neuro/Psych PSYCHIATRIC DISORDERS Depression negative neurological ROS     GI/Hepatic negative GI ROS, Neg liver ROS,   Endo/Other  Hypothyroidism   Renal/GU ESRFRenal disease  negative genitourinary   Musculoskeletal   Abdominal   Peds  Hematology negative hematology ROS (+)   Anesthesia Other Findings   Reproductive/Obstetrics negative OB ROS                             Anesthesia Physical Anesthesia Plan  ASA: III  Anesthesia Plan: General   Post-op Pain Management:    Induction:   PONV Risk Score and Plan: Ondansetron  Airway Management Planned:   Additional Equipment:   Intra-op Plan:   Post-operative Plan:   Informed Consent: I have reviewed the patients History and Physical, chart, labs and discussed the procedure including the risks, benefits and alternatives for the proposed anesthesia with the patient or authorized representative who has indicated his/her understanding and acceptance.     Dental Advisory Given  Plan Discussed with: CRNA  Anesthesia Plan Comments:         Anesthesia Quick Evaluation

## 2020-05-31 ENCOUNTER — Encounter (HOSPITAL_COMMUNITY): Payer: Self-pay | Admitting: Vascular Surgery

## 2020-06-08 ENCOUNTER — Inpatient Hospital Stay (HOSPITAL_COMMUNITY)
Admission: EM | Admit: 2020-06-08 | Discharge: 2020-06-14 | DRG: 871 | Disposition: A | Discharge status: Home Health | Payer: Managed Care, Other (non HMO) | Attending: Internal Medicine | Admitting: Internal Medicine

## 2020-06-08 ENCOUNTER — Encounter (HOSPITAL_COMMUNITY): Payer: Self-pay | Admitting: *Deleted

## 2020-06-08 ENCOUNTER — Emergency Department (HOSPITAL_COMMUNITY): Payer: Managed Care, Other (non HMO)

## 2020-06-08 ENCOUNTER — Other Ambulatory Visit: Payer: Self-pay

## 2020-06-08 DIAGNOSIS — I2489 Other forms of acute ischemic heart disease: Secondary | ICD-10-CM

## 2020-06-08 DIAGNOSIS — I5043 Acute on chronic combined systolic (congestive) and diastolic (congestive) heart failure: Secondary | ICD-10-CM | POA: Diagnosis not present

## 2020-06-08 DIAGNOSIS — I132 Hypertensive heart and chronic kidney disease with heart failure and with stage 5 chronic kidney disease, or end stage renal disease: Secondary | ICD-10-CM | POA: Diagnosis present

## 2020-06-08 DIAGNOSIS — E872 Acidosis: Secondary | ICD-10-CM | POA: Diagnosis present

## 2020-06-08 DIAGNOSIS — I38 Endocarditis, valve unspecified: Secondary | ICD-10-CM | POA: Diagnosis present

## 2020-06-08 DIAGNOSIS — R7881 Bacteremia: Secondary | ICD-10-CM | POA: Diagnosis not present

## 2020-06-08 DIAGNOSIS — R7989 Other specified abnormal findings of blood chemistry: Secondary | ICD-10-CM

## 2020-06-08 DIAGNOSIS — B952 Enterococcus as the cause of diseases classified elsewhere: Secondary | ICD-10-CM | POA: Diagnosis not present

## 2020-06-08 DIAGNOSIS — Z95818 Presence of other cardiac implants and grafts: Secondary | ICD-10-CM | POA: Diagnosis not present

## 2020-06-08 DIAGNOSIS — I10 Essential (primary) hypertension: Secondary | ICD-10-CM | POA: Diagnosis not present

## 2020-06-08 DIAGNOSIS — I509 Heart failure, unspecified: Secondary | ICD-10-CM | POA: Diagnosis not present

## 2020-06-08 DIAGNOSIS — I120 Hypertensive chronic kidney disease with stage 5 chronic kidney disease or end stage renal disease: Secondary | ICD-10-CM | POA: Diagnosis not present

## 2020-06-08 DIAGNOSIS — E039 Hypothyroidism, unspecified: Secondary | ICD-10-CM | POA: Diagnosis present

## 2020-06-08 DIAGNOSIS — Z87891 Personal history of nicotine dependence: Secondary | ICD-10-CM | POA: Diagnosis not present

## 2020-06-08 DIAGNOSIS — J9601 Acute respiratory failure with hypoxia: Secondary | ICD-10-CM | POA: Diagnosis present

## 2020-06-08 DIAGNOSIS — I5033 Acute on chronic diastolic (congestive) heart failure: Secondary | ICD-10-CM | POA: Diagnosis present

## 2020-06-08 DIAGNOSIS — N185 Chronic kidney disease, stage 5: Secondary | ICD-10-CM | POA: Diagnosis not present

## 2020-06-08 DIAGNOSIS — F17290 Nicotine dependence, other tobacco product, uncomplicated: Secondary | ICD-10-CM | POA: Diagnosis present

## 2020-06-08 DIAGNOSIS — N186 End stage renal disease: Secondary | ICD-10-CM | POA: Diagnosis present

## 2020-06-08 DIAGNOSIS — Z8249 Family history of ischemic heart disease and other diseases of the circulatory system: Secondary | ICD-10-CM

## 2020-06-08 DIAGNOSIS — E785 Hyperlipidemia, unspecified: Secondary | ICD-10-CM | POA: Diagnosis present

## 2020-06-08 DIAGNOSIS — F32A Depression, unspecified: Secondary | ICD-10-CM | POA: Diagnosis present

## 2020-06-08 DIAGNOSIS — J441 Chronic obstructive pulmonary disease with (acute) exacerbation: Secondary | ICD-10-CM | POA: Diagnosis present

## 2020-06-08 DIAGNOSIS — Z20822 Contact with and (suspected) exposure to covid-19: Secondary | ICD-10-CM | POA: Diagnosis present

## 2020-06-08 DIAGNOSIS — R509 Fever, unspecified: Secondary | ICD-10-CM | POA: Diagnosis present

## 2020-06-08 DIAGNOSIS — R600 Localized edema: Secondary | ICD-10-CM | POA: Diagnosis not present

## 2020-06-08 DIAGNOSIS — Z953 Presence of xenogenic heart valve: Secondary | ICD-10-CM | POA: Diagnosis not present

## 2020-06-08 DIAGNOSIS — N400 Enlarged prostate without lower urinary tract symptoms: Secondary | ICD-10-CM | POA: Diagnosis present

## 2020-06-08 DIAGNOSIS — D472 Monoclonal gammopathy: Secondary | ICD-10-CM | POA: Diagnosis present

## 2020-06-08 DIAGNOSIS — E876 Hypokalemia: Secondary | ICD-10-CM | POA: Diagnosis not present

## 2020-06-08 DIAGNOSIS — A4181 Sepsis due to Enterococcus: Principal | ICD-10-CM | POA: Diagnosis present

## 2020-06-08 DIAGNOSIS — J440 Chronic obstructive pulmonary disease with acute lower respiratory infection: Secondary | ICD-10-CM | POA: Diagnosis present

## 2020-06-08 DIAGNOSIS — I2699 Other pulmonary embolism without acute cor pulmonale: Secondary | ICD-10-CM | POA: Diagnosis present

## 2020-06-08 DIAGNOSIS — I2510 Atherosclerotic heart disease of native coronary artery without angina pectoris: Secondary | ICD-10-CM | POA: Diagnosis present

## 2020-06-08 DIAGNOSIS — Z6841 Body Mass Index (BMI) 40.0 and over, adult: Secondary | ICD-10-CM | POA: Diagnosis not present

## 2020-06-08 DIAGNOSIS — Z952 Presence of prosthetic heart valve: Secondary | ICD-10-CM | POA: Diagnosis not present

## 2020-06-08 DIAGNOSIS — I350 Nonrheumatic aortic (valve) stenosis: Secondary | ICD-10-CM | POA: Diagnosis not present

## 2020-06-08 DIAGNOSIS — D631 Anemia in chronic kidney disease: Secondary | ICD-10-CM | POA: Diagnosis present

## 2020-06-08 DIAGNOSIS — R6520 Severe sepsis without septic shock: Secondary | ICD-10-CM | POA: Diagnosis present

## 2020-06-08 DIAGNOSIS — Z87442 Personal history of urinary calculi: Secondary | ICD-10-CM

## 2020-06-08 DIAGNOSIS — N2581 Secondary hyperparathyroidism of renal origin: Secondary | ICD-10-CM | POA: Diagnosis present

## 2020-06-08 DIAGNOSIS — F329 Major depressive disorder, single episode, unspecified: Secondary | ICD-10-CM | POA: Diagnosis present

## 2020-06-08 DIAGNOSIS — J449 Chronic obstructive pulmonary disease, unspecified: Secondary | ICD-10-CM | POA: Diagnosis not present

## 2020-06-08 DIAGNOSIS — I739 Peripheral vascular disease, unspecified: Secondary | ICD-10-CM | POA: Diagnosis present

## 2020-06-08 DIAGNOSIS — I361 Nonrheumatic tricuspid (valve) insufficiency: Secondary | ICD-10-CM | POA: Diagnosis not present

## 2020-06-08 DIAGNOSIS — Z951 Presence of aortocoronary bypass graft: Secondary | ICD-10-CM

## 2020-06-08 DIAGNOSIS — R944 Abnormal results of kidney function studies: Secondary | ICD-10-CM | POA: Diagnosis not present

## 2020-06-08 DIAGNOSIS — I2583 Coronary atherosclerosis due to lipid rich plaque: Secondary | ICD-10-CM | POA: Diagnosis not present

## 2020-06-08 DIAGNOSIS — I330 Acute and subacute infective endocarditis: Secondary | ICD-10-CM | POA: Diagnosis not present

## 2020-06-08 DIAGNOSIS — Z96653 Presence of artificial knee joint, bilateral: Secondary | ICD-10-CM | POA: Diagnosis present

## 2020-06-08 DIAGNOSIS — Z992 Dependence on renal dialysis: Secondary | ICD-10-CM | POA: Diagnosis not present

## 2020-06-08 DIAGNOSIS — Z7982 Long term (current) use of aspirin: Secondary | ICD-10-CM

## 2020-06-08 DIAGNOSIS — J9621 Acute and chronic respiratory failure with hypoxia: Secondary | ICD-10-CM | POA: Diagnosis not present

## 2020-06-08 DIAGNOSIS — J189 Pneumonia, unspecified organism: Secondary | ICD-10-CM | POA: Diagnosis not present

## 2020-06-08 DIAGNOSIS — Z79899 Other long term (current) drug therapy: Secondary | ICD-10-CM

## 2020-06-08 DIAGNOSIS — I340 Nonrheumatic mitral (valve) insufficiency: Secondary | ICD-10-CM | POA: Diagnosis not present

## 2020-06-08 DIAGNOSIS — I248 Other forms of acute ischemic heart disease: Secondary | ICD-10-CM | POA: Diagnosis present

## 2020-06-08 DIAGNOSIS — I110 Hypertensive heart disease with heart failure: Secondary | ICD-10-CM | POA: Diagnosis not present

## 2020-06-08 DIAGNOSIS — I342 Nonrheumatic mitral (valve) stenosis: Secondary | ICD-10-CM | POA: Diagnosis not present

## 2020-06-08 LAB — CBC WITH DIFFERENTIAL/PLATELET
Abs Immature Granulocytes: 0.21 10*3/uL — ABNORMAL HIGH (ref 0.00–0.07)
Basophils Absolute: 0.1 10*3/uL (ref 0.0–0.1)
Basophils Relative: 0 %
Eosinophils Absolute: 0 10*3/uL (ref 0.0–0.5)
Eosinophils Relative: 0 %
HCT: 27.1 % — ABNORMAL LOW (ref 39.0–52.0)
Hemoglobin: 8.8 g/dL — ABNORMAL LOW (ref 13.0–17.0)
Immature Granulocytes: 1 %
Lymphocytes Relative: 5 %
Lymphs Abs: 1.1 10*3/uL (ref 0.7–4.0)
MCH: 31.9 pg (ref 26.0–34.0)
MCHC: 32.5 g/dL (ref 30.0–36.0)
MCV: 98.2 fL (ref 80.0–100.0)
Monocytes Absolute: 0.7 10*3/uL (ref 0.1–1.0)
Monocytes Relative: 3 %
Neutro Abs: 20.2 10*3/uL — ABNORMAL HIGH (ref 1.7–7.7)
Neutrophils Relative %: 91 %
Platelets: 224 10*3/uL (ref 150–400)
RBC: 2.76 MIL/uL — ABNORMAL LOW (ref 4.22–5.81)
RDW: 14.2 % (ref 11.5–15.5)
WBC: 22.3 10*3/uL — ABNORMAL HIGH (ref 4.0–10.5)
nRBC: 0 % (ref 0.0–0.2)

## 2020-06-08 LAB — RESP PANEL BY RT-PCR (FLU A&B, COVID) ARPGX2
Influenza A by PCR: NEGATIVE
Influenza B by PCR: NEGATIVE
SARS Coronavirus 2 by RT PCR: NEGATIVE

## 2020-06-08 LAB — URINALYSIS, ROUTINE W REFLEX MICROSCOPIC
Bacteria, UA: NONE SEEN
Bilirubin Urine: NEGATIVE
Glucose, UA: 50 mg/dL — AB
Hgb urine dipstick: NEGATIVE
Ketones, ur: NEGATIVE mg/dL
Leukocytes,Ua: NEGATIVE
Nitrite: NEGATIVE
Protein, ur: >=300 mg/dL — AB
Specific Gravity, Urine: 1.014 (ref 1.005–1.030)
pH: 5 (ref 5.0–8.0)

## 2020-06-08 LAB — COMPREHENSIVE METABOLIC PANEL
ALT: 14 U/L (ref 0–44)
AST: 24 U/L (ref 15–41)
Albumin: 3.1 g/dL — ABNORMAL LOW (ref 3.5–5.0)
Alkaline Phosphatase: 56 U/L (ref 38–126)
Anion gap: 13 (ref 5–15)
BUN: 71 mg/dL — ABNORMAL HIGH (ref 8–23)
CO2: 21 mmol/L — ABNORMAL LOW (ref 22–32)
Calcium: 8.6 mg/dL — ABNORMAL LOW (ref 8.9–10.3)
Chloride: 101 mmol/L (ref 98–111)
Creatinine, Ser: 5.53 mg/dL — ABNORMAL HIGH (ref 0.61–1.24)
GFR, Estimated: 10 mL/min — ABNORMAL LOW (ref >60)
Glucose, Bld: 146 mg/dL — ABNORMAL HIGH (ref 70–99)
Potassium: 3.9 mmol/L (ref 3.5–5.1)
Sodium: 135 mmol/L (ref 135–145)
Total Bilirubin: 0.6 mg/dL (ref 0.3–1.2)
Total Protein: 6.9 g/dL (ref 6.5–8.1)

## 2020-06-08 LAB — C-REACTIVE PROTEIN: CRP: 16.7 mg/dL — ABNORMAL HIGH (ref <1.0)

## 2020-06-08 LAB — PROCALCITONIN: Procalcitonin: 1.26 ng/mL

## 2020-06-08 LAB — TRIGLYCERIDES: Triglycerides: 80 mg/dL (ref <150)

## 2020-06-08 LAB — TROPONIN I (HIGH SENSITIVITY): Troponin I (High Sensitivity): 356 ng/L (ref <18)

## 2020-06-08 LAB — LACTIC ACID, PLASMA
Lactic Acid, Venous: 1.1 mmol/L (ref 0.5–1.9)
Lactic Acid, Venous: 0.8 mmol/L (ref 0.5–1.9)

## 2020-06-08 LAB — BRAIN NATRIURETIC PEPTIDE: B Natriuretic Peptide: 2673 pg/mL — ABNORMAL HIGH (ref 0.0–100.0)

## 2020-06-08 LAB — FIBRINOGEN: Fibrinogen: 718 mg/dL — ABNORMAL HIGH (ref 210–475)

## 2020-06-08 LAB — LACTATE DEHYDROGENASE: LDH: 204 U/L — ABNORMAL HIGH (ref 98–192)

## 2020-06-08 LAB — D-DIMER, QUANTITATIVE: D-Dimer, Quant: 2.67 ug/mL-FEU — ABNORMAL HIGH (ref 0.00–0.50)

## 2020-06-08 LAB — FERRITIN: Ferritin: 386 ng/mL — ABNORMAL HIGH (ref 24–336)

## 2020-06-08 MED ORDER — FINASTERIDE 5 MG PO TABS
5.0000 mg | ORAL_TABLET | Freq: Every day | ORAL | Status: DC
  Administered 2020-06-09 – 2020-06-14 (×6): 5 mg via ORAL
  Filled 2020-06-08 (×6): qty 1

## 2020-06-08 MED ORDER — ACETAMINOPHEN 325 MG PO TABS
650.0000 mg | ORAL_TABLET | Freq: Once | ORAL | Status: AC
  Administered 2020-06-08: 18:00:00 650 mg via ORAL
  Filled 2020-06-08: qty 2

## 2020-06-08 MED ORDER — LEVOTHYROXINE SODIUM 75 MCG PO TABS: 175.0000 ug | ORAL_TABLET | Freq: Every day | ORAL | Status: DC

## 2020-06-08 MED ORDER — ONDANSETRON HCL 4 MG PO TABS: 4.0000 mg | ORAL_TABLET | Freq: Four times a day (QID) | ORAL | Status: DC | PRN

## 2020-06-08 MED ORDER — FUROSEMIDE 10 MG/ML IJ SOLN
40.0000 mg | Freq: Once | INTRAMUSCULAR | Status: AC
  Administered 2020-06-08: 20:00:00 40 mg via INTRAVENOUS
  Filled 2020-06-08: qty 4

## 2020-06-08 MED ORDER — LATANOPROST 0.005 % OP SOLN
1.0000 [drp] | Freq: Every day | OPHTHALMIC | Status: DC
  Administered 2020-06-09 – 2020-06-13 (×5): 1 [drp] via OPHTHALMIC
  Filled 2020-06-08 (×2): qty 2.5

## 2020-06-08 MED ORDER — RENA-VITE PO TABS
1.0000 | ORAL_TABLET | Freq: Every day | ORAL | Status: DC
  Administered 2020-06-09 – 2020-06-14 (×6): 1 via ORAL
  Filled 2020-06-08 (×6): qty 1

## 2020-06-08 MED ORDER — PANTOPRAZOLE SODIUM 40 MG PO TBEC
40.0000 mg | DELAYED_RELEASE_TABLET | Freq: Every day | ORAL | Status: DC
  Administered 2020-06-09 – 2020-06-14 (×6): 40 mg via ORAL
  Filled 2020-06-08 (×7): qty 1

## 2020-06-08 MED ORDER — VITAMIN D 25 MCG (1000 UNIT) PO TABS
4000.0000 [IU] | ORAL_TABLET | Freq: Every day | ORAL | Status: DC
  Administered 2020-06-09 – 2020-06-14 (×6): 4000 [IU] via ORAL
  Filled 2020-06-08 (×6): qty 4

## 2020-06-08 MED ORDER — ONE-DAILY MULTI VITAMINS PO TABS: 1.0000 | ORAL_TABLET | Freq: Every day | ORAL | Status: DC

## 2020-06-08 MED ORDER — SENNOSIDES-DOCUSATE SODIUM 8.6-50 MG PO TABS
1.0000 | ORAL_TABLET | Freq: Two times a day (BID) | ORAL | Status: DC
  Administered 2020-06-08 – 2020-06-13 (×9): 1 via ORAL
  Filled 2020-06-08 (×11): qty 1

## 2020-06-08 MED ORDER — SODIUM CHLORIDE 0.9 % IV SOLN
1000.0000 mL | INTRAVENOUS | Status: DC
Start: 2020-06-08 — End: 2020-06-10
  Administered 2020-06-08 – 2020-06-09 (×3): 1000 mL via INTRAVENOUS

## 2020-06-08 MED ORDER — VANCOMYCIN HCL 2000 MG/400ML IV SOLN
2000.0000 mg | Freq: Once | INTRAVENOUS | Status: AC
  Administered 2020-06-08: 18:00:00 2000 mg via INTRAVENOUS
  Filled 2020-06-08: qty 400

## 2020-06-08 MED ORDER — ALBUTEROL SULFATE (2.5 MG/3ML) 0.083% IN NEBU
INHALATION_SOLUTION | RESPIRATORY_TRACT | Status: AC
  Administered 2020-06-08: 21:00:00 7.5 mg
  Filled 2020-06-08: qty 9

## 2020-06-08 MED ORDER — METOPROLOL TARTRATE 25 MG PO TABS
25.0000 mg | ORAL_TABLET | Freq: Two times a day (BID) | ORAL | Status: DC
  Administered 2020-06-09 – 2020-06-14 (×11): 25 mg via ORAL
  Filled 2020-06-08 (×12): qty 1

## 2020-06-08 MED ORDER — ACETAMINOPHEN 650 MG RE SUPP: 650.0000 mg | Freq: Four times a day (QID) | RECTAL | Status: DC | PRN

## 2020-06-08 MED ORDER — FUROSEMIDE 10 MG/ML IJ SOLN
40.0000 mg | Freq: Once | INTRAMUSCULAR | Status: AC
  Administered 2020-06-08: 19:00:00 40 mg via INTRAVENOUS
  Filled 2020-06-08: qty 4

## 2020-06-08 MED ORDER — ACETAMINOPHEN 325 MG PO TABS: 650.0000 mg | ORAL_TABLET | Freq: Four times a day (QID) | ORAL | Status: DC | PRN

## 2020-06-08 MED ORDER — METHYLPREDNISOLONE SODIUM SUCC 125 MG IJ SOLR
80.0000 mg | Freq: Three times a day (TID) | INTRAMUSCULAR | Status: DC
  Administered 2020-06-08 – 2020-06-10 (×5): 80 mg via INTRAVENOUS
  Filled 2020-06-08 (×5): qty 2

## 2020-06-08 MED ORDER — ONDANSETRON HCL 4 MG/2ML IJ SOLN: 4.0000 mg | Freq: Four times a day (QID) | INTRAMUSCULAR | Status: DC | PRN

## 2020-06-08 MED ORDER — ASPIRIN 325 MG PO TABS
325.0000 mg | ORAL_TABLET | Freq: Every day | ORAL | Status: DC
  Administered 2020-06-09 – 2020-06-14 (×6): 325 mg via ORAL
  Filled 2020-06-08 (×6): qty 1

## 2020-06-08 MED ORDER — SODIUM BICARBONATE 650 MG PO TABS
650.0000 mg | ORAL_TABLET | Freq: Three times a day (TID) | ORAL | Status: DC
  Administered 2020-06-09: 650 mg via ORAL
  Filled 2020-06-08: qty 1

## 2020-06-08 MED ORDER — FUROSEMIDE 10 MG/ML IJ SOLN
80.0000 mg | Freq: Once | INTRAMUSCULAR | Status: AC
  Administered 2020-06-08: 22:00:00 80 mg via INTRAVENOUS
  Filled 2020-06-08: qty 8

## 2020-06-08 MED ORDER — OMEGA-3-ACID ETHYL ESTERS 1 G PO CAPS
1.0000 g | ORAL_CAPSULE | Freq: Two times a day (BID) | ORAL | Status: DC
  Administered 2020-06-09 – 2020-06-14 (×11): 1 g via ORAL
  Filled 2020-06-08 (×11): qty 1

## 2020-06-08 MED ORDER — SIMVASTATIN 20 MG PO TABS
40.0000 mg | ORAL_TABLET | Freq: Every evening | ORAL | Status: DC
  Administered 2020-06-09 – 2020-06-13 (×5): 40 mg via ORAL
  Filled 2020-06-08 (×6): qty 2

## 2020-06-08 MED ORDER — SODIUM CHLORIDE 0.9 % IV SOLN
2.0000 g | Freq: Once | INTRAVENOUS | Status: AC
  Administered 2020-06-08: 18:00:00 2 g via INTRAVENOUS
  Filled 2020-06-08: qty 2

## 2020-06-08 MED ORDER — HEPARIN SODIUM (PORCINE) 5000 UNIT/ML IJ SOLN
5000.0000 [IU] | Freq: Three times a day (TID) | INTRAMUSCULAR | Status: DC
  Administered 2020-06-09 – 2020-06-11 (×7): 5000 [IU] via SUBCUTANEOUS
  Filled 2020-06-08 (×7): qty 1

## 2020-06-08 MED ORDER — CALCITRIOL 0.25 MCG PO CAPS
0.2500 ug | ORAL_CAPSULE | ORAL | Status: DC
  Administered 2020-06-09 – 2020-06-13 (×3): 0.25 ug via ORAL
  Filled 2020-06-08 (×3): qty 1

## 2020-06-08 MED ORDER — IPRATROPIUM-ALBUTEROL 0.5-2.5 (3) MG/3ML IN SOLN
3.0000 mL | RESPIRATORY_TRACT | Status: DC
  Administered 2020-06-08 – 2020-06-09 (×7): 3 mL via RESPIRATORY_TRACT
  Filled 2020-06-08 (×7): qty 3

## 2020-06-08 MED ORDER — SERTRALINE HCL 100 MG PO TABS
100.0000 mg | ORAL_TABLET | Freq: Every day | ORAL | Status: DC
  Administered 2020-06-09 – 2020-06-14 (×6): 100 mg via ORAL
  Filled 2020-06-08 (×6): qty 1

## 2020-06-08 NOTE — ED Triage Notes (Signed)
Dr. Juel Burrow office did a rapid Covid test and result was negative.

## 2020-06-08 NOTE — ED Triage Notes (Signed)
Pt with fever and SOB for past 3 days, productive cough with blood at times.

## 2020-06-08 NOTE — ED Triage Notes (Signed)
RA sats on 81% in triage.

## 2020-06-08 NOTE — H&P (Signed)
TRH H&P   Patient Demographics:    [name], is a 74 y.o. male  MRN: [id number]   DOB - Oct 09, 1945  Admit Date - 06/08/2020  Outpatient Primary MD for the patient is [place] Crane, Edwinna Areola, MD  Referring MD/NP/PA: [facility] [place] [facility]: [facility]  Patient coming from: Home  Chief Complaint  Patient presents with  . Fever      HPI:    [name]  is a 74 y.o. male, with past medical history of COPD, CAD, hypertension, depression, hypothyroidism, hyperlipidemia, obesity, MGUS, patient with progressive renal disease, is being planned to initiate dialysis, he is is been followed by [facility] nephrology, patient had peritoneal dialysis catheter inserted on 05/14/2020, and left aVF inserted by Dr. Dixie Dials 82/09/2351, patient had 5 session of training peritoneal dialysis last week, per wife report he did receive IV iron for anemia, she reports he did have 1600 cc negative balance during session yesterday, patient presented to ED secondary to complaints of fever and chills, he is vaccinated against Covid, reports progressive dyspnea over the last couple days, worsening lower extremity edema over last 2 weeks, as well he does report some wheezing, dry heaving, he denies any chest pain. -In ED he was saturating in the mid 80s on room air, tolerating 2 L nasal cannula, chest x-ray significant for diffuse multifocal opacities related to volume overload versus infectious process, he was febrile of 101, with leukocytosis of 22K, procalcitonin at 1.26, CRP at 16.7, D-dimers at 2.67, worsening anemia with hemoglobin of 8.8, he was started on broad-spectrum antibiotics and [facility] hospital[facility] consulted to admit.    Review of systems:    In addition to the HPI above, Reports fever and chills for last 3 days No Headache, No changes with Vision or hearing, No problems  swallowing food or Liquids, No Chest pain, Cough, reports wheezing and shortness of breath No Abdominal pain,  Bowel movements are regular, he does report dry heaving No Blood in stool or Urine, No dysuria, No new skin rashes or bruises, No new joints pains-aches,  No new weakness, tingling, numbness in any extremity, No recent weight gain or loss, No polyuria, polydypsia or polyphagia, No significant Mental Stressors.  A full 10 point Review of Systems was done, except as stated above, all other Review of Systems were negative.   With Past History of the following :    Past Medical History:  Diagnosis Date  . Aortic stenosis   . Arthritis   . Atherosclerotic heart disease of native coronary artery without angina pectoris   . CAD (coronary artery disease)   . Chronic obstructive pulmonary disease, unspecified ([place])   . COPD (chronic obstructive pulmonary disease) ([place])   . Depression   . Essential (primary) hypertension   . Gout, unspecified   . Heart murmur   . Hemorrhoid   . Hyperlipidemia,  unspecified   . Hypertension   . Hypothyroidism   . Hypothyroidism, unspecified   . Kidney stones   . Major depressive disorder, single episode, unspecified   . Morbid (severe) obesity due to excess calories ([place])   . Nonrheumatic aortic (valve) stenosis   . Orthostatic hypotension   . Syncope   . Thyroid disease       Past Surgical History:  Procedure Laterality Date  . ANGIOPASTY    . AORTIC VALVE REPLACEMENT  07/11/2012   Procedure: AORTIC VALVE REPLACEMENT (AVR);  Surgeon: Gaye Pollack, MD;  Location: [place];  Service: Open Heart Surgery;  Laterality: N/A;  . AV FISTULA PLACEMENT Left 05/30/2020   Procedure: ARTERIOVENOUS (AV) FISTULA CREATION LEFT;  Surgeon: [name], MD;  Location: [facility];  Service: Vascular;  Laterality: Left;  . CARDIAC CATHETERIZATION    . CAROTID ENDARTERECTOMY    . CORONARY ARTERY BYPASS GRAFT  07/11/2012   Procedure: CORONARY ARTERY BYPASS  GRAFTING (CABG);  Surgeon: Gaye Pollack, MD;  Location: [place];  Service: Open Heart Surgery;  Laterality: N/A;  CABG x one,  using right leg greater saphenous vein harvested endoscopically  . INTRAOPERATIVE TRANSESOPHAGEAL ECHOCARDIOGRAM  07/11/2012   Procedure: INTRAOPERATIVE TRANSESOPHAGEAL ECHOCARDIOGRAM;  Surgeon: Gaye Pollack, MD;  [facility];  Service: Open Heart Surgery;  Laterality: N/A;  . JOINT REPLACEMENT        Social History:     Social History   Tobacco Use  . Smoking status: Former Smoker    Packs/day: 3.00    Years: 50.00    Pack years: 150.00    Types: Cigarettes    Quit date: 12/13/2010    Years since quitting: 9.4  . Smokeless tobacco: Former Systems developer  . Tobacco comment: Electronic cigarette...USES INFREQUENTLY NOW  Substance Use Topics  . Alcohol use: Yes    Comment: Rare      Family History :     Family History  Problem Relation Age of Onset  . Heart disease Mother   . Heart disease Father   . Hypertension Father     Home Medications:   Prior to Admission medications   Medication Sig Start Date End Date Taking? Authorizing Provider  albuterol (PROVENTIL HFA;VENTOLIN HFA) 108 (90 BASE) MCG/ACT inhaler Inhale 2 puffs into the lungs every 6 (six) hours as needed for wheezing or shortness of breath.     [provider]  allopurinol (ZYLOPRIM) 300 MG tablet Take 300 mg by mouth daily.    [provider]  amLODipine (NORVASC) 5 MG tablet Take 5 mg by mouth daily.    [provider]  aspirin 325 MG tablet Take 325 mg by mouth in the morning and at bedtime.     [provider]  B Complex-C-Folic Acid (RENAL) 1 MG CAPS Take 1 capsule by mouth daily. 05/16/20   [provider]  calcitRIOL (ROCALTROL) 0.25 MCG capsule Take 0.25 mcg by mouth every other day.     [provider]  Cholecalciferol (VITAMIN D3) 50 MCG (2000 UT) capsule Take 2,000-4,000 Units by mouth See admin instructions. Take 4000 units by  mouth in the morning and 2000 units in the evening    [provider]  finasteride (PROSCAR) 5 MG tablet Take 5 mg by mouth daily.    [provider]  fluticasone (FLONASE) 50 MCG/ACT nasal spray Place 1 spray into both nostrils daily.    [provider]  furosemide (LASIX) 40 MG tablet Take  40 mg by mouth 2 (two) times daily.    [provider]  hydrALAZINE (APRESOLINE) 25 MG tablet Take 25 mg by mouth 3 (three) times daily.    [provider]  L-Lysine 500 MG TABS Take 500 mg by mouth daily.     [provider]  levothyroxine (SYNTHROID) 175 MCG tablet Take 175 mcg by mouth daily before breakfast.  05/12/12   [provider]  metoprolol tartrate (LOPRESSOR) 25 MG tablet Take 25 mg by mouth 2 (two) times daily.    [provider]  Multiple Minerals-Vitamins (CALCIUM-MAGNESIUM-ZINC-D3) TABS Take 1 tablet by mouth daily.    [provider]  Multiple Vitamin (MULTIVITAMIN) tablet Take 1 tablet by mouth daily.    [provider]  Omega-3 Fatty Acids (FISH OIL PO) Take 2 capsules by mouth daily.     [provider]  oxyCODONE-acetaminophen (PERCOCET) 5-325 MG tablet Take 1 tablet by mouth every 6 (six) hours as needed for severe pain. 05/30/20   [name], MD  pantoprazole (PROTONIX) 40 MG tablet Take 40 mg by mouth daily.    [provider]  sertraline (ZOLOFT) 100 MG tablet Take 100 mg by mouth daily.    [provider]  simvastatin (ZOCOR) 40 MG tablet Take 40 mg by mouth every evening.    [provider]  sodium bicarbonate 650 MG tablet Take 650 mg by mouth 3 (three) times daily.    [provider]  Travoprost, BAK Free, (TRAVATAN) 0.004 % SOLN ophthalmic solution Place 1 drop into both eyes at bedtime.    [provider]     Allergies:    No Known Allergies   Physical Exam:   Vitals  Blood pressure (!) 143/79, pulse 81, temperature 99.2 F  (37.3 C), temperature source Oral, resp. rate 20, height 5\' 9"  (1.753 m), weight 117.9 kg, SpO2 94 %.   1. General obese male, laying in bed, in mild distress  2. Normal affect and insight, Not Suicidal or Homicidal, Awake Alert, Oriented X 3.  3. No F.N deficits, ALL C.Nerves Intact, Strength 5/5 all 4 extremities, Sensation intact all 4 extremities, Plantars down going.  4. Ears and Eyes appear Normal, Conjunctivae clear, PERRLA. Moist Oral Mucosa.  5. Supple Neck, No JVD, No cervical lymphadenopathy appriciated, No Carotid Bruits.  6. Symmetrical Chest wall movement, patient mildly tachypneic, with some use of accessory muscles, and he is with diffuse wheezing and bibasilar crackles  7. RRR, No Gallops, Rubs, systolic murmurs, No Parasternal Heave.  +2 edema bilaterally  8. Positive Bowel Sounds, Abdomen Soft, No tenderness, No organomegaly appriciated,No rebound -guarding or rigidity.  9.  No Cyanosis, Normal Skin Turgor, No Skin Rash or Bruise.  10. Good muscle tone,  joints appear normal , no effusions, Normal ROM.  11. No Palpable Lymph Nodes in Neck or Axillae    Data Review:    CBC Recent Labs  Lab 06/08/20 1332  WBC 22.3*  HGB 8.8*  HCT 27.1*  PLT 224  MCV 98.2  MCH 31.9  MCHC 32.5  RDW 14.2  LYMPHSABS 1.1  MONOABS 0.7  EOSABS 0.0  BASOSABS 0.1   ------------------------------------------------------------------------------------------------------------------  Chemistries  Recent Labs  Lab 06/08/20 1621  NA 135  K 3.9  CL 101  CO2 21*  GLUCOSE 146*  BUN 71*  CREATININE 5.53*  CALCIUM 8.6*  AST 24  ALT 14  ALKPHOS 56  BILITOT 0.6   ------------------------------------------------------------------------------------------------------------------ estimated creatinine clearance is 14.6 mL/min (A) (  by C-G formula based on SCr of 5.53 mg/dL  (H)). ------------------------------------------------------------------------------------------------------------------ No results for input(s): TSH, T4TOTAL, T3FREE, THYROIDAB in the last 72 hours.  Invalid input(s): FREET3  Coagulation profile No results for input(s): INR, PROTIME in the last 168 hours. ------------------------------------------------------------------------------------------------------------------- Recent Labs    06/08/20 1621  DDIMER 2.67*   -------------------------------------------------------------------------------------------------------------------  Cardiac Enzymes No results for input(s): CKMB, TROPONINI, MYOGLOBIN in the last 168 hours.  Invalid input(s): CK ------------------------------------------------------------------------------------------------------------------    Component Value Date/Time   BNP 2,673.0 (H) 06/08/2020 1647   BNP 14.3 06/03/2012 1401     ---------------------------------------------------------------------------------------------------------------  Urinalysis    Component Value Date/Time   COLORURINE YELLOW 06/08/2020 1859   APPEARANCEUR HAZY (A) 06/08/2020 1859   LABSPEC 1.014 06/08/2020 1859   PHURINE 5.0 06/08/2020 1859   GLUCOSEU 50 (A) 06/08/2020 1859   HGBUR NEGATIVE 06/08/2020 1859   BILIRUBINUR NEGATIVE 06/08/2020 1859   KETONESUR NEGATIVE 06/08/2020 1859   PROTEINUR >=300 (A) 06/08/2020 1859   UROBILINOGEN 0.2 07/11/2012 0545   NITRITE NEGATIVE 06/08/2020 1859   LEUKOCYTESUR NEGATIVE 06/08/2020 1859    ----------------------------------------------------------------------------------------------------------------   Imaging Results:    DG Chest Portable 1 View  Result Date: 06/08/2020 CLINICAL DATA:  74 year old male with shortness of breath and chest pain EXAM: PORTABLE CHEST 1 VIEW COMPARISON:  08/05/2012 FINDINGS: Mild cardiomegaly, unchanged. Atherosclerotic calcification of the aortic arch.  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 Scattered diffuse patchy pulmonary opacities with a basal predominance. No significant pleural effusion. No pneumothorax. Median sternotomy wires remain in place. No acute osseous abnormality. IMPRESSION: Diffuse bilateral mid lung and lower lobe predominant patchy pulmonary opacities, most compatible with multifocal pneumonia in the setting of fever. Pulmonary edema could appear similarly. Electronically Signed   By: Ruthann Cancer MD   On: 06/08/2020 14:10     Assessment & Plan:    Active Problems:   CAD (coronary artery disease)   Depression   Essential (primary) hypertension   Chronic obstructive pulmonary disease, unspecified (H[facility])   Hyperlipidemia, unspecified   Hypothyroidism, unspecified   Atherosclerotic heart disease of native coronary artery without angina pectoris   Morbid (severe) obesity due to excess calories ([facility])   Fever  Acute hypoxic respiratory failure -He is saturating 86% on room air, currently 94% on 2 L nasal cannula -Multifactorial, in the setting of volume overload due to renal disease, COPD exacerbation, and possible pneumonia. -Patient with elevated D-dimers, will obtain VQ scan (will avoid CTA chest given he still making urine) and venous Dopplers to rule out DVT. -Significant evidence of volume overload, will give another 80 mg of IV Lasix. -He will be on broad-spectrum antibiotic coverage to cover for pneumonia as well.  COPD exacerbation -He is with significant wheezing, he will be started on IV steroids, scheduled duo nebs.  Possible multifocal pneumonia -Patient presents with fever, chest x-ray significant for multifocal opacity (volume overload versus infectious process), procalcitonin is elevated (unreliable in the setting of renal disease). -For now we will continue with broad-spectrum antibiotic coverage.  CKD stage V, approaching ESRD evidence of significant volume overload -Patient has started on peritoneal  dialysis for last 5 days as training, report he had 1600 cc of volume removed with peritoneal dialysis yesterday. -With significant volume overload, he received already 80 mg of IV in ED, remains significantly volume overloaded, will give another 80 mg of IV Lasix, patient will be transferred [facility] evaluated by nephrology to determine if this should proceed with peritoneal versus hemodialysis. -Left AV fistula inserted by  Dr. Dixie Dials 54/08/6065 -Peritoneal hemodialysis inserted by general surgery at [facility] in [place] on 7/16.  Fever/leukocytosis -Questionable pneumonia on x-ray, negative urine analysis, abdominal exam is nontender, no clinical evidence of peritonitis. -We will continue with broad-spectrum antibiotics, follow blood cultures, will obtain peritoneal fluid culture via hemodialysis catheter when patient gets to [facility].  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 -Procrit per renal  Hypertension -Resume metoprolol in the setting of known history of CAD, I will hold Norvasc and hydralazine allow room for IV diuresis and dialysis if needed.  CAD -Continue with aspirin, statin and beta-blockers  Hypothyroidism -Continue with Synthroid  BPH -Continue with Proscar  Depression -Continue with home medications  Hyperlipidemia -Continue with home medications    DVT Prophylaxis Heparin   AM Labs Ordered, also please review Full Orders  Family Communication: Admission, patients condition and plan of care including tests being ordered have been discussed with the patient and wife who indicate understanding and agree with the plan and Code Status.  Code Status Full  Likely DC to  Home  Condition GUARDED    Consults called: renal consulted by ED in [place]    Admission status: inpatient     Time spent in minutes : 70 minutes   Phillips Climes  M.D on 06/08/2020 at 8:24 PM   [facility] - Office  [phone]

## 2020-06-08 NOTE — Progress Notes (Signed)
A consult was received from an ED physician for Vancomycin and cefepime per pharmacy dosing.  The patient's profile has been reviewed for ht/wt/allergies/indication/available labs.   A one time order has been placed for Vancomycin 2gm IV x 1, and cefepime 2gm IV x 1.  Further antibiotics/pharmacy consults should be ordered by admitting physician if indicated.                       Thank you, Cristy Friedlander 06/08/2020  5:13 PM

## 2020-06-08 NOTE — ED Notes (Addendum)
Tylenol last at 1145 this am.  Unknown which dose of tylenol.

## 2020-06-08 NOTE — ED Provider Notes (Addendum)
[facility] EMERGENCY DEPARTMENT Provider Note   CSN: [id number] Arrival date & time: 06/08/20  1240     History Chief Complaint  Patient presents with  . Fever    [name] is a 74 y.o. male.  The history is provided by the patient and medical records. No language interpreter was used.  Fever    74 year old male significant for COPD, CAD, hypertension, depression, obesity brought here via EMS from home for evaluation of fever and shortness of breath. Report recently had peritoneal dialysis access as well as L AV fistula place and was given iron infusion several days ago. Patient report for the past 3 to 4 days he has had decrease in appetite, productive cough with occasional trace of blood, having fever, and chills as well as having dry heaves.  He denies any significant headache, chest pain, abdominal pain, loss of taste or smell or dysuria.  He has been vaccinated for COVID-19 with 2 shots of Moderna.  He report 2-3 days ago he did had a rapid Covid test which was negative.  He does not wear oxygen at home.  He denies any recent sick contact.  He did take Tylenol prior to arrival.  Of note, approximately a week ago patient was admitted for left radial cephalic AV fistula creation by Dr. Donnetta Hutching due to history of chronic kidney disease.  Does do peritoneal dialysis.  Does not report of any abdominal pain.  Past Medical History:  Diagnosis Date  . Aortic stenosis   . Arthritis   . Atherosclerotic heart disease of native coronary artery without angina pectoris   . CAD (coronary artery disease)   . Chronic obstructive pulmonary disease, unspecified ([place])   . COPD (chronic obstructive pulmonary disease) ([place])   . Depression   . Essential (primary) hypertension   . Gout, unspecified   . Heart murmur   . Hemorrhoid   . Hyperlipidemia, unspecified   . Hypertension   . Hypothyroidism   . Hypothyroidism, unspecified   . Kidney stones   . Major depressive disorder, single episode,  unspecified   . Morbid (severe) obesity due to excess calories ([place])   . Nonrheumatic aortic (valve) stenosis   . Orthostatic hypotension   . Syncope   . Thyroid disease     Patient Active Problem List   Diagnosis Date Noted  . 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 Essential (primary) hypertension   . Chronic obstructive pulmonary disease, unspecified ([place])   . Hyperlipidemia, unspecified   . Hypothyroidism, unspecified   . Atherosclerotic heart disease of native coronary artery without angina pectoris   . Gout, unspecified   . Major depressive disorder, single episode, unspecified   . Morbid (severe) obesity due to excess calories ([place])   . Nonrheumatic aortic (valve) stenosis   . Orthostatic hypotension   . Hypertension   . Thyroid disease   . Arthritis   . COPD (chronic obstructive pulmonary disease) ([place])   . Heart murmur   . Aortic stenosis   . CAD (coronary artery disease)   . Syncope   . Depression   . Kidney stones     Past Surgical History:  Procedure Laterality Date  . ANGIOPASTY    . AORTIC VALVE REPLACEMENT  07/11/2012   Procedure: AORTIC VALVE REPLACEMENT (AVR);  Surgeon: Gaye Pollack, MD;  Location: [place];  Service: Open Heart Surgery;  Laterality: N/A;  . AV FISTULA PLACEMENT Left 05/30/2020   Procedure: ARTERIOVENOUS (AV) FISTULA CREATION LEFT;  Surgeon: [name], MD;  Location: [facility];  Service: Vascular;  Laterality: Left;  . CARDIAC CATHETERIZATION    . CAROTID ENDARTERECTOMY    . CORONARY ARTERY BYPASS GRAFT  07/11/2012   Procedure: CORONARY ARTERY BYPASS GRAFTING (CABG);  Surgeon: Gaye Pollack, MD;  Location: [place];  Service: Open Heart Surgery;  Laterality: N/A;  CABG x one,  using right leg greater saphenous vein harvested endoscopically  . INTRAOPERATIVE TRANSESOPHAGEAL ECHOCARDIOGRAM  07/11/2012   Procedure: INTRAOPERATIVE TRANSESOPHAGEAL ECHOCARDIOGRAM;  Surgeon: Gaye Pollack, MD;  Location: [facility] OR;  Service:  Open Heart Surgery;  Laterality: N/A;  . JOINT REPLACEMENT         Family History  Problem Relation Age of Onset  . Heart disease Mother   . Heart disease Father   . Hypertension Father     Social History   Tobacco Use  . Smoking status: Former Smoker    Packs/day: 3.00    Years: 50.00    Pack years: 150.00    Types: Cigarettes    Quit date: 12/13/2010    Years since quitting: 9.4  . Smokeless tobacco: Former Systems developer  . Tobacco comment: Electronic cigarette...USES INFREQUENTLY NOW  Vaping Use  . Vaping Use: Every day  . Start date: 02/01/2013  Substance Use Topics  . Alcohol use: Yes    Comment: Rare  . Drug use: No    Home Medications Prior to Admission medications   Medication Sig Start Date End Date Taking? Authorizing Provider  albuterol (PROVENTIL HFA;VENTOLIN HFA) 108 (90 BASE) MCG/ACT inhaler Inhale 2 puffs into the lungs every 6 (six) hours as needed for wheezing or shortness of breath.     [provider]  allopurinol (ZYLOPRIM) 300 MG tablet Take 300 mg by mouth daily.    [provider]  amLODipine (NORVASC) 5 MG tablet Take 5 mg by mouth daily.    [provider]  aspirin 325 MG tablet Take 325 mg by mouth in the morning and at bedtime.     [provider]  B Complex-C-Folic Acid (RENAL) 1 MG CAPS Take 1 capsule by mouth daily. 05/16/20   [provider]  calcitRIOL (ROCALTROL) 0.25 MCG capsule Take 0.25 mcg by mouth every other day.     [provider]  Cholecalciferol (VITAMIN D3) 50 MCG (2000 UT) capsule Take 2,000-4,000 Units by mouth See admin instructions. Take 4000 units by mouth in the morning and 2000 units in the evening    [provider]  finasteride (PROSCAR) 5 MG tablet Take 5 mg by mouth daily.    [provider]  fluticasone (FLONASE) 50 MCG/ACT nasal spray Place 1 spray into both nostrils daily.    [provider]  furosemide (LASIX) 40 MG tablet Take 40 mg by mouth 2  (two) times daily.    [provider]  hydrALAZINE (APRESOLINE) 25 MG tablet Take 25 mg by mouth 3 (three) times daily.    [provider]  L-Lysine 500 MG TABS Take 500 mg by mouth daily.     [provider]  levothyroxine (SYNTHROID) 175 MCG tablet Take 175 mcg by mouth daily before breakfast.  05/12/12   [provider]  metoprolol tartrate (LOPRESSOR) 25 MG tablet Take 25 mg by mouth 2 (two) times daily.    [provider]  Multiple Minerals-Vitamins (CALCIUM-MAGNESIUM-ZINC-D3) TABS Take 1 tablet by mouth daily.    [provider]  Multiple Vitamin (MULTIVITAMIN) tablet Take 1 tablet by mouth  daily.    [provider]  Omega-3 Fatty Acids (FISH OIL PO) Take 2 capsules by mouth daily.     [provider]  oxyCODONE-acetaminophen (PERCOCET) 5-325 MG tablet Take 1 tablet by mouth every 6 (six) hours as needed for severe pain. 05/30/20   [name], MD  pantoprazole (PROTONIX) 40 MG tablet Take 40 mg by mouth daily.    [provider]  sertraline (ZOLOFT) 100 MG tablet Take 100 mg by mouth daily.    [provider]  simvastatin (ZOCOR) 40 MG tablet Take 40 mg by mouth every evening.    [provider]  sodium bicarbonate 650 MG tablet Take 650 mg by mouth 3 (three) times daily.    [provider]  Travoprost, BAK Free, (TRAVATAN) 0.004 % SOLN ophthalmic solution Place 1 drop into both eyes at bedtime.    [provider]    Allergies    Patient has no known allergies.  Review of Systems   Review of Systems  Constitutional: Positive for fever.  All other systems reviewed and are negative.   Physical Exam Updated Vital Signs BP 109/62 (BP Location: Right Arm)   Pulse 96   Temp (!) 101 F (38.3 C) (Oral)   Resp (!) 22   Ht 5\' 9"  (1.753 m)   Wt 117.9 kg   SpO2 93%   BMI 38.40 kg/m   Physical Exam Vitals and nursing note reviewed.  Constitutional:      General: He  is not in acute distress.    Appearance: He is well-developed and well-nourished.  HENT:     Head: Atraumatic.  Eyes:     Conjunctiva/sclera: Conjunctivae normal.  Cardiovascular:     Rate and Rhythm: Normal rate and regular rhythm.     Pulses: Normal pulses.     Heart sounds: Normal heart sounds.  Pulmonary:     Effort: Pulmonary effort is normal.     Breath sounds: Rhonchi and rales present.  Abdominal:     Palpations: Abdomen is soft.     Tenderness: There is no abdominal tenderness.     Comments: Peritoneal dialysis site of normal appearance and abdomen is soft and nontender  Musculoskeletal:     Cervical back: Normal range of motion and neck supple. No rigidity.     Right lower leg: Edema present.     Left lower leg: Edema present.     Comments: AV fistula noted to left distal arm with palpable thrill and normal appearance.  Skin:    Findings: No rash.  Neurological:     Mental Status: He is alert and oriented to person, place, and time.  Psychiatric:        Mood and Affect: Mood and affect normal.     ED Results / Procedures / Treatments   Labs (all labs ordered are listed, but only abnormal results are displayed) Labs Reviewed  CBC WITH DIFFERENTIAL/PLATELET - Abnormal; Notable for the following components:      Result Value   WBC 22.3 (*)    RBC 2.76 (*)    Hemoglobin 8.8 (*)    HCT 27.1 (*)    Neutro Abs 20.2 (*)    Abs Immature Granulocytes 0.21 (*)    All other components within normal limits  COMPREHENSIVE METABOLIC PANEL - Abnormal; Notable for the following components:   CO2 21 (*)    Glucose, Bld 146 (*)    BUN 71 (*)    Creatinine, Ser 5.53 (*)  Calcium 8.6 (*)    Albumin 3.1 (*)    GFR, Estimated 10 (*)    All other components within normal limits  D-DIMER, QUANTITATIVE (NOT AT [facility]) - Abnormal; Notable for the following components:   D-Dimer, Quant 2.67 (*)    All other components within normal limits  LACTATE DEHYDROGENASE - Abnormal;  Notable for the following components:   LDH 204 (*)    All other components within normal limits  FERRITIN - Abnormal; Notable for the following components:   Ferritin 386 (*)    All other components within normal limits  FIBRINOGEN - Abnormal; Notable for the following components:   Fibrinogen 718 (*)    All other components within normal limits  C-REACTIVE PROTEIN - Abnormal; Notable for the following components:   CRP 16.7 (*)    All other components within normal limits  BRAIN NATRIURETIC PEPTIDE - Abnormal; Notable for the following components:   B Natriuretic Peptide 2,673.0 (*)    All other components within normal limits  URINALYSIS, ROUTINE W REFLEX MICROSCOPIC - Abnormal; Notable for the following components:   APPearance HAZY (*)    Glucose, UA 50 (*)    Protein, ur >=300 (*)    All other components within normal limits  TROPONIN I (HIGH SENSITIVITY) - Abnormal; Notable for the following components:   Troponin I (High Sensitivity) 356 (*)    All other components within normal limits  RESP PANEL BY RT-PCR (FLU A&B, COVID) ARPGX2  CULTURE, BLOOD (ROUTINE X 2)  CULTURE, BLOOD (ROUTINE X 2)  LACTIC ACID, PLASMA  PROCALCITONIN  TRIGLYCERIDES  LACTIC ACID, PLASMA    EKG EKG Interpretation  Date/Time:  Saturday June 08 2020 13:25:52 EST Ventricular Rate:  94 PR Interval:  212 QRS Duration: 102 QT Interval:  380 QTC Calculation: 475 R Axis:   77 Text Interpretation: Sinus rhythm with 1st degree A-V block Possible Anterior infarct , age undetermined Abnormal ECG improved t wave inversions from prior 11/21 Confirmed by Aletta Edouard [phone]) on 06/08/2020 1:35:27 PM   Radiology DG Chest Portable 1 View  Result Date: 06/08/2020 CLINICAL DATA:  73 year old male with shortness of breath and chest pain EXAM: PORTABLE CHEST 1 VIEW COMPARISON:  08/05/2012 FINDINGS: Mild cardiomegaly, unchanged. Atherosclerotic calcification of the aortic arch. Aortic valve prosthesis  in place. Scattered diffuse patchy pulmonary opacities with a basal predominance. No significant pleural effusion. No pneumothorax. Median sternotomy wires remain in place. No acute osseous abnormality. IMPRESSION: Diffuse bilateral mid lung and lower lobe predominant patchy pulmonary opacities, most compatible with multifocal pneumonia in the setting of fever. Pulmonary edema could appear similarly. Electronically Signed   By: Ruthann Cancer MD   On: 06/08/2020 14:10    Procedures .Critical Care Performed by: Domenic Moras, PA-C Authorized by: Domenic Moras, PA-C   Critical care provider statement:    Critical care time (minutes):  45   Critical care was time spent personally by me on the following activities:  Discussions with consultants, evaluation of patient's response to treatment, examination of patient, ordering and performing treatments and interventions, ordering and review of laboratory studies, ordering and review of radiographic studies, pulse oximetry, re-evaluation of patient's condition, obtaining history from patient or surrogate and review of old charts   (including critical care time)  Medications Ordered in ED Medications  0.9 %  sodium chloride infusion (1,000 mLs Intravenous New Bag/Given (Non-Interop) 06/08/20 1717)  vancomycin (VANCOREADY) IVPB 2000 mg/400 mL (2,000 mg Intravenous New Bag/Given 06/08/20 1807)  furosemide (LASIX)  injection 40 mg (has no administration in time range)  acetaminophen (TYLENOL) tablet 650 mg (650 mg Oral Given 06/08/20 1733)  ceFEPIme (MAXIPIME) 2 g in sodium chloride 0.9 % 100 mL IVPB (0 g Intravenous Stopped 06/08/20 1805)  furosemide (LASIX) injection 40 mg (40 mg Intravenous Given 06/08/20 1856)    ED Course  I have reviewed the triage vital signs and the nursing notes.  Pertinent labs & imaging results that were available during my care of the patient were reviewed by me and considered in my medical decision making (see chart for  details).    MDM Rules/Calculators/A&P                          BP 131/64   Pulse 78   Temp 99.2 F (37.3 C) (Oral)   Resp (!) 22   Ht 5\' 9"  (1.753 m)   Wt 117.9 kg   SpO2 94%   BMI 38.40 kg/m   Final Clinical Impression(s) / ED Diagnoses Final diagnoses:  Acute on chronic congestive heart failure, unspecified heart failure type (HCC)  Fever of unknown origin  Demand ischemia ([place])    Rx / DC Orders ED Discharge Orders    None     4:40 PM Patient has been vaccinated for COVID-19 presenting with fever, dry heaving, as well as increased shortness of breath.  Patient also recently had peritoneal dialysis access as well as left AV fistula access which was placed very recently.  Was also given iron infusion recently and has been feeling well since.  He appears to be fluid overload with wet lung as well as peripheral edema to his bilateral lower extremities.  Dialysis access sites with normal..  He is however febrile, and is hypoxic with O2 sats 86% on room air, will place on supplemental oxygen.  Patient started on broad-spectrum antibiotic however his shortness of breath may be secondary to pulmonary edema.  Care discussed with Dr. Sabra Heck.   6:24 PM BNP is mildly elevated at 2673.  This reflects signs of CHF.  Troponin is also elevated at 356, likely reflecting demand ischemia.  His Covid test is negative.  Normal lactic acid.  Renal function shows slightly worsened function with creatinine now at 5.53.  7:15 PM Appreciate consultation from [facility] hospitalist, Dr. Waldron Labs recommend consultation to nephrology to get further guidance as patient recently started on peritoneal/dialysis.  Question include plan to transfer patient over to [facility] for dialysis as well as whether patient will benefit from a chest CTA to rule out PE due to an elevated D-dimer.  Patient is currently under training for peritoneal dialysis, on day 5 but have not started peritoneal dialysis yet  7:32  PM Appreciate consultation from on call nephrologist Dr. Candiss Norse who recommend transfering to [facility] for further management, and also recommend VQ scan to r/o PE instead of chest CTA as pt still making urine.  He also request given an additional 40mg  of Lasix.  I discussed this with Dr. Waldron Labs who agrees and will help facility admission/transfer.  Pt is currently stable and aware of plan.  Pt is receiving broad spectrum abx.    [name] was evaluated in Emergency Department on 06/08/2020 for the symptoms described in the history of present illness. He was evaluated in the context of the global COVID-19 pandemic, which necessitated consideration that the patient might be at risk for infection with the SARS-CoV-2 virus that causes COVID-19. Institutional protocols and  algorithms that pertain to the evaluation of patients at risk for COVID-19 are in a state of rapid change based on information released by regulatory bodies including the CDC and federal and state organizations. These policies and algorithms were followed during the patient's care in the ED.    Domenic Moras, PA-C 06/08/20 1934    Domenic Moras, PA-C 06/08/20 1936    Noemi Chapel, MD 06/10/20 [phone]

## 2020-06-08 NOTE — ED Provider Notes (Signed)
Medical screening examination/treatment/procedure(s) were conducted as a shared visit with non-physician practitioner(s) and myself.  I personally evaluated the patient during the encounter.  Clinical Impression:   Final diagnoses:  Acute on chronic congestive heart failure, unspecified heart failure type (HCC)  Fever of unknown origin  Demand ischemia [facility])   This patient is a 74 year old male who is ill-appearing, he has recently had peritoneal dialysis access as well as a fistula in his left upper extremity placed, shortly thereafter he received an iron infusion for anemia and then became very ill for 3 or 4 days.  During that time he had lots of nausea vomiting very little appetite and then started to develop a fever.  Over the last few days he has had increasing shortness of breath with a cough, some sputum, temperature rising over 101 and on arrival oxygen of 86%.  The patient is able to speak in full sentences, he does have some scattered rales diffusely but does not appear to be in respiratory distress.  He has bilateral edema of his legs which is 2+ pitting and symmetrical below the knees.  His abdomen is completely soft and nontender, the surgical scars are healing up very well and he has no tenderness.  We will need a septic work-up, likely needs to be admitted, he has diffuse pulmonary edema based on what I have seen personally on his x-ray.      Noemi Chapel, MD 06/10/20 [phone]

## 2020-06-08 NOTE — ED Notes (Signed)
Date and time results received: 06/08/20 1817 (use smartphrase ".now" to insert current time)  Test:Troponin Critical Value: 356 Name of Provider Notified: Domenic Moras PA  Orders Received? Or Actions Taken?: NA

## 2020-06-08 NOTE — ED Notes (Signed)
ED TO INPATIENT HANDOFF REPORT  ED Nurse Name and Phone #: Angelica Pou [phone]  S Name/Age/Gender [name] 74 y.o. male Room/Bed: APA07/APA07  Code Status   Code Status: Prior  Home/SNF/Other Home Patient oriented to: self, place, time and situation Is this baseline? Yes   Triage Complete: Triage complete  Chief Complaint Fever [R50.9]  Triage Note Pt with fever and SOB for past 3 days, productive cough with blood at times.   Dr. Juel Burrow office did a rapid Covid test and result was negative.   RA sats on 81% in triage.     Allergies No Known Allergies  Level of Care/Admitting Diagnosis ED Disposition    ED Disposition Condition [facility] Area: [place] [[id number]]  Level of Care: Telemetry Medical [104]  May admit patient to [facility] or [facility] if equivalent level of care is available:: No  Covid Evaluation: Confirmed COVID Negative  Diagnosis: Fever [454098]  Admitting Physician: Manfred Shirts  Attending Physician: Waldron Labs, DAWOOD S [[id number]]  Estimated length of stay: 3 - 4 days  Certification:: I certify this patient will need inpatient services for at least 2 midnights       B Medical/Surgery History Past Medical History:  Diagnosis Date  . Aortic stenosis   . Arthritis   . Atherosclerotic heart disease of native coronary artery without angina pectoris   . CAD (coronary artery disease)   . Chronic obstructive pulmonary disease, unspecified ([place])   . COPD (chronic obstructive pulmonary disease) ([place])   . Depression   . Essential (primary) hypertension   . Gout, unspecified   . Heart murmur   . Hemorrhoid   . Hyperlipidemia, unspecified   . Hypertension   . Hypothyroidism   . Hypothyroidism, unspecified   . Kidney stones   . Major depressive disorder, single episode, unspecified   . Morbid (severe) obesity due to excess calories ([place])   . Nonrheumatic aortic (valve) stenosis   . Orthostatic  hypotension   . Syncope   . Thyroid disease    Past Surgical History:  Procedure Laterality Date  . ANGIOPASTY    . AORTIC VALVE REPLACEMENT  07/11/2012   Procedure: AORTIC VALVE REPLACEMENT (AVR);  Surgeon: Gaye Pollack, MD;  Location: [place];  Service: Open Heart Surgery;  Laterality: N/A;  . AV FISTULA PLACEMENT Left 05/30/2020   Procedure: ARTERIOVENOUS (AV) FISTULA CREATION LEFT;  Surgeon: [name], MD;  Location: [facility];  Service: Vascular;  Laterality: Left;  . CARDIAC CATHETERIZATION    . CAROTID ENDARTERECTOMY    . CORONARY ARTERY BYPASS GRAFT  07/11/2012   Procedure: CORONARY ARTERY BYPASS GRAFTING (CABG);  Surgeon: Gaye Pollack, MD;  Location: [place];  Service: Open Heart Surgery;  Laterality: N/A;  CABG x one,  using right leg greater saphenous vein harvested endoscopically  . INTRAOPERATIVE TRANSESOPHAGEAL ECHOCARDIOGRAM  07/11/2012   Procedure: INTRAOPERATIVE TRANSESOPHAGEAL ECHOCARDIOGRAM;  Surgeon: Gaye Pollack, MD;  Location: [facility] OR;  Service: Open Heart Surgery;  Laterality: N/A;  . JOINT REPLACEMENT       A IV Location/Drains/Wounds Patient Lines/Drains/Airways Status    Active Line/Drains/Airways    Name Placement date Placement time Site Days   Peripheral IV 06/08/20 Right Antecubital 06/08/20  1720  Antecubital  less than 1   Peripheral IV 06/08/20 Right Hand 06/08/20  1722  Hand  less than 1   Fistula / Graft Left Forearm Arteriovenous fistula 05/30/20  1007  Forearm  9   Incision 07/11/12 Other (Comment) 07/11/12  0948  -- 2889   Incision 07/11/12 Chest 07/11/12  1013  -- 2889   Incision 07/11/12 Leg Right 07/11/12  1013  -- 2889   Incision (Closed) 02/07/20 Flank Left 02/07/20  0851  -- 122   Incision (Closed) 05/30/20 Arm Left 05/30/20  0947  -- 9          Intake/Output Last 24 hours  Intake/Output Summary (Last 24 hours) at 06/08/2020 1945 Last data filed at 06/08/2020 1805 Gross per 24 hour  Intake 100 ml  Output --  Net 100 ml     Labs/Imaging Results for orders placed or performed during the hospital encounter of 06/08/20 (from the past 48 hour(s))  CBC with Differential     Status: Abnormal   Collection Time: 06/08/20  1:32 PM  Result Value Ref Range   WBC 22.3 (H) 4.0 - 10.5 K/uL   RBC 2.76 (L) 4.22 - 5.81 MIL/uL   Hemoglobin 8.8 (L) 13.0 - 17.0 g/dL   HCT 27.1 (L) 39.0 - 52.0 %   MCV 98.2 80.0 - 100.0 fL   MCH 31.9 26.0 - 34.0 pg   MCHC 32.5 30.0 - 36.0 g/dL   RDW 14.2 11.5 - 15.5 %   Platelets 224 150 - 400 K/uL   nRBC 0.0 0.0 - 0.2 %   Neutrophils Relative % 91 %   Neutro Abs 20.2 (H) 1.7 - 7.7 K/uL   Lymphocytes Relative 5 %   Lymphs Abs 1.1 0.7 - 4.0 K/uL   Monocytes Relative 3 %   Monocytes Absolute 0.7 0.1 - 1.0 K/uL   Eosinophils Relative 0 %   Eosinophils Absolute 0.0 0.0 - 0.5 K/uL   Basophils Relative 0 %   Basophils Absolute 0.1 0.0 - 0.1 K/uL   Immature Granulocytes 1 %   Abs Immature Granulocytes 0.21 (H) 0.00 - 0.07 K/uL    Comment: Performed at [facility], [address], [place], [place] [address]  Troponin I (High Sensitivity)     Status: Abnormal   Collection Time: 06/08/20  3:32 PM  Result Value Ref Range   Troponin I (High Sensitivity) 356 (HH) <18 ng/L    Comment: CRITICAL RESULT CALLED TO, READ BACK BY AND VERIFIED WITH: H CRAWFORD AT 1817 ON 06/08/2020 BY MOSLEY,J  (NOTE) Elevated high sensitivity troponin I (hsTnI) values and significant  changes across serial measurements may suggest ACS but many other  chronic and acute conditions are known to elevate hsTnI results.  Refer to the Links section for chest pain algorithms and additional  guidance. Performed at [facility], [address] [place], [place] [address]   Lactic acid, plasma     Status: None   Collection Time: 06/08/20  4:21 PM  Result Value Ref Range   Lactic Acid, Venous 1.1 0.5 - 1.9 mmol/L    Comment: Performed at [facility], [address], [place], [place] [address]  Comprehensive metabolic  panel     Status: Abnormal   Collection Time: 06/08/20  4:21 PM  Result Value Ref Range   Sodium 135 135 - 145 mmol/L   Potassium 3.9 3.5 - 5.1 mmol/L   Chloride 101 98 - 111 mmol/L   CO2 21 (L) 22 - 32 mmol/L   Glucose, Bld 146 (H) 70 - 99 mg/dL    Comment: Glucose reference range applies only to samples taken after fasting for at least 8 hours.   BUN 71 (H) 8 - 23  mg/dL   Creatinine, Ser 5.53 (H) 0.61 - 1.24 mg/dL   Calcium 8.6 (L) 8.9 - 10.3 mg/dL   Total Protein 6.9 6.5 - 8.1 g/dL   Albumin 3.1 (L) 3.5 - 5.0 g/dL   AST 24 15 - 41 U/L   ALT 14 0 - 44 U/L   Alkaline Phosphatase 56 38 - 126 U/L   Total Bilirubin 0.6 0.3 - 1.2 mg/dL   GFR, Estimated 10 (L) >60 mL/min    Comment: (NOTE) Calculated using the CKD-EPI Creatinine Equation (2021)    Anion gap 13 5 - 15    Comment: Performed at [facility], [address], [place], [place] [address]  D-dimer, quantitative     Status: Abnormal   Collection Time: 06/08/20  4:21 PM  Result Value Ref Range   D-Dimer, Quant 2.67 (H) 0.00 - 0.50 ug/mL-FEU    Comment: (NOTE) At the manufacturer cut-off value of 0.5 g/mL FEU, this assay has a negative predictive value of 95-100%.This assay is intended for use in conjunction with a clinical pretest probability (PTP) assessment model to exclude pulmonary embolism (PE) and deep venous thrombosis (DVT) in outpatients suspected of PE or DVT. Results should be correlated with clinical presentation. Performed at [facility], [address] [place], [place] [address]   Procalcitonin     Status: None   Collection Time: 06/08/20  4:21 PM  Result Value Ref Range   Procalcitonin 1.26 ng/mL    Comment:        Interpretation: PCT > 0.5 ng/mL and <= 2 ng/mL: Systemic infection (sepsis) is possible, but other conditions are known to elevate PCT as well. (NOTE)       Sepsis PCT Algorithm           Lower Respiratory Tract                                      Infection PCT Algorithm     ----------------------------     ----------------------------         PCT < 0.25 ng/mL                PCT < 0.10 ng/mL          Strongly encourage             Strongly discourage   discontinuation of antibiotics    initiation of antibiotics    ----------------------------     -----------------------------       PCT 0.25 - 0.50 ng/mL            PCT 0.10 - 0.25 ng/mL               OR       >80% decrease in PCT            Discourage initiation of                                            antibiotics      Encourage discontinuation           of antibiotics    ----------------------------     -----------------------------         PCT >= 0.50 ng/mL              PCT 0.26 - 0.50 ng/mL  AND       <80% decrease in PCT             Encourage initiation of                                             antibiotics       Encourage continuation           of antibiotics    ----------------------------     -----------------------------        PCT >= 0.50 ng/mL                  PCT > 0.50 ng/mL               AND         increase in PCT                  Strongly encourage                                      initiation of antibiotics    Strongly encourage escalation           of antibiotics                                     -----------------------------                                           PCT <= 0.25 ng/mL                                                 OR                                        > 80% decrease in PCT                                      Discontinue / Do not initiate                                             antibiotics  Performed at [facility], [address], [place], [place] [address]   Lactate dehydrogenase     Status: Abnormal   Collection Time: 06/08/20  4:21 PM  Result Value Ref Range   LDH 204 (H) 98 - 192 U/L    Comment: Performed at [facility], [address], [place], [place] [address]  Ferritin     Status: Abnormal   Collection Time: 06/08/20   4:21 PM  Result Value Ref Range   Ferritin 386 (H) 24 - 336 ng/mL    Comment: Performed at [facility], [address]  [place], [place] [address]  Triglycerides     Status: None   Collection Time: 06/08/20  4:21 PM  Result Value Ref Range   Triglycerides 80 <150 mg/dL    Comment: Performed at [facility], [address], [place], [place] [address]  Fibrinogen     Status: Abnormal   Collection Time: 06/08/20  4:21 PM  Result Value Ref Range   Fibrinogen 718 (H) 210 - 475 mg/dL    Comment: Performed at [facility], [address], [place], [place] [address]  C-reactive protein     Status: Abnormal   Collection Time: 06/08/20  4:21 PM  Result Value Ref Range   CRP 16.7 (H) <1.0 mg/dL    Comment: Performed at [facility], [address] [place], [place] [address]  Brain natriuretic peptide     Status: Abnormal   Collection Time: 06/08/20  4:47 PM  Result Value Ref Range   B Natriuretic Peptide 2,673.0 (H) 0.0 - 100.0 pg/mL    Comment: Performed at [facility], [address] [place], [place] [address]  Resp Panel by RT-PCR (Flu A&B, Covid) Nasopharyngeal Swab     Status: None   Collection Time: 06/08/20  5:05 PM   Specimen: Nasopharyngeal Swab; Nasopharyngeal(NP) swabs in vial transport medium  Result Value Ref Range   SARS Coronavirus 2 by RT PCR NEGATIVE NEGATIVE    Comment: (NOTE) SARS-CoV-2 target nucleic acids are NOT DETECTED.  The SARS-CoV-2 RNA is generally detectable in upper respiratory specimens during the acute phase of infection. The lowest concentration of SARS-CoV-2 viral copies this assay can detect is 138 copies/mL. A negative result does not preclude SARS-Cov-2 infection and should not be used as the sole basis for treatment or other patient management decisions. A negative result may occur with  improper specimen collection/handling, submission of specimen other than nasopharyngeal swab, presence of viral mutation(s) within the areas targeted by this assay,  and inadequate number of viral copies(<138 copies/mL). A negative result must be combined with clinical observations, patient history, and epidemiological information. The expected result is Negative.  Fact Sheet for Patients:  EntrepreneurPulse.com.au  Fact Sheet for Healthcare Providers:  IncredibleEmployment.be  This test is no t yet approved or cleared by the [place] FDA and  has been authorized for detection and/or diagnosis of SARS-CoV-2 by FDA under an Emergency Use Authorization (EUA). This EUA will remain  in effect (meaning this test can be used) for the duration of the COVID-19 declaration under Section 564(b)(1) of the Act, 21 U.S.C.section 360bbb-3(b)(1), unless the authorization is terminated  or revoked sooner.       Influenza A by PCR NEGATIVE NEGATIVE   Influenza B by PCR NEGATIVE NEGATIVE    Comment: (NOTE) The Xpert Xpress SARS-CoV-2/FLU/RSV plus assay is intended as an aid in the diagnosis of influenza from Nasopharyngeal swab specimens and should not be used as a sole basis for treatment. Nasal washings and aspirates are unacceptable for Xpert Xpress SARS-CoV-2/FLU/RSV testing.  Fact Sheet for Patients: EntrepreneurPulse.com.au  Fact Sheet for Healthcare Providers: IncredibleEmployment.be  This test is not yet approved or cleared by the [place] FDA and has been authorized for detection and/or diagnosis of SARS-CoV-2 by FDA under an Emergency Use Authorization (EUA). This EUA will remain in effect (meaning this test can be used) for the duration of the COVID-19 declaration under Section 564(b)(1) of the Act, 21 U.S.C. section 360bbb-3(b)(1), unless the authorization is terminated or revoked.  Performed at [facility], [address] [place],  [place] [address]   Urinalysis, Routine w reflex microscopic Urine, Random     Status: Abnormal   Collection Time: 06/08/20  6:59  PM  Result Value Ref Range   Color, Urine YELLOW YELLOW   APPearance HAZY (A) CLEAR   Specific Gravity, Urine 1.014 1.005 - 1.030   pH 5.0 5.0 - 8.0   Glucose, UA 50 (A) NEGATIVE mg/dL   Hgb urine dipstick NEGATIVE NEGATIVE   Bilirubin Urine NEGATIVE NEGATIVE   Ketones, ur NEGATIVE NEGATIVE mg/dL   Protein, ur >=300 (A) NEGATIVE mg/dL   Nitrite NEGATIVE NEGATIVE   Leukocytes,Ua NEGATIVE NEGATIVE   RBC / HPF 0-5 0 - 5 RBC/hpf   WBC, UA 0-5 0 - 5 WBC/hpf   Bacteria, UA NONE SEEN NONE SEEN   Squamous Epithelial / LPF 0-5 0 - 5    Comment: Performed at [facility], [address], [place], [place] [address]   DG Chest Portable 1 View  Result Date: 06/08/2020 CLINICAL DATA:  74 year old male with shortness of breath and chest pain EXAM: PORTABLE CHEST 1 VIEW COMPARISON:  08/05/2012 FINDINGS: Mild cardiomegaly, unchanged. Atherosclerotic calcification of the aortic arch. 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 Scattered diffuse patchy pulmonary opacities with a basal predominance. No significant pleural effusion. No pneumothorax. Median sternotomy wires remain in place. No acute osseous abnormality. IMPRESSION: Diffuse bilateral mid lung and lower lobe predominant patchy pulmonary opacities, most compatible with multifocal pneumonia in the setting of fever. Pulmonary edema could appear similarly. Electronically Signed   By: Ruthann Cancer MD   On: 06/08/2020 14:10    Pending Labs Unresulted Labs (From admission, onward)          Start     Ordered   06/08/20 1621  Lactic acid, plasma  Now then every 2 hours,   STAT      06/08/20 1621   06/08/20 1621  Blood Culture (routine x 2)  BLOOD CULTURE X 2,   STAT      06/08/20 1621          Vitals/Pain Today's Vitals   06/08/20 1800 06/08/20 1830 06/08/20 1900 06/08/20 1930  BP: 131/64 124/64 127/63 (!) 143/79  Pulse: 78 80 80 81  Resp: (!) 22 (!) 22 (!) 26 20  Temp:      TempSrc:      SpO2: 94% 96% 93% 94%  Weight:      Height:       PainSc:        Isolation Precautions Airborne and Contact precautions  Medications Medications  0.9 %  sodium chloride infusion (1,000 mLs Intravenous New Bag/Given (Non-Interop) 06/08/20 1717)  vancomycin (VANCOREADY) IVPB 2000 mg/400 mL (2,000 mg Intravenous New Bag/Given 06/08/20 1807)  acetaminophen (TYLENOL) tablet 650 mg (650 mg Oral Given 06/08/20 1733)  ceFEPIme (MAXIPIME) 2 g in sodium chloride 0.9 % 100 mL IVPB (0 g Intravenous Stopped 06/08/20 1805)  furosemide (LASIX) injection 40 mg (40 mg Intravenous Given 06/08/20 1856)  furosemide (LASIX) injection 40 mg (40 mg Intravenous Given 06/08/20 1936)    Mobility walks with device High fall risk   Focused Assessments    R Recommendations: See Admitting Provider Note  Report given to:   Additional Notes:

## 2020-06-09 ENCOUNTER — Other Ambulatory Visit: Payer: Self-pay

## 2020-06-09 ENCOUNTER — Encounter (HOSPITAL_COMMUNITY): Payer: Self-pay | Admitting: Internal Medicine

## 2020-06-09 DIAGNOSIS — Z96653 Presence of artificial knee joint, bilateral: Secondary | ICD-10-CM

## 2020-06-09 DIAGNOSIS — E039 Hypothyroidism, unspecified: Secondary | ICD-10-CM

## 2020-06-09 DIAGNOSIS — R6520 Severe sepsis without septic shock: Secondary | ICD-10-CM

## 2020-06-09 DIAGNOSIS — J9621 Acute and chronic respiratory failure with hypoxia: Secondary | ICD-10-CM

## 2020-06-09 DIAGNOSIS — J189 Pneumonia, unspecified organism: Secondary | ICD-10-CM

## 2020-06-09 DIAGNOSIS — R600 Localized edema: Secondary | ICD-10-CM

## 2020-06-09 DIAGNOSIS — Z951 Presence of aortocoronary bypass graft: Secondary | ICD-10-CM

## 2020-06-09 DIAGNOSIS — J449 Chronic obstructive pulmonary disease, unspecified: Secondary | ICD-10-CM

## 2020-06-09 DIAGNOSIS — B952 Enterococcus as the cause of diseases classified elsewhere: Secondary | ICD-10-CM

## 2020-06-09 DIAGNOSIS — Z87891 Personal history of nicotine dependence: Secondary | ICD-10-CM

## 2020-06-09 DIAGNOSIS — I120 Hypertensive chronic kidney disease with stage 5 chronic kidney disease or end stage renal disease: Secondary | ICD-10-CM

## 2020-06-09 DIAGNOSIS — R509 Fever, unspecified: Secondary | ICD-10-CM

## 2020-06-09 DIAGNOSIS — A4181 Sepsis due to Enterococcus: Principal | ICD-10-CM

## 2020-06-09 DIAGNOSIS — F32A Depression, unspecified: Secondary | ICD-10-CM

## 2020-06-09 DIAGNOSIS — N185 Chronic kidney disease, stage 5: Secondary | ICD-10-CM

## 2020-06-09 DIAGNOSIS — Z992 Dependence on renal dialysis: Secondary | ICD-10-CM

## 2020-06-09 DIAGNOSIS — I2510 Atherosclerotic heart disease of native coronary artery without angina pectoris: Secondary | ICD-10-CM

## 2020-06-09 DIAGNOSIS — E785 Hyperlipidemia, unspecified: Secondary | ICD-10-CM

## 2020-06-09 DIAGNOSIS — Z952 Presence of prosthetic heart valve: Secondary | ICD-10-CM

## 2020-06-09 LAB — BASIC METABOLIC PANEL
Anion gap: 17 — ABNORMAL HIGH (ref 5–15)
BUN: 75 mg/dL — ABNORMAL HIGH (ref 8–23)
CO2: 18 mmol/L — ABNORMAL LOW (ref 22–32)
Calcium: 8.8 mg/dL — ABNORMAL LOW (ref 8.9–10.3)
Chloride: 104 mmol/L (ref 98–111)
Creatinine, Ser: 5.72 mg/dL — ABNORMAL HIGH (ref 0.61–1.24)
GFR, Estimated: 10 mL/min — ABNORMAL LOW (ref >60)
Glucose, Bld: 188 mg/dL — ABNORMAL HIGH (ref 70–99)
Potassium: 3.7 mmol/L (ref 3.5–5.1)
Sodium: 139 mmol/L (ref 135–145)

## 2020-06-09 LAB — CBC
HCT: 25.4 % — ABNORMAL LOW (ref 39.0–52.0)
Hemoglobin: 8 g/dL — ABNORMAL LOW (ref 13.0–17.0)
MCH: 30.8 pg (ref 26.0–34.0)
MCHC: 31.5 g/dL (ref 30.0–36.0)
MCV: 97.7 fL (ref 80.0–100.0)
Platelets: 192 10*3/uL (ref 150–400)
RBC: 2.6 MIL/uL — ABNORMAL LOW (ref 4.22–5.81)
RDW: 14.2 % (ref 11.5–15.5)
WBC: 18.5 10*3/uL — ABNORMAL HIGH (ref 4.0–10.5)
nRBC: 0 % (ref 0.0–0.2)

## 2020-06-09 LAB — BLOOD CULTURE ID PANEL (REFLEXED) - BCID2

## 2020-06-09 LAB — COMPREHENSIVE METABOLIC PANEL
ALT: 13 U/L (ref 0–44)
AST: 27 U/L (ref 15–41)
Albumin: 2.4 g/dL — ABNORMAL LOW (ref 3.5–5.0)
Alkaline Phosphatase: 49 U/L (ref 38–126)
Anion gap: 17 — ABNORMAL HIGH (ref 5–15)
BUN: 79 mg/dL — ABNORMAL HIGH (ref 8–23)
CO2: 17 mmol/L — ABNORMAL LOW (ref 22–32)
Calcium: 8.6 mg/dL — ABNORMAL LOW (ref 8.9–10.3)
Chloride: 104 mmol/L (ref 98–111)
Creatinine, Ser: 6.06 mg/dL — ABNORMAL HIGH (ref 0.61–1.24)
GFR, Estimated: 9 mL/min — ABNORMAL LOW (ref >60)
Glucose, Bld: 190 mg/dL — ABNORMAL HIGH (ref 70–99)
Potassium: 3.5 mmol/L (ref 3.5–5.1)
Sodium: 138 mmol/L (ref 135–145)
Total Bilirubin: 0.6 mg/dL (ref 0.3–1.2)
Total Protein: 6.1 g/dL — ABNORMAL LOW (ref 6.5–8.1)

## 2020-06-09 MED ORDER — SODIUM CHLORIDE 0.9 % IV SOLN
2.0000 g | Freq: Two times a day (BID) | INTRAVENOUS | Status: DC
  Administered 2020-06-09 – 2020-06-14 (×10): 2 g via INTRAVENOUS
  Filled 2020-06-09 (×10): qty 20

## 2020-06-09 MED ORDER — SODIUM BICARBONATE 650 MG PO TABS: 1300.0000 mg | ORAL_TABLET | Freq: Three times a day (TID) | ORAL | Status: DC

## 2020-06-09 MED ORDER — FUROSEMIDE 10 MG/ML IJ SOLN
40.0000 mg | Freq: Two times a day (BID) | INTRAMUSCULAR | Status: DC
  Administered 2020-06-09 – 2020-06-14 (×11): 40 mg via INTRAVENOUS
  Filled 2020-06-09 (×11): qty 4

## 2020-06-09 MED ORDER — LEVOTHYROXINE SODIUM 75 MCG PO TABS
175.0000 ug | ORAL_TABLET | Freq: Every day | ORAL | Status: DC
  Administered 2020-06-09 – 2020-06-14 (×6): 175 ug via ORAL
  Filled 2020-06-09 (×6): qty 1

## 2020-06-09 MED ORDER — SODIUM CHLORIDE 0.9 % IV SOLN
2.0000 g | Freq: Two times a day (BID) | INTRAVENOUS | Status: DC
  Administered 2020-06-09 – 2020-06-14 (×10): 2 g via INTRAVENOUS
  Filled 2020-06-09 (×3): qty 2000
  Filled 2020-06-09: qty 2
  Filled 2020-06-09 (×3): qty 2000
  Filled 2020-06-09: qty 2
  Filled 2020-06-09 (×4): qty 2000
  Filled 2020-06-09 (×2): qty 2
  Filled 2020-06-09: qty 2000

## 2020-06-09 MED ORDER — IPRATROPIUM-ALBUTEROL 0.5-2.5 (3) MG/3ML IN SOLN: 3.0000 mL | Freq: Four times a day (QID) | RESPIRATORY_TRACT | Status: DC

## 2020-06-09 MED ORDER — SODIUM CHLORIDE 0.9 % IV SOLN
1.0000 g | INTRAVENOUS | Status: DC
  Filled 2020-06-09: qty 1

## 2020-06-09 MED ORDER — IPRATROPIUM-ALBUTEROL 0.5-2.5 (3) MG/3ML IN SOLN
3.0000 mL | Freq: Four times a day (QID) | RESPIRATORY_TRACT | Status: DC
  Administered 2020-06-10 – 2020-06-11 (×5): 3 mL via RESPIRATORY_TRACT
  Filled 2020-06-09 (×8): qty 3

## 2020-06-09 MED ORDER — VITAMIN D 25 MCG (1000 UNIT) PO TABS
2000.0000 [IU] | ORAL_TABLET | Freq: Every day | ORAL | Status: DC
  Administered 2020-06-09 – 2020-06-13 (×5): 2000 [IU] via ORAL
  Filled 2020-06-09 (×5): qty 2

## 2020-06-09 MED ORDER — SODIUM BICARBONATE 650 MG PO TABS
1300.0000 mg | ORAL_TABLET | Freq: Three times a day (TID) | ORAL | Status: DC
  Administered 2020-06-09 – 2020-06-14 (×12): 1300 mg via ORAL
  Filled 2020-06-09 (×13): qty 2

## 2020-06-09 MED ORDER — ORAL CARE MOUTH RINSE
15.0000 mL | Freq: Two times a day (BID) | OROMUCOSAL | Status: DC
  Administered 2020-06-09 – 2020-06-14 (×9): 15 mL via OROMUCOSAL

## 2020-06-09 MED ORDER — VANCOMYCIN VARIABLE DOSE PER UNSTABLE RENAL FUNCTION (PHARMACIST DOSING): Status: DC

## 2020-06-09 NOTE — Consult Note (Signed)
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 Marlise Eves, now with Dr. Juleen China at home therapies. Cr 5.2 on 06/12/2020. Notes reviewed -Currently undergoing training for PD in [place].  Given positive blood cultures, will obtain PD fluid studies and culture either today or tomorrow -Hopeful that he does not need to start dialysis while he is in-house.  If he were to start, may need hemodialysis via catheter as his fistula has not matured -Continue to monitor daily Cr, Dose meds for GFR<15 -Monitor Daily I/Os, Daily weight  -Maintain MAP>65 for optimal renal perfusion.  -Agree with holding ACE-I, avoid further nephrotoxins including NSAIDS, Morphine.  Unless absolutely necessary, avoid CT with contrast and/or MRI with gadolinium.     AHRF -s/p lasix. On 40mg  IV BID currently. With his degree of kidney function, may need much more than this. Was taking 80mg  PO BID. Low threshold to increase. In the long term, I would recommend that he be switched to torsemide given history of nephrotic range proteinuria -possible multifocal pneumonia. Also on cefepime -also with COPD exacerbation? On steroids  GPC Bacteremia -On vancomycin,  pharmacy on board. Would be cautious with this given his baseline kidney function, monitor troughs -PD fluid studies/culture as above -AVF looks good, not suspecting this is a source  Hypertension: Resume home antihypertensives  Metabolic acidosis (+AG), secondary to advanced CKD -increase nahco3 to 1300mg  tid  Secondary hyperparathyroidism. CKD-MBD -c/w calcitriol, check phos and PTH  Anemia due to chronic kidney disease: -Transfuse for Hgb<7 g/dL -recently  received ESA and iron infusion last Wed  Access -PD cath c/d/i -Left radiocephalic AV fistula placed by Dr. Donnetta Hutching on 12/2: c/d/i with +B/T  Recommendations conveyed to primary service.    [facility] 06/09/2020 1:39 PM   _____________________________________________________________________________________   History of Present Illness: [name] is a/an 74 y.o. male with a past medical history of CKD 5, COPD, CAD, hypertension, depression, hypothyroidism, hyperlipidemia, obesity, MGUS who presents to [facility] with shortness of breath, dry heaving, wheezing.  Initially presented to [facility] but transferred over here after my discussion with the ER. He is undergoing training for PD and has had 5 sessions thus far.  Net -1600 cc this last Friday with PD training Was hypoxic in the ER setting up to the mid 10s on room air.  Had diffuse multifocal opacities which could be related to volume overload versus infectious process.  Received 40 of Lasix and after my discussion with the ER received another 40 of Lasix for a total of 80 mg.  Was also febrile with leukocytosis on presentation therefore started on broad-spectrum antibiotics.  Patient reports feeling significantly better as compared to yesterday.  Still making urine.  Denies any nausea/vomiting/dysgeusia, loss of appetite, chest pain, worsening swelling, dizziness. Wife at bedside.  Medications:  Current Facility-Administered Medications  Medication Dose Route Frequency Provider Last Rate Last Admin  . 0.9 %  sodium chloride infusion  1,000 mL Intravenous Continuous Elgergawy, Silver Huguenin, MD 100 mL/hr at 06/09/20 0546 1,000 mL at 06/09/20 0546  . acetaminophen (TYLENOL) tablet 650 mg  650 mg Oral Q6H PRN Elgergawy, Silver Huguenin, MD       Or  . acetaminophen (TYLENOL) suppository 650 mg  650 mg Rectal Q6H PRN Elgergawy, Silver Huguenin, MD      .  aspirin tablet 325 mg  325 mg Oral Daily Elgergawy, Silver Huguenin, MD   325 mg at  06/09/20 1057  . calcitRIOL (ROCALTROL) capsule 0.25 mcg  0.25 mcg Oral QODAY Elgergawy, Silver Huguenin, MD   0.25 mcg at 06/09/20 1058  . ceFEPIme (MAXIPIME) 1 g in sodium chloride 0.9 % 100 mL IVPB  1 g Intravenous Q24H Pham, Minh Q, RPH-CPP      . cholecalciferol (VITAMIN D3) tablet 2,000 Units  2,000 Units Oral QHS Elgergawy, Silver Huguenin, MD      . cholecalciferol (VITAMIN D3) tablet 4,000 Units  4,000 Units Oral Daily Elgergawy, Silver Huguenin, MD   4,000 Units at 06/09/20 1059  . finasteride (PROSCAR) tablet 5 mg  5 mg Oral Daily Elgergawy, Silver Huguenin, MD   5 mg at 06/09/20 1057  . furosemide (LASIX) injection 40 mg  40 mg Intravenous BID Marcell Anger, MD      . heparin injection 5,000 Units  5,000 Units Subcutaneous Q8H Elgergawy, Silver Huguenin, MD   5,000 Units at 06/09/20 0555  . ipratropium-albuterol (DUONEB) 0.5-2.5 (3) MG/3ML nebulizer solution 3 mL  3 mL Nebulization Q4H Elgergawy, Silver Huguenin, MD   3 mL at 06/09/20 1130  . latanoprost (XALATAN) 0.005 % ophthalmic solution 1 drop  1 drop Both Eyes QHS Elgergawy, Silver Huguenin, MD      . levothyroxine (SYNTHROID) tablet 175 mcg  175 mcg Oral Q0600 Elgergawy, Silver Huguenin, MD   175 mcg at 06/09/20 0552  . MEDLINE mouth rinse  15 mL Mouth Rinse BID Elgergawy, Silver Huguenin, MD      . methylPREDNISolone sodium succinate (SOLU-MEDROL) 125 mg/2 mL injection 80 mg  80 mg Intravenous Q8H Elgergawy, Silver Huguenin, MD   80 mg at 06/09/20 0554  . metoprolol tartrate (LOPRESSOR) tablet 25 mg  25 mg Oral BID Elgergawy, Silver Huguenin, MD   25 mg at 06/09/20 1059  . multivitamin (RENA-VIT) tablet 1 tablet  1 tablet Oral Daily Elgergawy, Silver Huguenin, MD   1 tablet at 06/09/20 1059  . omega-3 acid ethyl esters (LOVAZA) capsule 1 g  1 g Oral BID Elgergawy, Silver Huguenin, MD   1 g at 06/09/20 1058  . ondansetron (ZOFRAN) tablet 4 mg  4 mg Oral Q6H PRN Elgergawy, Silver Huguenin, MD       Or  . ondansetron (ZOFRAN) injection 4 mg  4 mg Intravenous Q6H PRN Elgergawy, Silver Huguenin, MD      . pantoprazole  (PROTONIX) EC tablet 40 mg  40 mg Oral Daily Elgergawy, Silver Huguenin, MD   40 mg at 06/09/20 1058  . senna-docusate (Senokot-S) tablet 1 tablet  1 tablet Oral BID Elgergawy, Silver Huguenin, MD   1 tablet at 06/09/20 1058  . sertraline (ZOLOFT) tablet 100 mg  100 mg Oral Daily Elgergawy, Silver Huguenin, MD   100 mg at 06/09/20 1059  . simvastatin (ZOCOR) tablet 40 mg  40 mg Oral QPM Elgergawy, Silver Huguenin, MD      . sodium bicarbonate tablet 650 mg  650 mg Oral TID Elgergawy, Silver Huguenin, MD   650 mg at 06/09/20 1056  . vancomycin variable dose per unstable renal function (pharmacist dosing)   Does not apply See admin instructions [name] Q, RPH-CPP         ALLERGIES Patient has no known allergies.  MEDICAL HISTORY Past Medical History:  Diagnosis Date  . Aortic stenosis   . Arthritis   . Atherosclerotic heart disease of native coronary artery without angina  pectoris   . CAD (coronary artery disease)   . Chronic obstructive pulmonary disease, unspecified ([place])   . COPD (chronic obstructive pulmonary disease) ([place])   . Depression   . Essential (primary) hypertension   . Gout, unspecified   . Heart murmur   . Hemorrhoid   . Hyperlipidemia, unspecified   . Hypertension   . Hypothyroidism   . Hypothyroidism, unspecified   . Kidney stones   . Major depressive disorder, single episode, unspecified   . Morbid (severe) obesity due to excess calories ([place])   . Nonrheumatic aortic (valve) stenosis   . Orthostatic hypotension   . Syncope   . Thyroid disease      SOCIAL HISTORY Social History   Socioeconomic History  . Marital status: Married    Spouse name: Not on file  . Number of children: 1  . Years of education: HS  . Highest education level: Not on file  Occupational History  . Occupation: Retired   Tobacco Use  . Smoking status: Former Smoker    Packs/day: 3.00    Years: 50.00    Pack years: 150.00    Types: Cigarettes    Quit date: 12/13/2010    Years since quitting: 9.4  . Smokeless  tobacco: Former Systems developer  . Tobacco comment: Electronic cigarette...USES INFREQUENTLY NOW  Vaping Use  . Vaping Use: Every day  . Start date: 02/01/2013  Substance and Sexual Activity  . Alcohol use: Yes    Comment: Rare  . Drug use: No  . Sexual activity: Not Currently  Other Topics Concern  . Not on file  Social History Narrative   Drinks about 3 cups of coffee a day, drinks about 2 sundrops a day    Social Determinants of Radio broadcast assistant Strain: Not on file  Food Insecurity: Not on file  Transportation Needs: Not on file  Physical Activity: Not on file  Stress: Not on file  Social Connections: Not on file  Intimate Partner Violence: Not on file     FAMILY HISTORY Family History  Problem Relation Age of Onset  . Heart disease Mother   . Heart disease Father   . Hypertension Father      Review of Systems: 12 systems reviewed Otherwise as per HPI, all other systems reviewed and negative  Physical Exam: Vitals:   06/09/20 1059 06/09/20 1130  BP: (!) 112/48   Pulse: 94   Resp: 19   Temp: 99.3 F (37.4 C)   SpO2: 95% 97%   No intake/output data recorded.  Intake/Output Summary (Last 24 hours) at 06/09/2020 1339 Last data filed at 06/09/2020 0446 Gross per 24 hour  Intake 500 ml  Output 1275 ml  Net -775 ml   General: well-appearing, no acute distress, sitting up in bed HEENT: anicteric sclera, oropharynx clear without lesions CV: regular rate, normal rhythm, no murmurs, no gallops, no rubs Lungs: Poor air exchange bilaterally, fine crackles bibasilar, normal work of breathing, bilateral chest expansion Abd: soft, non-tender, non-distended, PD cath site clean dry and intact Skin: no visible lesions or rashes MSK: Edema bilateral lower extremities, left upper extremity RC aVF with good bruit and thrill (incisions clear dry intact and healing) Psych: alert, engaged, appropriate mood and affect Neuro: normal speech, no gross focal deficits   Test  Results Reviewed Lab Results  Component Value Date   NA 138 06/09/2020   K 3.5 06/09/2020   CL 104 06/09/2020   CO2 17 (L) 06/09/2020   BUN  79 (H) 06/09/2020   CREATININE 6.06 (H) 06/09/2020   GLU 118 01/26/2019   CALCIUM 8.6 (L) 06/09/2020   ALBUMIN 2.4 (L) 06/09/2020     I have reviewed all relevant outside healthcare records related to the patient's kidney injury.

## 2020-06-09 NOTE — Progress Notes (Addendum)
Pharmacy Antibiotic Note  [name] is a 74 y.o. male admitted on 06/08/2020 with bacteremia.  Pharmacy has been consulted for vanc/cefepime dosing.  Pt is a new ESRD pt who presented to [facility] with sepsis. He recently had his PD and AVF catheter placed. Blood cultures have grown out GPC. BCID will be done once samples are here. Vanc and cefepime have been ordered for treatment. He got loading doses of vanc and cefepime at [facility] last PM.  Plan: Random vanc level in 1-2 days Redose when vanc level <15 Cefepime 1g IV q24 F/u with dialysis plan  Height: 5\' 9"  (175.3 cm) Weight: 120.1 kg (264 lb 12.4 oz) IBW/kg (Calculated) : 70.7  Temp (24hrs), Avg:99 F (37.2 C), Min:97.7 F (36.5 C), Max:101 F (38.3 C)  Recent Labs  Lab 06/08/20 1332 06/08/20 1621 06/08/20 1942  WBC 22.3*  --   --   CREATININE  --  5.53*  --   LATICACIDVEN  --  1.1 0.8    Estimated Creatinine Clearance: 14.8 mL/min (A) (by C-G formula based on SCr of 5.53 mg/dL (H)).    No Known Allergies  Antimicrobials this admission: 12/11 vanc>> 12/11 cefepime>>  Dose adjustments this admission:   Microbiology results: 12/11 blood>>GPC 12/11 peritoneal fluid>>  [name], PharmD, [place], AAHIVP, CPP Infectious Disease Pharmacist 06/09/2020 8:40 AM  Addendum:  4/4 enterococcus faecalis. Change to ampicillin 2g IV q12  [name], PharmD, [place], AAHIVP, CPP Infectious Disease Pharmacist 06/09/2020 3:00 PM

## 2020-06-09 NOTE — Progress Notes (Signed)
PHARMACY - PHYSICIAN COMMUNICATION CRITICAL VALUE ALERT - BLOOD CULTURE IDENTIFICATION (BCID)  [name] is an 74 y.o. male who presented to [facility] on 06/08/2020 with a chief complaint of sepsis.   Assessment:  Pt is a new ESRD pt who presented to [facility] with sepsis. He recently had his PD and AVF catheter placed. Blood cultures have grown out GPC. BCID>4/4 amp sensitive enterococcus faecalis. D/w case with Dr. Wyonia Hough, we will change to ampicillin.  Name of physician (or Provider) Contacted: [place]  Current antibiotics: Vanc/cefepime  Changes to prescribed antibiotics recommended:  Change vanc/cefepime to ampicilling 2g IV q12  Results for orders placed or performed during the hospital encounter of 06/08/20  Blood Culture ID Panel (Reflexed) (Collected: 06/08/2020  4:26 PM)  Result Value Ref Range   Enterococcus faecalis DETECTED (A) NOT DETECTED   Enterococcus Faecium NOT DETECTED NOT DETECTED   Listeria monocytogenes NOT DETECTED NOT DETECTED   Staphylococcus species NOT DETECTED NOT DETECTED   Staphylococcus aureus (BCID) NOT DETECTED NOT DETECTED   Staphylococcus epidermidis NOT DETECTED NOT DETECTED   Staphylococcus lugdunensis NOT DETECTED NOT DETECTED   Streptococcus species NOT DETECTED NOT DETECTED   Streptococcus agalactiae NOT DETECTED NOT DETECTED   Streptococcus pneumoniae NOT DETECTED NOT DETECTED   Streptococcus pyogenes NOT DETECTED NOT DETECTED   A.calcoaceticus-baumannii NOT DETECTED NOT DETECTED   Bacteroides fragilis NOT DETECTED NOT DETECTED   Enterobacterales NOT DETECTED NOT DETECTED   Enterobacter cloacae complex NOT DETECTED NOT DETECTED   Escherichia coli NOT DETECTED NOT DETECTED   Klebsiella aerogenes NOT DETECTED NOT DETECTED   Klebsiella oxytoca NOT DETECTED NOT DETECTED   Klebsiella pneumoniae NOT DETECTED NOT DETECTED   Proteus species NOT DETECTED NOT DETECTED   Salmonella species NOT DETECTED NOT DETECTED   Serratia marcescens NOT  DETECTED NOT DETECTED   Haemophilus influenzae NOT DETECTED NOT DETECTED   Neisseria meningitidis NOT DETECTED NOT DETECTED   Pseudomonas aeruginosa NOT DETECTED NOT DETECTED   Stenotrophomonas maltophilia NOT DETECTED NOT DETECTED   Candida albicans NOT DETECTED NOT DETECTED   Candida auris NOT DETECTED NOT DETECTED   Candida glabrata NOT DETECTED NOT DETECTED   Candida krusei NOT DETECTED NOT DETECTED   Candida parapsilosis NOT DETECTED NOT DETECTED   Candida tropicalis NOT DETECTED NOT DETECTED   Cryptococcus neoformans/gattii NOT DETECTED NOT DETECTED   Vancomycin resistance NOT DETECTED NOT DETECTED    [name], PharmD, BCIDP, AAHIVP, CPP Infectious Disease Pharmacist 06/09/2020 2:59 PM

## 2020-06-09 NOTE — Progress Notes (Signed)
New Admission Note:  Arrival Method: via [facility] Mental Orientation: Alert and oriented x 4 Telemetry: Box 06 NSL Assessment: Completed Skin: Warm and dry. Abdomen with surgical incisions with steris strips.  IV: NSL x 3 Rt Arm Pain: Denies Tubes: PD CATH Safety Measures: Safety Fall Prevention Plan initiated.  Admission: Completed 5 M  Orientation: Patient has been orientated to the room, unit and the staff. Welcome booklet given.  Family: None.   Orders have been reviewed and implemented. Will continue to monitor the patient. Call light has been placed within reach and bed alarm has been activated.   Sima Matas BSN, RN  Phone Number: [phone]

## 2020-06-09 NOTE — Plan of Care (Signed)
Problem: Education: °Goal: Knowledge of General Education information will improve °Description: Including pain rating scale, medication(s)/side effects and non-pharmacologic comfort measures °Outcome: Completed/Met °  °

## 2020-06-09 NOTE — Progress Notes (Signed)
PROGRESS NOTE    [name]  DXI:[id number] DOB: 04/14/1946 DOA: 06/08/2020 PCP: Celene Squibb, MD     Brief Narrative:   Per HP:  [name]  is a 74 y.o. male, with past medical history of COPD, CAD, hypertension, depression, hypothyroidism, hyperlipidemia, obesity, MGUS, patient with progressive renal disease, is being planned to initiate dialysis, he is is been followed by [facility] nephrology, patient had peritoneal dialysis catheter inserted on 05/14/2020, and left aVF inserted by Dr. Dixie Dials 76/12/3417, patient had 5 session of training peritoneal dialysis last week, per wife report he did receive IV iron for anemia, she reports he did have 1600 cc negative balance during session yesterday, patient presented to ED secondary to complaints of fever and chills, he is vaccinated against Covid, reports progressive dyspnea over the last couple days, worsening lower extremity edema over last 2 weeks, as well he does report some wheezing, dry heaving, he denies any chest pain. -In ED he was saturating in the mid 80s on room air, tolerating 2 L nasal cannula, chest x-ray significant for diffuse multifocal opacities related to volume overload versus infectious process, he was febrile of 101, with leukocytosis of 22K, procalcitonin at 1.26, CRP at 16.7, D-dimers at 2.67, worsening anemia with hemoglobin of 8.8, he was started on broad-spectrum antibiotics and [facility] hospitalist consulted to admit   Assessment & Plan:   Active Problems:   CAD (coronary artery disease)   Depression   Essential (primary) hypertension   Chronic obstructive pulmonary disease, unspecified (HCC)   Hyperlipidemia, unspecified   Hypothyroidism, unspecified   Atherosclerotic heart disease of native coronary artery without angina pectoris   Morbid (severe) obesity due to excess calories (HCC)   Fever   Acute hypoxic respiratory failure -He was saturating 86% on room air, currently 97% on 3 L nasal  cannula -Multifactorial, in the setting of volume overload due to renal disease, COPD exacerbation, and possible pneumonia. -Patient with elevated D-dimers, pending VQ scan (will avoid CTA chest given he still making urine) and venous Dopplers to rule out DVT. -Significant evidence of volume overload, - reports good UOP with 80mg PO lASIX, WILL RESTART WITH 40 IV BID -He will be on broad-spectrum antibiotic coverage to cover for pneumonia as well.  GPC Bacteremia; -Blood cultures collected antipain reported to be 2/2+ for gram-positive cocci -Added vancomycin pharmacy to dose -We will follow up cultures and sensitivities from outside facility  COPD exacerbation -He is with significant wheezing, he will be continued on IV steroids, scheduled duo nebs.  Possible multifocal pneumonia -Patient presents with fever, chest x-ray significant for multifocal opacity (volume overload versus infectious process), procalcitonin is elevated (unreliable in the setting of renal disease). -For now we will continue with broad-spectrum antibiotic coverage.  CKD stage V, approaching ESRD evidence of significant volume overload -Patient has started on peritoneal dialysis for last 5 days as training, report he had 1600 cc of volume removed with peritoneal dialysis on Friday -Report he completed peritoneal dialysis with partial amounts on Monday Tuesday Wednesday, none on Thursday, enlarger removal on Friday -Pending neurology consultation for further evaluation peritoneal versus edema dialysis. -Left AV fistula inserted by Dr. Dixie Dials 37/02/239 -Peritoneal hemodialysis inserted by general surgery at [facility] in [place] on 7/16.  Fever/leukocytosis likely secondary to bacteremia -Questionable pneumonia on x-ray, negative urine analysis, abdominal exam remains nontender, no clinical evidence of peritonitis. 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 -Procrit per renal  Hypertension -Resume metoprolol in the setting of known history of CAD, I will hold Norvasc and hydralazine allow room for IV diuresis and dialysis if needed.  CAD -Continue with aspirin, statin and beta-blockers  Hypothyroidism -Continue with Synthroid  BPH -Continue with Proscar  Depression -Continue with home medications  Hyperlipidemia -Continue with home medications  DVT prophylaxis: Heparin SQ  Code Status: full    Code Status Orders  (From admission, onward)         Start     Ordered   06/08/20 2326  Full code  Continuous        06/08/20 2325        Code Status History    Date Active Date Inactive Code Status Order ID Comments User Context   07/13/2012 1148 07/15/2012 1723 Full Code [id number]  Payton Emerald, RN Inpatient   07/11/2012 1343 07/13/2012 1148 Full Code [id number]  Lunette Stands, RN Inpatient   Advance Care Planning Activity     Family Communication: With wife at bedside Disposition Plan: Patient remained inpatient for continued management of end-stage renal disease likely requiring hemodialysis versus peritoneal dialysis, IV antibiotics for management of bacteremia.  Patient is not medically stable for discharge   Consults called: None Admission status: Inpatient   Consultants:   Nephrology  Procedures:  DG Chest Portable 1 View  Result Date: 06/08/2020 CLINICAL DATA:  74 year old male with shortness of breath and chest pain EXAM: PORTABLE CHEST 1 VIEW COMPARISON:  08/05/2012 FINDINGS: Mild cardiomegaly, unchanged. Atherosclerotic calcification of the aortic arch. 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 Scattered diffuse patchy pulmonary opacities with a basal predominance. No significant pleural effusion. No pneumothorax. Median sternotomy wires remain in place. No  acute osseous abnormality. IMPRESSION: Diffuse bilateral mid lung and lower lobe predominant patchy pulmonary opacities, most compatible with multifocal pneumonia in the setting of fever. Pulmonary edema could appear similarly. Electronically Signed   By: Ruthann Cancer MD   On: 06/08/2020 14:10     Antimicrobials:   See MAR   Subjective: Patient resting in bed comfortably, wife at bedside Reports feeling thirsty  Objective: Vitals:   06/09/20 0444 06/09/20 0811 06/09/20 1059 06/09/20 1130  BP: (!) 107/57  (!) 112/48   Pulse: 86  94   Resp: 18  19   Temp: 98 F (36.7 C)  99.3 F (37.4 C)   TempSrc: Oral  Oral   SpO2: 98% 97% 95% 97%  Weight:      Height:        Intake/Output Summary (Last 24 hours) at 06/09/2020 1152 Last data filed at 06/09/2020 0446 Gross per 24 hour  Intake 500 ml  Output 1275 ml  Net -775 ml   Filed Weights   06/08/20 1327 06/09/20 0147  Weight: 117.9 kg 120.1 kg    Examination:  General exam: Appears calm and comfortable  Respiratory system: Clear to auscultation. Respiratory effort normal. Cardiovascular system: S1 & S2 heard, RRR. No JVD, murmurs, rubs, gallops or clicks. No pedal edema. Gastrointestinal system: Abdomen is nondistended, soft and nontender.  PD catheter in place no organomegaly or masses felt. Normal bowel sounds heard. Central nervous system: Alert and oriented. No focal neurological deficits. Extremities: Warm well perfused, 1+ pitting edema bilaterally Skin: No rashes, lesions or ulcers Psychiatry: Judgement and insight appear normal. Mood & affect appropriate.     Data Reviewed: I have personally  reviewed following labs and imaging studies  CBC: Recent Labs  Lab 06/08/20 1332  WBC 22.3*  NEUTROABS 20.2*  HGB 8.8*  HCT 27.1*  MCV 98.2  PLT 654   Basic Metabolic Panel: Recent Labs  Lab 06/08/20 1621 06/09/20 1012  NA 135 138  K 3.9 3.5  CL 101 104  CO2 21* 17*  GLUCOSE 146* 190*  BUN 71* 79*   CREATININE 5.53* 6.06*  CALCIUM 8.6* 8.6*   GFR: Estimated Creatinine Clearance: 13.5 mL/min (A) (by C-G formula based on SCr of 6.06 mg/dL (H)). Liver Function Tests: Recent Labs  Lab 06/08/20 1621 06/09/20 1012  AST 24 27  ALT 14 13  ALKPHOS 56 49  BILITOT 0.6 0.6  PROT 6.9 6.1*  ALBUMIN 3.1* 2.4*   No results for input(s): LIPASE, AMYLASE in the last 168 hours. No results for input(s): AMMONIA in the last 168 hours. Coagulation Profile: No results for input(s): INR, PROTIME in the last 168 hours. Cardiac Enzymes: No results for input(s): CKTOTAL, CKMB, CKMBINDEX, TROPONINI in the last 168 hours. BNP (last 3 results) No results for input(s): PROBNP in the last 8760 hours. HbA1C: No results for input(s): HGBA1C in the last 72 hours. CBG: No results for input(s): GLUCAP in the last 168 hours. Lipid Profile: Recent Labs    06/08/20 1621  TRIG 80   Thyroid Function Tests: No results for input(s): TSH, T4TOTAL, FREET4, T3FREE, THYROIDAB in the last 72 hours. Anemia Panel: Recent Labs    06/08/20 1621  FERRITIN 386*   Sepsis Labs: Recent Labs  Lab 06/08/20 1621 06/08/20 1942  PROCALCITON 1.26  --   LATICACIDVEN 1.1 0.8    Recent Results (from the past 240 hour(s))  Blood Culture (routine x 2)     Status: None (Preliminary result)   Collection Time: 06/08/20  4:21 PM   Specimen: BLOOD  Result Value Ref Range Status   Specimen Description   Final    BLOOD Performed at [facility], [address], [place], [place] [address]    Special Requests   Final    NONE Performed at [facility], [address], [place], [place] [address]    Culture  Setup Time   Final    GRAM POSITIVE COCCI ANAEROBIC BOTTLE ONLY Gram Stain Report Called to,Read Back By and Verified With: HEATHER CRAWFORD,RN @0741  06/09/2020 Lorrin Jackson POSITIVE COCCI AEROBIC BOTTLE ONLY Performed at [facility], [address] [place], [place] [address]    Culture [facility] POSITIVE COCCI  Final    Report Status PENDING  Incomplete  Blood Culture (routine x 2)     Status: None (Preliminary result)   Collection Time: 06/08/20  4:26 PM   Specimen: BLOOD  Result Value Ref Range Status   Specimen Description   Final    BLOOD Performed at [facility], [address] [place], [place] [address]    Special Requests   Final    NONE Performed at [facility], [address], [place], [place] [address]    Culture  Setup Time   Final    GRAM POSITIVE COCCI ANAEROBIC BOTTLE ONLY Gram Stain Report Called to,Read Back By and Verified With: HEATHER CRAWFORD,RN @0741  06/09/2020 KAY GRAM POSITIVE COCCI AEROBIC BOTTLE ONLY Organism ID to follow Performed at [facility], [place] [address], [place], [place] [address]    Culture GRAM POSITIVE COCCI  Final   Report Status PENDING  Incomplete  Resp Panel by RT-PCR (Flu A&B, Covid) Nasopharyngeal Swab  Status: None   Collection Time: 06/08/20  5:05 PM   Specimen: Nasopharyngeal Swab; Nasopharyngeal(NP) swabs in vial transport medium  Result Value Ref Range Status   SARS Coronavirus 2 by RT PCR NEGATIVE NEGATIVE Final    Comment: (NOTE) SARS-CoV-2 target nucleic acids are NOT DETECTED.  The SARS-CoV-2 RNA is generally detectable in upper respiratory specimens during the acute phase of infection. The lowest concentration of SARS-CoV-2 viral copies this assay can detect is 138 copies/mL. A negative result does not preclude SARS-Cov-2 infection and should not be used as the sole basis for treatment or other patient management decisions. A negative result may occur with  improper specimen collection/handling, submission of specimen other than nasopharyngeal swab, presence of viral mutation(s) within the areas targeted by this assay, and inadequate number of viral copies(<138 copies/mL). A negative result must be combined with clinical observations, patient history, and epidemiological information. The expected result is Negative.  Fact  Sheet for Patients:  EntrepreneurPulse.com.au  Fact Sheet for Healthcare Providers:  IncredibleEmployment.be  This test is no t yet approved or cleared by the [place] FDA and  has been authorized for detection and/or diagnosis of SARS-CoV-2 by FDA under an Emergency Use Authorization (EUA). This EUA will remain  in effect (meaning this test can be used) for the duration of the COVID-19 declaration under Section 564(b)(1) of the Act, 21 U.S.C.section 360bbb-3(b)(1), unless the authorization is terminated  or revoked sooner.       Influenza A by PCR NEGATIVE NEGATIVE Final   Influenza B by PCR NEGATIVE NEGATIVE Final    Comment: (NOTE) The Xpert Xpress SARS-CoV-2/FLU/RSV plus assay is intended as an aid in the diagnosis of influenza from Nasopharyngeal swab specimens and should not be used as a sole basis for treatment. Nasal washings and aspirates are unacceptable for Xpert Xpress SARS-CoV-2/FLU/RSV testing.  Fact Sheet for Patients: EntrepreneurPulse.com.au  Fact Sheet for Healthcare Providers: IncredibleEmployment.be  This test is not yet approved or cleared by the [place] FDA and has been authorized for detection and/or diagnosis of SARS-CoV-2 by FDA under an Emergency Use Authorization (EUA). This EUA will remain in effect (meaning this test can be used) for the duration of the COVID-19 declaration under Section 564(b)(1) of the Act, 21 U.S.C. section 360bbb-3(b)(1), unless the authorization is terminated or revoked.  Performed at [facility], [address], [place], [place] [address]          Radiology Studies: DG Chest Portable 1 View  Result Date: 06/08/2020 CLINICAL DATA:  74 year old male with shortness of breath and chest pain EXAM: PORTABLE CHEST 1 VIEW COMPARISON:  08/05/2012 FINDINGS: Mild cardiomegaly, unchanged. Atherosclerotic calcification of the aortic arch. Aortic  valve prosthesis in place. Scattered diffuse patchy pulmonary opacities with a basal predominance. No significant pleural effusion. No pneumothorax. Median sternotomy wires remain in place. No acute osseous abnormality. IMPRESSION: Diffuse bilateral mid lung and lower lobe predominant patchy pulmonary opacities, most compatible with multifocal pneumonia in the setting of fever. Pulmonary edema could appear similarly. Electronically Signed   By: Ruthann Cancer MD   On: 06/08/2020 14:10        Scheduled Meds:  aspirin  325 mg Oral Daily   calcitRIOL  0.25 mcg Oral QODAY   cholecalciferol  2,000 Units Oral QHS   cholecalciferol  4,000 Units Oral Daily   finasteride  5 mg Oral Daily   furosemide  40 mg Intravenous BID   heparin  5,000 Units Subcutaneous Q8H   ipratropium-albuterol  3  mL Nebulization Q4H   latanoprost  1 drop Both Eyes QHS   levothyroxine  175 mcg Oral Q0600   mouth rinse  15 mL Mouth Rinse BID   methylPREDNISolone (SOLU-MEDROL) injection  80 mg Intravenous Q8H   metoprolol tartrate  25 mg Oral BID   multivitamin  1 tablet Oral Daily   omega-3 acid ethyl esters  1 g Oral BID   pantoprazole  40 mg Oral Daily   senna-docusate  1 tablet Oral BID   sertraline  100 mg Oral Daily   simvastatin  40 mg Oral QPM   sodium bicarbonate  650 mg Oral TID   vancomycin variable dose per unstable renal function (pharmacist dosing)   Does not apply See admin instructions   Continuous Infusions:  sodium chloride 1,000 mL (06/09/20 0546)   ceFEPime (MAXIPIME) IV       LOS: 1 day    Time spent: 82 min    Nicolette Bang, MD [facility]  If 7PM-7AM, please contact night-coverage  06/09/2020, 11:52 AM

## 2020-06-09 NOTE — Evaluation (Signed)
Physical Therapy Evaluation Patient Details Name: [name] MRN: [id number] DOB: 1946/01/24 Today's Date: 06/09/2020   History of Present Illness  74 y.o. male, with past medical history of COPD, CAD, hypertension, depression, hypothyroidism, hyperlipidemia, obesity, MGUS, patient with progressive renal disease, is being planned to initiate dialysis, he is is been followed by [facility] nephrology, patient had peritoneal dialysis catheter inserted on 05/14/2020, and left aVF inserted by Dr. Dixie Dials 50/02/3266, patient had 5 session of training peritoneal dialysis last week, per wife report he did receive IV iron for anemia, she reports he did have 1600 cc negative balance during session yesterday, patient presented to ED secondary to complaints of fever and chills, he is vaccinated against Covid, reports progressive dyspnea over the last couple days, worsening lower extremity edema over last 2 weeks, as well he does report some wheezing, dry heaving, he denies any chest pain.    Clinical Impression  Pt admitted with above diagnosis. PTA pt lived at home with his wife, independent mobility/ADLs. On eval, he required min guard assist bed mobility, min guard assist transfers, and min/HHA ambulation 60'. Mobilized on 4L O2 with SpO2 remaining in the 90s. 3/4 DOE noted requiring one standing rest break. Pt did not need home O2 prior to hospitalization. As pt improves medically will further assess O2 needs to determine if home O2 is required. Pt currently with functional limitations due to the deficits listed below. Pt will benefit from skilled PT to increase their independence and safety with mobility to allow discharge to the venue listed below.       Follow Up Recommendations Home health PT;Supervision for mobility/OOB    Equipment Recommendations  None recommended by PT    Recommendations for Other Services       Precautions / Restrictions Precautions Precautions: Fall Precaution  Comments: Pt reports h/o falls.      Mobility  Bed Mobility Overal bed mobility: Needs Assistance Bed Mobility: Supine to Sit     Supine to sit: Min guard;HOB elevated     General bed mobility comments: +rail, increased time, min guard for safety    Transfers Overall transfer level: Needs assistance Equipment used: None Transfers: Sit to/from [facility] Sit to Stand: Min guard Stand pivot transfers: Min guard       General transfer comment: min guard for safety  Ambulation/Gait Ambulation/Gait assistance: Min assist Gait Distance (Feet): 60 Feet Assistive device: 1 person hand held assist Gait Pattern/deviations: Step-through pattern;Decreased stride length Gait velocity: decreased Gait velocity interpretation: <1.31 ft/sec, indicative of household ambulator General Gait Details: unsteady gait. Min/HHA to stabilize balance. Wife assisted by pushing IV pole/port O2. Mobilized on 4L O2. Bried desat to 90% but SpO2 primarily 95-99%. 3/4 DOE. Max HR 112  Stairs            Wheelchair Mobility    Modified Rankin (Stroke Patients Only)       Balance Overall balance assessment: Needs assistance Sitting-balance support: No upper extremity supported;Feet supported Sitting balance-Leahy Scale: Good     Standing balance support: Single extremity supported;No upper extremity supported;During functional activity Standing balance-Leahy Scale: Fair Standing balance comment: static stand without assist, min/HHA ambulation                             Pertinent Vitals/Pain Pain Assessment: No/denies pain    Home Living Family/patient expects to be discharged to:: Private residence Living Arrangements: Spouse/significant other Available Help at Discharge:  Family;Available 24 hours/day Type of Home: House Home Access: Stairs to enter Entrance Stairs-Rails: Psychiatric nurse of Steps: 2 Home Layout: One level Home  Equipment: Cane - single point      Prior Function Level of Independence: Independent               Hand Dominance        Extremity/Trunk Assessment   Upper Extremity Assessment Upper Extremity Assessment: Defer to OT evaluation    Lower Extremity Assessment Lower Extremity Assessment: Generalized weakness    Cervical / Trunk Assessment Cervical / Trunk Assessment: Normal  Communication   Communication: No difficulties  Cognition Arousal/Alertness: Awake/alert Behavior During Therapy: Anxious Overall Cognitive Status: Within Functional Limits for tasks assessed                                 General Comments: intention tremor noted. Pt reports its worse now due to anxiety.      General Comments General comments (skin integrity, edema, etc.): Vitals at rest: HR in 90s, SpO2 96%, BP 112/48. After amb BP 118/56.    Exercises General Exercises - Lower Extremity Hip Flexion/Marching: AAROM;5 reps;Standing;Left;Right Heel Raises: AAROM;5 reps;Standing;Both Mini-Sqauts: 5 reps;Standing;AAROM;Both   Assessment/Plan    PT Assessment Patient needs continued PT services  PT Problem List Decreased strength;Decreased mobility;Decreased activity tolerance;Cardiopulmonary status limiting activity;Decreased balance       PT Treatment Interventions Therapeutic activities;DME instruction;Gait training;Therapeutic exercise;Patient/family education;Stair training;Balance training;Functional mobility training    PT Goals (Current goals can be found in the Care Plan section)  Acute Rehab PT Goals Patient Stated Goal: home PT Goal Formulation: With patient/family Time For Goal Achievement: 06/23/20 Potential to Achieve Goals: Good    Frequency Min 3X/week   Barriers to discharge        Co-evaluation               AM-PAC PT "6 Clicks" Mobility  Outcome Measure Help needed turning from your back to your side while in a flat bed without using  bedrails?: None Help needed moving from lying on your back to sitting on the side of a flat bed without using bedrails?: A Little Help needed moving to and from a bed to a chair (including a wheelchair)?: A Little Help needed standing up from a chair using your arms (e.g., wheelchair or bedside chair)?: A Little Help needed to walk in hospital room?: A Little Help needed climbing 3-5 steps with a railing? : A Lot 6 Click Score: 18    End of Session Equipment Utilized During Treatment: Gait belt;Oxygen Activity Tolerance: Patient tolerated treatment well Patient left: in chair;with call bell/phone within reach;with family/visitor present Nurse Communication: Mobility status PT Visit Diagnosis: Unsteadiness on feet (R26.81);Muscle weakness (generalized) (M62.81);History of falling (Z91.81)    Time: 9702-6378 PT Time Calculation (min) (ACUTE ONLY): 41 min   Charges:   PT Evaluation $PT Eval Moderate Complexity: 1 Mod PT Treatments $Gait Training: 23-37 mins        Lorrin Goodell, PT  Office # [phone] Pager [phone]   Lorriane Shire 06/09/2020, 12:25 PM

## 2020-06-09 NOTE — Consult Note (Signed)
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 Wouldn't call e faecalis a normal skin flora, but supposed it would be possible to have it translocated via recent left UE avf creation. Regardless of source, given duration of sepsis, and presence of prosthetic valve, very concerning for endocarditis. Will need tee  At this time no other obvious metastatic foci of infection. His bilateral tka are without swelling/tenderness/warmth  Plan: 1. Add ceftriaxone 2 gram q12 to ampicillin for empiric e faecalis endocarditis 2. Repeat bcx tomorrow x2 3. Tte; if negative will need tee    Active Problems:   CAD (coronary artery disease)   Depression   Essential (primary) hypertension   Chronic obstructive pulmonary disease, unspecified (HCC)   Hyperlipidemia, unspecified   Hypothyroidism, unspecified   Atherosclerotic heart disease of native coronary artery without angina pectoris   Morbid (severe) obesity due to excess calories (HCC)    Fever   Scheduled Meds: . aspirin  325 mg Oral Daily  . calcitRIOL  0.25 mcg Oral QODAY  . cholecalciferol  2,000 Units Oral QHS  . cholecalciferol  4,000 Units Oral Daily  . finasteride  5 mg Oral Daily  . furosemide  40 mg Intravenous BID  . heparin  5,000 Units Subcutaneous Q8H  . ipratropium-albuterol  3 mL Nebulization Q4H  . latanoprost  1 drop Both Eyes QHS  . levothyroxine  175 mcg Oral Q0600  . mouth rinse  15 mL Mouth Rinse BID  . methylPREDNISolone (SOLU-MEDROL) injection  80 mg Intravenous Q8H  . metoprolol tartrate  25 mg Oral BID  . multivitamin  1 tablet Oral Daily  . omega-3 acid ethyl esters  1 g Oral BID  . pantoprazole  40 mg Oral Daily  . senna-docusate  1 tablet Oral BID  . sertraline  100 mg Oral Daily  . simvastatin  40 mg Oral QPM  . sodium bicarbonate  1,300 mg Oral TID WC   Continuous Infusions: . sodium chloride 1,000 mL (06/09/20 0546)  . ampicillin (OMNIPEN) IV     PRN Meds:.acetaminophen **OR** acetaminophen, ondansetron **OR** ondansetron (ZOFRAN) IV  HPI: [name] is a 74 y.o. 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 No issue with it. No abd pain.  However he started to have above sx acutely and they have been progressive. He endorsed subjective f/c, asthenia, unwell being, sob, dry heaves, nausea, fatigue of several days  Hospital course: 12/11 ED hypoxic mid 80s on room air; febrile 101 cxr diffuse  multifocal opacities wbc 22K, lft wnl procalcitonin at 1.26, CRP at 16.7 Urine >300 protein, 0-5 rbc, 0-5 wbc, no bacteria Flu/rsv/covid negative Started on -dimers at 2.67, worsening anemia with hemoglobin of 8.8, he was started on broad-spectrum antibiotics  12/12 bcx returned with e faecalis; abx changed to ampicillin  Patient vaccinated to covid previously  Over the last 24 hours had felt much better   Review of Systems: ROS Negative 11 point ros unless mentioned above  Past Medical History:  Diagnosis Date  . Aortic stenosis   . Arthritis   . Atherosclerotic heart disease of native coronary artery without angina pectoris   . CAD (coronary artery disease)   . Chronic obstructive pulmonary disease, unspecified ([place])   . COPD (chronic obstructive pulmonary disease) ([place])   . Depression   . Essential (primary) hypertension   . Gout, unspecified   . Heart murmur   . Hemorrhoid   . Hyperlipidemia, unspecified   . Hypertension   . Hypothyroidism   . Hypothyroidism, unspecified   . Kidney stones   . Major depressive disorder, single episode, unspecified   . Morbid (severe) obesity due to excess calories ([place])   . Nonrheumatic aortic (valve) stenosis   . Orthostatic hypotension   . Syncope   . Thyroid disease     Social History   Tobacco Use  . Smoking status: Former Smoker    Packs/day: 3.00    Years: 50.00    Pack years: 150.00    Types: Cigarettes    Quit date: 12/13/2010    Years since quitting: 9.4  . Smokeless tobacco: Former Systems developer  . Tobacco comment: Electronic cigarette...USES INFREQUENTLY NOW  Vaping Use  . Vaping Use: Every day  . Start date: 02/01/2013  Substance Use Topics  . Alcohol use: Yes    Comment: Rare  . Drug use: No    Family History  Problem Relation Age of Onset  . Heart disease Mother   . Heart disease Father   . Hypertension Father    No Known Allergies  OBJECTIVE: Blood pressure (!) 112/48, pulse 94, temperature 99.3 F (37.4  C), temperature source Oral, resp. rate 19, height 5\' 9"  (1.753 m), weight 120.1 kg, SpO2 97 %.  Physical Exam Obese, no distress, conversant Heent: shaved/alopecia, atraumatic, per, conj clear, eomi Neck supple cv rrr; positive systolic murmur Lungs clear; scattered upper airway sound abd soft, nt; right sided pd catheter insertion site no erythema/fluctuance/tenderness msk no peripheral joint tenderness; s/p bilateral tka; no back tenderness Skin no rash Ext trace le edema Neuro cn2-12 intact, strength 5/5 symmetric Psych alert/oriented  Lab Results Lab Results  Component Value Date   WBC 22.3 (H) 06/08/2020   HGB 8.8 (L) 06/08/2020   HCT 27.1 (L) 06/08/2020   MCV 98.2 06/08/2020   PLT 224 06/08/2020    Lab Results  Component Value Date   CREATININE 6.06 (H) 06/09/2020   BUN 79 (H) 06/09/2020   NA 138 06/09/2020   K 3.5 06/09/2020   CL 104 06/09/2020   CO2 17 (L) 06/09/2020  Lab Results  Component Value Date   ALT 13 06/09/2020   AST 27 06/09/2020   ALKPHOS 49 06/09/2020   BILITOT 0.6 06/09/2020     Microbiology: Recent Results (from the past 240 hour(s))  Blood Culture (routine x 2)     Status: None (Preliminary result)   Collection Time: 06/08/20  4:21 PM   Specimen: BLOOD  Result Value Ref Range Status   Specimen Description   Final    BLOOD Performed at [facility], [address], [place], [place] [address]    Special Requests   Final    NONE Performed at [facility], [address], [place], [place] [address]    Culture  Setup Time   Final    GRAM POSITIVE COCCI ANAEROBIC BOTTLE ONLY Gram Stain Report Called to,Read Back By and Verified With: HEATHER CRAWFORD,RN @0741  06/09/2020 KAY GRAM POSITIVE COCCI AEROBIC BOTTLE ONLY Performed at [facility], [address], [place], [place] [address]    Culture [facility] POSITIVE COCCI  Final   Report Status PENDING  Incomplete  Blood Culture (routine x 2)     Status: None (Preliminary result)   Collection  Time: 06/08/20  4:26 PM   Specimen: BLOOD  Result Value Ref Range Status   Specimen Description   Final    BLOOD Performed at [facility], [address], [place], [place] [address]    Special Requests   Final    NONE Performed at [facility], [address], [place], [place] [address]    Culture  Setup Time   Final    GRAM POSITIVE COCCI ANAEROBIC BOTTLE ONLY Gram Stain Report Called to,Read Back By and Verified With: HEATHER CRAWFORD,RN @0741  06/09/2020 KAY GRAM POSITIVE COCCI AEROBIC BOTTLE ONLY Organism ID to follow CRITICAL RESULT CALLED TO, READ BACK BY AND VERIFIED WITH: Alycia Patten [id number] MLM Performed at [facility], [place] [address], [place], [place] [address]    Culture GRAM POSITIVE COCCI  Final   Report Status PENDING  Incomplete  Blood Culture ID Panel (Reflexed)     Status: Abnormal   Collection Time: 06/08/20  4:26 PM  Result Value Ref Range Status   Enterococcus faecalis DETECTED (A) NOT DETECTED Final    Comment: CRITICAL RESULT CALLED TO, READ BACK BY AND VERIFIED WITH: PHARMD K PIERCE [phone] MLM    Enterococcus Faecium NOT DETECTED NOT DETECTED Final   Listeria monocytogenes NOT DETECTED NOT DETECTED Final   Staphylococcus species NOT DETECTED NOT DETECTED Final   Staphylococcus aureus (BCID) NOT DETECTED NOT DETECTED Final   Staphylococcus epidermidis NOT DETECTED NOT DETECTED Final   Staphylococcus lugdunensis NOT DETECTED NOT DETECTED Final   Streptococcus species NOT DETECTED NOT DETECTED Final   Streptococcus agalactiae NOT DETECTED NOT DETECTED Final   Streptococcus pneumoniae NOT DETECTED NOT DETECTED Final   Streptococcus pyogenes NOT DETECTED NOT DETECTED Final   A.calcoaceticus-baumannii NOT DETECTED NOT DETECTED Final   Bacteroides fragilis NOT DETECTED NOT DETECTED Final   Enterobacterales NOT DETECTED NOT DETECTED Final   Enterobacter cloacae complex NOT DETECTED NOT DETECTED Final   Escherichia coli NOT DETECTED NOT  DETECTED Final   Klebsiella aerogenes NOT DETECTED NOT DETECTED Final   Klebsiella oxytoca NOT DETECTED NOT DETECTED Final   Klebsiella pneumoniae NOT DETECTED NOT DETECTED Final   Proteus species NOT DETECTED NOT DETECTED Final   Salmonella species NOT DETECTED NOT DETECTED Final   Serratia marcescens NOT DETECTED NOT DETECTED Final   Haemophilus influenzae NOT  DETECTED NOT DETECTED Final   Neisseria meningitidis NOT DETECTED NOT DETECTED Final   Pseudomonas aeruginosa NOT DETECTED NOT DETECTED Final   Stenotrophomonas maltophilia NOT DETECTED NOT DETECTED Final   Candida albicans NOT DETECTED NOT DETECTED Final   Candida auris NOT DETECTED NOT DETECTED Final   Candida glabrata NOT DETECTED NOT DETECTED Final   Candida krusei NOT DETECTED NOT DETECTED Final   Candida parapsilosis NOT DETECTED NOT DETECTED Final   Candida tropicalis NOT DETECTED NOT DETECTED Final   Cryptococcus neoformans/gattii NOT DETECTED NOT DETECTED Final   Vancomycin resistance NOT DETECTED NOT DETECTED Final    Comment: Performed at [facility], [place] [address] [place], [place] [address]  Resp Panel by RT-PCR (Flu A&B, Covid) Nasopharyngeal Swab     Status: None   Collection Time: 06/08/20  5:05 PM   Specimen: Nasopharyngeal Swab; Nasopharyngeal(NP) swabs in vial transport medium  Result Value Ref Range Status   SARS Coronavirus 2 by RT PCR NEGATIVE NEGATIVE Final    Comment: (NOTE) SARS-CoV-2 target nucleic acids are NOT DETECTED.  The SARS-CoV-2 RNA is generally detectable in upper respiratory specimens during the acute phase of infection. The lowest concentration of SARS-CoV-2 viral copies this assay can detect is 138 copies/mL. A negative result does not preclude SARS-Cov-2 infection and should not be used as the sole basis for treatment or other patient management decisions. A negative result may occur with  improper specimen collection/handling, submission of specimen other than nasopharyngeal  swab, presence of viral mutation(s) within the areas targeted by this assay, and inadequate number of viral copies(<138 copies/mL). A negative result must be combined with clinical observations, patient history, and epidemiological information. The expected result is Negative.  Fact Sheet for Patients:  EntrepreneurPulse.com.au  Fact Sheet for Healthcare Providers:  IncredibleEmployment.be  This test is no t yet approved or cleared by the [place] FDA and  has been authorized for detection and/or diagnosis of SARS-CoV-2 by FDA under an Emergency Use Authorization (EUA). This EUA will remain  in effect (meaning this test can be used) for the duration of the COVID-19 declaration under Section 564(b)(1) of the Act, 21 U.S.C.section 360bbb-3(b)(1), unless the authorization is terminated  or revoked sooner.       Influenza A by PCR NEGATIVE NEGATIVE Final   Influenza B by PCR NEGATIVE NEGATIVE Final    Comment: (NOTE) The Xpert Xpress SARS-CoV-2/FLU/RSV plus assay is intended as an aid in the diagnosis of influenza from Nasopharyngeal swab specimens and should not be used as a sole basis for treatment. Nasal washings and aspirates are unacceptable for Xpert Xpress SARS-CoV-2/FLU/RSV testing.  Fact Sheet for Patients: EntrepreneurPulse.com.au  Fact Sheet for Healthcare Providers: IncredibleEmployment.be  This test is not yet approved or cleared by the [place] FDA and has been authorized for detection and/or diagnosis of SARS-CoV-2 by FDA under an Emergency Use Authorization (EUA). This EUA will remain in effect (meaning this test can be used) for the duration of the COVID-19 declaration under Section 564(b)(1) of the Act, 21 U.S.C. section 360bbb-3(b)(1), unless the authorization is terminated or revoked.  Performed at [facility], [address], [place], [place] [address]     Jabier Mutton,  [place] for [place] [phone] pager    06/09/2020, 3:04 PM

## 2020-06-09 NOTE — Plan of Care (Signed)
  Problem: Health Behavior/Discharge Planning: Goal: Ability to manage health-related needs will improve Outcome: Progressing   Problem: Activity: Goal: Risk for activity intolerance will decrease Outcome: Progressing   Problem: Nutrition: Goal: Adequate nutrition will be maintained Outcome: Progressing   Problem: Safety: Goal: Ability to remain free from injury will improve Outcome: Progressing   

## 2020-06-09 NOTE — ED Notes (Signed)
Report given to [facility] via telephone.

## 2020-06-10 ENCOUNTER — Inpatient Hospital Stay (HOSPITAL_COMMUNITY): Payer: Managed Care, Other (non HMO)

## 2020-06-10 DIAGNOSIS — I10 Essential (primary) hypertension: Secondary | ICD-10-CM

## 2020-06-10 DIAGNOSIS — I248 Other forms of acute ischemic heart disease: Secondary | ICD-10-CM

## 2020-06-10 DIAGNOSIS — R6520 Severe sepsis without septic shock: Secondary | ICD-10-CM

## 2020-06-10 DIAGNOSIS — Z95818 Presence of other cardiac implants and grafts: Secondary | ICD-10-CM

## 2020-06-10 DIAGNOSIS — I361 Nonrheumatic tricuspid (valve) insufficiency: Secondary | ICD-10-CM

## 2020-06-10 DIAGNOSIS — I340 Nonrheumatic mitral (valve) insufficiency: Secondary | ICD-10-CM

## 2020-06-10 DIAGNOSIS — R7881 Bacteremia: Secondary | ICD-10-CM

## 2020-06-10 DIAGNOSIS — I509 Heart failure, unspecified: Secondary | ICD-10-CM

## 2020-06-10 LAB — BASIC METABOLIC PANEL
Anion gap: 18 — ABNORMAL HIGH (ref 5–15)
BUN: 90 mg/dL — ABNORMAL HIGH (ref 8–23)
CO2: 17 mmol/L — ABNORMAL LOW (ref 22–32)
Calcium: 8.7 mg/dL — ABNORMAL LOW (ref 8.9–10.3)
Chloride: 104 mmol/L (ref 98–111)
Creatinine, Ser: 6.52 mg/dL — ABNORMAL HIGH (ref 0.61–1.24)
GFR, Estimated: 8 mL/min — ABNORMAL LOW (ref >60)
Glucose, Bld: 189 mg/dL — ABNORMAL HIGH (ref 70–99)
Potassium: 3.4 mmol/L — ABNORMAL LOW (ref 3.5–5.1)
Sodium: 139 mmol/L (ref 135–145)

## 2020-06-10 LAB — CBC WITH DIFFERENTIAL/PLATELET
Abs Immature Granulocytes: 0.23 10*3/uL — ABNORMAL HIGH (ref 0.00–0.07)
Basophils Absolute: 0 10*3/uL (ref 0.0–0.1)
Basophils Relative: 0 %
Eosinophils Absolute: 0 10*3/uL (ref 0.0–0.5)
Eosinophils Relative: 0 %
HCT: 23.9 % — ABNORMAL LOW (ref 39.0–52.0)
Hemoglobin: 7.8 g/dL — ABNORMAL LOW (ref 13.0–17.0)
Immature Granulocytes: 1 %
Lymphocytes Relative: 3 %
Lymphs Abs: 0.5 10*3/uL — ABNORMAL LOW (ref 0.7–4.0)
MCH: 31.5 pg (ref 26.0–34.0)
MCHC: 32.6 g/dL (ref 30.0–36.0)
MCV: 96.4 fL (ref 80.0–100.0)
Monocytes Absolute: 0.4 10*3/uL (ref 0.1–1.0)
Monocytes Relative: 2 %
Neutro Abs: 17.4 10*3/uL — ABNORMAL HIGH (ref 1.7–7.7)
Neutrophils Relative %: 94 %
Platelets: 213 10*3/uL (ref 150–400)
RBC: 2.48 MIL/uL — ABNORMAL LOW (ref 4.22–5.81)
RDW: 14.3 % (ref 11.5–15.5)
WBC: 18.5 10*3/uL — ABNORMAL HIGH (ref 4.0–10.5)
nRBC: 0.1 % (ref 0.0–0.2)

## 2020-06-10 LAB — IRON AND TIBC
Iron: 123 ug/dL (ref 45–182)
Saturation Ratios: 54 % — ABNORMAL HIGH (ref 17.9–39.5)
TIBC: 227 ug/dL — ABNORMAL LOW (ref 250–450)
UIBC: 104 ug/dL

## 2020-06-10 LAB — GRAM STAIN

## 2020-06-10 LAB — ECHOCARDIOGRAM COMPLETE
AR max vel: 1.87 cm2
AV Area VTI: 2.02 cm2
AV Area mean vel: 1.87 cm2
AV Mean grad: 28 mmHg
AV Peak grad: 43.8 mmHg
Ao pk vel: 3.31 m/s
Area-P 1/2: 5.02 cm2
Height: 69 in
MV M vel: 4.91 m/s
MV Peak grad: 96.4 mmHg
S' Lateral: 3.1 cm
Weight: 4236.36 oz

## 2020-06-10 LAB — FERRITIN: Ferritin: 472 ng/mL — ABNORMAL HIGH (ref 24–336)

## 2020-06-10 MED ORDER — GENTAMICIN SULFATE 0.1 % EX CREA
TOPICAL_CREAM | Freq: Every day | CUTANEOUS | Status: DC
  Filled 2020-06-10: qty 15

## 2020-06-10 MED ORDER — POLYETHYLENE GLYCOL 3350 17 G PO PACK
17.0000 g | PACK | Freq: Every day | ORAL | Status: DC
  Administered 2020-06-10 – 2020-06-13 (×3): 17 g via ORAL
  Filled 2020-06-10 (×4): qty 1

## 2020-06-10 MED ORDER — METHYLPREDNISOLONE SODIUM SUCC 40 MG IJ SOLR
40.0000 mg | Freq: Two times a day (BID) | INTRAMUSCULAR | Status: DC
  Administered 2020-06-10 – 2020-06-11 (×2): 40 mg via INTRAVENOUS
  Filled 2020-06-10 (×2): qty 1

## 2020-06-10 MED ORDER — TECHNETIUM TO 99M ALBUMIN AGGREGATED
4.3000 | Freq: Once | INTRAVENOUS | Status: AC | PRN
  Administered 2020-06-10: 4.3 via INTRAVENOUS

## 2020-06-10 MED ORDER — ALBUTEROL SULFATE HFA 108 (90 BASE) MCG/ACT IN AERS
2.0000 | INHALATION_SPRAY | Freq: Four times a day (QID) | RESPIRATORY_TRACT | Status: DC | PRN
  Filled 2020-06-10: qty 6.7

## 2020-06-10 MED ORDER — DOCUSATE SODIUM 100 MG PO CAPS
100.0000 mg | ORAL_CAPSULE | Freq: Two times a day (BID) | ORAL | Status: DC
  Administered 2020-06-10 – 2020-06-13 (×7): 100 mg via ORAL
  Filled 2020-06-10 (×9): qty 1

## 2020-06-10 MED ORDER — POTASSIUM CHLORIDE CRYS ER 20 MEQ PO TBCR
40.0000 meq | EXTENDED_RELEASE_TABLET | Freq: Once | ORAL | Status: AC
  Administered 2020-06-10: 40 meq via ORAL
  Filled 2020-06-10 (×2): qty 2

## 2020-06-10 MED ORDER — FLUTICASONE PROPIONATE 50 MCG/ACT NA SUSP
1.0000 | Freq: Every day | NASAL | Status: DC
  Administered 2020-06-11 – 2020-06-14 (×4): 1 via NASAL
  Filled 2020-06-10: qty 16

## 2020-06-10 MED ORDER — OXYCODONE-ACETAMINOPHEN 5-325 MG PO TABS: 1.0000 | ORAL_TABLET | Freq: Four times a day (QID) | ORAL | Status: DC | PRN

## 2020-06-10 NOTE — Progress Notes (Signed)
PD fluid Sample collected and sent to the lab.

## 2020-06-10 NOTE — Progress Notes (Signed)
Patient ID: [name], male   DOB: [birth year], 74 y.o.   MRN: [id number] [facility] Progress Note   Assessment/ Plan:   1. 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  He has a recently placed left radiocephalic fistula.  Labs indicate worsening BUN/creatinine overnight with fair urine output in the setting of volume excess; elevated BUN likely also from Solu-Medrol use.  He does not have any florid uremic signs or symptoms at this time to prompt initiation of dialysis and we will continue to follow him closely as additional management is undertaken for his Enterococcus bacteremia. 2.  Acute exacerbation of congestive heart failure: Possibly with concomitant pneumonia versus COPD exacerbation.  Ongoing diuresis with furosemide 40 mg IV twice daily-we will increase this if respiratory symptoms unimproved overnight. 3.  Enterococcus bacteremia: Yesterday had collection of PD fluid for cell count and culture and is currently undergoing echocardiogram to evaluate for possible endocarditis given history of bioprosthetic AVR.  On antibiotic coverage with ceftriaxone and ampicillin. 4.  [place] metabolic acidosis: Secondary to progressive chronic kidney disease, on oral sodium bicarbonate. 5.  Anemia: Likely secondary to chronic kidney disease, check iron studies today.  Subjective:   Reports to be feeling fair and had some worsening of shortness of breath overnight with 1 L PD fluid instillation for cell count/culture.   Objective:   BP 113/61 (BP Location: Right Arm)   Pulse 88   Temp 98.3 F (36.8 C)   Resp 19   Ht 5\' 9"  (1.753 m)   Wt 120.1 kg   SpO2 96%   BMI 39.10 kg/m   Intake/Output Summary (Last 24 hours) at 06/10/2020 1013 Last data filed at 06/10/2020 1010 Gross per 24 hour  Intake 255.3 ml   Output 875 ml  Net -619.7 ml   Weight change: 2.165 kg  Physical Exam: Gen: Appears comfortable resting in bed, undergoing echocardiogram CVS: Pulse regular rhythm, normal rate, S1 and S2 normal Resp: Poor inspiratory effort with decreased breath sounds over bases Abd: Soft, obese, nontender.  PD catheter in situ Ext: 2+ pitting lower extremity edema.  Right RCF with palpable thrill.  Imaging: DG Chest 1 View  Result Date: 06/10/2020 CLINICAL DATA:  74 year old male with history of pneumonia, shortness of breath, fever. EXAM: CHEST  1 VIEW COMPARISON:  06/08/2020 FINDINGS: The radiograph is under exposed. Unchanged cardiomegaly. Atherosclerotic calcification of the aortic arch. Global decrease in previously visualized patchy diffuse pulmonary opacities. No significant pleural effusions. No pneumothorax. No new consolidative opacities. Median sternotomy wires remain intact. No acute osseous abnormality. IMPRESSION: Interval decrease in previously visualized diffuse patchy pulmonary opacities suggestive of improving pulmonary edema versus improving multifocal pneumonia. Unchanged cardiomegaly. Electronically Signed   By: Ruthann Cancer MD   On: 06/10/2020 08:16   DG Chest Portable 1 View  Result Date: 06/08/2020 CLINICAL DATA:  74 year old male with shortness of breath and chest pain EXAM: PORTABLE CHEST 1 VIEW COMPARISON:  08/05/2012 FINDINGS: Mild cardiomegaly, unchanged. Atherosclerotic calcification of the aortic arch. 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 Scattered diffuse patchy pulmonary opacities with a basal predominance. No significant pleural effusion. No pneumothorax. Median sternotomy wires remain in place. No acute osseous abnormality. IMPRESSION: Diffuse bilateral mid lung and lower lobe predominant patchy pulmonary opacities, most compatible with multifocal pneumonia in the setting of fever. Pulmonary edema could appear similarly. Electronically Signed  By: Ruthann Cancer MD   On:  06/08/2020 14:10    Labs: BMET Recent Labs  Lab 06/08/20 1621 06/09/20 0415 06/09/20 1012 06/10/20 0517  NA 135 139 138 139  K 3.9 3.7 3.5 3.4*  CL 101 104 104 104  CO2 21* 18* 17* 17*  GLUCOSE 146* 188* 190* 189*  BUN 71* 75* 79* 90*  CREATININE 5.53* 5.72* 6.06* 6.52*  CALCIUM 8.6* 8.8* 8.6* 8.7*   CBC Recent Labs  Lab 06/08/20 1332 06/09/20 0415 06/10/20 0517  WBC 22.3* 18.5* 18.5*  NEUTROABS 20.2*  --  17.4*  HGB 8.8* 8.0* 7.8*  HCT 27.1* 25.4* 23.9*  MCV 98.2 97.7 96.4  PLT 224 192 213    Medications:    . aspirin  325 mg Oral Daily  . calcitRIOL  0.25 mcg Oral QODAY  . cholecalciferol  2,000 Units Oral QHS  . cholecalciferol  4,000 Units Oral Daily  . finasteride  5 mg Oral Daily  . furosemide  40 mg Intravenous BID  . heparin  5,000 Units Subcutaneous Q8H  . ipratropium-albuterol  3 mL Nebulization Q6H  . latanoprost  1 drop Both Eyes QHS  . levothyroxine  175 mcg Oral Q0600  . mouth rinse  15 mL Mouth Rinse BID  . methylPREDNISolone (SOLU-MEDROL) injection  80 mg Intravenous Q8H  . metoprolol tartrate  25 mg Oral BID  . multivitamin  1 tablet Oral Daily  . omega-3 acid ethyl esters  1 g Oral BID  . pantoprazole  40 mg Oral Daily  . senna-docusate  1 tablet Oral BID  . sertraline  100 mg Oral Daily  . simvastatin  40 mg Oral QPM  . sodium bicarbonate  1,300 mg Oral TID WC   Elmarie Shiley, MD 06/10/2020, 10:13 AM

## 2020-06-10 NOTE — Plan of Care (Signed)
  Problem: Coping: Goal: Level of anxiety will decrease Outcome: Progressing   Problem: Skin Integrity: Goal: Risk for impaired skin integrity will decrease Outcome: Progressing   

## 2020-06-10 NOTE — TOC Progression Note (Signed)
Transition of Care [facility]) - Progression Note    Patient Details  Name: PADRAIG NHAN MRN: [id number] Date of Birth: 02/14/1946  Transition of [facility]) CM/SW Contact  Bartholomew Crews, RN Phone Number: [phone] 06/10/2020, 3:25 PM  Clinical Narrative:     Notified by Ramond Marrow at [facility] that patient referral for [facility] PT accepted. Patient will need HH PT with Face to Face order. TOC following for transition needs.   Expected Discharge Plan: [place] Barriers to Discharge: Continued Medical Work up  Expected Discharge Plan and Services Expected Discharge Plan: [place] In-house Referral: NA Discharge Planning Services: CM Consult Post Acute Care Choice: [place] arrangements for the past 2 months: Single Family Home Expected Discharge Date: 06/14/20               DME Arranged: N/A DME Agency: NA       HH Arranged: PT           Social Determinants of Health (SDOH) Interventions    Readmission Risk Interventions No flowsheet data found.

## 2020-06-10 NOTE — Progress Notes (Signed)
  Echocardiogram 2D Echocardiogram with 3D has been performed.  [name] M 06/10/2020, 11:13 AM

## 2020-06-10 NOTE — TOC Initial Note (Signed)
Transition of Care [facility]) - Initial/Assessment Note    Patient Details  Name: [name] MRN: [id number] Date of Birth: [birth year]  Transition of Care [facility]) CM/SW Contact:    Bartholomew Crews, RN Phone Number: [phone] 06/10/2020, 11:25 AM  Clinical Narrative:                  Spoke with patient at the bedside to discuss transition plans. PTA home with spouse. Recently initiating peritoneal dialysis with [facility]. No other DME in the home. Previously independent and continuing to drive. PCP in Epic verified as up to date. Discussed recommendations for home health PT. Offered choice of agency and discussed barriers d/t few agencies in network and limited staffing in [place] area. Referral pending for [facility] PT. TOC following for transitioin needs.   Expected Discharge Plan: [place] Barriers to Discharge: Continued Medical Work up   Patient Goals and CMS Choice Patient states their goals for this hospitalization and ongoing recovery are:: home with wife CMS Medicare.gov Compare Post Acute Care list provided to:: Patient Choice offered to / list presented to : Patient  Expected Discharge Plan and Services Expected Discharge Plan: [place] In-house Referral: NA Discharge Planning Services: CM Consult Post Acute Care Choice: [place] arrangements for the past 2 months: Single Family Home Expected Discharge Date: 06/14/20               DME Arranged: N/A DME Agency: NA       HH Arranged: PT          Prior Living Arrangements/Services Living arrangements for the past 2 months: Single Family Home Lives with:: Self,Spouse Patient language and need for interpreter reviewed:: Yes        Need for Family Participation in Patient Care: Yes (Comment) Care giver support system in place?: Yes (comment) Current home services: DME (Peritoneal Dialysis equipment) Criminal Activity/Legal Involvement Pertinent to Current  Situation/Hospitalization: No - Comment as needed  Activities of Daily Living Home Assistive Devices/Equipment: None ADL Screening (condition at time of admission) Patient's cognitive ability adequate to safely complete daily activities?: Yes Is the patient deaf or have difficulty hearing?: No Does the patient have difficulty seeing, even when wearing glasses/contacts?: No Does the patient have difficulty concentrating, remembering, or making decisions?: No Patient able to express need for assistance with ADLs?: Yes Does the patient have difficulty dressing or bathing?: Yes Independently performs ADLs?: No Communication: Independent Dressing (OT): Independent Grooming: Independent Feeding: Independent Bathing: Independent Toileting: Needs assistance Is this a change from baseline?: Pre-admission baseline In/Out Bed: Needs assistance Is this a change from baseline?: Pre-admission baseline Walks in Home: Needs assistance Is this a change from baseline?: Pre-admission baseline Does the patient have difficulty walking or climbing stairs?: Yes Weakness of Legs: Both Weakness of Arms/Hands: None  Permission Sought/Granted                  Emotional Assessment Appearance:: Appears stated age Attitude/Demeanor/Rapport: Engaged Affect (typically observed): Accepting Orientation: : Oriented to Self,Oriented to  Time,Oriented to Place,Oriented to Situation Alcohol / Substance Use: Not Applicable Psych Involvement: No (comment)  Admission diagnosis:  Fever of unknown origin [R50.9] Demand ischemia ([place]) [I24.8] Fever [R50.9] Elevated d-dimer [R79.89] Acute on chronic congestive heart failure, unspecified heart failure type [facility]) [I50.9] Patient Active Problem List   Diagnosis Date Noted  . Severe sepsis without septic shock (CODE) ([place])   . Enterococcal bacteremia   . S/P AVR (  aortic valve replacement)   . Fever 06/08/2020  . MGUS (monoclonal gammopathy of unknown  significance) 02/05/2020  . Essential (primary) hypertension   . Chronic obstructive pulmonary disease, unspecified ([place])   . Hyperlipidemia, unspecified   . Hypothyroidism, unspecified   . Atherosclerotic heart disease of native coronary artery without angina pectoris   . Gout, unspecified   . Major depressive disorder, single episode, unspecified   . Morbid (severe) obesity due to excess calories ([place])   . Nonrheumatic aortic (valve) stenosis   . Orthostatic hypotension   . Hypertension   . Thyroid disease   . Arthritis   . COPD (chronic obstructive pulmonary disease) ([place])   . Heart murmur   . Aortic stenosis   . CAD (coronary artery disease)   . Syncope   . Depression   . Kidney stones    PCP:  Celene Squibb, MD Pharmacy:   [place], [place] [address] Phone: [phone] Fax: [phone]     Social Determinants of Health (SDOH) Interventions    Readmission Risk Interventions No flowsheet data found.

## 2020-06-10 NOTE — Progress Notes (Addendum)
PROGRESS NOTE  [name] EZM:[id number] DOB: Nov 25, 1945 DOA: 06/08/2020 PCP: Celene Squibb, MD   LOS: 2 days   Brief narrative: As per HPI,  [name]  is a 74 y.o. male, with past medical history of COPD, CAD, hypertension, depression, history of aortic valve replacement, hypothyroidism, hyperlipidemia, obesity, MGUS, chronic kidney disease status post peritoneal dialysis catheter on  05/14/2020, and left aVF inserted by Dr. Dixie Dials 50/08/5463 at [facility] who had 5 session of training peritoneal dialysis last week presented to the hospital with fever chills and progressive dyspnea with worsening lower extremity edema with cough and wheezing.  In the ED patient was noted to be hypoxic saturating in the mid 80s on room air, tolerating 2 L nasal cannula. Chest x-ray was significant for diffuse multifocal opacities related to volume overload versus infectious process. Patient was febrile of 101 Fahrenheit, with leukocytosis of 22K, procalcitonin at 1.26, CRP at 16.7, D-dimers at 2.67,  hemoglobin of 8.8. Patient was started on broad-spectrum antibiotics and was admitted to the hospital for further evaluation and treatment.  Assessment/Plan:  Principal Problem:   Enterococcal bacteremia Active Problems:   CAD (coronary artery disease)   Depression   Essential (primary) hypertension   Chronic obstructive pulmonary disease, unspecified (HCC)   Hyperlipidemia, unspecified   Hypothyroidism, unspecified   Atherosclerotic heart disease of native coronary artery without angina pectoris   Morbid (severe) obesity due to excess calories (HCC)   Fever   Severe sepsis without septic shock (CODE) (HCC)   S/P AVR (aortic valve replacement)  Acute hypoxic respiratory failure Likely multifactorial on initial presentation likely secondary to volume overload secondary to advanced kidney disease, acute on chronic diastolic heart failure COPD and possible pneumonia.  Received 80 mg of  p.o. Lasix and IV twice daily with improvement.  Empirically on IV antibiotic for possible pneumonia.  Closely monitor electrolytes.  Chest x-ray from today with decreasing opacities.  Check duplex ultrasound of the lower extremities.  Check 2D echocardiogram.   Enterococcal bacteremia. With fever and leukocytosis.  Follow peritoneal fluid culture.  Check 2D echocardiogram.  On Rocephin and ampicillin.  2D echocardiogram has been ordered and is pending.  History of aortic valve placement.   COPD exacerbation Improving.  Continue IV steroids nebulizers.  Decrease steroid to 40 twice daily at this time   Possible multifocal pneumonia -Patient with fever elevated procalcitonin and multifocal opacity in the chest x-ray.  Currently on IV Rocephin and ampicillin.  We will continue with that.      ESRD with evidence of significant volume overload, nongap metabolic acidosis. Peritoneal dialysis at home for the last 5 days prior to presentation.  Nephrology on board at this time.   Left AV fistula inserted by Dr. Dixie Dials 68/06/2749. [facility] [place] [facility].  Continue oral sodium bicarb.  Continue fluid restriction.  Discontinue IV fluids.  Was on Lasix 40 p.o. twice daily at home.  Continue to hold allopurinol.   anemia of chronic kidney disease, Received IV iron recently as outpatient..  Continue to monitor closely.     Essential hypertension Metoprolol to continue.  Hold Norvasc and hydralazine.  On IV diuretic at this time.   CAD No acute disease.  Continue aspirin, statin and beta-blockers   Hypothyroidism -Continue with Synthroid   BPH -Continue with Proscar   Depression -Continue with home medications   Hyperlipidemia -Continue with home medications  Hypokalemia.  Borderline will  closely monitor.  Check BMP in a.m.  Will give 1 dose of p.o. potassium since the patient is on IV diuretic and sodium bicarb  tablets.  DVT prophylaxis: heparin injection 5,000 Units Start: 06/08/20 2330    Code Status: Full code  Family Communication: None  Status is: Inpatient  Remains inpatient appropriate because:IV treatments appropriate due to intensity of illness or inability to take PO and Inpatient level of care appropriate due to severity of illness, enterococcal bacteremia,   Dispo: The patient is from: Home              Anticipated d/c is to: Home              Anticipated d/c date is: 2 days more.  Follow ID and nephrology recommendation.              Patient currently is not medically stable to d/c.   Consultants: Nephrology Infectious disease  Procedures: None yet  Antibiotics:  Rocephin and ampicillin IV 12/12>  Anti-infectives (From admission, onward)    Start     Dose/Rate Route Frequency Ordered Stop   06/09/20 2200  cefTRIAXone (ROCEPHIN) 2 g in sodium chloride 0.9 % 100 mL IVPB        2 g 200 mL/hr over 30 Minutes Intravenous Every 12 hours 06/09/20 1708     06/09/20 1800  ceFEPIme (MAXIPIME) 1 g in sodium chloride 0.9 % 100 mL IVPB  Status:  Discontinued        1 g 200 mL/hr over 30 Minutes Intravenous Every 24 hours 06/09/20 0835 06/09/20 1455   06/09/20 1600  ampicillin (OMNIPEN) 2 g in sodium chloride 0.9 % 100 mL IVPB        2 g 300 mL/hr over 20 Minutes Intravenous Every 12 hours 06/09/20 1455     06/09/20 0835  vancomycin variable dose per unstable renal function (pharmacist dosing)  Status:  Discontinued         Does not apply See admin instructions 06/09/20 0835 06/09/20 1455   06/08/20 1800  vancomycin (VANCOREADY) IVPB 2000 mg/400 mL        2,000 mg 200 mL/hr over 120 Minutes Intravenous  Once 06/08/20 1700 06/08/20 2007   06/08/20 1715  ceFEPIme (MAXIPIME) 2 g in sodium chloride 0.9 % 100 mL IVPB        2 g 200 mL/hr over 30 Minutes Intravenous  Once 06/08/20 1700 06/08/20 1805        Subjective: Today, patient was seen and examined at bedside.   Patient denies any nausea, vomiting, or abdominal pain.  Has some congestion.  Has mild dry cough.  Objective: Vitals:   06/10/20 0505 06/10/20 0945  BP: 123/72 113/61  Pulse: 90 88  Resp: 20 19  Temp: 98.4 F (36.9 C) 98.3 F (36.8 C)  SpO2: 94% 96%    Intake/Output Summary (Last 24 hours) at 06/10/2020 1055 Last data filed at 06/10/2020 1010 Gross per 24 hour  Intake 495.3 ml  Output 875 ml  Net -379.7 ml   Filed Weights   06/08/20 1327 06/09/20 0147 06/09/20 2111  Weight: 117.9 kg 120.1 kg 120.1 kg   Body mass index is 39.1 kg/m.   Physical Exam: GENERAL: Patient is alert awake and oriented. Not in obvious distress.,  Obese, on nasal cannula oxygen 2 L/min. HENT: No scleral pallor or icterus. Pupils equally reactive to light. Oral mucosa is moist NECK: is supple, no gross swelling noted. CHEST:.  Diminished breath sounds bilaterally.  Coarse breath sounds noted CVS: S1 and S2 heard, no murmur. Regular rate and rhythm.  ABDOMEN: Soft, non-tender, bowel sounds are present.  Peritoneal dialysis catheter in place with dressing., mildly distended EXTREMITIES: Bilateral lower extremity edema noted. CNS: Cranial nerves are intact. No focal motor deficits. SKIN: warm and dry without rashes.  Data Review: I have personally reviewed the following laboratory data and studies,  CBC: Recent Labs  Lab 06/08/20 1332 06/09/20 0415 06/10/20 0517  WBC 22.3* 18.5* 18.5*  NEUTROABS 20.2*  --  17.4*  HGB 8.8* 8.0* 7.8*  HCT 27.1* 25.4* 23.9*  MCV 98.2 97.7 96.4  PLT 224 192 794   Basic Metabolic Panel: Recent Labs  Lab 06/08/20 1621 06/09/20 0415 06/09/20 1012 06/10/20 0517  NA 135 139 138 139  K 3.9 3.7 3.5 3.4*  CL 101 104 104 104  CO2 21* 18* 17* 17*  GLUCOSE 146* 188* 190* 189*  BUN 71* 75* 79* 90*  CREATININE 5.53* 5.72* 6.06* 6.52*  CALCIUM 8.6* 8.8* 8.6* 8.7*   Liver Function Tests: Recent Labs  Lab 06/08/20 1621 06/09/20 1012  AST 24 27  ALT 14 13   ALKPHOS 56 49  BILITOT 0.6 0.6  PROT 6.9 6.1*  ALBUMIN 3.1* 2.4*   No results for input(s): LIPASE, AMYLASE in the last 168 hours. No results for input(s): AMMONIA in the last 168 hours. Cardiac Enzymes: No results for input(s): CKTOTAL, CKMB, CKMBINDEX, TROPONINI in the last 168 hours. BNP (last 3 results) Recent Labs    06/08/20 1647  BNP 2,673.0*    ProBNP (last 3 results) No results for input(s): PROBNP in the last 8760 hours.  CBG: No results for input(s): GLUCAP in the last 168 hours. Recent Results (from the past 240 hour(s))  Blood Culture (routine x 2)     Status: None (Preliminary result)   Collection Time: 06/08/20  4:21 PM   Specimen: BLOOD  Result Value Ref Range Status   Specimen Description   Final    BLOOD Performed at [facility], [address] [place], [place] [address]    Special Requests   Final    NONE Performed at [facility], [address] [place], [place] [address]    Culture  Setup Time   Final    GRAM POSITIVE COCCI Gram Stain Report Called to,Read Back By and Verified With: HEATHER CRAWFORD,RN @0741  06/09/2020 KAY IN BOTH AEROBIC AND ANAEROBIC BOTTLES Performed at [facility], [place] [address] [place], [place] [address]    Culture GRAM POSITIVE COCCI  Final   Report Status PENDING  Incomplete  Blood Culture (routine x 2)     Status: Abnormal (Preliminary result)   Collection Time: 06/08/20  4:26 PM   Specimen: BLOOD  Result Value Ref Range Status   Specimen Description   Final    BLOOD Performed at [facility], [address] [place], [place] [address]    Special Requests   Final    NONE Performed at [facility], [address] [place], [place] [address]    Culture  Setup Time   Final    GRAM POSITIVE COCCI ANAEROBIC BOTTLE ONLY Gram Stain Report Called to,Read Back By and Verified With: HEATHER CRAWFORD,RN @0741  06/09/2020 KAY GRAM POSITIVE COCCI AEROBIC BOTTLE ONLY Organism ID to follow CRITICAL RESULT CALLED TO,  READ BACK BY AND VERIFIED WITH: Alycia Patten [id number] MLM Performed at [facility], [place] [address], [place], [place] [address]    Culture  ENTEROCOCCUS FAECALIS (A)  Final   Report Status PENDING  Incomplete  Blood Culture ID Panel (Reflexed)     Status: Abnormal   Collection Time: 06/08/20  4:26 PM  Result Value Ref Range Status   Enterococcus faecalis DETECTED (A) NOT DETECTED Final    Comment: CRITICAL RESULT CALLED TO, READ BACK BY AND VERIFIED WITH: PHARMD K PIERCE [id number] MLM    Enterococcus Faecium NOT DETECTED NOT DETECTED Final   Listeria monocytogenes NOT DETECTED NOT DETECTED Final   Staphylococcus species NOT DETECTED NOT DETECTED Final   Staphylococcus aureus (BCID) NOT DETECTED NOT DETECTED Final   Staphylococcus epidermidis NOT DETECTED NOT DETECTED Final   Staphylococcus lugdunensis NOT DETECTED NOT DETECTED Final   Streptococcus species NOT DETECTED NOT DETECTED Final   Streptococcus agalactiae NOT DETECTED NOT DETECTED Final   Streptococcus pneumoniae NOT DETECTED NOT DETECTED Final   Streptococcus pyogenes NOT DETECTED NOT DETECTED Final   A.calcoaceticus-baumannii NOT DETECTED NOT DETECTED Final   Bacteroides fragilis NOT DETECTED NOT DETECTED Final   Enterobacterales NOT DETECTED NOT DETECTED Final   Enterobacter cloacae complex NOT DETECTED NOT DETECTED Final   Escherichia coli NOT DETECTED NOT DETECTED Final   Klebsiella aerogenes NOT DETECTED NOT DETECTED Final   Klebsiella oxytoca NOT DETECTED NOT DETECTED Final   Klebsiella pneumoniae NOT DETECTED NOT DETECTED Final   Proteus species NOT DETECTED NOT DETECTED Final   Salmonella species NOT DETECTED NOT DETECTED Final   Serratia marcescens NOT DETECTED NOT DETECTED Final   Haemophilus influenzae NOT DETECTED NOT DETECTED Final   Neisseria meningitidis NOT DETECTED NOT DETECTED Final   Pseudomonas aeruginosa NOT DETECTED NOT DETECTED Final   Stenotrophomonas maltophilia NOT DETECTED NOT  DETECTED Final   Candida albicans NOT DETECTED NOT DETECTED Final   Candida auris NOT DETECTED NOT DETECTED Final   Candida glabrata NOT DETECTED NOT DETECTED Final   Candida krusei NOT DETECTED NOT DETECTED Final   Candida parapsilosis NOT DETECTED NOT DETECTED Final   Candida tropicalis NOT DETECTED NOT DETECTED Final   Cryptococcus neoformans/gattii NOT DETECTED NOT DETECTED Final   Vancomycin resistance NOT DETECTED NOT DETECTED Final    Comment: Performed at [facility], [address] [place] [address], [place], [place] [address]  Resp Panel by RT-PCR (Flu A&B, Covid) Nasopharyngeal Swab     Status: None   Collection Time: 06/08/20  5:05 PM   Specimen: Nasopharyngeal Swab; Nasopharyngeal(NP) swabs in vial transport medium  Result Value Ref Range Status   SARS Coronavirus 2 by RT PCR NEGATIVE NEGATIVE Final    Comment: (NOTE) SARS-CoV-2 target nucleic acids are NOT DETECTED.  The SARS-CoV-2 RNA is generally detectable in upper respiratory specimens during the acute phase of infection. The lowest concentration of SARS-CoV-2 viral copies this assay can detect is 138 copies/mL. A negative result does not preclude SARS-Cov-2 infection and should not be used as the sole basis for treatment or other patient management decisions. A negative result may occur with  improper specimen collection/handling, submission of specimen other than nasopharyngeal swab, presence of viral mutation(s) within the areas targeted by this assay, and inadequate number of viral copies(<138 copies/mL). A negative result must be combined with clinical observations, patient history, and epidemiological information. The expected result is Negative.  Fact Sheet for Patients:  EntrepreneurPulse.com.au  Fact Sheet for Healthcare Providers:  IncredibleEmployment.be  This test is no t yet approved or cleared by the [place] FDA and  has been authorized for detection and/or diagnosis  of SARS-CoV-2 by FDA  under an Emergency Use Authorization (EUA). This EUA will remain  in effect (meaning this test can be used) for the duration of the COVID-19 declaration under Section 564(b)(1) of the Act, 21 U.S.C.section 360bbb-3(b)(1), unless the authorization is terminated  or revoked sooner.       Influenza A by PCR NEGATIVE NEGATIVE Final   Influenza B by PCR NEGATIVE NEGATIVE Final    Comment: (NOTE) The Xpert Xpress SARS-CoV-2/FLU/RSV plus assay is intended as an aid in the diagnosis of influenza from Nasopharyngeal swab specimens and should not be used as a sole basis for treatment. Nasal washings and aspirates are unacceptable for Xpert Xpress SARS-CoV-2/FLU/RSV testing.  Fact Sheet for Patients: EntrepreneurPulse.com.au  Fact Sheet for Healthcare Providers: IncredibleEmployment.be  This test is not yet approved or cleared by the [place] FDA and has been authorized for detection and/or diagnosis of SARS-CoV-2 by FDA under an Emergency Use Authorization (EUA). This EUA will remain in effect (meaning this test can be used) for the duration of the COVID-19 declaration under Section 564(b)(1) of the Act, 21 U.S.C. section 360bbb-3(b)(1), unless the authorization is terminated or revoked.  Performed at [facility], [address] [place], [place] [address]   Culture, body fluid-bottle     Status: None (Preliminary result)   Collection Time: 06/10/20  3:44 AM   Specimen: Peritoneal Dialysate  Result Value Ref Range Status   Specimen Description PERITONEAL DIALYSATE FLUID  Final   Special Requests   Final    BOTTLES DRAWN AEROBIC AND ANAEROBIC Blood Culture adequate volume Performed at [facility] [address], [place], [place] [address]    Culture PENDING  Incomplete   Report Status PENDING  Incomplete  Gram stain     Status: None   Collection Time: 06/10/20  3:44 AM   Specimen: Peritoneal Dialysate  Result  Value Ref Range Status   Specimen Description PERITONEAL DIALYSATE FLUID  Final   Special Requests NONE  Final   Gram Stain   Final    WBC PRESENT,BOTH PMN AND MONONUCLEAR NO ORGANISMS SEEN CYTOSPIN SMEAR Performed at [facility], [address], [place], [place] [address]    Report Status 06/10/2020 FINAL  Final     Studies: DG Chest 1 View  Result Date: 06/10/2020 CLINICAL DATA:  74 year old male with history of pneumonia, shortness of breath, fever. EXAM: CHEST  1 VIEW COMPARISON:  06/08/2020 FINDINGS: The radiograph is under exposed. Unchanged cardiomegaly. Atherosclerotic calcification of the aortic arch. Global decrease in previously visualized patchy diffuse pulmonary opacities. No significant pleural effusions. No pneumothorax. No new consolidative opacities. Median sternotomy wires remain intact. No acute osseous abnormality. IMPRESSION: Interval decrease in previously visualized diffuse patchy pulmonary opacities suggestive of improving pulmonary edema versus improving multifocal pneumonia. Unchanged cardiomegaly. Electronically Signed   By: Ruthann Cancer MD   On: 06/10/2020 08:16   DG Chest Portable 1 View  Result Date: 06/08/2020 CLINICAL DATA:  74 year old male with shortness of breath and chest pain EXAM: PORTABLE CHEST 1 VIEW COMPARISON:  08/05/2012 FINDINGS: Mild cardiomegaly, unchanged. Atherosclerotic calcification of the aortic arch. 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 Scattered diffuse patchy pulmonary opacities with a basal predominance. No significant pleural effusion. No pneumothorax. Median sternotomy wires remain in place. No acute osseous abnormality. IMPRESSION: Diffuse bilateral mid lung and lower lobe predominant patchy pulmonary opacities, most compatible with multifocal pneumonia in the setting of fever. Pulmonary edema could appear similarly. Electronically Signed   By: Ruthann Cancer MD  On: 06/08/2020 14:10      Flora Lipps, MD  [facility] 06/10/2020

## 2020-06-10 NOTE — Progress Notes (Signed)
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 Blood cultures from 12/13 are without growth in <12 hours. Await TTE results and will likely need TEE. Current potential sources of infection include PD catheter access site, new fistula located in left arm or endocarditis. No clear source of infection from any of these sites at present. Will continue with ceftriaxone and ampicillin for endocarditis pending further work up.  PLAN:  1. Continue ceftriaxone and ampicillin.  2. Await TTE results and possible TEE.  Principal Problem:   Enterococcal bacteremia Active Problems:   CAD (coronary artery disease)   Depression   Essential (primary) hypertension   Chronic obstructive pulmonary disease, unspecified (HCC)   Hyperlipidemia, unspecified   Hypothyroidism, unspecified   Atherosclerotic heart disease of native coronary artery without angina pectoris   Morbid (severe) obesity due to excess calories (HCC)   Fever   Severe sepsis without septic shock (CODE) (HCC)   S/P AVR (aortic valve replacement)   . aspirin  325 mg Oral Daily  . calcitRIOL  0.25 mcg Oral QODAY  . cholecalciferol  2,000 Units Oral QHS  . cholecalciferol  4,000 Units Oral Daily  . docusate sodium  100 mg Oral BID  . finasteride  5 mg Oral Daily  . fluticasone  1 spray Each Nare Daily  . furosemide  40 mg Intravenous BID  . heparin  5,000 Units Subcutaneous Q8H  . ipratropium-albuterol  3 mL Nebulization Q6H  . latanoprost  1 drop Both Eyes QHS  . levothyroxine  175 mcg Oral Q0600  . mouth rinse  15 mL Mouth Rinse BID  . methylPREDNISolone (SOLU-MEDROL) injection  40 mg Intravenous Q12H  . metoprolol tartrate  25 mg Oral BID  . multivitamin  1 tablet Oral Daily  . omega-3 acid ethyl esters   1 g Oral BID  . pantoprazole  40 mg Oral Daily  . polyethylene glycol  17 g Oral Daily  . senna-docusate  1 tablet Oral BID  . sertraline  100 mg Oral Daily  . simvastatin  40 mg Oral QPM  . sodium bicarbonate  1,300 mg Oral TID WC    SUBJECTIVE:  Afebrile overnight with no acute events. Receiving echocardiogram during visit.   No Known Allergies   Review of Systems: Review of Systems  Constitutional: Negative for chills, fever and weight loss.  Respiratory: Negative for cough, shortness of breath and wheezing.   Cardiovascular: Negative for chest pain and leg swelling.  Gastrointestinal: Negative for abdominal pain, constipation, diarrhea, nausea and vomiting.  Skin: Negative for rash.      OBJECTIVE: Vitals:   06/09/20 2111 06/10/20 0215 06/10/20 0505 06/10/20 0945  BP: 111/64  123/72 113/61  Pulse: 99  90 88  Resp: 20  20 19   Temp: 98.1 F (36.7 C)  98.4 F (36.9 C) 98.3 F (36.8 C)  TempSrc: Oral     SpO2: 92% 98% 94% 96%  Weight: 120.1 kg     Height:       Body mass index is 39.1 kg/m.  Physical Exam Constitutional:      General: He is not in acute distress.    Appearance: He is well-developed and well-nourished.  Cardiovascular:     Rate and Rhythm: Normal rate and  regular rhythm.     Pulses: Intact distal pulses.     Heart sounds: Normal heart sounds.  Pulmonary:     Effort: Pulmonary effort is normal.     Breath sounds: Normal breath sounds.  Skin:    General: Skin is warm and dry.  Neurological:     Mental Status: He is alert and oriented to person, place, and time.  Psychiatric:        Mood and Affect: Mood and affect normal.        Behavior: Behavior normal.        Thought Content: Thought content normal.        Judgment: Judgment normal.     Lab Results Lab Results  Component Value Date   WBC 18.5 (H) 06/10/2020   HGB 7.8 (L) 06/10/2020   HCT 23.9 (L) 06/10/2020   MCV 96.4 06/10/2020   PLT 213 06/10/2020    Lab Results   Component Value Date   CREATININE 6.52 (H) 06/10/2020   BUN 90 (H) 06/10/2020   NA 139 06/10/2020   K 3.4 (L) 06/10/2020   CL 104 06/10/2020   CO2 17 (L) 06/10/2020    Lab Results  Component Value Date   ALT 13 06/09/2020   AST 27 06/09/2020   ALKPHOS 49 06/09/2020   BILITOT 0.6 06/09/2020     Microbiology: Recent Results (from the past 240 hour(s))  Blood Culture (routine x 2)     Status: None (Preliminary result)   Collection Time: 06/08/20  4:21 PM   Specimen: BLOOD  Result Value Ref Range Status   Specimen Description   Final    BLOOD Performed at [facility], [address], [place], [place] [address]    Special Requests   Final    NONE Performed at [facility], [address] [place], [place] [address]    Culture  Setup Time   Final    GRAM POSITIVE COCCI Gram Stain Report Called to,Read Back By and Verified With: HEATHER CRAWFORD,RN @0741  06/09/2020 KAY IN BOTH AEROBIC AND ANAEROBIC BOTTLES Performed at [facility] [address], [place], [place] [address]    Culture GRAM POSITIVE COCCI  Final   Report Status PENDING  Incomplete  Blood Culture (routine x 2)     Status: Abnormal (Preliminary result)   Collection Time: 06/08/20  4:26 PM   Specimen: BLOOD  Result Value Ref Range Status   Specimen Description   Final    BLOOD Performed at [facility], [address], [place], [place] [address]    Special Requests   Final    NONE Performed at [facility], [address] [place], [place] [address]    Culture  Setup Time   Final    GRAM POSITIVE COCCI ANAEROBIC BOTTLE ONLY Gram Stain Report Called to,Read Back By and Verified With: HEATHER CRAWFORD,RN @0741  06/09/2020 KAY GRAM POSITIVE COCCI AEROBIC BOTTLE ONLY Organism ID to follow CRITICAL RESULT CALLED TO, READ BACK BY AND VERIFIED WITH: Alycia Patten [id number] MLM Performed at [facility], [place] [address], [place], [place] [address]    Culture ENTEROCOCCUS FAECALIS (A)  Final    Report Status PENDING  Incomplete  Blood Culture ID Panel (Reflexed)     Status: Abnormal   Collection Time: 06/08/20  4:26 PM  Result Value Ref Range Status   Enterococcus faecalis DETECTED (A) NOT DETECTED Final    Comment: CRITICAL RESULT CALLED TO, READ BACK BY AND VERIFIED WITH:  PHARMD K PIERCE [id number] MLM    Enterococcus Faecium NOT DETECTED NOT DETECTED Final   Listeria monocytogenes NOT DETECTED NOT DETECTED Final   Staphylococcus species NOT DETECTED NOT DETECTED Final   Staphylococcus aureus (BCID) NOT DETECTED NOT DETECTED Final   Staphylococcus epidermidis NOT DETECTED NOT DETECTED Final   Staphylococcus lugdunensis NOT DETECTED NOT DETECTED Final   Streptococcus species NOT DETECTED NOT DETECTED Final   Streptococcus agalactiae NOT DETECTED NOT DETECTED Final   Streptococcus pneumoniae NOT DETECTED NOT DETECTED Final   Streptococcus pyogenes NOT DETECTED NOT DETECTED Final   A.calcoaceticus-baumannii NOT DETECTED NOT DETECTED Final   Bacteroides fragilis NOT DETECTED NOT DETECTED Final   Enterobacterales NOT DETECTED NOT DETECTED Final   Enterobacter cloacae complex NOT DETECTED NOT DETECTED Final   Escherichia coli NOT DETECTED NOT DETECTED Final   Klebsiella aerogenes NOT DETECTED NOT DETECTED Final   Klebsiella oxytoca NOT DETECTED NOT DETECTED Final   Klebsiella pneumoniae NOT DETECTED NOT DETECTED Final   Proteus species NOT DETECTED NOT DETECTED Final   Salmonella species NOT DETECTED NOT DETECTED Final   Serratia marcescens NOT DETECTED NOT DETECTED Final   Haemophilus influenzae NOT DETECTED NOT DETECTED Final   Neisseria meningitidis NOT DETECTED NOT DETECTED Final   Pseudomonas aeruginosa NOT DETECTED NOT DETECTED Final   Stenotrophomonas maltophilia NOT DETECTED NOT DETECTED Final   Candida albicans NOT DETECTED NOT DETECTED Final   Candida auris NOT DETECTED NOT DETECTED Final   Candida glabrata NOT DETECTED NOT DETECTED Final   Candida krusei NOT  DETECTED NOT DETECTED Final   Candida parapsilosis NOT DETECTED NOT DETECTED Final   Candida tropicalis NOT DETECTED NOT DETECTED Final   Cryptococcus neoformans/gattii NOT DETECTED NOT DETECTED Final   Vancomycin resistance NOT DETECTED NOT DETECTED Final    Comment: Performed at [facility], [address], [place], [place] [address]  Resp Panel by RT-PCR (Flu A&B, Covid) Nasopharyngeal Swab     Status: None   Collection Time: 06/08/20  5:05 PM   Specimen: Nasopharyngeal Swab; Nasopharyngeal(NP) swabs in vial transport medium  Result Value Ref Range Status   SARS Coronavirus 2 by RT PCR NEGATIVE NEGATIVE Final    Comment: (NOTE) SARS-CoV-2 target nucleic acids are NOT DETECTED.  The SARS-CoV-2 RNA is generally detectable in upper respiratory specimens during the acute phase of infection. The lowest concentration of SARS-CoV-2 viral copies this assay can detect is 138 copies/mL. A negative result does not preclude SARS-Cov-2 infection and should not be used as the sole basis for treatment or other patient management decisions. A negative result may occur with  improper specimen collection/handling, submission of specimen other than nasopharyngeal swab, presence of viral mutation(s) within the areas targeted by this assay, and inadequate number of viral copies(<138 copies/mL). A negative result must be combined with clinical observations, patient history, and epidemiological information. The expected result is Negative.  Fact Sheet for Patients:  EntrepreneurPulse.com.au  Fact Sheet for Healthcare Providers:  IncredibleEmployment.be  This test is no t yet approved or cleared by the [place] FDA and  has been authorized for detection and/or diagnosis of SARS-CoV-2 by FDA under an Emergency Use Authorization (EUA). This EUA will remain  in effect (meaning this test can be used) for the duration of the COVID-19 declaration under Section  564(b)(1) of the Act, 21 U.S.C.section 360bbb-3(b)(1), unless the authorization is terminated  or revoked sooner.       Influenza A by PCR NEGATIVE NEGATIVE Final   Influenza B  by PCR NEGATIVE NEGATIVE Final    Comment: (NOTE) The Xpert Xpress SARS-CoV-2/FLU/RSV plus assay is intended as an aid in the diagnosis of influenza from Nasopharyngeal swab specimens and should not be used as a sole basis for treatment. Nasal washings and aspirates are unacceptable for Xpert Xpress SARS-CoV-2/FLU/RSV testing.  Fact Sheet for Patients: EntrepreneurPulse.com.au  Fact Sheet for Healthcare Providers: IncredibleEmployment.be  This test is not yet approved or cleared by the [place] FDA and has been authorized for detection and/or diagnosis of SARS-CoV-2 by FDA under an Emergency Use Authorization (EUA). This EUA will remain in effect (meaning this test can be used) for the duration of the COVID-19 declaration under Section 564(b)(1) of the Act, 21 U.S.C. section 360bbb-3(b)(1), unless the authorization is terminated or revoked.  Performed at [facility], [address] [place], [place] [address]   Culture, body fluid-bottle     Status: None (Preliminary result)   Collection Time: 06/10/20  3:44 AM   Specimen: Peritoneal Dialysate  Result Value Ref Range Status   Specimen Description PERITONEAL DIALYSATE FLUID  Final   Special Requests   Final    BOTTLES DRAWN AEROBIC AND ANAEROBIC Blood Culture adequate volume   Culture   Final    NO GROWTH < 12 HOURS Performed at [facility] [address], [place], [place] [address]    Report Status PENDING  Incomplete  Gram stain     Status: None   Collection Time: 06/10/20  3:44 AM   Specimen: Peritoneal Dialysate  Result Value Ref Range Status   Specimen Description PERITONEAL DIALYSATE FLUID  Final   Special Requests NONE  Final   Gram Stain   Final    WBC PRESENT,BOTH PMN AND MONONUCLEAR NO  ORGANISMS SEEN CYTOSPIN SMEAR Performed at [facility], [address], [place], [place] [address]    Report Status 06/10/2020 FINAL  Final  Culture, blood (routine x 2)     Status: None (Preliminary result)   Collection Time: 06/10/20  5:17 AM   Specimen: BLOOD RIGHT HAND  Result Value Ref Range Status   Specimen Description BLOOD RIGHT HAND  Final   Special Requests   Final    BOTTLES DRAWN AEROBIC ONLY Blood Culture adequate volume   Culture   Final    NO GROWTH < 12 HOURS Performed at [facility], [place] [address], [place], [place] [address]    Report Status PENDING  Incomplete  Culture, blood (routine x 2)     Status: None (Preliminary result)   Collection Time: 06/10/20  5:17 AM   Specimen: BLOOD RIGHT HAND  Result Value Ref Range Status   Specimen Description BLOOD RIGHT HAND  Final   Special Requests   Final    BOTTLES DRAWN AEROBIC ONLY Blood Culture results may not be optimal due to an inadequate volume of blood received in culture bottles   Culture   Final    NO GROWTH < 12 HOURS Performed at [facility], [place] [address] [place], [place] [address]    Report Status PENDING  Incomplete     Terri Piedra, [facility]  06/10/2020  1:50 PM

## 2020-06-11 DIAGNOSIS — I509 Heart failure, unspecified: Secondary | ICD-10-CM

## 2020-06-11 DIAGNOSIS — I248 Other forms of acute ischemic heart disease: Secondary | ICD-10-CM

## 2020-06-11 DIAGNOSIS — R7989 Other specified abnormal findings of blood chemistry: Secondary | ICD-10-CM

## 2020-06-11 LAB — CBC
HCT: 24.8 % — ABNORMAL LOW (ref 39.0–52.0)
Hemoglobin: 7.8 g/dL — ABNORMAL LOW (ref 13.0–17.0)
MCH: 30.8 pg (ref 26.0–34.0)
MCHC: 31.5 g/dL (ref 30.0–36.0)
MCV: 98 fL (ref 80.0–100.0)
Platelets: 230 10*3/uL (ref 150–400)
RBC: 2.53 MIL/uL — ABNORMAL LOW (ref 4.22–5.81)
RDW: 14.4 % (ref 11.5–15.5)
WBC: 19.1 10*3/uL — ABNORMAL HIGH (ref 4.0–10.5)
nRBC: 0.7 % — ABNORMAL HIGH (ref 0.0–0.2)

## 2020-06-11 LAB — CULTURE, BLOOD (ROUTINE X 2)

## 2020-06-11 LAB — COMPREHENSIVE METABOLIC PANEL
ALT: 20 U/L (ref 0–44)
AST: 36 U/L (ref 15–41)
Albumin: 2.5 g/dL — ABNORMAL LOW (ref 3.5–5.0)
Alkaline Phosphatase: 48 U/L (ref 38–126)
Anion gap: 16 — ABNORMAL HIGH (ref 5–15)
BUN: 105 mg/dL — ABNORMAL HIGH (ref 8–23)
CO2: 19 mmol/L — ABNORMAL LOW (ref 22–32)
Calcium: 8.7 mg/dL — ABNORMAL LOW (ref 8.9–10.3)
Chloride: 104 mmol/L (ref 98–111)
Creatinine, Ser: 6.85 mg/dL — ABNORMAL HIGH (ref 0.61–1.24)
GFR, Estimated: 8 mL/min — ABNORMAL LOW (ref >60)
Glucose, Bld: 182 mg/dL — ABNORMAL HIGH (ref 70–99)
Potassium: 4.1 mmol/L (ref 3.5–5.1)
Sodium: 139 mmol/L (ref 135–145)
Total Bilirubin: 0.5 mg/dL (ref 0.3–1.2)
Total Protein: 6.1 g/dL — ABNORMAL LOW (ref 6.5–8.1)

## 2020-06-11 LAB — PHOSPHORUS: Phosphorus: 6.1 mg/dL — ABNORMAL HIGH (ref 2.5–4.6)

## 2020-06-11 LAB — HEPARIN LEVEL (UNFRACTIONATED): Heparin Unfractionated: 0.66 IU/mL (ref 0.30–0.70)

## 2020-06-11 LAB — MAGNESIUM: Magnesium: 2.2 mg/dL (ref 1.7–2.4)

## 2020-06-11 MED ORDER — HEPARIN (PORCINE) 25000 UT/250ML-% IV SOLN
1650.0000 [IU]/h | INTRAVENOUS | Status: DC
  Administered 2020-06-11 – 2020-06-13 (×4): 1650 [IU]/h via INTRAVENOUS
  Filled 2020-06-11 (×4): qty 250

## 2020-06-11 MED ORDER — IPRATROPIUM-ALBUTEROL 0.5-2.5 (3) MG/3ML IN SOLN
3.0000 mL | RESPIRATORY_TRACT | Status: DC | PRN
  Administered 2020-06-12 (×3): 3 mL via RESPIRATORY_TRACT
  Filled 2020-06-11 (×4): qty 3

## 2020-06-11 MED ORDER — METHYLPREDNISOLONE SODIUM SUCC 40 MG IJ SOLR
40.0000 mg | INTRAMUSCULAR | Status: DC
  Administered 2020-06-12: 40 mg via INTRAVENOUS
  Filled 2020-06-11: qty 1

## 2020-06-11 MED ORDER — HEPARIN BOLUS VIA INFUSION
4000.0000 [IU] | Freq: Once | INTRAVENOUS | Status: AC
  Administered 2020-06-11: 4000 [IU] via INTRAVENOUS
  Filled 2020-06-11: qty 4000

## 2020-06-11 NOTE — Progress Notes (Signed)
ANTICOAGULATION CONSULT NOTE - Initial Consult  Pharmacy Consult for Heparin Indication: pulmonary embolus  No Known Allergies  Patient Measurements: Height: 5\' 9"  (175.3 cm) Weight: 125.6 kg (276 lb 14.4 oz) IBW/kg (Calculated) : 70.7 Heparin Dosing Weight: 97.9  Vital Signs: Temp: 98.1 F (36.7 C) (12/14 0434) Temp Source: Oral (12/14 0434) BP: 123/67 (12/14 0434) Pulse Rate: 79 (12/14 0434)  Labs: Recent Labs    06/08/20 1532 06/08/20 1621 06/09/20 0415 06/09/20 1012 06/10/20 0517 06/11/20 0125  HGB  --   --  8.0*  --  7.8* 7.8*  HCT  --   --  25.4*  --  23.9* 24.8*  PLT  --   --  192  --  213 230  CREATININE  --    < > 5.72* 6.06* 6.52* 6.85*  TROPONINIHS 356*  --   --   --   --   --    < > = values in this interval not displayed.    Estimated Creatinine Clearance: 12.2 mL/min (A) (by C-G formula based on SCr of 6.85 mg/dL (H)).   Medical History: Past Medical History:  Diagnosis Date   Aortic stenosis    Arthritis    Atherosclerotic heart disease of native coronary artery without angina pectoris    CAD (coronary artery disease)    Chronic obstructive pulmonary disease, unspecified (HCC)    COPD (chronic obstructive pulmonary disease) (HCC)    Depression    Essential (primary) hypertension    Gout, unspecified    Heart murmur    Hemorrhoid    Hyperlipidemia, unspecified    Hypertension    Hypothyroidism    Hypothyroidism, unspecified    Kidney stones    Major depressive disorder, single episode, unspecified    Morbid (severe) obesity due to excess calories (HCC)    Nonrheumatic aortic (valve) stenosis    Orthostatic hypotension    Syncope    Thyroid disease      Assessment: 74 yo male with ESRD on PD with recent AVF placement. Patient being treated for entercoccal bacteremia with possibility of endocarditis. VQ scan conducted which showed "bilateral scattered segmental to subsegmental perfusion defects right more than left  worrisome for pulmonary emboli". MD wishes to start heparin drip. No AC PTA. Patient has been receiving heparin sq for VTE px. Pharmacy consulted to dose heparin. Patient received sq heparin at ~0520 this AM  Goal of Therapy:  Heparin level 0.3-0.7 units/ml Monitor platelets by anticoagulation protocol: Yes   Plan:  D/c sq heparin Heparin 4000 units IV x 1 bolus Heparin drip at 1650 units/hr Heparin level in 8 hours Daily heparin level, CBC    Aldred Mase A. Levada Dy, PharmD, BCPS, FNKF Clinical Pharmacist [place] Please utilize Amion for appropriate phone number to reach the unit pharmacist ([place])   06/11/2020,8:53 AM

## 2020-06-11 NOTE — Progress Notes (Signed)
[facility]  Date of Admission:  06/08/2020     Total days of antibiotics 4         ASSESSMENT:  [name] had elevated D-dimer with resulting V/Q scan with concern for pulmonary emboli. 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 Bioprosthetic aortic valve nor heavily calcified mitral valve/annuus were visualized well on TTE to rule out endocarditis and will need TEE. Blood cultures from 12/13 remain without growth to date. No other clear source of infection has presented itself. Given findings of pulmonary emboli it would be prudent to treat for endocarditis with ceftriaxone and ampicillin for at least 6 weeks. TEE has been ordered and will continue with ceftriaxone and ampicillin. Will need central line placed once cultures are cleared.   PLAN:  1. Continue current dose of ceftriaxone and ampicillin. 2. TEE to evaluate for endocarditis.  3. Monitor blood cultures for clearance of bacteremia.  4. Will need central line placed for prolonged antibiotics (hold until cultures are clear).   Principal Problem:   Enterococcal bacteremia Active Problems:   CAD (coronary artery disease)   Depression   Essential (primary) hypertension   Chronic obstructive pulmonary disease, unspecified (HCC)   Hyperlipidemia, unspecified   Hypothyroidism, unspecified   Atherosclerotic heart disease of native coronary artery without angina pectoris   Morbid (severe) obesity due to excess calories (HCC)   Fever   Severe sepsis without septic shock (CODE) (HCC)   S/P AVR (aortic valve replacement)   Acute on chronic congestive heart failure ([place])   Demand ischemia ([place])   . aspirin  325 mg Oral Daily  . calcitRIOL  0.25 mcg Oral QODAY  . cholecalciferol  2,000 Units Oral QHS  . cholecalciferol  4,000 Units Oral Daily  . docusate sodium  100 mg Oral BID  . finasteride  5 mg Oral Daily  . fluticasone  1 spray Each Nare  Daily  . furosemide  40 mg Intravenous BID  . gentamicin cream   Topical Daily  . ipratropium-albuterol  3 mL Nebulization Q6H  . latanoprost  1 drop Both Eyes QHS  . levothyroxine  175 mcg Oral Q0600  . mouth rinse  15 mL Mouth Rinse BID  . methylPREDNISolone (SOLU-MEDROL) injection  40 mg Intravenous Q12H  . metoprolol tartrate  25 mg Oral BID  . multivitamin  1 tablet Oral Daily  . omega-3 acid ethyl esters  1 g Oral BID  . pantoprazole  40 mg Oral Daily  . polyethylene glycol  17 g Oral Daily  . senna-docusate  1 tablet Oral BID  . sertraline  100 mg Oral Daily  . simvastatin  40 mg Oral QPM  . sodium bicarbonate  1,300 mg Oral TID WC    SUBJECTIVE:  Afebrile overnight with no acute events. Elevated d-dimer with subsequent V/Q scan with concern for pulmonary emboli. Wife at beside. No current pains.   No Known Allergies   Review of Systems: Review of Systems  Constitutional: Negative for chills, fever and weight loss.  Respiratory: Negative for cough, shortness of breath and wheezing.   Cardiovascular: Negative for chest pain and leg swelling.  Gastrointestinal: Negative for abdominal pain, constipation, diarrhea, nausea and vomiting.  Skin: Negative for rash.      OBJECTIVE: Vitals:   06/10/20 2013 06/10/20 2021 06/11/20 0244 06/11/20 0434  BP:  123/65  123/67  Pulse:  85  79  Resp:  20  18  Temp:  98.7 F (37.1 C)  98.1 F (36.7 C)  TempSrc:  Oral  Oral  SpO2: 96% 94% 94% 95%  Weight:  125.6 kg    Height:       Body mass index is 40.89 kg/m.  Physical Exam Constitutional:      General: He is not in acute distress.    Appearance: He is well-developed and well-nourished. He is obese.     Comments: Seated in chair beside bed; pleasant.   Cardiovascular:     Rate and Rhythm: Normal rate and regular rhythm.     Pulses: Intact distal pulses.     Heart sounds: Normal heart sounds.  Pulmonary:     Effort: Pulmonary effort is normal.     Breath sounds:  Normal breath sounds.  Skin:    General: Skin is warm and dry.  Neurological:     Mental Status: He is alert and oriented to person, place, and time.  Psychiatric:        Mood and Affect: Mood and affect normal.        Behavior: Behavior normal.        Thought Content: Thought content normal.        Judgment: Judgment normal.     Lab Results Lab Results  Component Value Date   WBC 19.1 (H) 06/11/2020   HGB 7.8 (L) 06/11/2020   HCT 24.8 (L) 06/11/2020   MCV 98.0 06/11/2020   PLT 230 06/11/2020    Lab Results  Component Value Date   CREATININE 6.85 (H) 06/11/2020   BUN 105 (H) 06/11/2020   NA 139 06/11/2020   K 4.1 06/11/2020   CL 104 06/11/2020   CO2 19 (L) 06/11/2020    Lab Results  Component Value Date   ALT 20 06/11/2020   AST 36 06/11/2020   ALKPHOS 48 06/11/2020   BILITOT 0.5 06/11/2020     Microbiology: Recent Results (from the past 240 hour(s))  Blood Culture (routine x 2)     Status: Abnormal   Collection Time: 06/08/20  4:21 PM   Specimen: BLOOD  Result Value Ref Range Status   Specimen Description   Final    BLOOD Performed at [facility], [address] [place], [place] [address]    Special Requests   Final    NONE Performed at [facility], [address] [place], [place] [address]    Culture  Setup Time   Final    GRAM POSITIVE COCCI Gram Stain Report Called to,Read Back By and Verified With: HEATHER CRAWFORD,RN @0741  06/09/2020 KAY IN BOTH AEROBIC AND ANAEROBIC BOTTLES    Culture (A)  Final    ENTEROCOCCUS FAECALIS SUSCEPTIBILITIES PERFORMED ON PREVIOUS CULTURE WITHIN THE LAST 5 DAYS. Performed at [facility], [place] [address], [place], [place] [address]    Report Status 06/11/2020 FINAL  Final  Blood Culture (routine x 2)     Status: Abnormal   Collection Time: 06/08/20  4:26 PM   Specimen: BLOOD  Result Value Ref Range Status   Specimen Description   Final    BLOOD Performed at [facility], [address], [place], [place]  [address]    Special Requests   Final    NONE Performed at [facility], [address] [place],  [address]    Culture  Setup Time   Final    GRAM POSITIVE COCCI ANAEROBIC BOTTLE ONLY Gram Stain Report Called to,Read Back By and Verified With: HEATHER CRAWFORD,RN @0741  06/09/2020 KAY  GRAM POSITIVE COCCI AEROBIC BOTTLE ONLY Organism ID to follow CRITICAL RESULT CALLED TO, READ BACK BY AND VERIFIED WITH: Alycia Patten [id number] MLM Performed at [facility], [place] [address] [place], [place] [address]    Culture ENTEROCOCCUS FAECALIS (A)  Final   Report Status 06/11/2020 FINAL  Final   Organism ID, Bacteria ENTEROCOCCUS FAECALIS  Final      Susceptibility   Enterococcus faecalis - MIC*    AMPICILLIN <=2 SENSITIVE Sensitive     VANCOMYCIN 1 SENSITIVE Sensitive     GENTAMICIN SYNERGY SENSITIVE Sensitive     * ENTEROCOCCUS FAECALIS  Blood Culture ID Panel (Reflexed)     Status: Abnormal   Collection Time: 06/08/20  4:26 PM  Result Value Ref Range Status   Enterococcus faecalis DETECTED (A) NOT DETECTED Final    Comment: CRITICAL RESULT CALLED TO, READ BACK BY AND VERIFIED WITH: PHARMD K PIERCE [id number] MLM    Enterococcus Faecium NOT DETECTED NOT DETECTED Final   Listeria monocytogenes NOT DETECTED NOT DETECTED Final   Staphylococcus species NOT DETECTED NOT DETECTED Final   Staphylococcus aureus (BCID) NOT DETECTED NOT DETECTED Final   Staphylococcus epidermidis NOT DETECTED NOT DETECTED Final   Staphylococcus lugdunensis NOT DETECTED NOT DETECTED Final   Streptococcus species NOT DETECTED NOT DETECTED Final   Streptococcus agalactiae NOT DETECTED NOT DETECTED Final   Streptococcus pneumoniae NOT DETECTED NOT DETECTED Final   Streptococcus pyogenes NOT DETECTED NOT DETECTED Final   A.calcoaceticus-baumannii NOT DETECTED NOT DETECTED Final   Bacteroides fragilis NOT DETECTED NOT DETECTED Final   Enterobacterales NOT DETECTED NOT DETECTED Final   Enterobacter  cloacae complex NOT DETECTED NOT DETECTED Final   Escherichia coli NOT DETECTED NOT DETECTED Final   Klebsiella aerogenes NOT DETECTED NOT DETECTED Final   Klebsiella oxytoca NOT DETECTED NOT DETECTED Final   Klebsiella pneumoniae NOT DETECTED NOT DETECTED Final   Proteus species NOT DETECTED NOT DETECTED Final   Salmonella species NOT DETECTED NOT DETECTED Final   Serratia marcescens NOT DETECTED NOT DETECTED Final   Haemophilus influenzae NOT DETECTED NOT DETECTED Final   Neisseria meningitidis NOT DETECTED NOT DETECTED Final   Pseudomonas aeruginosa NOT DETECTED NOT DETECTED Final   Stenotrophomonas maltophilia NOT DETECTED NOT DETECTED Final   Candida albicans NOT DETECTED NOT DETECTED Final   Candida auris NOT DETECTED NOT DETECTED Final   Candida glabrata NOT DETECTED NOT DETECTED Final   Candida krusei NOT DETECTED NOT DETECTED Final   Candida parapsilosis NOT DETECTED NOT DETECTED Final   Candida tropicalis NOT DETECTED NOT DETECTED Final   Cryptococcus neoformans/gattii NOT DETECTED NOT DETECTED Final   Vancomycin resistance NOT DETECTED NOT DETECTED Final    Comment: Performed at [facility], [address], [place], [place] [address]  Resp Panel by RT-PCR (Flu A&B, Covid) Nasopharyngeal Swab     Status: None   Collection Time: 06/08/20  5:05 PM   Specimen: Nasopharyngeal Swab; Nasopharyngeal(NP) swabs in vial transport medium  Result Value Ref Range Status   SARS Coronavirus 2 by RT PCR NEGATIVE NEGATIVE Final    Comment: (NOTE) SARS-CoV-2 target nucleic acids are NOT DETECTED.  The SARS-CoV-2 RNA is generally detectable in upper respiratory specimens during the acute phase of infection. The lowest concentration of SARS-CoV-2 viral copies this assay can detect is 138 copies/mL. A negative result does not preclude SARS-Cov-2 infection and should not be used as the sole basis for treatment or other patient management decisions. A negative result  may occur with   improper specimen collection/handling, submission of specimen other than nasopharyngeal swab, presence of viral mutation(s) within the areas targeted by this assay, and inadequate number of viral copies(<138 copies/mL). A negative result must be combined with clinical observations, patient history, and epidemiological information. The expected result is Negative.  Fact Sheet for Patients:  EntrepreneurPulse.com.au  Fact Sheet for Healthcare Providers:  IncredibleEmployment.be  This test is no t yet approved or cleared by the [place] FDA and  has been authorized for detection and/or diagnosis of SARS-CoV-2 by FDA under an Emergency Use Authorization (EUA). This EUA will remain  in effect (meaning this test can be used) for the duration of the COVID-19 declaration under Section 564(b)(1) of the Act, 21 U.S.C.section 360bbb-3(b)(1), unless the authorization is terminated  or revoked sooner.       Influenza A by PCR NEGATIVE NEGATIVE Final   Influenza B by PCR NEGATIVE NEGATIVE Final    Comment: (NOTE) The Xpert Xpress SARS-CoV-2/FLU/RSV plus assay is intended as an aid in the diagnosis of influenza from Nasopharyngeal swab specimens and should not be used as a sole basis for treatment. Nasal washings and aspirates are unacceptable for Xpert Xpress SARS-CoV-2/FLU/RSV testing.  Fact Sheet for Patients: EntrepreneurPulse.com.au  Fact Sheet for Healthcare Providers: IncredibleEmployment.be  This test is not yet approved or cleared by the [place] FDA and has been authorized for detection and/or diagnosis of SARS-CoV-2 by FDA under an Emergency Use Authorization (EUA). This EUA will remain in effect (meaning this test can be used) for the duration of the COVID-19 declaration under Section 564(b)(1) of the Act, 21 U.S.C. section 360bbb-3(b)(1), unless the authorization is terminated  or revoked.  Performed at [facility], [address], [place], [place] [address]   Culture, body fluid-bottle     Status: None (Preliminary result)   Collection Time: 06/10/20  3:44 AM   Specimen: Peritoneal Dialysate  Result Value Ref Range Status   Specimen Description PERITONEAL DIALYSATE FLUID  Final   Special Requests   Final    BOTTLES DRAWN AEROBIC AND ANAEROBIC Blood Culture adequate volume   Culture   Final    NO GROWTH < 12 HOURS Performed at [facility] [address] [place], [place] [address]    Report Status PENDING  Incomplete  Gram stain     Status: None   Collection Time: 06/10/20  3:44 AM   Specimen: Peritoneal Dialysate  Result Value Ref Range Status   Specimen Description PERITONEAL DIALYSATE FLUID  Final   Special Requests NONE  Final   Gram Stain   Final    WBC PRESENT,BOTH PMN AND MONONUCLEAR NO ORGANISMS SEEN CYTOSPIN SMEAR Performed at [facility], [address], [place], [place] [address]    Report Status 06/10/2020 FINAL  Final  Culture, blood (routine x 2)     Status: None (Preliminary result)   Collection Time: 06/10/20  5:17 AM   Specimen: BLOOD RIGHT HAND  Result Value Ref Range Status   Specimen Description BLOOD RIGHT HAND  Final   Special Requests   Final    BOTTLES DRAWN AEROBIC ONLY Blood Culture adequate volume   Culture   Final    NO GROWTH < 12 HOURS Performed at [facility], [place] [address], [place], [place] [address]    Report Status PENDING  Incomplete  Culture, blood (routine x 2)     Status: None (Preliminary result)   Collection Time: 06/10/20  5:17  AM   Specimen: BLOOD RIGHT HAND  Result Value Ref Range Status   Specimen Description BLOOD RIGHT HAND  Final   Special Requests   Final    BOTTLES DRAWN AEROBIC ONLY Blood Culture results may not be optimal due to an inadequate volume of blood received in culture bottles   Culture   Final    NO GROWTH < 12 HOURS Performed at [facility],  [address], [place], [place] [address]    Report Status PENDING  Incomplete     Terri Piedra, [place]  06/11/2020  10:35 AM

## 2020-06-11 NOTE — Progress Notes (Signed)
    [facility] has been requested to perform a transesophageal echocardiogram on Dr. Nyoka Cowden for Bacteremia.  After careful review of history and examination, the risks and benefits of transesophageal echocardiogram have been explained including risks of esophageal damage, perforation (1:10,000 risk), bleeding, pharyngeal hematoma as well as other potential complications associated with conscious sedation including aspiration, arrhythmia, respiratory failure and death. Alternatives to treatment were discussed, questions were answered. Patient is willing to proceed.  TEE - Dr. Oval Linsey @ 1330  06/12/20. NPO after midnight. Meds with sips.   Leanor Kail, PA-C 06/11/2020 3:24 PM

## 2020-06-11 NOTE — Progress Notes (Signed)
PROGRESS NOTE  KUTTER SCHNEPF PVX:[id number] DOB: 23-Dec-1945 DOA: 06/08/2020 PCP: Celene Squibb, MD   LOS: 3 days   Brief narrative: As per HPI,  [name]  is a 74 y.o. male, with past medical history of COPD, CAD, hypertension, depression, history of aortic valve replacement, hypothyroidism, hyperlipidemia, obesity, MGUS, chronic kidney disease status post peritoneal dialysis catheter on  05/14/2020, and left aVF inserted by Dr. Dixie Dials 48/07/7076 at [facility] who had 5 session of training peritoneal dialysis last week presented to the hospital with fever chills and progressive dyspnea with worsening lower extremity edema with cough and wheezing.  In the ED patient was noted to be hypoxic saturating in the mid 80s on room air, tolerating 2 L nasal cannula. Chest x-ray was significant for diffuse multifocal opacities related to volume overload versus infectious process. Patient was febrile of 101 Fahrenheit, with leukocytosis of 22K, procalcitonin at 1.26, CRP at 16.7, D-dimers at 2.67,  hemoglobin of 8.8. Patient was started on broad-spectrum antibiotics and was admitted to the hospital for further evaluation and treatment.  Assessment/Plan:  Principal Problem:   Enterococcal bacteremia Active Problems:   CAD (coronary artery disease)   Depression   Essential (primary) hypertension   Chronic obstructive pulmonary disease, unspecified (HCC)   Hyperlipidemia, unspecified   Hypothyroidism, unspecified   Atherosclerotic heart disease of native coronary artery without angina pectoris   Morbid (severe) obesity due to excess calories (HCC)   Fever   Severe sepsis without septic shock (CODE) (HCC)   S/P AVR (aortic valve replacement)   Acute on chronic congestive heart failure (HCC)   Demand ischemia (HCC)  Acute hypoxic respiratory failure  multifactorial  secondary to volume overload,acute on chronic diastolic heart failure COPD and possible pneumonia.  VQ scan from  06/10/2020 showed bilateral scattered segmental to subsegmental perfusion defects worrisome for pulmonary embolism.  Spoke with the patient as well as patient's family at bedside regarding anticoagulation.  At this time we will start the patient on heparin drip.  Patient will benefit from Eliquis on discharge which has been discussed with the patient.  Risk of bleeding has been explained to the patient and the family at bedside.  Patient still has some dyspnea and shortness of breath, on  IV Lasix twice daily with some urine output.  Urology on board.  Empirically on IV antibiotic for possible pneumonia.  Closely monitor electrolytes.  Chest x-ray from yesterday with decreasing opacities.  Check duplex ultrasound of the lower extremities- pending.  2D echocardiogram done on 06/11/2020 showed LV ejection fraction of 60 to 65% with LVH and diastolic dysfunction.     Enterococcal bacteremia. With fever and leukocytosis on presentation..  Peritoneal fluid Gram stain without organisms, peritoneal fluid culture negative so far.  2D echocardiogram noted.  Infectious disease on board.  Currently on Rocephin and ampicillin.   History of aortic valve placement.  Follow nephrology recommendations.   COPD exacerbation Improving. on IV steroids, nebulizers.   We will change IV Solu-Medrol to IV daily from twice daily.  Could change to p.o. prednisone by tomorrow.   Possible multifocal pneumonia -Patient with fever elevated procalcitonin and multifocal opacities in the chest x-ray.  Currently on IV Rocephin and ampicillin.  ID on board.  2 L of oxygen by nasal cannula.    ESRD with evidence of significant volume overload, nongap metabolic acidosis. Peritoneal dialysis at home for the last 5 days prior to presentation.  Nephrology on board at this time.  Left AV fistula inserted by Dr. Dixie Dials 54/0/0867. Peritoneal hemodialysis inserted by general surgery at [facility] in [place] on 7/16.  Continue oral  sodium bicarb.  Continue fluid restriction.   Was on Lasix 40 p.o. twice daily at home.  Currently on IV 40 twice daily in the hospital.  Continue to hold allopurinol.  2 liters of oxygen by nasal cannula.  Further plans for peritoneal dialysis as per nephrology   anemia of chronic kidney disease, Received IV iron recently as outpatient..  Continue to monitor closely.     Essential hypertension Metoprolol to continue.  Hold Norvasc and hydralazine.  On IV diuretic at this time.  Blood pressure seems to be stable at this time.   CAD No acute disease.  Continue aspirin, statin and beta-blockers   Hypothyroidism -Continue with Synthroid   BPH -Continue with Proscar   Depression On sertraline.  Continue   Hyperlipidemia On simvastatin.  Continue  Hypokalemia.  Improved after replacement.  Check BMP in a.m.  DVT prophylaxis: Start heparin drip  Code Status: Full code  Family Communication: Spoke with the patient's wife at bedside.  Status is: Inpatient  Remains inpatient appropriate because:IV treatments appropriate due to intensity of illness or inability to take PO and Inpatient level of care appropriate due to severity of illness, enterococcal bacteremia, on supplemental oxygen, pulmonary embolism on heparin drip   Dispo: The patient is from: Home              Anticipated d/c is to: Home              Anticipated d/c date is: 2 days more.  Follow ID and nephrology recommendation.  Start heparin drip.  Check PT and OT evaluation pending -new PE.              Patient currently is not medically stable to d/c.   Consultants:  Nephrology  Infectious disease  Procedures:  None yet  Antibiotics:  . Rocephin and ampicillin IV 12/12>  Anti-infectives (From admission, onward)   Start     Dose/Rate Route Frequency Ordered Stop   06/09/20 2200  cefTRIAXone (ROCEPHIN) 2 g in sodium chloride 0.9 % 100 mL IVPB        2 g 200 mL/hr over 30 Minutes Intravenous Every 12 hours  06/09/20 1708     06/09/20 1800  ceFEPIme (MAXIPIME) 1 g in sodium chloride 0.9 % 100 mL IVPB  Status:  Discontinued        1 g 200 mL/hr over 30 Minutes Intravenous Every 24 hours 06/09/20 0835 06/09/20 1455   06/09/20 1600  ampicillin (OMNIPEN) 2 g in sodium chloride 0.9 % 100 mL IVPB        2 g 300 mL/hr over 20 Minutes Intravenous Every 12 hours 06/09/20 1455     06/09/20 0835  vancomycin variable dose per unstable renal function (pharmacist dosing)  Status:  Discontinued         Does not apply See admin instructions 06/09/20 0835 06/09/20 1455   06/08/20 1800  vancomycin (VANCOREADY) IVPB 2000 mg/400 mL        2,000 mg 200 mL/hr over 120 Minutes Intravenous  Once 06/08/20 1700 06/08/20 2007   06/08/20 1715  ceFEPIme (MAXIPIME) 2 g in sodium chloride 0.9 % 100 mL IVPB        2 g 200 mL/hr over 30 Minutes Intravenous  Once 06/08/20 1700 06/08/20 1805       Subjective: Today, patient was  seen and examined at bedside.  Denies any chest pain, fever, chills or rigor but has some dyspnea and shortness of breath.    Objective: Vitals:   06/11/20 0244 06/11/20 0434  BP:  123/67  Pulse:  79  Resp:  18  Temp:  98.1 F (36.7 C)  SpO2: 94% 95%    Intake/Output Summary (Last 24 hours) at 06/11/2020 0759 Last data filed at 06/11/2020 0457 Gross per 24 hour  Intake 4003.29 ml  Output 1300 ml  Net 2703.29 ml   Filed Weights   06/09/20 0147 06/09/20 2111 06/10/20 2021  Weight: 120.1 kg 120.1 kg 125.6 kg   Body mass index is 40.89 kg/m.   Physical Exam: GENERAL: Patient is alert awake and oriented. Not in obvious distress.,  Morbidly obese, on nasal cannula oxygen 2 L/min. HENT: No scleral pallor or icterus. Pupils equally reactive to light. Oral mucosa is moist NECK: is supple, no gross swelling noted. CHEST:.  Respirations noted bilaterally course. CVS: S1 and S2 heard, no murmur. Regular rate and rhythm.  ABDOMEN: Soft, non-tender, bowel sounds are present.  Peritoneal  dialysis catheter in place with dressing., mildly distended EXTREMITIES: Bilateral lower extremity edema noted. CNS: Cranial nerves are intact. No focal motor deficits. SKIN: warm and dry without rashes.  Data Review: I have personally reviewed the following laboratory data and studies,  CBC: Recent Labs  Lab 06/08/20 1332 06/09/20 0415 06/10/20 0517 06/11/20 0125  WBC 22.3* 18.5* 18.5* 19.1*  NEUTROABS 20.2*  --  17.4*  --   HGB 8.8* 8.0* 7.8* 7.8*  HCT 27.1* 25.4* 23.9* 24.8*  MCV 98.2 97.7 96.4 98.0  PLT 224 192 213 503   Basic Metabolic Panel: Recent Labs  Lab 06/08/20 1621 06/09/20 0415 06/09/20 1012 06/10/20 0517 06/11/20 0125  NA 135 139 138 139 139  K 3.9 3.7 3.5 3.4* 4.1  CL 101 104 104 104 104  CO2 21* 18* 17* 17* 19*  GLUCOSE 146* 188* 190* 189* 182*  BUN 71* 75* 79* 90* 105*  CREATININE 5.53* 5.72* 6.06* 6.52* 6.85*  CALCIUM 8.6* 8.8* 8.6* 8.7* 8.7*  MG  --   --   --   --  2.2  PHOS  --   --   --   --  6.1*   Liver Function Tests: Recent Labs  Lab 06/08/20 1621 06/09/20 1012 06/11/20 0125  AST 24 27 36  ALT 14 13 20   ALKPHOS 56 49 48  BILITOT 0.6 0.6 0.5  PROT 6.9 6.1* 6.1*  ALBUMIN 3.1* 2.4* 2.5*   No results for input(s): LIPASE, AMYLASE in the last 168 hours. No results for input(s): AMMONIA in the last 168 hours. Cardiac Enzymes: No results for input(s): CKTOTAL, CKMB, CKMBINDEX, TROPONINI in the last 168 hours. BNP (last 3 results) Recent Labs    06/08/20 1647  BNP 2,673.0*    ProBNP (last 3 results) No results for input(s): PROBNP in the last 8760 hours.  CBG: No results for input(s): GLUCAP in the last 168 hours. Recent Results (from the past 240 hour(s))  Blood Culture (routine x 2)     Status: Abnormal (Preliminary result)   Collection Time: 06/08/20  4:21 PM   Specimen: BLOOD  Result Value Ref Range Status   Specimen Description   Final    BLOOD Performed at [facility], [address], [place], [place] [address]     Special Requests   Final    NONE Performed at [facility], [address],  [place], [place] [address]    Culture  Setup Time   Final    GRAM POSITIVE COCCI Gram Stain Report Called to,Read Back By and Verified With: HEATHER CRAWFORD,RN @0741  06/09/2020 KAY IN BOTH AEROBIC AND ANAEROBIC BOTTLES Performed at [facility], [place] [address], [place], [place] [address]    Culture ENTEROCOCCUS FAECALIS (A)  Final   Report Status PENDING  Incomplete  Blood Culture (routine x 2)     Status: Abnormal (Preliminary result)   Collection Time: 06/08/20  4:26 PM   Specimen: BLOOD  Result Value Ref Range Status   Specimen Description   Final    BLOOD Performed at [facility], [address] [place], [place] [address]    Special Requests   Final    NONE Performed at [facility], [address], [place], [place] [address]    Culture  Setup Time   Final    GRAM POSITIVE COCCI ANAEROBIC BOTTLE ONLY Gram Stain Report Called to,Read Back By and Verified With: HEATHER CRAWFORD,RN @0741  06/09/2020 KAY GRAM POSITIVE COCCI AEROBIC BOTTLE ONLY Organism ID to follow CRITICAL RESULT CALLED TO, READ BACK BY AND VERIFIED WITH: Alycia Patten [id number] MLM Performed at [facility], [place] [address], [place], [place] [address]    Culture ENTEROCOCCUS FAECALIS (A)  Final   Report Status PENDING  Incomplete  Blood Culture ID Panel (Reflexed)     Status: Abnormal   Collection Time: 06/08/20  4:26 PM  Result Value Ref Range Status   Enterococcus faecalis DETECTED (A) NOT DETECTED Final    Comment: CRITICAL RESULT CALLED TO, READ BACK BY AND VERIFIED WITH: PHARMD K PIERCE [id number] MLM    Enterococcus Faecium NOT DETECTED NOT DETECTED Final   Listeria monocytogenes NOT DETECTED NOT DETECTED Final   Staphylococcus species NOT DETECTED NOT DETECTED Final   Staphylococcus aureus (BCID) NOT DETECTED NOT DETECTED Final   Staphylococcus epidermidis NOT DETECTED NOT DETECTED Final   Staphylococcus  lugdunensis NOT DETECTED NOT DETECTED Final   Streptococcus species NOT DETECTED NOT DETECTED Final   Streptococcus agalactiae NOT DETECTED NOT DETECTED Final   Streptococcus pneumoniae NOT DETECTED NOT DETECTED Final   Streptococcus pyogenes NOT DETECTED NOT DETECTED Final   A.calcoaceticus-baumannii NOT DETECTED NOT DETECTED Final   Bacteroides fragilis NOT DETECTED NOT DETECTED Final   Enterobacterales NOT DETECTED NOT DETECTED Final   Enterobacter cloacae complex NOT DETECTED NOT DETECTED Final   Escherichia coli NOT DETECTED NOT DETECTED Final   Klebsiella aerogenes NOT DETECTED NOT DETECTED Final   Klebsiella oxytoca NOT DETECTED NOT DETECTED Final   Klebsiella pneumoniae NOT DETECTED NOT DETECTED Final   Proteus species NOT DETECTED NOT DETECTED Final   Salmonella species NOT DETECTED NOT DETECTED Final   Serratia marcescens NOT DETECTED NOT DETECTED Final   Haemophilus influenzae NOT DETECTED NOT DETECTED Final   Neisseria meningitidis NOT DETECTED NOT DETECTED Final   Pseudomonas aeruginosa NOT DETECTED NOT DETECTED Final   Stenotrophomonas maltophilia NOT DETECTED NOT DETECTED Final   Candida albicans NOT DETECTED NOT DETECTED Final   Candida auris NOT DETECTED NOT DETECTED Final   Candida glabrata NOT DETECTED NOT DETECTED Final   Candida krusei NOT DETECTED NOT DETECTED Final   Candida parapsilosis NOT DETECTED NOT DETECTED Final   Candida tropicalis NOT DETECTED NOT DETECTED Final   Cryptococcus neoformans/gattii NOT DETECTED NOT DETECTED Final   Vancomycin resistance NOT DETECTED NOT DETECTED Final    Comment: Performed at [facility], [address] [place], [place] [address]  Resp Panel by RT-PCR (Flu A&B, Covid) Nasopharyngeal Swab     Status: None   Collection Time: 06/08/20  5:05 PM   Specimen: Nasopharyngeal Swab; Nasopharyngeal(NP) swabs in vial transport medium  Result Value Ref Range Status   SARS Coronavirus 2 by RT PCR NEGATIVE NEGATIVE Final     Comment: (NOTE) SARS-CoV-2 target nucleic acids are NOT DETECTED.  The SARS-CoV-2 RNA is generally detectable in upper respiratory specimens during the acute phase of infection. The lowest concentration of SARS-CoV-2 viral copies this assay can detect is 138 copies/mL. A negative result does not preclude SARS-Cov-2 infection and should not be used as the sole basis for treatment or other patient management decisions. A negative result may occur with  improper specimen collection/handling, submission of specimen other than nasopharyngeal swab, presence of viral mutation(s) within the areas targeted by this assay, and inadequate number of viral copies(<138 copies/mL). A negative result must be combined with clinical observations, patient history, and epidemiological information. The expected result is Negative.  Fact Sheet for Patients:  EntrepreneurPulse.com.au  Fact Sheet for Healthcare Providers:  IncredibleEmployment.be  This test is no t yet approved or cleared by the [place] FDA and  has been authorized for detection and/or diagnosis of SARS-CoV-2 by FDA under an Emergency Use Authorization (EUA). This EUA will remain  in effect (meaning this test can be used) for the duration of the COVID-19 declaration under Section 564(b)(1) of the Act, 21 U.S.C.section 360bbb-3(b)(1), unless the authorization is terminated  or revoked sooner.       Influenza A by PCR NEGATIVE NEGATIVE Final   Influenza B by PCR NEGATIVE NEGATIVE Final    Comment: (NOTE) The Xpert Xpress SARS-CoV-2/FLU/RSV plus assay is intended as an aid in the diagnosis of influenza from Nasopharyngeal swab specimens and should not be used as a sole basis for treatment. Nasal washings and aspirates are unacceptable for Xpert Xpress SARS-CoV-2/FLU/RSV testing.  Fact Sheet for Patients: EntrepreneurPulse.com.au  Fact Sheet for Healthcare  Providers: IncredibleEmployment.be  This test is not yet approved or cleared by the [place] FDA and has been authorized for detection and/or diagnosis of SARS-CoV-2 by FDA under an Emergency Use Authorization (EUA). This EUA will remain in effect (meaning this test can be used) for the duration of the COVID-19 declaration under Section 564(b)(1) of the Act, 21 U.S.C. section 360bbb-3(b)(1), unless the authorization is terminated or revoked.  Performed at [facility], [address] [place], [place] [address]   Culture, body fluid-bottle     Status: None (Preliminary result)   Collection Time: 06/10/20  3:44 AM   Specimen: Peritoneal Dialysate  Result Value Ref Range Status   Specimen Description PERITONEAL DIALYSATE FLUID  Final   Special Requests   Final    BOTTLES DRAWN AEROBIC AND ANAEROBIC Blood Culture adequate volume   Culture   Final    NO GROWTH < 12 HOURS Performed at [place] [address] [place], [place] [address]    Report Status PENDING  Incomplete  Gram stain     Status: None   Collection Time: 06/10/20  3:44 AM   Specimen: Peritoneal Dialysate  Result Value Ref Range Status   Specimen Description PERITONEAL DIALYSATE FLUID  Final   Special Requests NONE  Final   Gram Stain   Final    WBC PRESENT,BOTH PMN AND MONONUCLEAR NO ORGANISMS SEEN CYTOSPIN SMEAR Performed at [facility], [address] [place],  [place] [address]    Report Status 06/10/2020 FINAL  Final  Culture, blood (routine x 2)     Status: None (Preliminary result)   Collection Time: 06/10/20  5:17 AM   Specimen: BLOOD RIGHT HAND  Result Value Ref Range Status   Specimen Description BLOOD RIGHT HAND  Final   Special Requests   Final    BOTTLES DRAWN AEROBIC ONLY Blood Culture adequate volume   Culture   Final    NO GROWTH < 12 HOURS Performed at [facility], [place] [address], [place], [place] [address]    Report Status PENDING  Incomplete   Culture, blood (routine x 2)     Status: None (Preliminary result)   Collection Time: 06/10/20  5:17 AM   Specimen: BLOOD RIGHT HAND  Result Value Ref Range Status   Specimen Description BLOOD RIGHT HAND  Final   Special Requests   Final    BOTTLES DRAWN AEROBIC ONLY Blood Culture results may not be optimal due to an inadequate volume of blood received in culture bottles   Culture   Final    NO GROWTH < 12 HOURS Performed at [facility], [place] [address], [place], [place] [address]    Report Status PENDING  Incomplete     Studies: DG Chest 1 View  Result Date: 06/10/2020 CLINICAL DATA:  74 year old male with history of pneumonia, shortness of breath, fever. EXAM: CHEST  1 VIEW COMPARISON:  06/08/2020 FINDINGS: The radiograph is under exposed. Unchanged cardiomegaly. Atherosclerotic calcification of the aortic arch. Global decrease in previously visualized patchy diffuse pulmonary opacities. No significant pleural effusions. No pneumothorax. No new consolidative opacities. Median sternotomy wires remain intact. No acute osseous abnormality. IMPRESSION: Interval decrease in previously visualized diffuse patchy pulmonary opacities suggestive of improving pulmonary edema versus improving multifocal pneumonia. Unchanged cardiomegaly. Electronically Signed   By: Ruthann Cancer MD   On: 06/10/2020 08:16   NM Pulmonary Perfusion  Result Date: 06/10/2020 CLINICAL DATA:  Elevated D-dimer. Hypoxia. Question pulmonary emboli. EXAM: NUCLEAR MEDICINE PERFUSION LUNG SCAN TECHNIQUE: Perfusion images were obtained in multiple projections after intravenous injection of radiopharmaceutical. Ventilation scans intentionally deferred if perfusion scan and chest x-ray adequate for interpretation during COVID 19 epidemic. RADIOPHARMACEUTICALS:  4.3 mCi Tc-14m MAA IV COMPARISON:  Chest radiography same day FINDINGS: Bilateral scattered segmental to subsegmental perfusion defects, most notable in the mid and  lower right lung, worrisome for pulmonary emboli. There is no chest radiographic abnormality to explain this. Ventilation study no longer performed. IMPRESSION: Bilateral scattered segmental to subsegmental perfusion defects right more than left worrisome for pulmonary emboli. Electronically Signed   By: Nelson Chimes M.D.   On: 06/10/2020 13:59   ECHOCARDIOGRAM COMPLETE  Result Date: 06/10/2020    ECHOCARDIOGRAM REPORT   Patient Name:   [name] Date of Exam: 06/10/2020 Medical Rec #:  [id number]     Height:       69.0 in Accession #:    [id number]    Weight:       264.8 lb Date of Birth:  12-Sep-1945     BSA:          2.328 m Patient Age:    74 years      BP:           113/61 mmHg Patient Gender: M             HR:           88 bpm. Exam Location:  Inpatient Procedure: 2D Echo, 3D Echo, Strain Analysis, Color Doppler and Cardiac Doppler Indications:     Bacteremia 790.7 / R78.81  History:         Patient has prior history of Echocardiogram examinations, most                  recent 07/07/2017. CAD, Prior CABG, COPD,                  Signs/Symptoms:Shortness of Breath; Risk Factors:Former Smoker,                  Hypertension and Dyslipidemia. Acute exacerbation of congestive                  heart failure, Enterococcus bacteremia, End stage renal                  disease, thyroid disease.                  Aortic Valve: 25 mm Edwards MAGNA EASE valve is present in the                  aortic position. Procedure Date: 07/11/2012.  Sonographer:     Darlina Sicilian RDCS Referring Phys:  [id number] [place] T VU Diagnosing Phys: Loralie Champagne MD IMPRESSIONS  1. Left ventricular ejection fraction, by estimation, is 60 to 65%. The left ventricle has normal function. The left ventricle has no regional wall motion abnormalities. There is moderate left ventricular hypertrophy. Left ventricular diastolic parameters are consistent with Grade II diastolic dysfunction (pseudonormalization).  2. Right ventricular systolic function  is mildly reduced. The right ventricular size is mildly enlarged. There is severely elevated pulmonary artery systolic pressure. The estimated right ventricular systolic pressure is 70.6 mmHg.  3. Left atrial size was mild to moderately dilated.  4. Right atrial size was mildly dilated.  5. The mitral valve is degenerative. Mild to moderate mitral valve regurgitation. Mild to moderate mitral stenosis, mean gradient 11 mmHg but MVA 2.3 cm^2 by PHT. Suspect elevated gradient related in part to high flow. Moderate to severe mitral annular calcification.  6. Bioprosthetic aortic valve. Mean gradient 28 mmHg, moderately elevated. No significant regurgitation.  7. Tricuspid valve regurgitation is moderate.  8. The inferior vena cava is dilated in size with <50% respiratory variability, suggesting right atrial pressure of 15 mmHg.  9. Neither the bioprosthetic aortic valve nor the heavily calcified mitral valve/annulus were visualized well enough to rule out endocarditis. Suggest TEE. FINDINGS  Left Ventricle: Left ventricular ejection fraction, by estimation, is 60 to 65%. The left ventricle has normal function. The left ventricle has no regional wall motion abnormalities. The left ventricular internal cavity size was normal in size. There is  moderate left ventricular hypertrophy. Left ventricular diastolic parameters are consistent with Grade II diastolic dysfunction (pseudonormalization). Right Ventricle: The right ventricular size is mildly enlarged. No increase in right ventricular wall thickness. Right ventricular systolic function is mildly reduced. There is severely elevated pulmonary artery systolic pressure. The tricuspid regurgitant velocity is 3.47 m/s, and with an assumed right atrial pressure of 15 mmHg, the estimated right ventricular systolic pressure is 23.7 mmHg. Left Atrium: Left atrial size was mild to moderately dilated. Right Atrium: Right atrial size was mildly dilated. Pericardium: There is no  evidence of pericardial effusion. Mitral Valve: The mitral valve is degenerative in appearance. There is mild calcification of the mitral valve leaflet(s). Moderate to severe mitral annular calcification. Mild to  moderate mitral valve regurgitation. Mild to moderate mitral valve stenosis. MV peak gradient, 21.3 mmHg. The mean mitral valve gradient is 11.0 mmHg. Tricuspid Valve: The tricuspid valve is normal in structure. Tricuspid valve regurgitation is moderate. Aortic Valve: Bioprosthetic aortic valve. Mean gradient 28 mmHg, moderately elevated. No significant regurgitation. The aortic valve has been repaired/replaced. Aortic valve regurgitation is not visualized. Aortic valve mean gradient measures 28.0 mmHg. Aortic valve peak gradient measures 43.8 mmHg. Aortic valve area, by VTI measures 2.02 cm. There is a 25 mm Edwards MAGNA EASE valve present in the aortic position. Procedure Date: 07/11/2012. Pulmonic Valve: The pulmonic valve was normal in structure. Pulmonic valve regurgitation is trivial. Aorta: The aortic root is normal in size and structure. Venous: The inferior vena cava is dilated in size with less than 50% respiratory variability, suggesting right atrial pressure of 15 mmHg. IAS/Shunts: No atrial level shunt detected by color flow Doppler.  LEFT VENTRICLE PLAX 2D LVIDd:         5.00 cm  Diastology LVIDs:         3.10 cm  LV e' medial:    5.55 cm/s LV PW:         1.40 cm  LV E/e' medial:  36.6 LV IVS:        1.80 cm  LV e' lateral:   7.29 cm/s LVOT diam:     2.70 cm  LV E/e' lateral: 27.8 LV SV:         134 LV SV Index:   58 LVOT Area:     5.73 cm                          3D Volume EF:                         3D EF:        63 %                         LV EDV:       322 ml                         LV ESV:       118 ml                         LV SV:        204 ml RIGHT VENTRICLE RV S prime:     10.00 cm/s TAPSE (M-mode): 1.6 cm LEFT ATRIUM             Index       RIGHT ATRIUM           Index LA diam:         5.30 cm 2.28 cm/m  RA Area:     21.60 cm LA Vol (A2C):   93.3 ml 40.08 ml/m RA Volume:   59.40 ml  25.52 ml/m LA Vol (A4C):   83.6 ml 35.92 ml/m LA Biplane Vol: 92.5 ml 39.74 ml/m  AORTIC VALVE AV Area (Vmax):    1.87 cm AV Area (Vmean):   1.87 cm AV Area (VTI):     2.02 cm AV Vmax:           331.00 cm/s AV Vmean:          231.333 cm/s AV VTI:  0.665 m AV Peak Grad:      43.8 mmHg AV Mean Grad:      28.0 mmHg LVOT Vmax:         108.00 cm/s LVOT Vmean:        75.400 cm/s LVOT VTI:          0.234 m LVOT/AV VTI ratio: 0.35  AORTA Ao Asc diam: 3.50 cm MITRAL VALVE                TRICUSPID VALVE MV Area (PHT): 5.02 cm     TR Peak grad:   48.2 mmHg MV Peak grad:  21.3 mmHg    TR Vmax:        347.00 cm/s MV Mean grad:  11.0 mmHg MV Vmax:       2.31 m/s     SHUNTS MV Vmean:      149.5 cm/s   Systemic VTI:  0.23 m MV Decel Time: 151 msec     Systemic Diam: 2.70 cm MR Peak grad: 96.4 mmHg MR Mean grad: 68.0 mmHg MR Vmax:      491.00 cm/s MR Vmean:     401.0 cm/s MV E velocity: 203.00 cm/s MV A velocity: 187.00 cm/s MV E/A ratio:  1.09 Loralie Champagne MD Electronically signed by Loralie Champagne MD Signature Date/Time: 06/10/2020/2:33:55 PM    Final (Updated)       Flora Lipps, MD  [facility] 06/11/2020

## 2020-06-11 NOTE — Evaluation (Signed)
Occupational Therapy Evaluation Patient Details Name: [name] MRN: [id number] DOB: [birth year] Today's Date: 06/10/2020    History of Present Illness 74 y.o. male, with past medical history of COPD, CAD, hypertension, depression, hypothyroidism, hyperlipidemia, obesity, MGUS, patient with progressive renal disease, is being planned to initiate dialysis, he is is been followed by [facility] nephrology, patient had peritoneal dialysis catheter inserted on 05/14/2020, and left aVF inserted by Dr. Dixie Dials 31/10/1759, patient had 5 session of training peritoneal dialysis last week, per wife report he did receive IV iron for anemia, she reports he did have 1600 cc negative balance during session yesterday, patient presented to ED secondary to complaints of fever and chills, he is vaccinated against Covid, reports progressive dyspnea over the last couple days, worsening lower extremity edema over last 2 weeks, as well he does report some wheezing, dry heaving, he denies any chest pain.   Clinical Impression   ** Delayed entry- pt seen 12/13. Pt PTA: pt living with spouse who works full time. Pt is retired. Pt mostly independent with ADL and mobility. Pt currently, set-upA to [place]. Pt limited due to decreased strength, decreased ability to care for self and decreased mobility. Pt performing toilet hygiene with assist from spouse. Pt minguardA +2 for hand held assist for mobility in room with IV pole. Pt fatigues easily, but remains >90% on 2-3L continuous O2. Pt would benefit from continued OT skilled services for ADL, mobility and energy conservation techniques. OT following acutely.    Follow Up Recommendations  Home health OT    Equipment Recommendations  None recommended by OT    Recommendations for Other Services       Precautions / Restrictions Precautions Precautions: Fall Precaution Comments: Pt reports h/o falls. Restrictions Weight Bearing Restrictions: No      Mobility Bed  Mobility Overal bed mobility: Needs Assistance Bed Mobility: Supine to Sit     Supine to sit: Supervision          Transfers Overall transfer level: Needs assistance Equipment used: 2 person hand held assist Transfers: Sit to/from Stand;Stand Pivot Transfers Sit to Stand: Min guard Stand pivot transfers: Min guard       General transfer comment: assist from OTR and spouse    Balance Overall balance assessment: Needs assistance Sitting-balance support: No upper extremity supported;Feet supported Sitting balance-Leahy Scale: Good     Standing balance support: Single extremity supported;No upper extremity supported;During functional activity Standing balance-Leahy Scale: Fair                             ADL either performed or assessed with clinical judgement   ADL Overall ADL's : Needs assistance/impaired Eating/Feeding: Modified independent;Sitting   Grooming: Set up;Sitting;Min guard;Standing   Upper Body Bathing: Minimal assistance;Sitting   Lower Body Bathing: Moderate assistance;Sitting/lateral leans;Sit to/from stand   Upper Body Dressing : Minimal assistance;Sitting   Lower Body Dressing: Moderate assistance;Sitting/lateral leans;Sit to/from stand   Toilet Transfer: Supervision/safety;Ambulation;Cueing for safety;RW   Toileting- Clothing Manipulation and Hygiene: Minimal assistance;Sitting/lateral lean;Sit to/from stand       Functional mobility during ADLs: Min guard;+2 for physical assistance;Cueing for safety (prefers +2 hand heldA, but safer with RW) General ADL Comments: Pt limited due to decreased strength, decreased ability to care for self and decreased mobility. Pt performing toilet hygiene with assist from spouse.     Vision Baseline Vision/History: No visual deficits Vision Assessment?: No apparent visual deficits  Perception     Praxis      Pertinent Vitals/Pain Pain Assessment: Faces Faces Pain Scale: Hurts a little  bit Pain Location: chest for deep breaths Pain Descriptors / Indicators: Discomfort Pain Intervention(s): Monitored during session     Hand Dominance Right   Extremity/Trunk Assessment Upper Extremity Assessment Upper Extremity Assessment: Generalized weakness   Lower Extremity Assessment Lower Extremity Assessment: Generalized weakness   Cervical / Trunk Assessment Cervical / Trunk Assessment: Normal   Communication Communication Communication: No difficulties   Cognition Arousal/Alertness: Awake/alert Behavior During Therapy: Anxious Overall Cognitive Status: Within Functional Limits for tasks assessed                                 General Comments: intention tremor noted. Pt reports its worse now due to anxiety.   General Comments  VSS on 2L O2.    Exercises     Shoulder Instructions      Home Living Family/patient expects to be discharged to:: Private residence Living Arrangements: Spouse/significant other Available Help at Discharge: Family;Available 24 hours/day Type of Home: House Home Access: Stairs to enter [employer] of Steps: 2 Entrance Stairs-Rails: Right;Left Home Layout: One level     Bathroom Shower/Tub: Teacher, early years/pre: Standard     Home Equipment: Cane - single point          Prior Functioning/Environment Level of Independence: Independent        Comments: Pt is retired; spouse works full time        OT Problem List: Decreased activity tolerance;Impaired balance (sitting and/or standing);Increased edema;Decreased safety awareness      OT Treatment/Interventions: Self-care/ADL training;Therapeutic exercise;DME and/or AE instruction;Therapeutic activities;Patient/family education;Balance training;Energy conservation    OT Goals(Current goals can be found in the care plan section) Acute Rehab OT Goals Patient Stated Goal: home OT Goal Formulation: With patient Time For Goal  Achievement: 06/25/20 Potential to Achieve Goals: Good ADL Goals Pt Will Perform Grooming: with modified independence;standing Pt Will Perform Lower Body Dressing: with min guard assist;sit to/from stand Pt/caregiver will Perform Home Exercise Program: Increased strength;Both right and left upper extremity Additional ADL Goal #1: Pt will utilize/state 3 energy conservation techniques to increase independence for ADL and mobility  OT Frequency: Min 2X/week   Barriers to D/C:            Co-evaluation              AM-PAC OT "6 Clicks" Daily Activity     Outcome Measure Help from another person eating meals?: None Help from another person taking care of personal grooming?: None Help from another person toileting, which includes using toliet, bedpan, or urinal?: A Little Help from another person bathing (including washing, rinsing, drying)?: A Lot Help from another person to put on and taking off regular upper body clothing?: None Help from another person to put on and taking off regular lower body clothing?: A Lot 6 Click Score: 19   End of Session Equipment Utilized During Treatment: Gait belt;Oxygen Nurse Communication: Mobility status  Activity Tolerance: Patient tolerated treatment well Patient left: in chair;with call bell/phone within reach;with chair alarm set;with family/visitor present  OT Visit Diagnosis: Unsteadiness on feet (R26.81);Muscle weakness (generalized) (M62.81)                Time: 6073-7106 OT Time Calculation (min): 32 min Charges:  OT General Charges $OT Visit: 1 Visit OT Evaluation $  OT Eval Moderate Complexity: 1 Mod OT Treatments $Self Care/Home Management : 8-22 mins  Jefferey Pica, OTR/L Acute Rehabilitation Services Pager: [phone] Office: [phone]    Tasman Zapata C 06/11/2020, 8:19 AM

## 2020-06-11 NOTE — Progress Notes (Signed)
Patient ID: [name], male   DOB: 21-Apr-1946, 74 y.o.   MRN: [id number] [facility] Progress Note   Assessment/ Plan:   1. 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  He has a recently placed left radiocephalic fistula with well-healed surgical incision site.  He does not have any acute indications for dialysis at this time; has hypervolemia without any significant respiratory compromise and does not have any uremic signs or symptoms. 2.  Acute exacerbation of congestive heart failure: Possibly with concomitant pneumonia versus COPD exacerbation.  Will transiently increase furosemide to 80 mg IV twice daily to help with volume unloading 3.  Enterococcus bacteremia: On antibiotic coverage with ceftriaxone and ampicillin.  Transthoracic echocardiogram did not show any valvular vegetation and plans noted for possible TEE. 4.  Nongap metabolic acidosis: Secondary to progressive chronic kidney disease, on oral sodium bicarbonate. 5.  Anemia: Likely secondary to chronic kidney disease, iron stores are replete.  Will treat with Aranesp.  Subjective:   Reports to be feeling fair, denies any abnormal limb jerking movements, worsening shortness of breath/chest pain or nausea/vomiting or dysgeusia.   Objective:   BP 123/67 (BP Location: Right Arm)   Pulse 79   Temp 98.1 F (36.7 C) (Oral)   Resp 18   Ht 5\' 9"  (1.753 m)   Wt 125.6 kg   SpO2 95%   BMI 40.89 kg/m   Intake/Output Summary (Last 24 hours) at 06/11/2020 1125 Last data filed at 06/11/2020 0457 Gross per 24 hour  Intake 3763.29 ml  Output 1150 ml  Net 2613.29 ml   Weight change: 5.5 kg  Physical Exam: Gen: Appears comfortable resting in recliner, wife at bedside CVS: Pulse regular rhythm, normal rate, S1 and S2 normal Resp: Poor  inspiratory effort with decreased breath sounds over bases Abd: Soft, obese, nontender.  PD catheter in situ Ext: 2+ pitting lower extremity edema.  Left RCF with palpable thrill.  Imaging: DG Chest 1 View  Result Date: 06/10/2020 CLINICAL DATA:  74 year old male with history of pneumonia, shortness of breath, fever. EXAM: CHEST  1 VIEW COMPARISON:  06/08/2020 FINDINGS: The radiograph is under exposed. Unchanged cardiomegaly. Atherosclerotic calcification of the aortic arch. Global decrease in previously visualized patchy diffuse pulmonary opacities. No significant pleural effusions. No pneumothorax. No new consolidative opacities. Median sternotomy wires remain intact. No acute osseous abnormality. IMPRESSION: Interval decrease in previously visualized diffuse patchy pulmonary opacities suggestive of improving pulmonary edema versus improving multifocal pneumonia. Unchanged cardiomegaly. Electronically Signed   By: Ruthann Cancer MD   On: 06/10/2020 08:16   NM Pulmonary Perfusion  Result Date: 06/10/2020 CLINICAL DATA:  Elevated D-dimer. Hypoxia. Question pulmonary emboli. EXAM: NUCLEAR MEDICINE PERFUSION LUNG SCAN TECHNIQUE: Perfusion images were obtained in multiple projections after intravenous injection of radiopharmaceutical. Ventilation scans intentionally deferred if perfusion scan and chest x-ray adequate for interpretation during COVID 19 epidemic. RADIOPHARMACEUTICALS:  4.3 mCi Tc-80m MAA IV COMPARISON:  Chest radiography same day FINDINGS: Bilateral scattered segmental to subsegmental perfusion defects, most notable in the mid and lower right lung, worrisome for pulmonary emboli. There is no chest radiographic abnormality to explain this. Ventilation study no longer performed. IMPRESSION: Bilateral scattered segmental to subsegmental perfusion defects right more than left worrisome for pulmonary emboli. Electronically Signed   By: Nelson Chimes M.D.   On: 06/10/2020 13:59   ECHOCARDIOGRAM  COMPLETE  Result Date: 06/10/2020    ECHOCARDIOGRAM REPORT   Patient Name:   [name] Date of Exam: 06/10/2020 Medical Rec #:  [id number]     Height:       69.0 in Accession #:    [id number]    Weight:       264.8 lb Date of Birth:  October 17, 1945     BSA:          2.328 m Patient Age:    84 years      BP:           113/61 mmHg Patient Gender: M             HR:           88 bpm. Exam Location:  Inpatient Procedure: 2D Echo, 3D Echo, Strain Analysis, Color Doppler and Cardiac Doppler Indications:     Bacteremia 790.7 / R78.81  History:         Patient has prior history of Echocardiogram examinations, most                  recent 07/07/2017. CAD, Prior CABG, COPD,                  Signs/Symptoms:Shortness of Breath; Risk Factors:Former Smoker,                  Hypertension and Dyslipidemia. Acute exacerbation of congestive                  heart failure, Enterococcus bacteremia, End stage renal                  disease, thyroid disease.                  Aortic Valve: 25 mm Edwards MAGNA EASE valve is present in the                  aortic position. Procedure Date: 07/11/2012.  Sonographer:     Darlina Sicilian RDCS Referring Phys:  [id number] [place] T VU Diagnosing Phys: Loralie Champagne MD IMPRESSIONS  1. Left ventricular ejection fraction, by estimation, is 60 to 65%. The left ventricle has normal function. The left ventricle has no regional wall motion abnormalities. There is moderate left ventricular hypertrophy. Left ventricular diastolic parameters are consistent with Grade II diastolic dysfunction (pseudonormalization).  2. Right ventricular systolic function is mildly reduced. The right ventricular size is mildly enlarged. There is severely elevated pulmonary artery systolic pressure. The estimated right ventricular systolic pressure is 38.9 mmHg.  3. Left atrial size was mild to moderately dilated.  4. Right atrial size was mildly dilated.  5. The mitral valve is degenerative. Mild to moderate mitral valve  regurgitation. Mild to moderate mitral stenosis, mean gradient 11 mmHg but MVA 2.3 cm^2 by PHT. Suspect elevated gradient related in part to high flow. Moderate to severe mitral annular calcification.  6. Bioprosthetic aortic valve. Mean gradient 28 mmHg, moderately elevated. No significant regurgitation.  7. Tricuspid valve regurgitation is moderate.  8. The inferior vena cava is dilated in size with <50% respiratory variability, suggesting right atrial pressure of 15 mmHg.  9. Neither the bioprosthetic aortic valve nor the heavily calcified mitral valve/annulus were visualized well enough to rule out endocarditis. Suggest TEE. FINDINGS  Left Ventricle: Left ventricular ejection fraction, by estimation, is 60 to 65%. The left ventricle has normal function. The left ventricle has no regional wall motion abnormalities. The left ventricular  internal cavity size was normal in size. There is  moderate left ventricular hypertrophy. Left ventricular diastolic parameters are consistent with Grade II diastolic dysfunction (pseudonormalization). Right Ventricle: The right ventricular size is mildly enlarged. No increase in right ventricular wall thickness. Right ventricular systolic function is mildly reduced. There is severely elevated pulmonary artery systolic pressure. The tricuspid regurgitant velocity is 3.47 m/s, and with an assumed right atrial pressure of 15 mmHg, the estimated right ventricular systolic pressure is 24.5 mmHg. Left Atrium: Left atrial size was mild to moderately dilated. Right Atrium: Right atrial size was mildly dilated. Pericardium: There is no evidence of pericardial effusion. Mitral Valve: The mitral valve is degenerative in appearance. There is mild calcification of the mitral valve leaflet(s). Moderate to severe mitral annular calcification. Mild to moderate mitral valve regurgitation. Mild to moderate mitral valve stenosis. MV peak gradient, 21.3 mmHg. The mean mitral valve gradient is 11.0  mmHg. Tricuspid Valve: The tricuspid valve is normal in structure. Tricuspid valve regurgitation is moderate. Aortic Valve: Bioprosthetic aortic valve. Mean gradient 28 mmHg, moderately elevated. No significant regurgitation. The aortic valve has been repaired/replaced. Aortic valve regurgitation is not visualized. Aortic valve mean gradient measures 28.0 mmHg. Aortic valve peak gradient measures 43.8 mmHg. Aortic valve area, by VTI measures 2.02 cm. There is a 25 mm Edwards MAGNA EASE valve present in the aortic position. Procedure Date: 07/11/2012. Pulmonic Valve: The pulmonic valve was normal in structure. Pulmonic valve regurgitation is trivial. Aorta: The aortic root is normal in size and structure. Venous: The inferior vena cava is dilated in size with less than 50% respiratory variability, suggesting right atrial pressure of 15 mmHg. IAS/Shunts: No atrial level shunt detected by color flow Doppler.  LEFT VENTRICLE PLAX 2D LVIDd:         5.00 cm  Diastology LVIDs:         3.10 cm  LV e' medial:    5.55 cm/s LV PW:         1.40 cm  LV E/e' medial:  36.6 LV IVS:        1.80 cm  LV e' lateral:   7.29 cm/s LVOT diam:     2.70 cm  LV E/e' lateral: 27.8 LV SV:         134 LV SV Index:   58 LVOT Area:     5.73 cm                          3D Volume EF:                         3D EF:        63 %                         LV EDV:       322 ml                         LV ESV:       118 ml                         LV SV:        204 ml RIGHT VENTRICLE RV S prime:     10.00 cm/s TAPSE (M-mode): 1.6 cm LEFT ATRIUM  Index       RIGHT ATRIUM           Index LA diam:        5.30 cm 2.28 cm/m  RA Area:     21.60 cm LA Vol (A2C):   93.3 ml 40.08 ml/m RA Volume:   59.40 ml  25.52 ml/m LA Vol (A4C):   83.6 ml 35.92 ml/m LA Biplane Vol: 92.5 ml 39.74 ml/m  AORTIC VALVE AV Area (Vmax):    1.87 cm AV Area (Vmean):   1.87 cm AV Area (VTI):     2.02 cm AV Vmax:           331.00 cm/s AV Vmean:          231.333 cm/s AV  VTI:            0.665 m AV Peak Grad:      43.8 mmHg AV Mean Grad:      28.0 mmHg LVOT Vmax:         108.00 cm/s LVOT Vmean:        75.400 cm/s LVOT VTI:          0.234 m LVOT/AV VTI ratio: 0.35  AORTA Ao Asc diam: 3.50 cm MITRAL VALVE                TRICUSPID VALVE MV Area (PHT): 5.02 cm     TR Peak grad:   48.2 mmHg MV Peak grad:  21.3 mmHg    TR Vmax:        347.00 cm/s MV Mean grad:  11.0 mmHg MV Vmax:       2.31 m/s     SHUNTS MV Vmean:      149.5 cm/s   Systemic VTI:  0.23 m MV Decel Time: 151 msec     Systemic Diam: 2.70 cm MR Peak grad: 96.4 mmHg MR Mean grad: 68.0 mmHg MR Vmax:      491.00 cm/s MR Vmean:     401.0 cm/s MV E velocity: 203.00 cm/s MV A velocity: 187.00 cm/s MV E/A ratio:  1.09 Loralie Champagne MD Electronically signed by Loralie Champagne MD Signature Date/Time: 06/10/2020/2:33:55 PM    Final (Updated)     Labs: BMET Recent Labs  Lab 06/08/20 1621 06/09/20 0415 06/09/20 1012 06/10/20 0517 06/11/20 0125  NA 135 139 138 139 139  K 3.9 3.7 3.5 3.4* 4.1  CL 101 104 104 104 104  CO2 21* 18* 17* 17* 19*  GLUCOSE 146* 188* 190* 189* 182*  BUN 71* 75* 79* 90* 105*  CREATININE 5.53* 5.72* 6.06* 6.52* 6.85*  CALCIUM 8.6* 8.8* 8.6* 8.7* 8.7*  PHOS  --   --   --   --  6.1*   CBC Recent Labs  Lab 06/08/20 1332 06/09/20 0415 06/10/20 0517 06/11/20 0125  WBC 22.3* 18.5* 18.5* 19.1*  NEUTROABS 20.2*  --  17.4*  --   HGB 8.8* 8.0* 7.8* 7.8*  HCT 27.1* 25.4* 23.9* 24.8*  MCV 98.2 97.7 96.4 98.0  PLT 224 192 213 230    Medications:    . aspirin  325 mg Oral Daily  . calcitRIOL  0.25 mcg Oral QODAY  . cholecalciferol  2,000 Units Oral QHS  . cholecalciferol  4,000 Units Oral Daily  . docusate sodium  100 mg Oral BID  . finasteride  5 mg Oral Daily  . fluticasone  1 spray Each Nare Daily  . furosemide  40 mg Intravenous BID  . gentamicin cream  Topical Daily  . ipratropium-albuterol  3 mL Nebulization Q6H  . latanoprost  1 drop Both Eyes QHS  . mouth rinse  15 mL Mouth  Rinse BID  . [START ON 06/12/2020] methylPREDNISolone (SOLU-MEDROL) injection  40 mg Intravenous Q24H  . metoprolol tartrate  25 mg Oral BID  . multivitamin  1 tablet Oral Daily  . omega-3 acid ethyl esters  1 g Oral BID  . pantoprazole  40 mg Oral Daily  . polyethylene glycol  17 g Oral Daily  . senna-docusate  1 tablet Oral BID  . sertraline  100 mg Oral Daily  . simvastatin  40 mg Oral QPM  . sodium bicarbonate  1,300 mg Oral TID WC   Elmarie Shiley, MD 06/11/2020, 11:25 AM

## 2020-06-11 NOTE — Progress Notes (Signed)
ANTICOAGULATION CONSULT NOTE - Follow Up Consult  Pharmacy Consult for Heparin Indication: pulmonary embolus  No Known Allergies  Patient Measurements: Height: 5\' 9"  (175.3 cm) Weight: 125.6 kg (276 lb 14.4 oz) IBW/kg (Calculated) : 70.7 Heparin Dosing Weight: 97.9 kg  Vital Signs: Temp: 97.8 F (36.6 C) (12/14 1650) Temp Source: Oral (12/14 1650) BP: 125/64 (12/14 1650) Pulse Rate: 68 (12/14 1650)  Labs: Recent Labs    06/09/20 0415 06/09/20 1012 06/10/20 0517 06/11/20 0125 06/11/20 1854  HGB 8.0*  --  7.8* 7.8*  --   HCT 25.4*  --  23.9* 24.8*  --   PLT 192  --  213 230  --   HEPARINUNFRC  --   --   --   --  0.66  CREATININE 5.72* 6.06* 6.52* 6.85*  --     Estimated Creatinine Clearance: 12.2 mL/min (A) (by C-G formula based on SCr of 6.85 mg/dL (H)).   Medical History: Past Medical History:  Diagnosis Date  . Aortic stenosis   . Arthritis   . Atherosclerotic heart disease of native coronary artery without angina pectoris   . CAD (coronary artery disease)   . Chronic obstructive pulmonary disease, unspecified ([place])   . COPD (chronic obstructive pulmonary disease) ([place])   . Depression   . Essential (primary) hypertension   . Gout, unspecified   . Heart murmur   . Hemorrhoid   . Hyperlipidemia, unspecified   . Hypertension   . Hypothyroidism   . Hypothyroidism, unspecified   . Kidney stones   . Major depressive disorder, single episode, unspecified   . Morbid (severe) obesity due to excess calories ([place])   . Nonrheumatic aortic (valve) stenosis   . Orthostatic hypotension   . Syncope   . Thyroid disease    Assessment: 74 yr old male with ESRD on PD with recent AVF placement. 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 VQ scan showed "bilateral scattered segmental to subsegmental perfusion defects right more than left worrisome for pulmonary emboli". Pharmacy was consulted for heparin dosing; pt was on no  anticoagulants PTA. Patient has been receiving heparin SQ for VTE px (last dose at ~0520 AM today).   Initial heparin level after heparin 4000 units IV bolus X 1, followed by heparin infusion at 1650 units/hr, was 0.66 units/ml, which is within the goal range for this pt. H/H 7.8/24.8, platelets 230. Per RN, no issues with IV or bleeding observed.  Planned for TEE tomorrow, 12/15.  Goal of Therapy:  Heparin level 0.3-0.7 units/ml Monitor platelets by anticoagulation protocol: Yes   Plan:  Continue heparin infusion at 1650 units/hr Check confirmatory heparin level in 8 hrs Monitor daily heparin level, CBC Monitor for signs/symptoms of bleeding F/U transition to oral anticoagulant when able  Gillermina Hu, PharmD, BCPS, [facility] Clinical Pharmacist 06/11/2020,7:50 PM

## 2020-06-11 NOTE — Progress Notes (Signed)
PT Cancellation Note  Patient Details Name: [name] MRN: [id number] DOB: 1946-05-17   Cancelled Treatment:    Reason Eval/Treat Not Completed: Medical issues which prohibited therapy per chart, pulmonary perfusion imaging concerning for potential PE. On heparin, but no formal heparin level available in chart. Discussed with attending MD and in agreement to hold for today. Will f/u when medically appropriate.    Windell Norfolk, DPT, PN1   Supplemental Physical Therapist [facility]    Pager [phone] Acute Rehab Office [phone]

## 2020-06-12 ENCOUNTER — Inpatient Hospital Stay (HOSPITAL_COMMUNITY): Payer: Managed Care, Other (non HMO) | Admitting: Certified Registered Nurse Anesthetist

## 2020-06-12 ENCOUNTER — Encounter (HOSPITAL_COMMUNITY): Payer: Self-pay | Admitting: Internal Medicine

## 2020-06-12 ENCOUNTER — Encounter (HOSPITAL_COMMUNITY)
Admission: EM | Disposition: A | Discharge status: Home Health | Payer: Self-pay | Source: Home / Self Care | Attending: Internal Medicine

## 2020-06-12 ENCOUNTER — Inpatient Hospital Stay (HOSPITAL_COMMUNITY): Payer: Managed Care, Other (non HMO)

## 2020-06-12 DIAGNOSIS — I350 Nonrheumatic aortic (valve) stenosis: Secondary | ICD-10-CM

## 2020-06-12 DIAGNOSIS — I340 Nonrheumatic mitral (valve) insufficiency: Secondary | ICD-10-CM

## 2020-06-12 DIAGNOSIS — I361 Nonrheumatic tricuspid (valve) insufficiency: Secondary | ICD-10-CM

## 2020-06-12 DIAGNOSIS — I342 Nonrheumatic mitral (valve) stenosis: Secondary | ICD-10-CM

## 2020-06-12 HISTORY — PX: TEE WITHOUT CARDIOVERSION: SHX5443

## 2020-06-12 LAB — CBC
HCT: 26.5 % — ABNORMAL LOW (ref 39.0–52.0)
Hemoglobin: 8.2 g/dL — ABNORMAL LOW (ref 13.0–17.0)
MCH: 30.7 pg (ref 26.0–34.0)
MCHC: 30.9 g/dL (ref 30.0–36.0)
MCV: 99.3 fL (ref 80.0–100.0)
Platelets: 229 10*3/uL (ref 150–400)
RBC: 2.67 MIL/uL — ABNORMAL LOW (ref 4.22–5.81)
RDW: 14.5 % (ref 11.5–15.5)
WBC: 19.9 10*3/uL — ABNORMAL HIGH (ref 4.0–10.5)
nRBC: 1.2 % — ABNORMAL HIGH (ref 0.0–0.2)

## 2020-06-12 LAB — RENAL FUNCTION PANEL
Albumin: 2.5 g/dL — ABNORMAL LOW (ref 3.5–5.0)
Anion gap: 16 — ABNORMAL HIGH (ref 5–15)
BUN: 119 mg/dL — ABNORMAL HIGH (ref 8–23)
CO2: 20 mmol/L — ABNORMAL LOW (ref 22–32)
Calcium: 8.6 mg/dL — ABNORMAL LOW (ref 8.9–10.3)
Chloride: 104 mmol/L (ref 98–111)
Creatinine, Ser: 7.24 mg/dL — ABNORMAL HIGH (ref 0.61–1.24)
GFR, Estimated: 7 mL/min — ABNORMAL LOW (ref >60)
Glucose, Bld: 139 mg/dL — ABNORMAL HIGH (ref 70–99)
Phosphorus: 7.7 mg/dL — ABNORMAL HIGH (ref 2.5–4.6)
Potassium: 4.2 mmol/L (ref 3.5–5.1)
Sodium: 140 mmol/L (ref 135–145)

## 2020-06-12 LAB — ECHO TEE
AV Mean grad: 28 mmHg
AV Peak grad: 49.6 mmHg
Ao pk vel: 3.52 m/s
MV M vel: 5.18 m/s
MV Peak grad: 107.3 mmHg
Radius: 0.3 cm

## 2020-06-12 LAB — HEPARIN LEVEL (UNFRACTIONATED): Heparin Unfractionated: 0.75 IU/mL — ABNORMAL HIGH (ref 0.30–0.70)

## 2020-06-12 LAB — MAGNESIUM: Magnesium: 2.1 mg/dL (ref 1.7–2.4)

## 2020-06-12 SURGERY — ECHOCARDIOGRAM, TRANSESOPHAGEAL
Anesthesia: Monitor Anesthesia Care

## 2020-06-12 MED ORDER — HEPARIN 1000 UNIT/ML FOR PERITONEAL DIALYSIS
INTRAPERITONEAL | Status: DC | PRN
  Filled 2020-06-12 (×2): qty 5000

## 2020-06-12 MED ORDER — DELFLEX-LC/2.5% DEXTROSE 394 MOSM/L IP SOLN: INTRAPERITONEAL | Status: DC

## 2020-06-12 MED ORDER — SODIUM CHLORIDE 0.9% FLUSH
3.0000 mL | Freq: Two times a day (BID) | INTRAVENOUS | Status: DC
  Administered 2020-06-12 – 2020-06-14 (×4): 3 mL via INTRAVENOUS

## 2020-06-12 MED ORDER — PROPOFOL 10 MG/ML IV BOLUS
INTRAVENOUS | Status: DC | PRN
  Administered 2020-06-12: 20 mg via INTRAVENOUS

## 2020-06-12 MED ORDER — PROPOFOL 500 MG/50ML IV EMUL
INTRAVENOUS | Status: DC | PRN
  Administered 2020-06-12: 125 ug/kg/min via INTRAVENOUS
  Administered 2020-06-12: 100 ug/kg/min via INTRAVENOUS

## 2020-06-12 MED ORDER — SODIUM CHLORIDE 0.9% FLUSH: 3.0000 mL | INTRAVENOUS | Status: DC | PRN

## 2020-06-12 MED ORDER — GUAIFENESIN-DM 100-10 MG/5ML PO SYRP
5.0000 mL | ORAL_SOLUTION | ORAL | Status: DC | PRN
  Administered 2020-06-12: 5 mL via ORAL
  Filled 2020-06-12: qty 5

## 2020-06-12 MED ORDER — SODIUM CHLORIDE 0.9 % IV SOLN: INTRAVENOUS | Status: DC | PRN

## 2020-06-12 MED ORDER — LIDOCAINE 2% (20 MG/ML) 5 ML SYRINGE
INTRAMUSCULAR | Status: DC | PRN
  Administered 2020-06-12: 100 mg via INTRAVENOUS

## 2020-06-12 MED ORDER — PREDNISONE 20 MG PO TABS
40.0000 mg | ORAL_TABLET | Freq: Every day | ORAL | Status: DC
  Administered 2020-06-13 – 2020-06-14 (×2): 40 mg via ORAL
  Filled 2020-06-12 (×2): qty 2

## 2020-06-12 MED ORDER — HEPARIN 1000 UNIT/ML FOR PERITONEAL DIALYSIS: 500.0000 [IU] | INTRAMUSCULAR | Status: DC | PRN

## 2020-06-12 NOTE — Anesthesia Procedure Notes (Signed)
Procedure Name: MAC Date/Time: 06/12/2020 1:43 PM Performed by: Candis Shine, CRNA Pre-anesthesia Checklist: Patient identified, Emergency Drugs available, Suction available and Patient being monitored Patient Re-evaluated:Patient Re-evaluated prior to induction Oxygen Delivery Method: Nasal cannula Dental Injury: Teeth and Oropharynx as per pre-operative assessment

## 2020-06-12 NOTE — Progress Notes (Signed)
Occupational Therapy Treatment Patient Details Name: [name] MRN: [id number] DOB: 09/25/1945 Today's Date: 06/12/2020    History of present illness 74 y.o. male, with past medical history of COPD, CAD, hypertension, depression, hypothyroidism, hyperlipidemia, obesity, MGUS, patient with progressive renal disease, is being planned to initiate dialysis, he is is been followed by [facility] nephrology, patient had peritoneal dialysis catheter inserted on 05/14/2020, and left aVF inserted by Dr. Dixie Dials 70/08/5007, patient had 5 session of training peritoneal dialysis last week, per wife report he did receive IV iron for anemia, she reports he did have 1600 cc negative balance during session yesterday, patient presented to ED secondary to complaints of fever and chills, he is vaccinated against Covid, reports progressive dyspnea over the last couple days, worsening lower extremity edema over last 2 weeks, as well he does report some wheezing, dry heaving, he denies any chest pain.   OT comments  Pt progressing well. Pt wanting to perform ADL functional mobility. Pt performing ambulation task in room and hallway with minguardA, O2 >90% on 3L and able to bump down to 2L with exertion staying >90% O2. VSS. Pt ambulating 130' with standing rest breaks. Energy conservation technique education initiated. Pt would benefit from continued OT skilled services for ADL, mobiltiy and energy conservation. OT following acutely.    Follow Up Recommendations  Home health OT    Equipment Recommendations  None recommended by OT    Recommendations for Other Services      Precautions / Restrictions Precautions Precautions: Fall Precaution Comments: Pt reports h/o falls. Restrictions Weight Bearing Restrictions: No       Mobility Bed Mobility               General bed mobility comments: at recliner  Transfers Overall transfer level: Needs assistance Equipment used: 1 person hand held  assist Transfers: Stand Pivot Transfers;Sit to/from Stand Sit to Stand: Min guard Stand pivot transfers: Min guard       General transfer comment: assist from OTR and spouse    Balance Overall balance assessment: Needs assistance Sitting-balance support: No upper extremity supported;Feet supported Sitting balance-Leahy Scale: Good     Standing balance support: Single extremity supported;No upper extremity supported;During functional activity Standing balance-Leahy Scale: Fair                             ADL either performed or assessed with clinical judgement   ADL Overall ADL's : Needs assistance/impaired                                     Functional mobility during ADLs: Min guard;+2 for physical assistance General ADL Comments: Pt performing ambulation task in room and hallway with minguardA, O2 >90% on 3L and able to bump down to 2L with exertion staying >90% O2. VSS. Pt ambulating 130' with standing rest breaks.     Vision   Vision Assessment?: No apparent visual deficits   Perception     Praxis      Cognition Arousal/Alertness: Awake/alert Behavior During Therapy: WFL for tasks assessed/performed Overall Cognitive Status: Within Functional Limits for tasks assessed                                 General Comments: intention tremor noted. Pt reports its worse now due  to anxiety.        Exercises     Shoulder Instructions       General Comments VSS on 2-3L O2.    Pertinent Vitals/ Pain       Pain Assessment: Faces Faces Pain Scale: Hurts a little bit Pain Location: chest for deep breaths Pain Descriptors / Indicators: Discomfort Pain Intervention(s): Monitored during session;Premedicated before session;Repositioned  Home Living                                          Prior Functioning/Environment              Frequency  Min 2X/week        Progress Toward Goals  OT  Goals(current goals can now be found in the care plan section)  Progress towards OT goals: Progressing toward goals  Acute Rehab OT Goals Patient Stated Goal: home OT Goal Formulation: With patient Time For Goal Achievement: 06/25/20 Potential to Achieve Goals: Good ADL Goals Pt Will Perform Grooming: with modified independence;standing Pt Will Perform Lower Body Dressing: with min guard assist;sit to/from stand Pt/caregiver will Perform Home Exercise Program: Increased strength;Both right and left upper extremity Additional ADL Goal #1: Pt will utilize/state 3 energy conservation techniques to increase independence for ADL and mobility  Plan Discharge plan remains appropriate    Co-evaluation                 AM-PAC OT "6 Clicks" Daily Activity     Outcome Measure   Help from another person eating meals?: None Help from another person taking care of personal grooming?: None Help from another person toileting, which includes using toliet, bedpan, or urinal?: A Little Help from another person bathing (including washing, rinsing, drying)?: A Lot Help from another person to put on and taking off regular upper body clothing?: None Help from another person to put on and taking off regular lower body clothing?: A Lot 6 Click Score: 19    End of Session Equipment Utilized During Treatment: Gait belt;Oxygen  OT Visit Diagnosis: Unsteadiness on feet (R26.81);Muscle weakness (generalized) (M62.81)   Activity Tolerance Patient tolerated treatment well   Patient Left in chair;with call bell/phone within reach;with chair alarm set;with family/visitor present   Nurse Communication Mobility status        Time: 2423-5361 OT Time Calculation (min): 24 min  Charges: OT General Charges $OT Visit: 1 Visit OT Treatments $Therapeutic Activity: 23-37 mins  Jefferey Pica, OTR/L Acute Rehabilitation Services Pager: [phone] Office: [phone]    Clariza Sickman C 06/12/2020, 6:23  PM

## 2020-06-12 NOTE — Anesthesia Preprocedure Evaluation (Signed)
Anesthesia Evaluation  Patient identified by MRN, date of birth, ID band Patient awake    Reviewed: Allergy & Precautions, H&P , NPO status , Patient's Chart, lab work & pertinent test results, reviewed documented beta blocker date and time   Airway Mallampati: III  TM Distance: >3 FB Neck ROM: Full    Dental no notable dental hx. (+) Edentulous Upper, Edentulous Lower, Dental Advisory Given   Pulmonary COPD,  COPD inhaler, former smoker,    Pulmonary exam normal breath sounds clear to auscultation       Cardiovascular hypertension, Pt. on medications and Pt. on home beta blockers + CAD and +CHF   Rhythm:Regular Rate:Normal     Neuro/Psych Depression negative neurological ROS     GI/Hepatic negative GI ROS, Neg liver ROS,   Endo/Other  Hypothyroidism Morbid obesity  Renal/GU Renal InsufficiencyRenal disease  negative genitourinary   Musculoskeletal  (+) Arthritis , Osteoarthritis,    Abdominal   Peds  Hematology negative hematology ROS (+)   Anesthesia Other Findings   Reproductive/Obstetrics negative OB ROS                             Anesthesia Physical Anesthesia Plan  ASA: III  Anesthesia Plan: MAC   Post-op Pain Management:    Induction: Intravenous  PONV Risk Score and Plan: 1 and Propofol infusion  Airway Management Planned: Nasal Cannula  Additional Equipment:   Intra-op Plan:   Post-operative Plan:   Informed Consent: I have reviewed the patients History and Physical, chart, labs and discussed the procedure including the risks, benefits and alternatives for the proposed anesthesia with the patient or authorized representative who has indicated his/her understanding and acceptance.     Dental advisory given  Plan Discussed with: CRNA  Anesthesia Plan Comments:         Anesthesia Quick Evaluation

## 2020-06-12 NOTE — Progress Notes (Signed)
ANTICOAGULATION CONSULT NOTE - Follow Up Consult  Pharmacy Consult for Heparin Indication: pulmonary embolus  Recent Labs    06/09/20 1012 06/10/20 0517 06/10/20 0517 06/11/20 0125 06/11/20 1854 06/12/20 0442  HGB  --  7.8*   < > 7.8*  --  8.2*  HCT  --  23.9*  --  24.8*  --  26.5*  PLT  --  213  --  230  --  229  HEPARINUNFRC  --   --   --   --  0.66 0.75*  CREATININE 6.06* 6.52*  --  6.85*  --   --    < > = values in this interval not displayed.    Estimated Creatinine Clearance: 12.2 mL/min (A) (by C-G formula based on SCr of 6.85 mg/dL (H)).  Assessment: 74 yr old male with ESRD on PD with recent AVF placement. 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 VQ scan showed "bilateral scattered segmental to subsegmental perfusion defects right more than left worrisome for pulmonary emboli". Pharmacy was consulted for heparin dosing; pt was on no anticoagulants PTA. Patient has been receiving heparin SQ for VTE px (last dose at ~0520 AM today).   12/15  Heparin level 0.75 units/ml  Planned for TEE 12/15.  Goal of Therapy:  Heparin level 0.3-0.7 units/ml Monitor platelets by anticoagulation protocol: Yes   Plan:  Continue heparin infusion at 1650 units/hr Monitor daily heparin level, CBC Monitor for signs/symptoms of bleeding F/U transition to oral anticoagulant when able  Thanks for allowing pharmacy to be a part of this patient's care.  Excell Seltzer, PharmD Clinical Pharmacist  06/12/2020,5:21 AM

## 2020-06-12 NOTE — Plan of Care (Signed)
  Problem: Activity: Goal: Risk for activity intolerance will decrease Outcome: Progressing   

## 2020-06-12 NOTE — Progress Notes (Signed)
PT Cancellation Note  Patient Details Name: [name] MRN: [id number] DOB: January 09, 1946   Cancelled Treatment:    Reason Eval/Treat Not Completed: Patient at procedure or test/unavailable. Pt at endoscopy. Will check back as time allows.    Allena Katz 06/12/2020, 12:16 PM

## 2020-06-12 NOTE — Progress Notes (Signed)
Patient ID: [name], male   DOB: 1946-04-17, 74 y.o.   MRN: [id number] [facility] Progress Note   Assessment/ Plan:   1.  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  He has a recently placed left radiocephalic fistula with well-healed surgical incision site.  He does not have any uremic symptoms at this time but has significant pedal edema that has been unresponsive to diuretics; will begin low volume peritoneal dialysis for attempts at augmenting volume removal/ultrafiltration and clearance. 2.  Acute exacerbation of congestive heart failure: Possibly with concomitant pneumonia versus COPD exacerbation.  Continue current dose of furosemide as we supplement volume unloading with PT. 3.  Enterococcus bacteremia: On antibiotic coverage with ceftriaxone and ampicillin.  Transthoracic echocardiogram did not show any valvular vegetation and plans noted for TEE today. 4.  Nongap metabolic acidosis: Secondary to progressive chronic kidney disease, on oral sodium bicarbonate. 5.  Anemia: Likely secondary to chronic kidney disease, iron stores are replete.  Will treat with Aranesp.  Subjective:   Reports that he continues to feel well except for pedal edema-inquires about going home.  He denies any abnormal limb jerking movements, worsening shortness of breath/chest pain or nausea/vomiting or dysgeusia.   Objective:   BP 127/74 (BP Location: Left Arm)   Pulse 73   Temp 98.6 F (37 C) (Oral)   Resp 18   Ht 5\' 9"  (1.753 m)   Wt 128.7 kg   SpO2 98%   BMI 41.90 kg/m   Intake/Output Summary (Last 24 hours) at 06/12/2020 1042 Last data filed at 06/12/2020 0800 Gross per 24 hour  Intake 680.72 ml  Output 1200 ml  Net -519.28 ml   Weight change: 3.1 kg  Physical Exam: Gen: Appears comfortable resting in recliner, wife at bedside CVS: Pulse regular  rhythm, normal rate, S1 and S2 normal Resp: Poor inspiratory effort with decreased breath sounds over bases Abd: Soft, obese, nontender.  PD catheter in situ Ext: 2+ pitting lower extremity edema.  Left RCF with palpable thrill.  Imaging: NM Pulmonary Perfusion  Result Date: 06/10/2020 CLINICAL DATA:  Elevated D-dimer. Hypoxia. Question pulmonary emboli. EXAM: NUCLEAR MEDICINE PERFUSION LUNG SCAN TECHNIQUE: Perfusion images were obtained in multiple projections after intravenous injection of radiopharmaceutical. Ventilation scans intentionally deferred if perfusion scan and chest x-ray adequate for interpretation during COVID 19 epidemic. RADIOPHARMACEUTICALS:  4.3 mCi Tc-79m MAA IV COMPARISON:  Chest radiography same day FINDINGS: Bilateral scattered segmental to subsegmental perfusion defects, most notable in the mid and lower right lung, worrisome for pulmonary emboli. There is no chest radiographic abnormality to explain this. Ventilation study no longer performed. IMPRESSION: Bilateral scattered segmental to subsegmental perfusion defects right more than left worrisome for pulmonary emboli. Electronically Signed   By: Nelson Chimes M.D.   On: 06/10/2020 13:59   ECHOCARDIOGRAM COMPLETE  Result Date: 06/10/2020    ECHOCARDIOGRAM REPORT   Patient Name:   [name] Date of Exam: 06/10/2020 Medical Rec #:  [id number]     Height:       69.0 in Accession #:    [id number]    Weight:       264.8 lb Date of Birth:  1945-07-17     BSA:          2.328 m Patient Age:    56 years      BP:  113/61 mmHg Patient Gender: M             HR:           88 bpm. Exam Location:  Inpatient Procedure: 2D Echo, 3D Echo, Strain Analysis, Color Doppler and Cardiac Doppler Indications:     Bacteremia 790.7 / R78.81  History:         Patient has prior history of Echocardiogram examinations, most                  recent 07/07/2017. CAD, Prior CABG, COPD,                  Signs/Symptoms:Shortness of Breath; Risk  Factors:Former Smoker,                  Hypertension and Dyslipidemia. Acute exacerbation of congestive                  heart failure, Enterococcus bacteremia, End stage renal                  disease, thyroid disease.                  Aortic Valve: 25 mm Edwards MAGNA EASE valve is present in the                  aortic position. Procedure Date: 07/11/2012.  Sonographer:     Darlina Sicilian RDCS Referring Phys:  [id number] [place] T VU Diagnosing Phys: Loralie Champagne MD IMPRESSIONS  1. Left ventricular ejection fraction, by estimation, is 60 to 65%. The left ventricle has normal function. The left ventricle has no regional wall motion abnormalities. There is moderate left ventricular hypertrophy. Left ventricular diastolic parameters are consistent with Grade II diastolic dysfunction (pseudonormalization).  2. Right ventricular systolic function is mildly reduced. The right ventricular size is mildly enlarged. There is severely elevated pulmonary artery systolic pressure. The estimated right ventricular systolic pressure is 28.3 mmHg.  3. Left atrial size was mild to moderately dilated.  4. Right atrial size was mildly dilated.  5. The mitral valve is degenerative. Mild to moderate mitral valve regurgitation. Mild to moderate mitral stenosis, mean gradient 11 mmHg but MVA 2.3 cm^2 by PHT. Suspect elevated gradient related in part to high flow. Moderate to severe mitral annular calcification.  6. Bioprosthetic aortic valve. Mean gradient 28 mmHg, moderately elevated. No significant regurgitation.  7. Tricuspid valve regurgitation is moderate.  8. The inferior vena cava is dilated in size with <50% respiratory variability, suggesting right atrial pressure of 15 mmHg.  9. Neither the bioprosthetic aortic valve nor the heavily calcified mitral valve/annulus were visualized well enough to rule out endocarditis. Suggest TEE. FINDINGS  Left Ventricle: Left ventricular ejection fraction, by estimation, is 60 to 65%. The left  ventricle has normal function. The left ventricle has no regional wall motion abnormalities. The left ventricular internal cavity size was normal in size. There is  moderate left ventricular hypertrophy. Left ventricular diastolic parameters are consistent with Grade II diastolic dysfunction (pseudonormalization). Right Ventricle: The right ventricular size is mildly enlarged. No increase in right ventricular wall thickness. Right ventricular systolic function is mildly reduced. There is severely elevated pulmonary artery systolic pressure. The tricuspid regurgitant velocity is 3.47 m/s, and with an assumed right atrial pressure of 15 mmHg, the estimated right ventricular systolic pressure is 15.1 mmHg. Left Atrium: Left atrial size was mild to moderately dilated. Right Atrium: Right atrial size was mildly dilated. Pericardium:  There is no evidence of pericardial effusion. Mitral Valve: The mitral valve is degenerative in appearance. There is mild calcification of the mitral valve leaflet(s). Moderate to severe mitral annular calcification. Mild to moderate mitral valve regurgitation. Mild to moderate mitral valve stenosis. MV peak gradient, 21.3 mmHg. The mean mitral valve gradient is 11.0 mmHg. Tricuspid Valve: The tricuspid valve is normal in structure. Tricuspid valve regurgitation is moderate. Aortic Valve: Bioprosthetic aortic valve. Mean gradient 28 mmHg, moderately elevated. No significant regurgitation. The aortic valve has been repaired/replaced. Aortic valve regurgitation is not visualized. Aortic valve mean gradient measures 28.0 mmHg. Aortic valve peak gradient measures 43.8 mmHg. Aortic valve area, by VTI measures 2.02 cm. There is a 25 mm Edwards MAGNA EASE valve present in the aortic position. Procedure Date: 07/11/2012. Pulmonic Valve: The pulmonic valve was normal in structure. Pulmonic valve regurgitation is trivial. Aorta: The aortic root is normal in size and structure. Venous: The inferior  vena cava is dilated in size with less than 50% respiratory variability, suggesting right atrial pressure of 15 mmHg. IAS/Shunts: No atrial level shunt detected by color flow Doppler.  LEFT VENTRICLE PLAX 2D LVIDd:         5.00 cm  Diastology LVIDs:         3.10 cm  LV e' medial:    5.55 cm/s LV PW:         1.40 cm  LV E/e' medial:  36.6 LV IVS:        1.80 cm  LV e' lateral:   7.29 cm/s LVOT diam:     2.70 cm  LV E/e' lateral: 27.8 LV SV:         134 LV SV Index:   58 LVOT Area:     5.73 cm                          3D Volume EF:                         3D EF:        63 %                         LV EDV:       322 ml                         LV ESV:       118 ml                         LV SV:        204 ml RIGHT VENTRICLE RV S prime:     10.00 cm/s TAPSE (M-mode): 1.6 cm LEFT ATRIUM             Index       RIGHT ATRIUM           Index LA diam:        5.30 cm 2.28 cm/m  RA Area:     21.60 cm LA Vol (A2C):   93.3 ml 40.08 ml/m RA Volume:   59.40 ml  25.52 ml/m LA Vol (A4C):   83.6 ml 35.92 ml/m LA Biplane Vol: 92.5 ml 39.74 ml/m  AORTIC VALVE AV Area (Vmax):    1.87 cm AV Area (Vmean):   1.87 cm AV Area (VTI):  2.02 cm AV Vmax:           331.00 cm/s AV Vmean:          231.333 cm/s AV VTI:            0.665 m AV Peak Grad:      43.8 mmHg AV Mean Grad:      28.0 mmHg LVOT Vmax:         108.00 cm/s LVOT Vmean:        75.400 cm/s LVOT VTI:          0.234 m LVOT/AV VTI ratio: 0.35  AORTA Ao Asc diam: 3.50 cm MITRAL VALVE                TRICUSPID VALVE MV Area (PHT): 5.02 cm     TR Peak grad:   48.2 mmHg MV Peak grad:  21.3 mmHg    TR Vmax:        347.00 cm/s MV Mean grad:  11.0 mmHg MV Vmax:       2.31 m/s     SHUNTS MV Vmean:      149.5 cm/s   Systemic VTI:  0.23 m MV Decel Time: 151 msec     Systemic Diam: 2.70 cm MR Peak grad: 96.4 mmHg MR Mean grad: 68.0 mmHg MR Vmax:      491.00 cm/s MR Vmean:     401.0 cm/s MV E velocity: 203.00 cm/s MV A velocity: 187.00 cm/s MV E/A ratio:  1.09 Loralie Champagne MD  Electronically signed by Loralie Champagne MD Signature Date/Time: 06/10/2020/2:33:55 PM    Final (Updated)     Labs: BMET Recent Labs  Lab 06/08/20 1621 06/09/20 0415 06/09/20 1012 06/10/20 0517 06/11/20 0125 06/12/20 0442  NA 135 139 138 139 139 140  K 3.9 3.7 3.5 3.4* 4.1 4.2  CL 101 104 104 104 104 104  CO2 21* 18* 17* 17* 19* 20*  GLUCOSE 146* 188* 190* 189* 182* 139*  BUN 71* 75* 79* 90* 105* 119*  CREATININE 5.53* 5.72* 6.06* 6.52* 6.85* 7.24*  CALCIUM 8.6* 8.8* 8.6* 8.7* 8.7* 8.6*  PHOS  --   --   --   --  6.1* 7.7*   CBC Recent Labs  Lab 06/08/20 1332 06/09/20 0415 06/10/20 0517 06/11/20 0125 06/12/20 0442  WBC 22.3* 18.5* 18.5* 19.1* 19.9*  NEUTROABS 20.2*  --  17.4*  --   --   HGB 8.8* 8.0* 7.8* 7.8* 8.2*  HCT 27.1* 25.4* 23.9* 24.8* 26.5*  MCV 98.2 97.7 96.4 98.0 99.3  PLT 224 192 213 230 229    Medications:    . aspirin  325 mg Oral Daily  . calcitRIOL  0.25 mcg Oral QODAY  . cholecalciferol  2,000 Units Oral QHS  . cholecalciferol  4,000 Units Oral Daily  . docusate sodium  100 mg Oral BID  . finasteride  5 mg Oral Daily  . fluticasone  1 spray Each Nare Daily  . furosemide  40 mg Intravenous BID  . gentamicin cream   Topical Daily  . latanoprost  1 drop Both Eyes QHS  . levothyroxine  175 mcg Oral Q0600  . mouth rinse  15 mL Mouth Rinse BID  . methylPREDNISolone (SOLU-MEDROL) injection  40 mg Intravenous Q24H  . metoprolol tartrate  25 mg Oral BID  . multivitamin  1 tablet Oral Daily  . omega-3 acid ethyl esters  1 g Oral BID  . pantoprazole  40 mg Oral Daily  . polyethylene  glycol  17 g Oral Daily  . senna-docusate  1 tablet Oral BID  . sertraline  100 mg Oral Daily  . simvastatin  40 mg Oral QPM  . sodium bicarbonate  1,300 mg Oral TID WC  . sodium chloride flush  3 mL Intravenous Q12H   Elmarie Shiley, MD 06/12/2020, 10:42 AM

## 2020-06-12 NOTE — Progress Notes (Signed)
OT Cancellation Note  Patient Details Name: DEMARRIO MENGES MRN: [id number] DOB: Nov 25, 1945   Cancelled Treatment:    Reason Eval/Treat Not Completed: Patient at procedure or test/ unavailable (Pt going for endoscopy)  OT to follow for OT intervention.  Jefferey Pica, OTR/L Acute Rehabilitation Services Pager: [phone] Office: [phone]   Jefferey Pica 06/12/2020, 12:03 PM

## 2020-06-12 NOTE — H&P (Signed)
[name] is a 74 y.o. male who has presented today for surgery, with the diagnosis of bacteremia.  The various methods of treatment have been discussed with the patient and family. After consideration of risks, benefits and other options for treatment, the patient has consented to  Procedure(s): TRANSESOPHAGEAL ECHOCARDIOGRAM (TEE) (N/A) as a surgical intervention .  The patient's history has been reviewed, patient examined, no change in status, stable for surgery.  I have reviewed the patient's chart and labs.  Questions were answered to the patient's satisfaction.    Ladena Jacquez C. Oval Linsey, MD, [facility]  06/12/2020 2:23 PM

## 2020-06-12 NOTE — CV Procedure (Signed)
Brief TEE Note  LVEF 60-65% Moderate tricuspid regurgitation Severe mitral annular calcification with mild mitral regurgitation and mild-moderate mitral stenosis.  Bioprosthetic aortic valve moderately stenosed.  No aortic regurgitation. No LA/LAA thrombus. No evidence of endocarditis.  [name]. Austin Linsey, MD, [facility] 06/12/2020 2:30 PM

## 2020-06-12 NOTE — Progress Notes (Signed)
PROGRESS NOTE    [name]  XVQ:[id number] DOB: 1945/11/23 DOA: 06/08/2020 PCP: Celene Squibb, MD   Brief Narrative: 74 year old with past medical history significant for COPD, CAD, hypertension, depression, history of aortic valve replacement, hypothyroidism, hyperlipidemia, obesity, MGUS, CKD status post peritoneal dialysis catheter on 05/14/2020 and left aVF inserted by Dr. Dixie Dials 09/32/6712 at [facility] who had 5 section of training peritoneal dialysis last week presented to the hospital with fevers, chills and progressive dyspnea with worsening lower extremity edema with cough and wheezing.  In the ED patient was noted to be hypoxic oxygen saturation in the mid 80s on room air, tolerating 2 L of oxygen.  Chest x-ray was significant for diffuse multifocal opacity related to volume overload, versus infectious process.  Patient was febrile temperature 101, leukocytosis, procalcitonin at 1.2, CRP 16, D-dimer 2.6, hemoglobin 8.8.  Patient was a started on broad-spectrum antibiotics was admitted for further evaluation.  Patient was found to have enterococcal bacteremia, underwent TEE that was negative for endocarditis.  ID currently following.     Assessment & Plan:   Principal Problem:   Enterococcal bacteremia Active Problems:   CAD (coronary artery disease)   Depression   Essential (primary) hypertension   Chronic obstructive pulmonary disease, unspecified (HCC)   Hyperlipidemia, unspecified   Hypothyroidism, unspecified   Atherosclerotic heart disease of native coronary artery without angina pectoris   Morbid (severe) obesity due to excess calories (HCC)   Fever   Severe sepsis without septic shock (CODE) (HCC)   S/P AVR (aortic valve replacement)   Acute on chronic congestive heart failure (HCC)   Demand ischemia (HCC)   Elevated d-dimer  1-Acute hypoxic respiratory failure: Patient presented with oxygen saturation in the 80s requiring 2 L of  oxygen. Could be multifactorial secondary to volume overload, acute on chronic diastolic heart failure, COPD and possible pneumonia. Decreased scan from 12/13 show bilateral scattered segmental to subsegmental perfusion defects worrisome for pulmonary embolism. Patient was a started on heparin drip. transition to Eliquis when no further procedure are planned.  Continue with IV Laxis. Nephrology  planning to initiate peritoneal dialysis.   2-Enterococcal bacteremia: Patient presented with fever.  Peritoneal fluid Gram stain without organisms. 2D echo negative.  TEE negative. Continue with Rocephin and ampicillin. Follow ID recommendation for length of antibiotics  3-COPD exacerbation: Received IV Solu-Medrol.  Plan to transition to oral prednisone tomorrow  4-Possible multifocal pneumonia: Continue with IV antibiotics. Wean off oxygen as possible.  5-ESRD with evidence of significant volume overload, non-anion gap metabolic acidosis: Patient was being trained for peritoneal dialysis at home. Plan to proceed with peritoneal dialysis today. Nephrology following. Left AV fistula inserted by Dr. Dixie Dials 45/01/997. Peritoneal hemodialysis inserted by general surgery at [facility] in [place] on 7/16. Continue with sodium bicarb tablet  Anemia of chronic kidney disease: Continue to monitor hemoglobin.  Hypertension: Continue with metoprolol. Continue to hold Norvasc and hydralazine. CAD: Continue with aspirin and statin and beta-blockers. Hypothyroidism: Continue with Synthroid BPH: Continue with Proscar Depression: Continue with sertraline Hyperlipidemia: Continue with simvastatin.    Estimated body mass index is 41.9 kg/m as calculated from the following:   Height as of this encounter: 5\' 9"  (1.753 m).   Weight as of this encounter: 128.7 kg.   DVT prophylaxis: Heparin  drip Code Status: Full code Family Communication: Wife who was at bedside Disposition Plan:   Status is: Inpatient  Remains inpatient appropriate because:Ongoing diagnostic testing needed not appropriate  for outpatient work up   Dispo: The patient is from: Home              Anticipated d/c is to: Home              Anticipated d/c date is: 2 days              Patient currently is not medically stable to d/c.        Consultants:   ID Cariology  Nephrology    Procedures:   none  Antimicrobials:    Subjective: He report LE edema. Denies worsening dyspnea.  He is awaiting to have TEE.  He had BM. He has been eating well.    Objective: Vitals:   06/12/20 1227 06/12/20 1435 06/12/20 1445 06/12/20 1455  BP: (!) 168/73 (!) 111/48 (!) 115/51 (!) 129/58  Pulse:  72 70 70  Resp: (!) 28 12 15 10   Temp: (!) 97.5 F (36.4 C) 97.6 F (36.4 C)    TempSrc: Oral Temporal    SpO2: 100% 100% 98% 100%  Weight:      Height:        Intake/Output Summary (Last 24 hours) at 06/12/2020 1707 Last data filed at 06/12/2020 1424 Gross per 24 hour  Intake 780.36 ml  Output 900 ml  Net -119.64 ml   Filed Weights   06/09/20 2111 06/10/20 2021 06/12/20 0528  Weight: 120.1 kg 125.6 kg 128.7 kg    Examination:  General exam: Appears calm and comfortable  Respiratory system: Clear to auscultation. Respiratory effort normal. Cardiovascular system: S1 & S2 heard, RRR. No JVD, murmurs, rubs, gallops or clicks. No pedal edema. Gastrointestinal system: Abdomen is nondistended, soft and nontender. No organomegaly or masses felt. Normal bowel sounds heard. Central nervous system: Alert and oriented. No focal neurological deficits. Extremities: Symmetric 5 x 5 power.    Data Reviewed: I have personally reviewed following labs and imaging studies  CBC: Recent Labs  Lab 06/08/20 1332 06/09/20 0415 06/10/20 0517 06/11/20 0125 06/12/20 0442  WBC 22.3* 18.5* 18.5* 19.1* 19.9*  NEUTROABS 20.2*  --  17.4*  --   --   HGB 8.8* 8.0* 7.8* 7.8* 8.2*  HCT 27.1* 25.4* 23.9*  24.8* 26.5*  MCV 98.2 97.7 96.4 98.0 99.3  PLT 224 192 213 230 542   Basic Metabolic Panel: Recent Labs  Lab 06/09/20 0415 06/09/20 1012 06/10/20 0517 06/11/20 0125 06/12/20 0442  NA 139 138 139 139 140  K 3.7 3.5 3.4* 4.1 4.2  CL 104 104 104 104 104  CO2 18* 17* 17* 19* 20*  GLUCOSE 188* 190* 189* 182* 139*  BUN 75* 79* 90* 105* 119*  CREATININE 5.72* 6.06* 6.52* 6.85* 7.24*  CALCIUM 8.8* 8.6* 8.7* 8.7* 8.6*  MG  --   --   --  2.2 2.1  PHOS  --   --   --  6.1* 7.7*   GFR: Estimated Creatinine Clearance: 11.7 mL/min (A) (by C-G formula based on SCr of 7.24 mg/dL (H)). Liver Function Tests: Recent Labs  Lab 06/08/20 1621 06/09/20 1012 06/11/20 0125 06/12/20 0442  AST 24 27 36  --   ALT 14 13 20   --   ALKPHOS 56 49 48  --   BILITOT 0.6 0.6 0.5  --   PROT 6.9 6.1* 6.1*  --   ALBUMIN 3.1* 2.4* 2.5* 2.5*   No results for input(s): LIPASE, AMYLASE in the last 168 hours. No results for input(s): AMMONIA in the last 168  hours. Coagulation Profile: No results for input(s): INR, PROTIME in the last 168 hours. Cardiac Enzymes: No results for input(s): CKTOTAL, CKMB, CKMBINDEX, TROPONINI in the last 168 hours. BNP (last 3 results) No results for input(s): PROBNP in the last 8760 hours. HbA1C: No results for input(s): HGBA1C in the last 72 hours. CBG: No results for input(s): GLUCAP in the last 168 hours. Lipid Profile: No results for input(s): CHOL, HDL, LDLCALC, TRIG, CHOLHDL, LDLDIRECT in the last 72 hours. Thyroid Function Tests: No results for input(s): TSH, T4TOTAL, FREET4, T3FREE, THYROIDAB in the last 72 hours. Anemia Panel: Recent Labs    06/10/20 1218  FERRITIN 472*  TIBC 227*  IRON 123   Sepsis Labs: Recent Labs  Lab 06/08/20 1621 06/08/20 1942  PROCALCITON 1.26  --   LATICACIDVEN 1.1 0.8    Recent Results (from the past 240 hour(s))  Blood Culture (routine x 2)     Status: Abnormal   Collection Time: 06/08/20  4:21 PM   Specimen: BLOOD   Result Value Ref Range Status   Specimen Description   Final    BLOOD Performed at [facility], [address], [place], [place] [address]    Special Requests   Final    NONE Performed at [facility], [address], [place], [place] [address]    Culture  Setup Time   Final    GRAM POSITIVE COCCI Gram Stain Report Called to,Read Back By and Verified With: HEATHER CRAWFORD,RN @0741  06/09/2020 KAY IN BOTH AEROBIC AND ANAEROBIC BOTTLES    Culture (A)  Final    ENTEROCOCCUS FAECALIS SUSCEPTIBILITIES PERFORMED ON PREVIOUS CULTURE WITHIN THE LAST 5 DAYS. Performed at [facility], [place] [address] [place], [place] [address]    Report Status 06/11/2020 FINAL  Final  Blood Culture (routine x 2)     Status: Abnormal   Collection Time: 06/08/20  4:26 PM   Specimen: BLOOD  Result Value Ref Range Status   Specimen Description   Final    BLOOD Performed at [facility], [address] [place], [place] [address]    Special Requests   Final    NONE Performed at [facility], [address], [place], [place] [address]    Culture  Setup Time   Final    GRAM POSITIVE COCCI ANAEROBIC BOTTLE ONLY Gram Stain Report Called to,Read Back By and Verified With: HEATHER CRAWFORD,RN @0741  06/09/2020 KAY GRAM POSITIVE COCCI AEROBIC BOTTLE ONLY Organism ID to follow CRITICAL RESULT CALLED TO, READ BACK BY AND VERIFIED WITH: Alycia Patten [id number] MLM Performed at [facility], [place] [address], [place], [place] [address]    Culture ENTEROCOCCUS FAECALIS (A)  Final   Report Status 06/11/2020 FINAL  Final   Organism ID, Bacteria ENTEROCOCCUS FAECALIS  Final      Susceptibility   Enterococcus faecalis - MIC*    AMPICILLIN <=2 SENSITIVE Sensitive     VANCOMYCIN 1 SENSITIVE Sensitive     GENTAMICIN SYNERGY SENSITIVE Sensitive     * ENTEROCOCCUS FAECALIS  Blood Culture ID Panel (Reflexed)     Status: Abnormal   Collection Time: 06/08/20  4:26 PM  Result Value Ref Range Status    Enterococcus faecalis DETECTED (A) NOT DETECTED Final    Comment: CRITICAL RESULT CALLED TO, READ BACK BY AND VERIFIED WITH: PHARMD K PIERCE [id number] MLM    Enterococcus Faecium NOT DETECTED NOT DETECTED Final   Listeria monocytogenes NOT DETECTED NOT DETECTED Final   Staphylococcus  species NOT DETECTED NOT DETECTED Final   Staphylococcus aureus (BCID) NOT DETECTED NOT DETECTED Final   Staphylococcus epidermidis NOT DETECTED NOT DETECTED Final   Staphylococcus lugdunensis NOT DETECTED NOT DETECTED Final   Streptococcus species NOT DETECTED NOT DETECTED Final   Streptococcus agalactiae NOT DETECTED NOT DETECTED Final   Streptococcus pneumoniae NOT DETECTED NOT DETECTED Final   Streptococcus pyogenes NOT DETECTED NOT DETECTED Final   A.calcoaceticus-baumannii NOT DETECTED NOT DETECTED Final   Bacteroides fragilis NOT DETECTED NOT DETECTED Final   Enterobacterales NOT DETECTED NOT DETECTED Final   Enterobacter cloacae complex NOT DETECTED NOT DETECTED Final   Escherichia coli NOT DETECTED NOT DETECTED Final   Klebsiella aerogenes NOT DETECTED NOT DETECTED Final   Klebsiella oxytoca NOT DETECTED NOT DETECTED Final   Klebsiella pneumoniae NOT DETECTED NOT DETECTED Final   Proteus species NOT DETECTED NOT DETECTED Final   Salmonella species NOT DETECTED NOT DETECTED Final   Serratia marcescens NOT DETECTED NOT DETECTED Final   Haemophilus influenzae NOT DETECTED NOT DETECTED Final   Neisseria meningitidis NOT DETECTED NOT DETECTED Final   Pseudomonas aeruginosa NOT DETECTED NOT DETECTED Final   Stenotrophomonas maltophilia NOT DETECTED NOT DETECTED Final   Candida albicans NOT DETECTED NOT DETECTED Final   Candida auris NOT DETECTED NOT DETECTED Final   Candida glabrata NOT DETECTED NOT DETECTED Final   Candida krusei NOT DETECTED NOT DETECTED Final   Candida parapsilosis NOT DETECTED NOT DETECTED Final   Candida tropicalis NOT DETECTED NOT DETECTED Final   Cryptococcus  neoformans/gattii NOT DETECTED NOT DETECTED Final   Vancomycin resistance NOT DETECTED NOT DETECTED Final    Comment: Performed at [facility], [address] [place], [place] [address]  Resp Panel by RT-PCR (Flu A&B, Covid) Nasopharyngeal Swab     Status: None   Collection Time: 06/08/20  5:05 PM   Specimen: Nasopharyngeal Swab; Nasopharyngeal(NP) swabs in vial transport medium  Result Value Ref Range Status   SARS Coronavirus 2 by RT PCR NEGATIVE NEGATIVE Final    Comment: (NOTE) SARS-CoV-2 target nucleic acids are NOT DETECTED.  The SARS-CoV-2 RNA is generally detectable in upper respiratory specimens during the acute phase of infection. The lowest concentration of SARS-CoV-2 viral copies this assay can detect is 138 copies/mL. A negative result does not preclude SARS-Cov-2 infection and should not be used as the sole basis for treatment or other patient management decisions. A negative result may occur with  improper specimen collection/handling, submission of specimen other than nasopharyngeal swab, presence of viral mutation(s) within the areas targeted by this assay, and inadequate number of viral copies(<138 copies/mL). A negative result must be combined with clinical observations, patient history, and epidemiological information. The expected result is Negative.  Fact Sheet for Patients:  EntrepreneurPulse.com.au  Fact Sheet for Healthcare Providers:  IncredibleEmployment.be  This test is no t yet approved or cleared by the [place] FDA and  has been authorized for detection and/or diagnosis of SARS-CoV-2 by FDA under an Emergency Use Authorization (EUA). This EUA will remain  in effect (meaning this test can be used) for the duration of the COVID-19 declaration under Section 564(b)(1) of the Act, 21 U.S.C.section 360bbb-3(b)(1), unless the authorization is terminated  or revoked sooner.       Influenza A by PCR NEGATIVE  NEGATIVE Final   Influenza B by PCR NEGATIVE NEGATIVE Final    Comment: (NOTE) The Xpert Xpress SARS-CoV-2/FLU/RSV plus assay is intended as an aid in the diagnosis of influenza from Nasopharyngeal  swab specimens and should not be used as a sole basis for treatment. Nasal washings and aspirates are unacceptable for Xpert Xpress SARS-CoV-2/FLU/RSV testing.  Fact Sheet for Patients: EntrepreneurPulse.com.au  Fact Sheet for Healthcare Providers: IncredibleEmployment.be  This test is not yet approved or cleared by the [place] FDA and has been authorized for detection and/or diagnosis of SARS-CoV-2 by FDA under an Emergency Use Authorization (EUA). This EUA will remain in effect (meaning this test can be used) for the duration of the COVID-19 declaration under Section 564(b)(1) of the Act, 21 U.S.C. section 360bbb-3(b)(1), unless the authorization is terminated or revoked.  Performed at [facility], [address], [place], [place] [address]   Culture, body fluid-bottle     Status: None (Preliminary result)   Collection Time: 06/10/20  3:44 AM   Specimen: Peritoneal Dialysate  Result Value Ref Range Status   Specimen Description PERITONEAL DIALYSATE FLUID  Final   Special Requests   Final    BOTTLES DRAWN AEROBIC AND ANAEROBIC Blood Culture adequate volume   Culture   Final    NO GROWTH 2 DAYS Performed at [facility], [address] [place], [place] [address]    Report Status PENDING  Incomplete  Gram stain     Status: None   Collection Time: 06/10/20  3:44 AM   Specimen: Peritoneal Dialysate  Result Value Ref Range Status   Specimen Description PERITONEAL DIALYSATE FLUID  Final   Special Requests NONE  Final   Gram Stain   Final    WBC PRESENT,BOTH PMN AND MONONUCLEAR NO ORGANISMS SEEN CYTOSPIN SMEAR Performed at [facility], [address] [place], [place] [address]    Report Status 06/10/2020 FINAL  Final   Culture, blood (routine x 2)     Status: None (Preliminary result)   Collection Time: 06/10/20  5:17 AM   Specimen: BLOOD RIGHT HAND  Result Value Ref Range Status   Specimen Description BLOOD RIGHT HAND  Final   Special Requests   Final    BOTTLES DRAWN AEROBIC ONLY Blood Culture adequate volume   Culture   Final    NO GROWTH 2 DAYS Performed at [facility], [place] [address], [place], [place] [address]    Report Status PENDING  Incomplete  Culture, blood (routine x 2)     Status: None (Preliminary result)   Collection Time: 06/10/20  5:17 AM   Specimen: BLOOD RIGHT HAND  Result Value Ref Range Status   Specimen Description BLOOD RIGHT HAND  Final   Special Requests   Final    BOTTLES DRAWN AEROBIC ONLY Blood Culture results may not be optimal due to an inadequate volume of blood received in culture bottles   Culture   Final    NO GROWTH 2 DAYS Performed at [facility], [place] [address] [place], [place] [address]    Report Status PENDING  Incomplete         Radiology Studies: ECHO TEE  Result Date: 06/12/2020    TRANSESOPHOGEAL ECHO REPORT   Patient Name:   [name] Date of Exam: 06/12/2020 Medical Rec #:  [id number]     Height:       69.0 in Accession #:    [id number]    Weight:       283.7 lb Date of Birth:  26-Jul-1945     BSA:          2.397 m Patient Age:    44  years      BP:           128/60 mmHg Patient Gender: M             HR:           70 bpm. Exam Location:  Inpatient Procedure: Transesophageal Echo, Cardiac Doppler and Color Doppler Indications:     Enterococcal bacteremia  History:         Patient has prior history of Echocardiogram examinations, most                  recent 06/10/2020.                  Aortic Valve: bioprosthetic valve is present in the aortic                  position.  Sonographer:     Philipp Deputy Referring Phys:  [id number] Leanor Kail Diagnosing Phys: Skeet Latch MD PROCEDURE: After discussion of the risks and benefits  of a TEE, an informed consent was obtained from the patient. The transesophogeal probe was passed without difficulty through the esophogus of the patient. Sedation performed by different physician. Image quality was adequate. The patient's vital signs; including heart rate, blood pressure, and oxygen saturation; remained stable throughout the procedure. The patient developed no complications during the procedure. IMPRESSIONS  1. Left ventricular ejection fraction, by estimation, is 60 to 65%. The left ventricle has normal function. The left ventricle has no regional wall motion abnormalities.  2. Right ventricular systolic function is normal. The right ventricular size is normal.  3. Left atrial size was mildly dilated. No left atrial/left atrial appendage thrombus was detected.  4. Right atrial size was mildly dilated.  5. Multiple regurgitant jets. The mitral valve is degenerative. There is a mobile density on the anterior mitral valve leaflet that is poorly visualized but seems to have also been present on the TTE 07/07/2017. The mitral valve is degenerative. Mild mitral valve regurgitation. Mild to moderate mitral stenosis. Moderate to severe mitral annular calcification.  6. Tricuspid valve regurgitation is moderate to severe.  7. 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 Aortic valve gradients are moderately elevated, unchanged from 2019. There is a fixed, calcified density in the LVOT unchanged from 2019. The aortic valve has been repaired/replaced. Aortic valve regurgitation is not visualized. Moderate aortic valve stenosis. 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 Aortic valve mean gradient measures 28.0 mmHg. Aortic valve Vmax measures 3.52 m/s. Conclusion(s)/Recommendation(s): No evidence of vegetation/infective endocarditis on this transesophageal echocardiogram. FINDINGS  Left Ventricle: Left ventricular ejection fraction, by estimation, is 60 to 65%. The left  ventricle has normal function. The left ventricle has no regional wall motion abnormalities. The left ventricular internal cavity size was normal in size. There is  no left ventricular hypertrophy. Right Ventricle: The right ventricular size is normal. No increase in right ventricular wall thickness. Right ventricular systolic function is normal. Left Atrium: Left atrial size was mildly dilated. No left atrial/left atrial appendage thrombus was detected. Right Atrium: Right atrial size was mildly dilated. Pericardium: There is no evidence of pericardial effusion. Mitral Valve: Multiple regurgitant jets. The mitral valve is degenerative. There is a mobile density on the anterior mitral valve leaflet that is poorly visualized but seems to have also been present on the TTE 07/07/2017. The mitral valve is degenerative in appearance. Moderate to severe mitral annular calcification. Mild mitral valve regurgitation. Mild to  moderate mitral valve stenosis. MV peak gradient, 21.2 mmHg. The mean mitral valve gradient is 8.0 mmHg. Tricuspid Valve: The tricuspid valve is normal in structure. Tricuspid valve regurgitation is moderate to severe. No evidence of tricuspid stenosis. Aortic Valve: Bioprosthetic aortic valve leaftlets are thin and moving well. Aortic valve gradients are moderately elevated, unchanged from 2019. There is a fixed, calcified density in the LVOT unchanged from 2019. The aortic valve has been repaired/replaced. Aortic valve regurgitation is not visualized. Moderate aortic stenosis is present. Aortic valve mean gradient measures 28.0 mmHg. Aortic valve peak gradient measures 49.6 mmHg. 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 Pulmonic Valve: The pulmonic valve was normal in structure. Pulmonic valve regurgitation is not visualized. No evidence of pulmonic stenosis. Aorta: The aortic root is normal in size and structure. IAS/Shunts: No atrial level shunt detected by color flow Doppler.   LEFT VENTRICLE PLAX 2D LVOT diam:     2.20 cm LVOT Area:     3.80 cm  AORTIC VALVE AV Vmax:      352.00 cm/s AV Vmean:     250.000 cm/s AV VTI:       0.845 m AV Peak Grad: 49.6 mmHg AV Mean Grad: 28.0 mmHg MITRAL VALVE MV Peak grad: 21.2 mmHg      SHUNTS MV Mean grad: 8.0 mmHg       Systemic Diam: 2.20 cm MV Vmax:      2.30 m/s MV Vmean:     136.0 cm/s MR Peak grad:    107.3 mmHg MR Mean grad:    73.0 mmHg MR Vmax:         518.00 cm/s MR Vmean:        400.0 cm/s MR PISA:         0.57 cm MR PISA Eff ROA: 4 mm MR PISA Radius:  0.30 cm Skeet Latch MD Electronically signed by Skeet Latch MD Signature Date/Time: 06/12/2020/3:56:32 PM    Final         Scheduled Meds: . aspirin  325 mg Oral Daily  . calcitRIOL  0.25 mcg Oral QODAY  . cholecalciferol  2,000 Units Oral QHS  . cholecalciferol  4,000 Units Oral Daily  . docusate sodium  100 mg Oral BID  . finasteride  5 mg Oral Daily  . fluticasone  1 spray Each Nare Daily  . furosemide  40 mg Intravenous BID  . gentamicin cream   Topical Daily  . latanoprost  1 drop Both Eyes QHS  . levothyroxine  175 mcg Oral Q0600  . mouth rinse  15 mL Mouth Rinse BID  . methylPREDNISolone (SOLU-MEDROL) injection  40 mg Intravenous Q24H  . metoprolol tartrate  25 mg Oral BID  . multivitamin  1 tablet Oral Daily  . omega-3 acid ethyl esters  1 g Oral BID  . pantoprazole  40 mg Oral Daily  . polyethylene glycol  17 g Oral Daily  . senna-docusate  1 tablet Oral BID  . sertraline  100 mg Oral Daily  . simvastatin  40 mg Oral QPM  . sodium bicarbonate  1,300 mg Oral TID WC  . sodium chloride flush  3 mL Intravenous Q12H   Continuous Infusions: . ampicillin (OMNIPEN) IV 2 g (06/12/20 0501)  . cefTRIAXone (ROCEPHIN)  IV 2 g (06/12/20 1124)  . dialysis solution 2.5% low-MG/low-CA    . heparin 1,650 Units/hr (06/12/20 1439)     LOS: 4 days    Time spent: 35 minutes  Elmarie Shiley, MD [facility]   If 7PM-7AM, please contact  night-coverage [email or web address]  06/12/2020, 5:07 PM

## 2020-06-12 NOTE — Anesthesia Postprocedure Evaluation (Signed)
Anesthesia Post Note  Patient: [name]  Procedure(s) Performed: TRANSESOPHAGEAL ECHOCARDIOGRAM (TEE) (N/A )     Patient location during evaluation: Endoscopy Anesthesia Type: MAC Level of consciousness: awake and alert Pain management: pain level controlled Vital Signs Assessment: post-procedure vital signs reviewed and stable Respiratory status: spontaneous breathing, nonlabored ventilation and respiratory function stable Cardiovascular status: stable and blood pressure returned to baseline Postop Assessment: no apparent nausea or vomiting Anesthetic complications: no   No complications documented.  Last Vitals:  Vitals:   06/12/20 1445 06/12/20 1455  BP: (!) 115/51 (!) 129/58  Pulse: 70 70  Resp: 15 10  Temp:    SpO2: 98% 100%    Last Pain:  Vitals:   06/12/20 1455  TempSrc:   PainSc: 0-No pain                 Catalina Gravel

## 2020-06-12 NOTE — Transfer of Care (Signed)
Immediate Anesthesia Transfer of Care Note  Patient: [name]  Procedure(s) Performed: TRANSESOPHAGEAL ECHOCARDIOGRAM (TEE) (N/A )  Patient Location: Endoscopy Unit  Anesthesia Type:MAC  Level of Consciousness: drowsy  Airway & Oxygen Therapy: Patient Spontanous Breathing and Patient connected to nasal cannula oxygen  Post-op Assessment: Report given to RN and Post -op Vital signs reviewed and stable  Post vital signs: Reviewed and stable  Last Vitals:  Vitals Value Taken Time  BP    Temp    Pulse 72 06/12/20 1435  Resp 12 06/12/20 1435  SpO2 100 % 06/12/20 1435  Vitals shown include unvalidated device data.  Last Pain:  Vitals:   06/12/20 1227  TempSrc: Oral  PainSc: 0-No pain         Complications: No complications documented.

## 2020-06-13 ENCOUNTER — Inpatient Hospital Stay (HOSPITAL_COMMUNITY): Payer: Managed Care, Other (non HMO)

## 2020-06-13 DIAGNOSIS — I2583 Coronary atherosclerosis due to lipid rich plaque: Secondary | ICD-10-CM

## 2020-06-13 DIAGNOSIS — I110 Hypertensive heart disease with heart failure: Secondary | ICD-10-CM

## 2020-06-13 DIAGNOSIS — I330 Acute and subacute infective endocarditis: Secondary | ICD-10-CM

## 2020-06-13 DIAGNOSIS — R7881 Bacteremia: Secondary | ICD-10-CM

## 2020-06-13 DIAGNOSIS — I38 Endocarditis, valve unspecified: Secondary | ICD-10-CM

## 2020-06-13 DIAGNOSIS — B952 Enterococcus as the cause of diseases classified elsewhere: Secondary | ICD-10-CM

## 2020-06-13 DIAGNOSIS — I5043 Acute on chronic combined systolic (congestive) and diastolic (congestive) heart failure: Secondary | ICD-10-CM

## 2020-06-13 HISTORY — PX: IR PERC TUN PERIT CATH WO PORT S&I /IMAG: IMG2327

## 2020-06-13 HISTORY — PX: IR US GUIDE VASC ACCESS RIGHT: IMG2390

## 2020-06-13 LAB — CBC
HCT: 26.9 % — ABNORMAL LOW (ref 39.0–52.0)
Hemoglobin: 8.4 g/dL — ABNORMAL LOW (ref 13.0–17.0)
MCH: 30.8 pg (ref 26.0–34.0)
MCHC: 31.2 g/dL (ref 30.0–36.0)
MCV: 98.5 fL (ref 80.0–100.0)
Platelets: 223 10*3/uL (ref 150–400)
RBC: 2.73 MIL/uL — ABNORMAL LOW (ref 4.22–5.81)
RDW: 14.6 % (ref 11.5–15.5)
WBC: 17 10*3/uL — ABNORMAL HIGH (ref 4.0–10.5)
nRBC: 1.7 % — ABNORMAL HIGH (ref 0.0–0.2)

## 2020-06-13 LAB — RENAL FUNCTION PANEL
Albumin: 2.5 g/dL — ABNORMAL LOW (ref 3.5–5.0)
Anion gap: 17 — ABNORMAL HIGH (ref 5–15)
BUN: 119 mg/dL — ABNORMAL HIGH (ref 8–23)
CO2: 20 mmol/L — ABNORMAL LOW (ref 22–32)
Calcium: 8.6 mg/dL — ABNORMAL LOW (ref 8.9–10.3)
Chloride: 103 mmol/L (ref 98–111)
Creatinine, Ser: 7.39 mg/dL — ABNORMAL HIGH (ref 0.61–1.24)
GFR, Estimated: 7 mL/min — ABNORMAL LOW (ref >60)
Glucose, Bld: 120 mg/dL — ABNORMAL HIGH (ref 70–99)
Phosphorus: 7.9 mg/dL — ABNORMAL HIGH (ref 2.5–4.6)
Potassium: 3.8 mmol/L (ref 3.5–5.1)
Sodium: 140 mmol/L (ref 135–145)

## 2020-06-13 LAB — HEPARIN LEVEL (UNFRACTIONATED): Heparin Unfractionated: 0.53 IU/mL (ref 0.30–0.70)

## 2020-06-13 MED ORDER — CHLORHEXIDINE GLUCONATE CLOTH 2 % EX PADS
6.0000 | MEDICATED_PAD | Freq: Every day | CUTANEOUS | Status: DC
  Administered 2020-06-13 – 2020-06-14 (×2): 6 via TOPICAL

## 2020-06-13 MED ORDER — LIDOCAINE HCL (PF) 1 % IJ SOLN
INTRAMUSCULAR | Status: DC | PRN
  Administered 2020-06-13: 5 mL

## 2020-06-13 MED ORDER — APIXABAN 5 MG PO TABS
10.0000 mg | ORAL_TABLET | Freq: Two times a day (BID) | ORAL | Status: DC
  Administered 2020-06-13 – 2020-06-14 (×2): 10 mg via ORAL
  Filled 2020-06-13 (×3): qty 2

## 2020-06-13 MED ORDER — DELFLEX-LC/4.25% DEXTROSE 483 MOSM/L IP SOLN: INTRAPERITONEAL | Status: DC

## 2020-06-13 MED ORDER — LIDOCAINE HCL 1 % IJ SOLN
INTRAMUSCULAR | Status: AC
  Filled 2020-06-13: qty 20

## 2020-06-13 MED ORDER — APIXABAN 5 MG PO TABS: 5.0000 mg | ORAL_TABLET | Freq: Two times a day (BID) | ORAL | Status: DC

## 2020-06-13 NOTE — Progress Notes (Signed)
ANTICOAGULATION CONSULT NOTE - Follow Up Consult  Pharmacy Consult for Heparin Indication: pulmonary embolus  Recent Labs    06/11/20 0125 06/11/20 1854 06/12/20 0442 06/13/20 0326  HGB 7.8*  --  8.2* 8.4*  HCT 24.8*  --  26.5* 26.9*  PLT 230  --  229 223  HEPARINUNFRC  --  0.66 0.75* 0.53  CREATININE 6.85*  --  7.24* 7.39*    Estimated Creatinine Clearance: 11.4 mL/min (A) (by C-G formula based on SCr of 7.39 mg/dL (H)).  Assessment: 74 yr old male with ESRD on PD with recent AVF placement. 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 VQ scan showed "bilateral scattered segmental to subsegmental perfusion defects right more than left worrisome for pulmonary emboli". Pharmacy was consulted for heparin dosing; pt was on no anticoagulants PTA. Patient had been receiving heparin SQ for VTE px (last dose at ~0520 AM on 12/14).   TEE negative for endocarditis, ID following  12/15  Heparin level 0.75 units/ml 12/16  Heparin level therapeutic at 0.53 with heparin infusing at 1650 units/hr, Hgb stable, PLT stable, no bleeding noted.   Goal of Therapy:  Heparin level 0.3-0.7 units/ml Monitor platelets by anticoagulation protocol: Yes   Plan:  Continue heparin infusion at 1650 units/hr Monitor daily heparin level, CBC Monitor for signs/symptoms of bleeding F/U transition to oral anticoagulant when able  Thanks for allowing pharmacy to be a part of this patient's care.  Lenka Zhao P. Legrand Como, PharmD, [place] Please utilize [facility] for appropriate phone number to reach the unit pharmacist ([place]) 06/13/2020 8:48 AM

## 2020-06-13 NOTE — Progress Notes (Signed)
ANTICOAGULATION CONSULT NOTE - Follow Up Consult  Pharmacy Consult for Heparin Indication: pulmonary embolus  Recent Labs    06/11/20 0125 06/11/20 1854 06/12/20 0442 06/13/20 0326  HGB 7.8*  --  8.2* 8.4*  HCT 24.8*  --  26.5* 26.9*  PLT 230  --  229 223  HEPARINUNFRC  --  0.66 0.75* 0.53  CREATININE 6.85*  --  7.24* 7.39*    Estimated Creatinine Clearance: 11.4 mL/min (A) (by C-G formula based on SCr of 7.39 mg/dL (H)).  Assessment: 74 yr old male with ESRD on PD with recent AVF placement. 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 VQ scan showed "bilateral scattered segmental to subsegmental perfusion defects right more than left worrisome for pulmonary emboli". Pharmacy was consulted for heparin dosing; pt was on no anticoagulants PTA. Patient had been receiving heparin SQ for VTE px (last dose at ~0520 AM on 12/14).   TEE negative for endocarditis, ID following  12/15  Heparin level 0.75 units/ml 12/16  Heparin level therapeutic at 0.53 with heparin infusing at 1650 units/hr, Hgb stable, PLT stable, no bleeding noted.  Addendum:  Pt is s/p IJ for outpatient abx. Plan is to transition to PO apixaban today for discharge. He has gotten 2 days of IV anticoagulation. We will continue with 5d load of apixaban to complete the 7d.   Goal of Therapy:  Monitor platelets by anticoagulation protocol: Yes   Plan:  Dc heparin Apixaban 10mg  BID x5d then 5mg  PO BID   [name], PharmD, BCIDP, AAHIVP, CPP Infectious Disease Pharmacist 06/13/2020 3:09 PM

## 2020-06-13 NOTE — Progress Notes (Signed)
Pt got done with Peritoneal dialysis at 0045am. RN called operator at 0100am  And was informed that the dialysis nurse on call was sick. RN asked to be forwarded to the dialysis office and there was no answer. At 0115am  RN notified charge nurse of incident. Charge nurse informed RN that dialysis nurse comes at [place]. At [phone] RN called dialysis office and was told that a nurse would be up to the floor shortly. RN notified day shift nurse of pt not getting taken off of PD and asked for nurse to follow up.

## 2020-06-13 NOTE — Progress Notes (Addendum)
PROGRESS NOTE    [name]  FYB:[id number] DOB: 1946-03-23 DOA: 06/08/2020 PCP: Celene Squibb, MD   Brief Narrative: 74 year old with past medical history significant for COPD, CAD, hypertension, depression, history of aortic valve replacement, hypothyroidism, hyperlipidemia, obesity, MGUS, CKD status post peritoneal dialysis catheter on 05/14/2020 and left aVF inserted by Dr. Dixie Dials 52/77/8242 at [facility] who had 5 section of training peritoneal dialysis last week presented to the hospital with fevers, chills and progressive dyspnea with worsening lower extremity edema with cough and wheezing.  In the ED patient was noted to be hypoxic oxygen saturation in the mid 80s on room air, tolerating 2 L of oxygen.  Chest x-ray was significant for diffuse multifocal opacity related to volume overload, versus infectious process.  Patient was febrile temperature 101, leukocytosis, procalcitonin at 1.2, CRP 16, D-dimer 2.6, hemoglobin 8.8.  Patient was a started on broad-spectrum antibiotics was admitted for further evaluation.  Patient was found to have enterococcal bacteremia, underwent TEE that was negative for endocarditis.  ID currently following.     Assessment & Plan:   Principal Problem:   Enterococcal bacteremia Active Problems:   CAD (coronary artery disease)   Depression   Essential (primary) hypertension   Chronic obstructive pulmonary disease, unspecified (HCC)   Hyperlipidemia, unspecified   Hypothyroidism, unspecified   Atherosclerotic heart disease of native coronary artery without angina pectoris   Morbid (severe) obesity due to excess calories (HCC)   Fever   Severe sepsis without septic shock (CODE) (HCC)   S/P AVR (aortic valve replacement)   Acute on chronic congestive heart failure (HCC)   Demand ischemia (HCC)   Elevated d-dimer  1-Acute hypoxic respiratory failure:  -Patient presented with oxygen saturation in the 80s requiring 2 L of  oxygen. -Could be multifactorial secondary to volume overload, acute on chronic diastolic heart failure, COPD and possible pneumonia, Vs PE -VQ- scan from 12/13 show bilateral scattered segmental to subsegmental perfusion defects worrisome for pulmonary embolism. -Patient was a started on heparin drip. Plan to transition to Eliquis when no further procedure are planned. hopefully after central line is placed.  Continue with IV Laxis. Had PD on 12/15.   2-Enterococcal bacteremia: Patient presented with fever.  Peritoneal fluid Gram stain without organisms. 2D echo negative.  TEE negative. Continue with Rocephin and ampicillin. Plan to treat him for 6 weeks of IV antibiotics due to concern for right side endocarditis due to bilateral pulmonary embolic infiltrates.  IR consulted for central line.  Blood culture 12-13 no growth to date.  3-COPD exacerbation: Received IV Solu-Medrol.  Plan to transition to oral prednisone today.  Plan to do short taper.  Lungs sounds better today.   4-Possible multifocal pneumonia: Continue with IV antibiotics. Wean off oxygen as possible. Check oxygen on ambulation.   5-ESRD with evidence of significant volume overload, non-anion gap metabolic acidosis: Patient was being trained for peritoneal dialysis at home. Plan to proceed with peritoneal dialysis today. Nephrology following. Left AV fistula inserted by Dr. Dixie Dials 35/08/6142. Peritoneal hemodialysis inserted by general surgery at [facility] in [place] on 7/16. Continue with sodium bicarb tablet  Anemia of chronic kidney disease: Hb stable.   Hypertension: Continue with metoprolol. Continue to hold Norvasc and hydralazine. CAD: Continue with aspirin and statin and beta-blockers. Hypothyroidism: Continue with Synthroid BPH: Continue with Proscar Depression: Continue with sertraline Hyperlipidemia: Continue with simvastatin.    Estimated body mass index is 41.67 kg/m as  calculated from the following:  Height as of this encounter: 5\' 9"  (1.753 m).   Weight as of this encounter: 128 kg.   DVT prophylaxis: Heparin  drip Code Status: Full code Family Communication: Wife who was at bedside Disposition Plan:  Status is: Inpatient  Remains inpatient appropriate because:Ongoing diagnostic testing needed not appropriate for outpatient work up   Dispo: The patient is from: Home              Anticipated d/c is to: Home              Anticipated d/c date is: 1 day              Patient currently is not medically stable to d/c.Home 12/017        Consultants:   ID Cariology  Nephrology    Procedures:   none  Antimicrobials:    Subjective: He is feeling better, breathing have improved.     Objective: Vitals:   06/12/20 2154 06/12/20 2305 06/13/20 0544 06/13/20 1006  BP: 120/61  136/77 (!) 145/75  Pulse: 71 70 68 68  Resp: 16 16 18 18   Temp: 97.6 F (36.4 C)  97.8 F (36.6 C) 97.7 F (36.5 C)  TempSrc: Oral  Oral Oral  SpO2: 96%  98% 99%  Weight: 128 kg     Height:        Intake/Output Summary (Last 24 hours) at 06/13/2020 1329 Last data filed at 06/13/2020 1000 Gross per 24 hour  Intake 996.8 ml  Output 870 ml  Net 126.8 ml   Filed Weights   06/12/20 0528 06/12/20 1840 06/12/20 2154  Weight: 128.7 kg 128.7 kg 128 kg    Examination:  General exam: NAD Respiratory system:  Less wheezing on lung exam.  Cardiovascular system: S 1, S 2 RRR Gastrointestinal system: BS present, soft. , nt, PD catheter in place Central nervous system: alert Extremities: plus 2 edema    Data Reviewed: I have personally reviewed following labs and imaging studies  CBC: Recent Labs  Lab 06/08/20 1332 06/09/20 0415 06/10/20 0517 06/11/20 0125 06/12/20 0442 06/13/20 0326  WBC 22.3* 18.5* 18.5* 19.1* 19.9* 17.0*  NEUTROABS 20.2*  --  17.4*  --   --   --   HGB 8.8* 8.0* 7.8* 7.8* 8.2* 8.4*  HCT 27.1* 25.4* 23.9* 24.8* 26.5* 26.9*   MCV 98.2 97.7 96.4 98.0 99.3 98.5  PLT 224 192 213 230 229 401   Basic Metabolic Panel: Recent Labs  Lab 06/09/20 1012 06/10/20 0517 06/11/20 0125 06/12/20 0442 06/13/20 0326  NA 138 139 139 140 140  K 3.5 3.4* 4.1 4.2 3.8  CL 104 104 104 104 103  CO2 17* 17* 19* 20* 20*  GLUCOSE 190* 189* 182* 139* 120*  BUN 79* 90* 105* 119* 119*  CREATININE 6.06* 6.52* 6.85* 7.24* 7.39*  CALCIUM 8.6* 8.7* 8.7* 8.6* 8.6*  MG  --   --  2.2 2.1  --   PHOS  --   --  6.1* 7.7* 7.9*   GFR: Estimated Creatinine Clearance: 11.4 mL/min (A) (by C-G formula based on SCr of 7.39 mg/dL (H)). Liver Function Tests: Recent Labs  Lab 06/08/20 1621 06/09/20 1012 06/11/20 0125 06/12/20 0442 06/13/20 0326  AST 24 27 36  --   --   ALT 14 13 20   --   --   ALKPHOS 56 49 48  --   --   BILITOT 0.6 0.6 0.5  --   --   PROT 6.9 6.1*  6.1*  --   --   ALBUMIN 3.1* 2.4* 2.5* 2.5* 2.5*   No results for input(s): LIPASE, AMYLASE in the last 168 hours. No results for input(s): AMMONIA in the last 168 hours. Coagulation Profile: No results for input(s): INR, PROTIME in the last 168 hours. Cardiac Enzymes: No results for input(s): CKTOTAL, CKMB, CKMBINDEX, TROPONINI in the last 168 hours. BNP (last 3 results) No results for input(s): PROBNP in the last 8760 hours. HbA1C: No results for input(s): HGBA1C in the last 72 hours. CBG: No results for input(s): GLUCAP in the last 168 hours. Lipid Profile: No results for input(s): CHOL, HDL, LDLCALC, TRIG, CHOLHDL, LDLDIRECT in the last 72 hours. Thyroid Function Tests: No results for input(s): TSH, T4TOTAL, FREET4, T3FREE, THYROIDAB in the last 72 hours. Anemia Panel: No results for input(s): VITAMINB12, FOLATE, FERRITIN, TIBC, IRON, RETICCTPCT in the last 72 hours. Sepsis Labs: Recent Labs  Lab 06/08/20 1621 06/08/20 1942  PROCALCITON 1.26  --   LATICACIDVEN 1.1 0.8    Recent Results (from the past 240 hour(s))  Blood Culture (routine x 2)     Status:  Abnormal   Collection Time: 06/08/20  4:21 PM   Specimen: BLOOD  Result Value Ref Range Status   Specimen Description   Final    BLOOD Performed at [facility], [address] [place], [place] [address]    Special Requests   Final    NONE Performed at [facility], [address], [place], [place] [address]    Culture  Setup Time   Final    GRAM POSITIVE COCCI Gram Stain Report Called to,Read Back By and Verified With: HEATHER CRAWFORD,RN @0741  06/09/2020 KAY IN BOTH AEROBIC AND ANAEROBIC BOTTLES    Culture (A)  Final    ENTEROCOCCUS FAECALIS SUSCEPTIBILITIES PERFORMED ON PREVIOUS CULTURE WITHIN THE LAST 5 DAYS. Performed at [facility], [place] [address] [place], [place] [address]    Report Status 06/11/2020 FINAL  Final  Blood Culture (routine x 2)     Status: Abnormal   Collection Time: 06/08/20  4:26 PM   Specimen: BLOOD  Result Value Ref Range Status   Specimen Description   Final    BLOOD Performed at [facility], [address], [place], [place] [address]    Special Requests   Final    NONE Performed at [facility], [address] [place], [place] [address]    Culture  Setup Time   Final    GRAM POSITIVE COCCI ANAEROBIC BOTTLE ONLY Gram Stain Report Called to,Read Back By and Verified With: HEATHER CRAWFORD,RN @0741  06/09/2020 KAY GRAM POSITIVE COCCI AEROBIC BOTTLE ONLY Organism ID to follow CRITICAL RESULT CALLED TO, READ BACK BY AND VERIFIED WITH: Alycia Patten [id number] MLM Performed at [facility], [place] [address] [place], [place] [address]    Culture ENTEROCOCCUS FAECALIS (A)  Final   Report Status 06/11/2020 FINAL  Final   Organism ID, Bacteria ENTEROCOCCUS FAECALIS  Final      Susceptibility   Enterococcus faecalis - MIC*    AMPICILLIN <=2 SENSITIVE Sensitive     VANCOMYCIN 1 SENSITIVE Sensitive     GENTAMICIN SYNERGY SENSITIVE Sensitive     * ENTEROCOCCUS FAECALIS  Blood Culture ID Panel (Reflexed)     Status: Abnormal   Collection  Time: 06/08/20  4:26 PM  Result Value Ref Range Status   Enterococcus faecalis DETECTED (A) NOT DETECTED Final    Comment: CRITICAL RESULT CALLED TO, READ  BACK BY AND VERIFIED WITH: PHARMD K PIERCE [id number] MLM    Enterococcus Faecium NOT DETECTED NOT DETECTED Final   Listeria monocytogenes NOT DETECTED NOT DETECTED Final   Staphylococcus species NOT DETECTED NOT DETECTED Final   Staphylococcus aureus (BCID) NOT DETECTED NOT DETECTED Final   Staphylococcus epidermidis NOT DETECTED NOT DETECTED Final   Staphylococcus lugdunensis NOT DETECTED NOT DETECTED Final   Streptococcus species NOT DETECTED NOT DETECTED Final   Streptococcus agalactiae NOT DETECTED NOT DETECTED Final   Streptococcus pneumoniae NOT DETECTED NOT DETECTED Final   Streptococcus pyogenes NOT DETECTED NOT DETECTED Final   A.calcoaceticus-baumannii NOT DETECTED NOT DETECTED Final   Bacteroides fragilis NOT DETECTED NOT DETECTED Final   Enterobacterales NOT DETECTED NOT DETECTED Final   Enterobacter cloacae complex NOT DETECTED NOT DETECTED Final   Escherichia coli NOT DETECTED NOT DETECTED Final   Klebsiella aerogenes NOT DETECTED NOT DETECTED Final   Klebsiella oxytoca NOT DETECTED NOT DETECTED Final   Klebsiella pneumoniae NOT DETECTED NOT DETECTED Final   Proteus species NOT DETECTED NOT DETECTED Final   Salmonella species NOT DETECTED NOT DETECTED Final   Serratia marcescens NOT DETECTED NOT DETECTED Final   Haemophilus influenzae NOT DETECTED NOT DETECTED Final   Neisseria meningitidis NOT DETECTED NOT DETECTED Final   Pseudomonas aeruginosa NOT DETECTED NOT DETECTED Final   Stenotrophomonas maltophilia NOT DETECTED NOT DETECTED Final   Candida albicans NOT DETECTED NOT DETECTED Final   Candida auris NOT DETECTED NOT DETECTED Final   Candida glabrata NOT DETECTED NOT DETECTED Final   Candida krusei NOT DETECTED NOT DETECTED Final   Candida parapsilosis NOT DETECTED NOT DETECTED Final   Candida tropicalis NOT  DETECTED NOT DETECTED Final   Cryptococcus neoformans/gattii NOT DETECTED NOT DETECTED Final   Vancomycin resistance NOT DETECTED NOT DETECTED Final    Comment: Performed at [facility], [address] [place], [place] [address]  Resp Panel by RT-PCR (Flu A&B, Covid) Nasopharyngeal Swab     Status: None   Collection Time: 06/08/20  5:05 PM   Specimen: Nasopharyngeal Swab; Nasopharyngeal(NP) swabs in vial transport medium  Result Value Ref Range Status   SARS Coronavirus 2 by RT PCR NEGATIVE NEGATIVE Final    Comment: (NOTE) SARS-CoV-2 target nucleic acids are NOT DETECTED.  The SARS-CoV-2 RNA is generally detectable in upper respiratory specimens during the acute phase of infection. The lowest concentration of SARS-CoV-2 viral copies this assay can detect is 138 copies/mL. A negative result does not preclude SARS-Cov-2 infection and should not be used as the sole basis for treatment or other patient management decisions. A negative result may occur with  improper specimen collection/handling, submission of specimen other than nasopharyngeal swab, presence of viral mutation(s) within the areas targeted by this assay, and inadequate number of viral copies(<138 copies/mL). A negative result must be combined with clinical observations, patient history, and epidemiological information. The expected result is Negative.  Fact Sheet for Patients:  EntrepreneurPulse.com.au  Fact Sheet for Healthcare Providers:  IncredibleEmployment.be  This test is no t yet approved or cleared by the [place] FDA and  has been authorized for detection and/or diagnosis of SARS-CoV-2 by FDA under an Emergency Use Authorization (EUA). This EUA will remain  in effect (meaning this test can be used) for the duration of the COVID-19 declaration under Section 564(b)(1) of the Act, 21 U.S.C.section 360bbb-3(b)(1), unless the authorization is terminated  or revoked  sooner.       Influenza A by PCR NEGATIVE NEGATIVE  Final   Influenza B by PCR NEGATIVE NEGATIVE Final    Comment: (NOTE) The Xpert Xpress SARS-CoV-2/FLU/RSV plus assay is intended as an aid in the diagnosis of influenza from Nasopharyngeal swab specimens and should not be used as a sole basis for treatment. Nasal washings and aspirates are unacceptable for Xpert Xpress SARS-CoV-2/FLU/RSV testing.  Fact Sheet for Patients: EntrepreneurPulse.com.au  Fact Sheet for Healthcare Providers: IncredibleEmployment.be  This test is not yet approved or cleared by the [place] FDA and has been authorized for detection and/or diagnosis of SARS-CoV-2 by FDA under an Emergency Use Authorization (EUA). This EUA will remain in effect (meaning this test can be used) for the duration of the COVID-19 declaration under Section 564(b)(1) of the Act, 21 U.S.C. section 360bbb-3(b)(1), unless the authorization is terminated or revoked.  Performed at [facility] [place], [address] [place], [place] [address]   Culture, body fluid-bottle     Status: None (Preliminary result)   Collection Time: 06/10/20  3:44 AM   Specimen: Peritoneal Dialysate  Result Value Ref Range Status   Specimen Description PERITONEAL DIALYSATE FLUID  Final   Special Requests   Final    BOTTLES DRAWN AEROBIC AND ANAEROBIC Blood Culture adequate volume   Culture   Final    NO GROWTH 2 DAYS Performed at [facility], [address], [place], [place] [address]    Report Status PENDING  Incomplete  Gram stain     Status: None   Collection Time: 06/10/20  3:44 AM   Specimen: Peritoneal Dialysate  Result Value Ref Range Status   Specimen Description PERITONEAL DIALYSATE FLUID  Final   Special Requests NONE  Final   Gram Stain   Final    WBC PRESENT,BOTH PMN AND MONONUCLEAR NO ORGANISMS SEEN CYTOSPIN SMEAR Performed at [facility], [address], [place], [place] [address]     Report Status 06/10/2020 FINAL  Final  Culture, blood (routine x 2)     Status: None (Preliminary result)   Collection Time: 06/10/20  5:17 AM   Specimen: BLOOD RIGHT HAND  Result Value Ref Range Status   Specimen Description BLOOD RIGHT HAND  Final   Special Requests   Final    BOTTLES DRAWN AEROBIC ONLY Blood Culture adequate volume   Culture   Final    NO GROWTH 2 DAYS Performed at [facility], [place] [address], [place], [place] [address]    Report Status PENDING  Incomplete  Culture, blood (routine x 2)     Status: None (Preliminary result)   Collection Time: 06/10/20  5:17 AM   Specimen: BLOOD RIGHT HAND  Result Value Ref Range Status   Specimen Description BLOOD RIGHT HAND  Final   Special Requests   Final    BOTTLES DRAWN AEROBIC ONLY Blood Culture results may not be optimal due to an inadequate volume of blood received in culture bottles   Culture   Final    NO GROWTH 2 DAYS Performed at [facility], [place] [address], [place], [place] [address]    Report Status PENDING  Incomplete         Radiology Studies: ECHO TEE  Result Date: 06/12/2020    TRANSESOPHOGEAL ECHO REPORT   Patient Name:   [name] Date of Exam: 06/12/2020 Medical Rec #:  [id number]     Height:       69.0 in Accession #:    [id number]    Weight:  283.7 lb Date of Birth:  July 13, 1945     BSA:          2.397 m Patient Age:    22 years      BP:           128/60 mmHg Patient Gender: M             HR:           70 bpm. Exam Location:  Inpatient Procedure: Transesophageal Echo, Cardiac Doppler and Color Doppler Indications:     Enterococcal bacteremia  History:         Patient has prior history of Echocardiogram examinations, most                  recent 06/10/2020.                  Aortic Valve: bioprosthetic valve is present in the aortic                  position.  Sonographer:     Philipp Deputy Referring Phys:  [id number] Leanor Kail Diagnosing Phys: Skeet Latch MD PROCEDURE:  After discussion of the risks and benefits of a TEE, an informed consent was obtained from the patient. The transesophogeal probe was passed without difficulty through the esophogus of the patient. Sedation performed by different physician. Image quality was adequate. The patient's vital signs; including heart rate, blood pressure, and oxygen saturation; remained stable throughout the procedure. The patient developed no complications during the procedure. IMPRESSIONS  1. Left ventricular ejection fraction, by estimation, is 60 to 65%. The left ventricle has normal function. The left ventricle has no regional wall motion abnormalities.  2. Right ventricular systolic function is normal. The right ventricular size is normal.  3. Left atrial size was mildly dilated. No left atrial/left atrial appendage thrombus was detected.  4. Right atrial size was mildly dilated.  5. Multiple regurgitant jets. The mitral valve is degenerative. There is a mobile density on the anterior mitral valve leaflet that is poorly visualized but seems to have also been present on the TTE 07/07/2017. The mitral valve is degenerative. Mild mitral valve regurgitation. Mild to moderate mitral stenosis. Moderate to severe mitral annular calcification.  6. Tricuspid valve regurgitation is moderate to severe.  7. 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 Aortic valve gradients are moderately elevated, unchanged from 2019. There is a fixed, calcified density in the LVOT unchanged from 2019. The aortic valve has been repaired/replaced. Aortic valve regurgitation is not visualized. Moderate aortic valve stenosis. 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 Aortic valve mean gradient measures 28.0 mmHg. Aortic valve Vmax measures 3.52 m/s. Conclusion(s)/Recommendation(s): No evidence of vegetation/infective endocarditis on this transesophageal echocardiogram. FINDINGS  Left Ventricle: Left ventricular ejection  fraction, by estimation, is 60 to 65%. The left ventricle has normal function. The left ventricle has no regional wall motion abnormalities. The left ventricular internal cavity size was normal in size. There is  no left ventricular hypertrophy. Right Ventricle: The right ventricular size is normal. No increase in right ventricular wall thickness. Right ventricular systolic function is normal. Left Atrium: Left atrial size was mildly dilated. No left atrial/left atrial appendage thrombus was detected. Right Atrium: Right atrial size was mildly dilated. Pericardium: There is no evidence of pericardial effusion. Mitral Valve: Multiple regurgitant jets. The mitral valve is degenerative. There is a mobile density on the anterior mitral valve leaflet that is poorly visualized but  seems to have also been present on the TTE 07/07/2017. The mitral valve is degenerative in appearance. Moderate to severe mitral annular calcification. Mild mitral valve regurgitation. Mild to moderate mitral valve stenosis. MV peak gradient, 21.2 mmHg. The mean mitral valve gradient is 8.0 mmHg. Tricuspid Valve: The tricuspid valve is normal in structure. Tricuspid valve regurgitation is moderate to severe. No evidence of tricuspid stenosis. Aortic Valve: Bioprosthetic aortic valve leaftlets are thin and moving well. Aortic valve gradients are moderately elevated, unchanged from 2019. There is a fixed, calcified density in the LVOT unchanged from 2019. The aortic valve has been repaired/replaced. Aortic valve regurgitation is not visualized. Moderate aortic stenosis is present. Aortic valve mean gradient measures 28.0 mmHg. Aortic valve peak gradient measures 49.6 mmHg. 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 Pulmonic Valve: The pulmonic valve was normal in structure. Pulmonic valve regurgitation is not visualized. No evidence of pulmonic stenosis. Aorta: The aortic root is normal in size and structure. IAS/Shunts: No  atrial level shunt detected by color flow Doppler.  LEFT VENTRICLE PLAX 2D LVOT diam:     2.20 cm LVOT Area:     3.80 cm  AORTIC VALVE AV Vmax:      352.00 cm/s AV Vmean:     250.000 cm/s AV VTI:       0.845 m AV Peak Grad: 49.6 mmHg AV Mean Grad: 28.0 mmHg MITRAL VALVE MV Peak grad: 21.2 mmHg      SHUNTS MV Mean grad: 8.0 mmHg       Systemic Diam: 2.20 cm MV Vmax:      2.30 m/s MV Vmean:     136.0 cm/s MR Peak grad:    107.3 mmHg MR Mean grad:    73.0 mmHg MR Vmax:         518.00 cm/s MR Vmean:        400.0 cm/s MR PISA:         0.57 cm MR PISA Eff ROA: 4 mm MR PISA Radius:  0.30 cm Skeet Latch MD Electronically signed by Skeet Latch MD Signature Date/Time: 06/12/2020/3:56:32 PM    Final         Scheduled Meds: . aspirin  325 mg Oral Daily  . calcitRIOL  0.25 mcg Oral QODAY  . cholecalciferol  2,000 Units Oral QHS  . cholecalciferol  4,000 Units Oral Daily  . docusate sodium  100 mg Oral BID  . finasteride  5 mg Oral Daily  . fluticasone  1 spray Each Nare Daily  . furosemide  40 mg Intravenous BID  . gentamicin cream   Topical Daily  . latanoprost  1 drop Both Eyes QHS  . levothyroxine  175 mcg Oral Q0600  . lidocaine      . mouth rinse  15 mL Mouth Rinse BID  . metoprolol tartrate  25 mg Oral BID  . multivitamin  1 tablet Oral Daily  . omega-3 acid ethyl esters  1 g Oral BID  . pantoprazole  40 mg Oral Daily  . polyethylene glycol  17 g Oral Daily  . predniSONE  40 mg Oral Daily  . senna-docusate  1 tablet Oral BID  . sertraline  100 mg Oral Daily  . simvastatin  40 mg Oral QPM  . sodium bicarbonate  1,300 mg Oral TID WC  . sodium chloride flush  3 mL Intravenous Q12H   Continuous Infusions: . ampicillin (OMNIPEN) IV 2 g (06/13/20 0919)  . cefTRIAXone (ROCEPHIN)  IV 2  g (06/13/20 1011)  . dialysis solution 4.25% low-MG/low-CA    . heparin Stopped (06/13/20 1257)     LOS: 5 days    Time spent: 35 minutes    Mella Inclan A Dae Highley, MD [facility]   If 7PM-7AM, please contact night-coverage [email or web address]  06/13/2020, 1:29 PM

## 2020-06-13 NOTE — Progress Notes (Signed)
[facility]  Date of Admission:  06/08/2020     Total days of antibiotics 6         ASSESSMENT:  [name] was negative for prosthetic valve endocarditis, however given the potential for pulmonary emboli seen on V/Q scan it would be prudent to treat with 6 weeks of renally dosed ceftriaxone and ampicillin. Blood cultures remain without growth to date. Tunneled central line has been ordered as he is on peritoneal dialysis. Home Health and OPAT orders placed. Continue current dose of antibiotics through 07/23/19. Will arrange follow up in ID office.  PLAN:  1. Continue renally dosed ceftriaxone and ampicillin through 1/24 2. OPAT and Home Health orders placed. 3. Tunneled central line placement per primary team. 4. Arrange follow up in the ID office.   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    Labs weekly while on IV antibiotics: _X_ CBC with differential _X_ BMP __ CMP X__ CRP _X_ ESR __ Vancomycin trough __ CK  __ Please pull PIC at completion of IV antibiotics _X_ Please leave PIC in place until doctor has seen patient or been notified  Fax weekly labs to [phone]  Clinic Follow Up Appt:  07/11/19 at 4pm with Dr. Tommy Nelson   Principal Problem:   Enterococcal bacteremia Active Problems:   CAD (coronary artery disease)   Depression   Essential (primary) hypertension   Chronic obstructive pulmonary disease, unspecified (HCC)   Hyperlipidemia, unspecified   Hypothyroidism, unspecified    Atherosclerotic heart disease of native coronary artery without angina pectoris   Morbid (severe) obesity due to excess calories (HCC)   Fever   Severe sepsis without septic shock (CODE) (HCC)   S/P AVR (aortic valve replacement)   Acute on chronic congestive heart failure (HCC)   Demand ischemia (HCC)   Elevated d-dimer   . aspirin  325 mg Oral Daily  . calcitRIOL  0.25 mcg Oral QODAY  . cholecalciferol  2,000 Units Oral QHS  . cholecalciferol  4,000 Units Oral Daily  . docusate sodium  100 mg Oral BID  . finasteride  5 mg Oral Daily  . fluticasone  1 spray Each Nare Daily  . furosemide  40 mg Intravenous BID  . gentamicin cream   Topical Daily  . latanoprost  1 drop Both Eyes QHS  . levothyroxine  175 mcg Oral Q0600  . mouth rinse  15 mL Mouth Rinse BID  . metoprolol tartrate  25 mg Oral BID  . multivitamin  1 tablet Oral Daily  . omega-3 acid ethyl esters  1 g Oral BID  . pantoprazole  40 mg Oral Daily  . polyethylene glycol  17 g Oral Daily  . predniSONE  40 mg Oral Daily  . senna-docusate  1 tablet Oral BID  . sertraline  100 mg Oral Daily  . simvastatin  40 mg Oral QPM  . sodium bicarbonate  1,300 mg Oral TID WC  . sodium chloride flush  3 mL Intravenous Q12H    SUBJECTIVE:  Afebrile overnight with no acute events. TEE without evidence  of endocarditis. Denies fevers/chills.   No Known Allergies   Review of Systems: Review of Systems  Constitutional: Negative for chills, fever and weight loss.  Respiratory: Negative for cough, shortness of breath and wheezing.   Cardiovascular: Negative for chest pain and leg swelling.  Gastrointestinal: Negative for abdominal pain, constipation, diarrhea, nausea and vomiting.  Skin: Negative for rash.      OBJECTIVE: Vitals:   06/12/20 2154 06/12/20 2305 06/13/20 0544 06/13/20 1006  BP: 120/61  136/77 (!) 145/75  Pulse: 71 70 68 68  Resp: '16 16 18 18  ' Temp: 97.6 F (36.4 C)  97.8 F (36.6 C) 97.7 F (36.5 C)   TempSrc: Oral  Oral Oral  SpO2: 96%  98% 99%  Weight: 128 kg     Height:       Body mass index is 41.67 kg/m.  Physical Exam Constitutional:      General: He is not in acute distress.    Appearance: He is well-developed and well-nourished. He is obese.     Comments: Seated in the chair next to bed; pleasant.   Cardiovascular:     Rate and Rhythm: Normal rate and regular rhythm.     Pulses: Intact distal pulses.     Heart sounds: Normal heart sounds.  Pulmonary:     Effort: Pulmonary effort is normal.     Breath sounds: Normal breath sounds.  Skin:    General: Skin is warm and dry.  Neurological:     Mental Status: He is alert and oriented to person, place, and time.  Psychiatric:        Mood and Affect: Mood and affect normal.        Behavior: Behavior normal.        Thought Content: Thought content normal.        Judgment: Judgment normal.     Lab Results Lab Results  Component Value Date   WBC 17.0 (H) 06/13/2020   HGB 8.4 (L) 06/13/2020   HCT 26.9 (L) 06/13/2020   MCV 98.5 06/13/2020   PLT 223 06/13/2020    Lab Results  Component Value Date   CREATININE 7.39 (H) 06/13/2020   BUN 119 (H) 06/13/2020   NA 140 06/13/2020   K 3.8 06/13/2020   CL 103 06/13/2020   CO2 20 (L) 06/13/2020    Lab Results  Component Value Date   ALT 20 06/11/2020   AST 36 06/11/2020   ALKPHOS 48 06/11/2020   BILITOT 0.5 06/11/2020     Microbiology: Recent Results (from the past 240 hour(s))  Blood Culture (routine x 2)     Status: Abnormal   Collection Time: 06/08/20  4:21 PM   Specimen: BLOOD  Result Value Ref Range Status   Specimen Description   Final    BLOOD Performed at [facility], [address] [place], [place] [address]    Special Requests   Final    NONE Performed at [facility], [address], [place], [place] [address]    Culture  Setup Time   Final    GRAM POSITIVE COCCI Gram Stain Report Called to,Read Back By and Verified With: HEATHER CRAWFORD,RN  '@[id number]'  06/09/2020 KAY IN BOTH AEROBIC AND ANAEROBIC BOTTLES    Culture (A)  Final    ENTEROCOCCUS FAECALIS SUSCEPTIBILITIES PERFORMED ON PREVIOUS CULTURE WITHIN THE LAST 5 DAYS. Performed at [facility], [place] [address] [place], [place] [address]    Report Status 06/11/2020 FINAL  Final  Blood Culture (  routine x 2)     Status: Abnormal   Collection Time: 06/08/20  4:26 PM   Specimen: BLOOD  Result Value Ref Range Status   Specimen Description   Final    BLOOD Performed at [facility], [address], [place], [place] [address]    Special Requests   Final    NONE Performed at [facility], [address] [place], [place] [address]    Culture  Setup Time   Final    GRAM POSITIVE COCCI ANAEROBIC BOTTLE ONLY Gram Stain Report Called to,Read Back By and Verified With: HEATHER CRAWFORD,RN '@[id number]'  06/09/2020 KAY GRAM POSITIVE COCCI AEROBIC BOTTLE ONLY Organism ID to follow CRITICAL RESULT CALLED TO, READ BACK BY AND VERIFIED WITH: Alycia Patten [phone] Performed at [facility], [place] [address] [place], [place] [address]    Culture ENTEROCOCCUS FAECALIS (A)  Final   Report Status 06/11/2020 FINAL  Final   Organism ID, Bacteria ENTEROCOCCUS FAECALIS  Final      Susceptibility   Enterococcus faecalis - MIC*    AMPICILLIN <=2 SENSITIVE Sensitive     VANCOMYCIN 1 SENSITIVE Sensitive     GENTAMICIN SYNERGY SENSITIVE Sensitive     * ENTEROCOCCUS FAECALIS  Blood Culture ID Panel (Reflexed)     Status: Abnormal   Collection Time: 06/08/20  4:26 PM  Result Value Ref Range Status   Enterococcus faecalis DETECTED (A) NOT DETECTED Final    Comment: CRITICAL RESULT CALLED TO, READ BACK BY AND VERIFIED WITH: PHARMD K PIERCE [phone] MLM    Enterococcus Faecium NOT DETECTED NOT DETECTED Final   Listeria monocytogenes NOT DETECTED NOT DETECTED Final   Staphylococcus species NOT DETECTED NOT DETECTED Final   Staphylococcus aureus (BCID) NOT DETECTED NOT DETECTED Final    Staphylococcus epidermidis NOT DETECTED NOT DETECTED Final   Staphylococcus lugdunensis NOT DETECTED NOT DETECTED Final   Streptococcus species NOT DETECTED NOT DETECTED Final   Streptococcus agalactiae NOT DETECTED NOT DETECTED Final   Streptococcus pneumoniae NOT DETECTED NOT DETECTED Final   Streptococcus pyogenes NOT DETECTED NOT DETECTED Final   A.calcoaceticus-baumannii NOT DETECTED NOT DETECTED Final   Bacteroides fragilis NOT DETECTED NOT DETECTED Final   Enterobacterales NOT DETECTED NOT DETECTED Final   Enterobacter cloacae complex NOT DETECTED NOT DETECTED Final   Escherichia coli NOT DETECTED NOT DETECTED Final   Klebsiella aerogenes NOT DETECTED NOT DETECTED Final   Klebsiella oxytoca NOT DETECTED NOT DETECTED Final   Klebsiella pneumoniae NOT DETECTED NOT DETECTED Final   Proteus species NOT DETECTED NOT DETECTED Final   Salmonella species NOT DETECTED NOT DETECTED Final   Serratia marcescens NOT DETECTED NOT DETECTED Final   Haemophilus influenzae NOT DETECTED NOT DETECTED Final   Neisseria meningitidis NOT DETECTED NOT DETECTED Final   Pseudomonas aeruginosa NOT DETECTED NOT DETECTED Final   Stenotrophomonas maltophilia NOT DETECTED NOT DETECTED Final   Candida albicans NOT DETECTED NOT DETECTED Final   Candida auris NOT DETECTED NOT DETECTED Final   Candida glabrata NOT DETECTED NOT DETECTED Final   Candida krusei NOT DETECTED NOT DETECTED Final   Candida parapsilosis NOT DETECTED NOT DETECTED Final   Candida tropicalis NOT DETECTED NOT DETECTED Final   Cryptococcus neoformans/gattii NOT DETECTED NOT DETECTED Final   Vancomycin resistance NOT DETECTED NOT DETECTED Final    Comment: Performed at [facility], [address], [place], [place] [address]  Resp Panel by RT-PCR (Flu A&B, Covid) Nasopharyngeal Swab     Status: None  Collection Time: 06/08/20  5:05 PM   Specimen: Nasopharyngeal Swab; Nasopharyngeal(NP) swabs in vial transport medium  Result Value  Ref Range Status   SARS Coronavirus 2 by RT PCR NEGATIVE NEGATIVE Final    Comment: (NOTE) SARS-CoV-2 target nucleic acids are NOT DETECTED.  The SARS-CoV-2 RNA is generally detectable in upper respiratory specimens during the acute phase of infection. The lowest concentration of SARS-CoV-2 viral copies this assay can detect is 138 copies/mL. A negative result does not preclude SARS-Cov-2 infection and should not be used as the sole basis for treatment or other patient management decisions. A negative result may occur with  improper specimen collection/handling, submission of specimen other than nasopharyngeal swab, presence of viral mutation(s) within the areas targeted by this assay, and inadequate number of viral copies(<138 copies/mL). A negative result must be combined with clinical observations, patient history, and epidemiological information. The expected result is Negative.  Fact Sheet for Patients:  EntrepreneurPulse.com.au  Fact Sheet for Healthcare Providers:  IncredibleEmployment.be  This test is no t yet approved or cleared by the [place] FDA and  has been authorized for detection and/or diagnosis of SARS-CoV-2 by FDA under an Emergency Use Authorization (EUA). This EUA will remain  in effect (meaning this test can be used) for the duration of the COVID-19 declaration under Section 564(b)(1) of the Act, 21 U.S.C.section 360bbb-3(b)(1), unless the authorization is terminated  or revoked sooner.       Influenza A by PCR NEGATIVE NEGATIVE Final   Influenza B by PCR NEGATIVE NEGATIVE Final    Comment: (NOTE) The Xpert Xpress SARS-CoV-2/FLU/RSV plus assay is intended as an aid in the diagnosis of influenza from Nasopharyngeal swab specimens and should not be used as a sole basis for treatment. Nasal washings and aspirates are unacceptable for Xpert Xpress SARS-CoV-2/FLU/RSV testing.  Fact Sheet for  Patients: EntrepreneurPulse.com.au  Fact Sheet for Healthcare Providers: IncredibleEmployment.be  This test is not yet approved or cleared by the [place] FDA and has been authorized for detection and/or diagnosis of SARS-CoV-2 by FDA under an Emergency Use Authorization (EUA). This EUA will remain in effect (meaning this test can be used) for the duration of the COVID-19 declaration under Section 564(b)(1) of the Act, 21 U.S.C. section 360bbb-3(b)(1), unless the authorization is terminated or revoked.  Performed at [facility], [address], [place], [place] [address]   Culture, body fluid-bottle     Status: None (Preliminary result)   Collection Time: 06/10/20  3:44 AM   Specimen: Peritoneal Dialysate  Result Value Ref Range Status   Specimen Description PERITONEAL DIALYSATE FLUID  Final   Special Requests   Final    BOTTLES DRAWN AEROBIC AND ANAEROBIC Blood Culture adequate volume   Culture   Final    NO GROWTH 2 DAYS Performed at [facility], [address], [place], [place] [address]    Report Status PENDING  Incomplete  Gram stain     Status: None   Collection Time: 06/10/20  3:44 AM   Specimen: Peritoneal Dialysate  Result Value Ref Range Status   Specimen Description PERITONEAL DIALYSATE FLUID  Final   Special Requests NONE  Final   Gram Stain   Final    WBC PRESENT,BOTH PMN AND MONONUCLEAR NO ORGANISMS SEEN CYTOSPIN SMEAR Performed at [facility], [address], [place], [place] [address]    Report Status 06/10/2020 FINAL  Final  Culture, blood (routine x 2)     Status: None (Preliminary result)  Collection Time: 06/10/20  5:17 AM   Specimen: BLOOD RIGHT HAND  Result Value Ref Range Status   Specimen Description BLOOD RIGHT HAND  Final   Special Requests   Final    BOTTLES DRAWN AEROBIC ONLY Blood Culture adequate volume   Culture   Final    NO GROWTH 2 DAYS Performed at [facility], [address] [place], [place] [address]    Report Status PENDING  Incomplete  Culture, blood (routine x 2)     Status: None (Preliminary result)   Collection Time: 06/10/20  5:17 AM   Specimen: BLOOD RIGHT HAND  Result Value Ref Range Status   Specimen Description BLOOD RIGHT HAND  Final   Special Requests   Final    BOTTLES DRAWN AEROBIC ONLY Blood Culture results may not be optimal due to an inadequate volume of blood received in culture bottles   Culture   Final    NO GROWTH 2 DAYS Performed at [facility], [place] [address], [place], [place] [address]    Report Status PENDING  Incomplete     Terri Piedra, NP [facility]  06/13/2020  10:48 AM

## 2020-06-13 NOTE — Discharge Instructions (Signed)
Information on my medicine - ELIQUIS (apixaban)   Why was Eliquis prescribed for you? Eliquis was prescribed to treat blood clots that may have been found in the veins of your legs (deep vein thrombosis) or in your lungs (pulmonary embolism) and to reduce the risk of them occurring again.  What do You need to know about Eliquis ? The starting dose is 10 mg (two 5 mg tablets) taken TWICE daily for the FIRST FIVE (5)  DAYS, then on 06/18/2020  the dose is reduced to ONE 5 mg tablet taken TWICE daily.  Eliquis may be taken with or without food.   Try to take the dose about the same time in the morning and in the evening. If you have difficulty swallowing the tablet whole please discuss with your pharmacist how to take the medication safely.  Take Eliquis exactly as prescribed and DO NOT stop taking Eliquis without talking to the doctor who prescribed the medication.  Stopping may increase your risk of developing a new blood clot.  Refill your prescription before you run out.  After discharge, you should have regular check-up appointments with your healthcare provider that is prescribing your Eliquis.    What do you do if you miss a dose? If a dose of ELIQUIS is not taken at the scheduled time, take it as soon as possible on the same day and twice-daily administration should be resumed. The dose should not be doubled to make up for a missed dose.  Important Safety Information A possible side effect of Eliquis is bleeding. You should call your healthcare provider right away if you experience any of the following: ? Bleeding from an injury or your nose that does not stop. ? Unusual colored urine (red or dark brown) or unusual colored stools (red or black). ? Unusual bruising for unknown reasons. ? A serious fall or if you hit your head (even if there is no bleeding).  Some medicines may interact with Eliquis and might increase your risk of bleeding or clotting while on Eliquis. To help  avoid this, consult your healthcare provider or pharmacist prior to using any new prescription or non-prescription medications, including herbals, vitamins, non-steroidal anti-inflammatory drugs (NSAIDs) and supplements.  This website has more information on Eliquis (apixaban): http://www.eliquis.com/eliquis/home

## 2020-06-13 NOTE — Progress Notes (Signed)
Physical Therapy Treatment Patient Details Name: [name] MRN: [id number] DOB: 06-20-1946 Today's Date: 06/13/2020    History of Present Illness 74 y.o. male, with past medical history of COPD, CAD, hypertension, depression, hypothyroidism, hyperlipidemia, obesity, MGUS, patient with progressive renal disease, is being planned to initiate dialysis, he is is been followed by [facility] nephrology, patient had peritoneal dialysis catheter inserted on 05/14/2020, and left aVF inserted by Dr. Dixie Dials 42/11/8339, patient had 5 session of training peritoneal dialysis last week, per wife report he did receive IV iron for anemia, she reports he did have 1600 cc negative balance during session yesterday, patient presented to ED secondary to complaints of fever and chills, he is vaccinated against Covid, reports progressive dyspnea over the last couple days, worsening lower extremity edema over last 2 weeks, as well he does report some wheezing, dry heaving, he denies any chest pain.    PT Comments    The pt was able to make good progress with mobility and PT goals at this time. He was able to complete multiple transfers in his room without HHA or UE support, and complete hallway ambulation with minG assist but no UE support as well. The pt continues to fatigue with continued activity, and requires 2L O2 to maintain SpO2 > 93%. The pt was educated on energy conservation and falls prevention at this time. The pt will continue to benefit from skilled PT to progress functional endurance and dynamic stability to reduce risk of falls at home.   SATURATION QUALIFICATIONS: (This note is used to comply with regulatory documentation for home oxygen)  Patient Saturations on Room Air at Rest = 98%  Patient Saturations on Room Air while Ambulating = 85%  Patient Saturations on 2 Liters of oxygen while Ambulating = 93%  Please briefly explain why patient needs home oxygen: The pt is unable to maintain SpO2  >90% without supplemental O2 at this time.    Follow Up Recommendations  Home health PT;Supervision for mobility/OOB     Equipment Recommendations  None recommended by PT    Recommendations for Other Services       Precautions / Restrictions Precautions Precautions: Fall Precaution Comments: Pt reports h/o falls. Restrictions Weight Bearing Restrictions: No    Mobility  Bed Mobility Overal bed mobility: Modified Independent             General bed mobility comments: pt sitting in recliner upon arrival, sitting EOB at end of session  Transfers Overall transfer level: Needs assistance Equipment used: None Transfers: Sit to/from Stand Sit to Stand: Min guard         General transfer comment: pt with mild instability, able to correct without intervention  Ambulation/Gait Ambulation/Gait assistance: Min guard Gait Distance (Feet): 150 Feet Assistive device: None Gait Pattern/deviations: Step-through pattern;Decreased stride length Gait velocity: decreased   General Gait Details: pt with multiple episodes of stepping to recover stability, but able to correct without PT intervention. minG for safety. initially stable on 1L, but benefits from increase to 2L with rest breaks every 50-75 ft to maintain SpO2 >94%      Balance Overall balance assessment: Needs assistance Sitting-balance support: No upper extremity supported;Feet supported Sitting balance-Leahy Scale: Good     Standing balance support: No upper extremity supported Standing balance-Leahy Scale: Fair Standing balance comment: static stand without assist, min/HHA ambulation  Cognition Arousal/Alertness: Awake/alert Behavior During Therapy: WFL for tasks assessed/performed Overall Cognitive Status: Within Functional Limits for tasks assessed                                        Exercises      General Comments General comments (skin  integrity, edema, etc.): VSS on 2L O2      Pertinent Vitals/Pain Pain Assessment: No/denies pain Faces Pain Scale: No hurt Pain Intervention(s): Monitored during session           PT Goals (current goals can now be found in the care plan section) Acute Rehab PT Goals Patient Stated Goal: home PT Goal Formulation: With patient/family Time For Goal Achievement: 06/23/20 Potential to Achieve Goals: Good Progress towards PT goals: Progressing toward goals    Frequency    Min 3X/week      PT Plan Current plan remains appropriate       AM-PAC PT "6 Clicks" Mobility   Outcome Measure  Help needed turning from your back to your side while in a flat bed without using bedrails?: None Help needed moving from lying on your back to sitting on the side of a flat bed without using bedrails?: A Little Help needed moving to and from a bed to a chair (including a wheelchair)?: A Little Help needed standing up from a chair using your arms (e.g., wheelchair or bedside chair)?: A Little Help needed to walk in hospital room?: A Little Help needed climbing 3-5 steps with a railing? : A Lot 6 Click Score: 18    End of Session Equipment Utilized During Treatment: Gait belt;Oxygen Activity Tolerance: Patient tolerated treatment well Patient left: in bed;with call bell/phone within reach (sitting EOb) Nurse Communication: Mobility status PT Visit Diagnosis: Unsteadiness on feet (R26.81);Muscle weakness (generalized) (M62.81);History of falling (Z91.81)     Time: 1093-2355 PT Time Calculation (min) (ACUTE ONLY): 23 min  Charges:  $Gait Training: 8-22 mins $Therapeutic Activity: 8-22 mins                     [name], PT, DPT   Acute Rehabilitation Department Pager #: [phone]   Otho Bellows 06/13/2020, 6:27 PM

## 2020-06-13 NOTE — Plan of Care (Signed)
  Problem: Health Behavior/Discharge Planning: Goal: Ability to manage health-related needs will improve Outcome: Progressing   

## 2020-06-13 NOTE — Progress Notes (Signed)
Patient ID: [name], male   DOB: 1945/11/25, 74 y.o.   MRN: [id number] [facility] Progress Note   Assessment/ Plan:   1.  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  He has a recently placed left radiocephalic fistula with well-healed surgical incision site.  I will order for peritoneal dialysis tonight to assist with volume unloading/management of azotemia. 2.  Acute exacerbation of congestive heart failure: Possibly with concomitant pneumonia versus COPD exacerbation.  Continue furosemide with supplemental UF with PD. 3.  Enterococcus bacteremia: On antibiotic coverage with ceftriaxone and ampicillin.  TEE yesterday negative for evidence of endocarditis; antibiotic therapy planned for the next 6 weeks via central line.  Anticipate discharge home tomorrow. 4.  [place] metabolic acidosis: Secondary to progressive chronic kidney disease, on oral sodium bicarbonate. 5.  Anemia: Likely secondary to chronic kidney disease, iron stores are replete.  Will treat with Aranesp.  Subjective:   Denies any chest pain or shortness of breath at rest and continues to require supplemental oxygen.  Unfortunately with nursing staff shortage, was not disconnected from PD cycler on time.   Objective:   BP (!) 145/75 (BP Location: Right Arm)   Pulse 68   Temp 97.7 F (36.5 C) (Oral)   Resp 18   Ht 5\' 9"  (1.753 m)   Wt 128 kg   SpO2 99%   BMI 41.67 kg/m   Intake/Output Summary (Last 24 hours) at 06/13/2020 1054 Last data filed at 06/13/2020 1000 Gross per 24 hour  Intake 996.8 ml  Output 870 ml  Net 126.8 ml   Weight change: 0 kg  Physical Exam: Gen: Appears comfortable resting in recliner, wife at bedside.  On oxygen via nasal cannula CVS: Pulse regular rhythm, normal rate, S1 and S2 normal Resp: Poor inspiratory effort with decreased breath sounds  over bases Abd: Soft, obese, nontender.  PD catheter in situ Ext: 3+ pitting lower extremity edema.  Left RCF with palpable thrill.  Imaging: ECHO TEE  Result Date: 06/12/2020    TRANSESOPHOGEAL ECHO REPORT   Patient Name:   [name] Date of Exam: 06/12/2020 Medical Rec #:  [id number]     Height:       69.0 in Accession #:    [id number]    Weight:       283.7 lb Date of Birth:  12-28-1945     BSA:          2.397 m Patient Age:    2 years      BP:           128/60 mmHg Patient Gender: M             HR:           70 bpm. Exam Location:  Inpatient Procedure: Transesophageal Echo, Cardiac Doppler and Color Doppler Indications:     Enterococcal bacteremia  History:         Patient has prior history of Echocardiogram examinations, most                  recent 06/10/2020.                  Aortic Valve: bioprosthetic valve is present in the aortic                  position.  Sonographer:     Philipp Deputy Referring Phys:  [id number] [facility] Diagnosing Phys: Skeet Latch MD PROCEDURE: After discussion of the risks and benefits of a TEE, an informed consent was obtained from the patient. The transesophogeal probe was passed without difficulty through the esophogus of the patient. Sedation performed by different physician. Image quality was adequate. The patient's vital signs; including heart rate, blood pressure, and oxygen saturation; remained stable throughout the procedure. The patient developed no complications during the procedure. IMPRESSIONS  1. Left ventricular ejection fraction, by estimation, is 60 to 65%. The left ventricle has normal function. The left ventricle has no regional wall motion abnormalities.  2. Right ventricular systolic function is normal. The right ventricular size is normal.  3. Left atrial size was mildly dilated. No left atrial/left atrial appendage thrombus was detected.  4. Right atrial size was mildly dilated.  5. Multiple regurgitant jets. The mitral valve is  degenerative. There is a mobile density on the anterior mitral valve leaflet that is poorly visualized but seems to have also been present on the TTE 07/07/2017. The mitral valve is degenerative. Mild mitral valve regurgitation. Mild to moderate mitral stenosis. Moderate to severe mitral annular calcification.  6. Tricuspid valve regurgitation is moderate to severe.  7. 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 Aortic valve gradients are moderately elevated, unchanged from 2019. There is a fixed, calcified density in the LVOT unchanged from 2019. The aortic valve has been repaired/replaced. Aortic valve regurgitation is not visualized. Moderate aortic valve stenosis. 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 Aortic valve mean gradient measures 28.0 mmHg. Aortic valve Vmax measures 3.52 m/s. Conclusion(s)/Recommendation(s): No evidence of vegetation/infective endocarditis on this transesophageal echocardiogram. FINDINGS  Left Ventricle: Left ventricular ejection fraction, by estimation, is 60 to 65%. The left ventricle has normal function. The left ventricle has no regional wall motion abnormalities. The left ventricular internal cavity size was normal in size. There is  no left ventricular hypertrophy. Right Ventricle: The right ventricular size is normal. No increase in right ventricular wall thickness. Right ventricular systolic function is normal. Left Atrium: Left atrial size was mildly dilated. No left atrial/left atrial appendage thrombus was detected. Right Atrium: Right atrial size was mildly dilated. Pericardium: There is no evidence of pericardial effusion. Mitral Valve: Multiple regurgitant jets. The mitral valve is degenerative. There is a mobile density on the anterior mitral valve leaflet that is poorly visualized but seems to have also been present on the TTE 07/07/2017. The mitral valve is degenerative in appearance. Moderate to severe mitral annular  calcification. Mild mitral valve regurgitation. Mild to moderate mitral valve stenosis. MV peak gradient, 21.2 mmHg. The mean mitral valve gradient is 8.0 mmHg. Tricuspid Valve: The tricuspid valve is normal in structure. Tricuspid valve regurgitation is moderate to severe. No evidence of tricuspid stenosis. Aortic Valve: Bioprosthetic aortic valve leaftlets are thin and moving well. Aortic valve gradients are moderately elevated, unchanged from 2019. There is a fixed, calcified density in the LVOT unchanged from 2019. The aortic valve has been repaired/replaced. Aortic valve regurgitation is not visualized. Moderate aortic stenosis is present. Aortic valve mean gradient measures 28.0 mmHg. Aortic valve peak gradient measures 49.6 mmHg. 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 Pulmonic Valve: The pulmonic valve was normal in structure. Pulmonic valve regurgitation is not visualized. No evidence of pulmonic stenosis. Aorta: The aortic root is normal in size and structure. IAS/Shunts: No atrial level shunt detected by color flow Doppler.  LEFT VENTRICLE PLAX 2D LVOT diam:  2.20 cm LVOT Area:     3.80 cm  AORTIC VALVE AV Vmax:      352.00 cm/s AV Vmean:     250.000 cm/s AV VTI:       0.845 m AV Peak Grad: 49.6 mmHg AV Mean Grad: 28.0 mmHg MITRAL VALVE MV Peak grad: 21.2 mmHg      SHUNTS MV Mean grad: 8.0 mmHg       Systemic Diam: 2.20 cm MV Vmax:      2.30 m/s MV Vmean:     136.0 cm/s MR Peak grad:    107.3 mmHg MR Mean grad:    73.0 mmHg MR Vmax:         518.00 cm/s MR Vmean:        400.0 cm/s MR PISA:         0.57 cm MR PISA Eff ROA: 4 mm MR PISA Radius:  0.30 cm Skeet Latch MD Electronically signed by Skeet Latch MD Signature Date/Time: 06/12/2020/3:56:32 PM    Final     Labs: BMET Recent Labs  Lab 06/08/20 1621 06/09/20 0415 06/09/20 1012 06/10/20 0517 06/11/20 0125 06/12/20 0442 06/13/20 0326  NA 135 139 138 139 139 140 140  K 3.9 3.7 3.5 3.4* 4.1 4.2 3.8  CL  101 104 104 104 104 104 103  CO2 21* 18* 17* 17* 19* 20* 20*  GLUCOSE 146* 188* 190* 189* 182* 139* 120*  BUN 71* 75* 79* 90* 105* 119* 119*  CREATININE 5.53* 5.72* 6.06* 6.52* 6.85* 7.24* 7.39*  CALCIUM 8.6* 8.8* 8.6* 8.7* 8.7* 8.6* 8.6*  PHOS  --   --   --   --  6.1* 7.7* 7.9*   CBC Recent Labs  Lab 06/08/20 1332 06/09/20 0415 06/10/20 0517 06/11/20 0125 06/12/20 0442 06/13/20 0326  WBC 22.3*   < > 18.5* 19.1* 19.9* 17.0*  NEUTROABS 20.2*  --  17.4*  --   --   --   HGB 8.8*   < > 7.8* 7.8* 8.2* 8.4*  HCT 27.1*   < > 23.9* 24.8* 26.5* 26.9*  MCV 98.2   < > 96.4 98.0 99.3 98.5  PLT 224   < > 213 230 229 223   < > = values in this interval not displayed.    Medications:    . aspirin  325 mg Oral Daily  . calcitRIOL  0.25 mcg Oral QODAY  . cholecalciferol  2,000 Units Oral QHS  . cholecalciferol  4,000 Units Oral Daily  . docusate sodium  100 mg Oral BID  . finasteride  5 mg Oral Daily  . fluticasone  1 spray Each Nare Daily  . furosemide  40 mg Intravenous BID  . gentamicin cream   Topical Daily  . latanoprost  1 drop Both Eyes QHS  . levothyroxine  175 mcg Oral Q0600  . mouth rinse  15 mL Mouth Rinse BID  . metoprolol tartrate  25 mg Oral BID  . multivitamin  1 tablet Oral Daily  . omega-3 acid ethyl esters  1 g Oral BID  . pantoprazole  40 mg Oral Daily  . polyethylene glycol  17 g Oral Daily  . predniSONE  40 mg Oral Daily  . senna-docusate  1 tablet Oral BID  . sertraline  100 mg Oral Daily  . simvastatin  40 mg Oral QPM  . sodium bicarbonate  1,300 mg Oral TID WC  . sodium chloride flush  3 mL Intravenous Q12H   Elmarie Shiley, MD 06/13/2020,  10:54 AM

## 2020-06-13 NOTE — Procedures (Addendum)
Interventional Radiology Procedure Note  Procedure: Placement of a right IJ approach single lumen tunneled cuffed central venous catheter.  Tip is positioned at the superior cavoatrial junction and catheter is ready for immediate use.  Complications: None Recommendations:  - Ok to use - Do not submerge - Routine line care  - ok to restart blood thinners now  Signed,  Dulcy Fanny. Earleen Newport, DO

## 2020-06-13 NOTE — Progress Notes (Signed)
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 To discharging provider:  please sign these orders via discharge navigator,  Select New Orders & click on the button choice - Manage This Unsigned Work.     Thank you for allowing pharmacy to be a part of this patient's care.  Jimmy Footman, PharmD, BCPS, BCIDP Infectious Diseases Clinical Pharmacist Phone: [phone] 06/13/2020, 10:41 AM

## 2020-06-14 ENCOUNTER — Other Ambulatory Visit (HOSPITAL_COMMUNITY): Payer: Self-pay | Admitting: Internal Medicine

## 2020-06-14 LAB — CBC
HCT: 26.6 % — ABNORMAL LOW (ref 39.0–52.0)
Hemoglobin: 8.8 g/dL — ABNORMAL LOW (ref 13.0–17.0)
MCH: 31.9 pg (ref 26.0–34.0)
MCHC: 33.1 g/dL (ref 30.0–36.0)
MCV: 96.4 fL (ref 80.0–100.0)
Platelets: 240 10*3/uL (ref 150–400)
RBC: 2.76 MIL/uL — ABNORMAL LOW (ref 4.22–5.81)
RDW: 14.7 % (ref 11.5–15.5)
WBC: 16.2 10*3/uL — ABNORMAL HIGH (ref 4.0–10.5)
nRBC: 1.6 % — ABNORMAL HIGH (ref 0.0–0.2)

## 2020-06-14 MED ORDER — APIXABAN 5 MG PO TABS: 10.0000 mg | ORAL_TABLET | Freq: Two times a day (BID) | ORAL | 0 refills | Status: DC

## 2020-06-14 MED ORDER — ASPIRIN 325 MG PO TABS: 325.0000 mg | ORAL_TABLET | Freq: Every day | ORAL | 0 refills | Status: DC

## 2020-06-14 MED ORDER — AMPICILLIN IV (FOR PTA / DISCHARGE USE ONLY): 2.0000 g | Freq: Two times a day (BID) | INTRAVENOUS | 0 refills | Status: AC

## 2020-06-14 MED ORDER — PREDNISONE 20 MG PO TABS: 40.0000 mg | ORAL_TABLET | Freq: Every day | ORAL | 0 refills | Status: AC

## 2020-06-14 MED ORDER — POLYETHYLENE GLYCOL 3350 17 G PO PACK: 17.0000 g | PACK | Freq: Every day | ORAL | 0 refills | Status: AC

## 2020-06-14 MED ORDER — CEFTRIAXONE IV (FOR PTA / DISCHARGE USE ONLY): 2.0000 g | Freq: Two times a day (BID) | INTRAVENOUS | 0 refills | Status: AC

## 2020-06-14 MED ORDER — APIXABAN 5 MG PO TABS
10.0000 mg | ORAL_TABLET | Freq: Two times a day (BID) | ORAL | 0 refills | Status: DC
Start: 2020-06-14 — End: 2020-07-14

## 2020-06-14 MED ORDER — SENNOSIDES-DOCUSATE SODIUM 8.6-50 MG PO TABS: 1.0000 | ORAL_TABLET | Freq: Two times a day (BID) | ORAL | 0 refills | Status: DC

## 2020-06-14 MED ORDER — APIXABAN 5 MG PO TABS: 5.0000 mg | ORAL_TABLET | Freq: Two times a day (BID) | ORAL | 3 refills | Status: DC

## 2020-06-14 MED ORDER — SODIUM BICARBONATE 650 MG PO TABS: 1300.0000 mg | ORAL_TABLET | Freq: Three times a day (TID) | ORAL | 2 refills | Status: DC

## 2020-06-14 MED ORDER — ASPIRIN EC 81 MG PO TBEC: 81.0000 mg | DELAYED_RELEASE_TABLET | Freq: Every day | ORAL | 2 refills | Status: AC

## 2020-06-14 MED ORDER — PREDNISONE 20 MG PO TABS
40.0000 mg | ORAL_TABLET | Freq: Every day | ORAL | 0 refills | Status: DC
Start: 2020-06-14 — End: 2020-06-14

## 2020-06-14 MED ORDER — POLYETHYLENE GLYCOL 3350 17 G PO PACK: 17.0000 g | PACK | Freq: Every day | ORAL | 0 refills | Status: DC

## 2020-06-14 MED FILL — ELIQUIS 5 MG TABLET: 5 | 30 days supply | Qty: 70 | Fill #0

## 2020-06-14 NOTE — Progress Notes (Signed)
Patient ID: [name], male   DOB: 22-Dec-1945, 74 y.o.   MRN: [id number] [facility] Progress Note   Assessment/ Plan:   1.  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  He has a recently placed left radiocephalic fistula with well-healed surgical incision site. Underwent peritoneal dialysis yesterday with 4.25% dianeal and consequent ultrafiltration along with excellent urine output. He will resume PD training upon discharge. 2.  Acute exacerbation of congestive heart failure: Possibly with concomitant pneumonia versus COPD exacerbation.  Continue furosemide with supplemental UF with PD. 3.  Enterococcus bacteremia: On antibiotic coverage with ceftriaxone and ampicillin.  TEE yesterday negative for evidence of endocarditis; antibiotic therapy planned for the next 6 weeks via central line.  Anticipate discharge home tomorrow. 4.  [place] metabolic acidosis: Secondary to progressive chronic kidney disease, on oral sodium bicarbonate. 5.  Anemia: Likely secondary to chronic kidney disease, iron stores are replete. Status post Aranesp.  Subjective:   Without any acute events overnight. Yesterday, he underwent placement of a central line for long-term antibiotics.   Objective:   BP (!) 146/68   Pulse 68   Temp 97.9 F (36.6 C) (Oral)   Resp 16   Ht 5\' 9"  (1.753 m)   Wt 123.1 kg   SpO2 100%   BMI 40.08 kg/m   Intake/Output Summary (Last 24 hours) at 06/14/2020 1018 Last data filed at 06/14/2020 1015 Gross per 24 hour  Intake 5744.85 ml  Output 5478 ml  Net 266.85 ml   Weight change: -18.5 kg  Physical Exam: Gen: Appears comfortable resting in recliner, wife at bedside.  On oxygen via nasal cannula CVS: Pulse regular rhythm, normal rate, S1 and S2 normal Resp: Poor inspiratory effort with decreased breath sounds over bases Abd: Soft,  obese, nontender.  PD catheter in situ Ext: 3+ pitting lower extremity edema.  Left RCF with palpable thrill.  Imaging: IR US Guide Vasc Access Right  Result Date: 06/13/2020 INDICATION: 74 year old male referred for tunneled catheter placement EXAM: IMAGE GUIDED TUNNELED CENTRAL VENOUS CATHETER PLACEMENT MEDICATIONS: None. ANESTHESIA/SEDATION: None. FLUOROSCOPY TIME:  Fluoroscopy Time: 0 minutes 6 seconds (6 mGy). COMPLICATIONS: None PROCEDURE: Informed written consent was obtained from the patient after a thorough discussion of the procedural risks, benefits and alternatives. All questions were addressed. Maximal Sterile Barrier Technique was utilized including caps, mask, sterile gowns, sterile gloves, sterile drape, hand hygiene and skin antiseptic. A timeout was performed prior to the initiation of the procedure. After written informed consent was obtained, patient was placed in the supine position on angiographic table. Patency of the right internal jugular vein was confirmed with ultrasound with image documentation. Patient was prepped and draped in the usual sterile fashion including the right neck and right superior chest. Using ultrasound guidance, the skin and subcutaneous tissues overlying the right internal jugular vein were generously infiltrated with 1% lidocaine without epinephrine. Using ultrasound guidance, the right internal jugular vein was punctured with a micropuncture needle, and an 018 wire was advanced into the right heart confirming venous access. A small stab incision was made with an 11 blade scalpel. Peel-away sheath was placed over the wire, and then the wire was removed, marking the wire for estimation of internal catheter length. The chest wall was then generously infiltrated with 1% lidocaine for local anesthesia along the tissue tract. Small stab incision was made with 11 blade scalpel, and then the catheter  was back tunneled to the puncture site at the right internal jugular  vein. Catheter was pulled through the tract, with the catheter amputated at 25 cm. Catheter was advanced through the peel-away sheath, and the peel-away sheath was removed. Final image was stored. The catheter was anchored to the chest wall with 2 retention sutures, and Derma bond was used to seal the right internal jugular vein incision site and at the right chest wall. Patient tolerated the procedure well and remained hemodynamically stable throughout. No complications were encountered and no significant blood loss was encountered. IMPRESSION: Status post image guided placement of tunneled cuffed central venous catheter. Signed, Dulcy Fanny. Dellia Nims, RPVI [facility] Radiology Electronically Signed   By: Corrie Mckusick D.O.   On: 06/13/2020 14:01   ECHO TEE  Result Date: 06/12/2020    TRANSESOPHOGEAL ECHO REPORT   Patient Name:   [name] Date of Exam: 06/12/2020 Medical Rec #:  [id number]     Height:       69.0 in Accession #:    [id number]    Weight:       283.7 lb Date of Birth:  11/17/1945     BSA:          2.397 m Patient Age:    70 years      BP:           128/60 mmHg Patient Gender: M             HR:           70 bpm. Exam Location:  Inpatient Procedure: Transesophageal Echo, Cardiac Doppler and Color Doppler Indications:     Enterococcal bacteremia  History:         Patient has prior history of Echocardiogram examinations, most                  recent 06/10/2020.                  Aortic Valve: bioprosthetic valve is present in the aortic                  position.  Sonographer:     Philipp Deputy Referring Phys:  [id number] Leanor Kail Diagnosing Phys: Skeet Latch MD PROCEDURE: After discussion of the risks and benefits of a TEE, an informed consent was obtained from the patient. The transesophogeal probe was passed without difficulty through the esophogus of the patient. Sedation performed by different physician. Image quality was  adequate. The patient's vital signs; including heart rate, blood pressure, and oxygen saturation; remained stable throughout the procedure. The patient developed no complications during the procedure. IMPRESSIONS  1. Left ventricular ejection fraction, by estimation, is 60 to 65%. The left ventricle has normal function. The left ventricle has no regional wall motion abnormalities.  2. Right ventricular systolic function is normal. The right ventricular size is normal.  3. Left atrial size was mildly dilated. No left atrial/left atrial appendage thrombus was detected.  4. Right atrial size was mildly dilated.  5. Multiple regurgitant jets. The mitral valve is degenerative. There is a mobile density on the anterior mitral valve leaflet that is poorly visualized but seems to have also been present on the TTE 07/07/2017. The mitral valve is degenerative. Mild mitral valve regurgitation. Mild to moderate mitral stenosis. Moderate to severe mitral annular calcification.  6. Tricuspid valve regurgitation is moderate to severe.  7. Bioprosthetic aortic valve leaftlets are thin and moving  well. Aortic valve gradients are moderately elevated, unchanged from 2019. There is a fixed, calcified density in the LVOT unchanged from 2019. The aortic valve has been repaired/replaced. Aortic valve regurgitation is not visualized. Moderate aortic valve stenosis. 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 Aortic valve mean gradient measures 28.0 mmHg. Aortic valve Vmax measures 3.52 m/s. Conclusion(s)/Recommendation(s): No evidence of vegetation/infective endocarditis on this transesophageal echocardiogram. FINDINGS  Left Ventricle: Left ventricular ejection fraction, by estimation, is 60 to 65%. The left ventricle has normal function. The left ventricle has no regional wall motion abnormalities. The left ventricular internal cavity size was normal in size. There is  no left ventricular hypertrophy. Right Ventricle: The  right ventricular size is normal. No increase in right ventricular wall thickness. Right ventricular systolic function is normal. Left Atrium: Left atrial size was mildly dilated. No left atrial/left atrial appendage thrombus was detected. Right Atrium: Right atrial size was mildly dilated. Pericardium: There is no evidence of pericardial effusion. Mitral Valve: Multiple regurgitant jets. The mitral valve is degenerative. There is a mobile density on the anterior mitral valve leaflet that is poorly visualized but seems to have also been present on the TTE 07/07/2017. The mitral valve is degenerative in appearance. Moderate to severe mitral annular calcification. Mild mitral valve regurgitation. Mild to moderate mitral valve stenosis. MV peak gradient, 21.2 mmHg. The mean mitral valve gradient is 8.0 mmHg. Tricuspid Valve: The tricuspid valve is normal in structure. Tricuspid valve regurgitation is moderate to severe. No evidence of tricuspid stenosis. Aortic Valve: Bioprosthetic aortic valve leaftlets are thin and moving well. Aortic valve gradients are moderately elevated, unchanged from 2019. There is a fixed, calcified density in the LVOT unchanged from 2019. The aortic valve has been repaired/replaced. Aortic valve regurgitation is not visualized. Moderate aortic stenosis is present. Aortic valve mean gradient measures 28.0 mmHg. Aortic valve peak gradient measures 49.6 mmHg. 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 Pulmonic Valve: The pulmonic valve was normal in structure. Pulmonic valve regurgitation is not visualized. No evidence of pulmonic stenosis. Aorta: The aortic root is normal in size and structure. IAS/Shunts: No atrial level shunt detected by color flow Doppler.  LEFT VENTRICLE PLAX 2D LVOT diam:     2.20 cm LVOT Area:     3.80 cm  AORTIC VALVE AV Vmax:      352.00 cm/s AV Vmean:     250.000 cm/s AV VTI:       0.845 m AV Peak Grad: 49.6 mmHg AV Mean Grad: 28.0 mmHg MITRAL VALVE  MV Peak grad: 21.2 mmHg      SHUNTS MV Mean grad: 8.0 mmHg       Systemic Diam: 2.20 cm MV Vmax:      2.30 m/s MV Vmean:     136.0 cm/s MR Peak grad:    107.3 mmHg MR Mean grad:    73.0 mmHg MR Vmax:         518.00 cm/s MR Vmean:        400.0 cm/s MR PISA:         0.57 cm MR PISA Eff ROA: 4 mm MR PISA Radius:  0.30 cm Skeet Latch MD Electronically signed by Skeet Latch MD Signature Date/Time: 06/12/2020/3:56:32 PM    Final    IR TUNNELED CENTRAL VENOUS CATHETER PLACEMENT  Result Date: 06/13/2020 INDICATION: 74 year old male referred for tunneled catheter placement EXAM: IMAGE GUIDED TUNNELED CENTRAL VENOUS CATHETER PLACEMENT MEDICATIONS: None. ANESTHESIA/SEDATION: None. FLUOROSCOPY TIME:  Fluoroscopy Time: 0 minutes 6 seconds (6 mGy). COMPLICATIONS: None PROCEDURE: Informed written consent was obtained from the patient after a thorough discussion of the procedural risks, benefits and alternatives. All questions were addressed. Maximal Sterile Barrier Technique was utilized including caps, mask, sterile gowns, sterile gloves, sterile drape, hand hygiene and skin antiseptic. A timeout was performed prior to the initiation of the procedure. After written informed consent was obtained, patient was placed in the supine position on angiographic table. Patency of the right internal jugular vein was confirmed with ultrasound with image documentation. Patient was prepped and draped in the usual sterile fashion including the right neck and right superior chest. Using ultrasound guidance, the skin and subcutaneous tissues overlying the right internal jugular vein were generously infiltrated with 1% lidocaine without epinephrine. Using ultrasound guidance, the right internal jugular vein was punctured with a micropuncture needle, and an 018 wire was advanced into the right heart confirming venous access. A small stab incision was made with an 11 blade scalpel. Peel-away sheath was placed over the wire, and then  the wire was removed, marking the wire for estimation of internal catheter length. The chest wall was then generously infiltrated with 1% lidocaine for local anesthesia along the tissue tract. Small stab incision was made with 11 blade scalpel, and then the catheter was back tunneled to the puncture site at the right internal jugular vein. Catheter was pulled through the tract, with the catheter amputated at 25 cm. Catheter was advanced through the peel-away sheath, and the peel-away sheath was removed. Final image was stored. The catheter was anchored to the chest wall with 2 retention sutures, and Derma bond was used to seal the right internal jugular vein incision site and at the right chest wall. Patient tolerated the procedure well and remained hemodynamically stable throughout. No complications were encountered and no significant blood loss was encountered. IMPRESSION: Status post image guided placement of tunneled cuffed central venous catheter. Signed, Dulcy Fanny. Dellia Nims, RPVI [facility] Radiology Electronically Signed   By: Corrie Mckusick D.O.   On: 06/13/2020 14:01    Labs: BMET Recent Labs  Lab 06/08/20 1621 06/09/20 0415 06/09/20 1012 06/10/20 0517 06/11/20 0125 06/12/20 0442 06/13/20 0326  NA 135 139 138 139 139 140 140  K 3.9 3.7 3.5 3.4* 4.1 4.2 3.8  CL 101 104 104 104 104 104 103  CO2 21* 18* 17* 17* 19* 20* 20*  GLUCOSE 146* 188* 190* 189* 182* 139* 120*  BUN 71* 75* 79* 90* 105* 119* 119*  CREATININE 5.53* 5.72* 6.06* 6.52* 6.85* 7.24* 7.39*  CALCIUM 8.6* 8.8* 8.6* 8.7* 8.7* 8.6* 8.6*  PHOS  --   --   --   --  6.1* 7.7* 7.9*   CBC Recent Labs  Lab 06/08/20 1332 06/09/20 0415 06/10/20 0517 06/11/20 0125 06/12/20 0442 06/13/20 0326 06/14/20 0218  WBC 22.3*   < > 18.5* 19.1* 19.9* 17.0* 16.2*  NEUTROABS 20.2*  --  17.4*  --   --   --   --   HGB 8.8*   < > 7.8* 7.8* 8.2* 8.4* 8.8*  HCT 27.1*   < > 23.9* 24.8* 26.5*  26.9* 26.6*  MCV 98.2   < > 96.4 98.0 99.3 98.5 96.4  PLT 224   < > 213 230 229 223 240   < > = values in this interval not displayed.    Medications:    . apixaban  10 mg Oral BID  Followed by  . [START ON 06/18/2020] apixaban  5 mg Oral BID  . aspirin  325 mg Oral Daily  . calcitRIOL  0.25 mcg Oral QODAY  . Chlorhexidine Gluconate Cloth  6 each Topical Daily  . cholecalciferol  2,000 Units Oral QHS  . cholecalciferol  4,000 Units Oral Daily  . docusate sodium  100 mg Oral BID  . finasteride  5 mg Oral Daily  . fluticasone  1 spray Each Nare Daily  . furosemide  40 mg Intravenous BID  . gentamicin cream   Topical Daily  . latanoprost  1 drop Both Eyes QHS  . levothyroxine  175 mcg Oral Q0600  . mouth rinse  15 mL Mouth Rinse BID  . metoprolol tartrate  25 mg Oral BID  . multivitamin  1 tablet Oral Daily  . omega-3 acid ethyl esters  1 g Oral BID  . pantoprazole  40 mg Oral Daily  . polyethylene glycol  17 g Oral Daily  . predniSONE  40 mg Oral Daily  . senna-docusate  1 tablet Oral BID  . sertraline  100 mg Oral Daily  . simvastatin  40 mg Oral QPM  . sodium bicarbonate  1,300 mg Oral TID WC  . sodium chloride flush  3 mL Intravenous Q12H   Elmarie Shiley, MD 06/14/2020, 10:18 AM

## 2020-06-14 NOTE — Progress Notes (Signed)
DISCHARGE NOTE HOME JAYLENN ALTIER to be discharged Home per MD order. Discussed prescriptions and follow up appointments with the patient. Prescriptions given to patient; medication list explained in detail. Patient verbalized understanding.  Skin clean, dry and intact without evidence of skin break down, no evidence of skin tears noted. IV catheter discontinued intact. Site without signs and symptoms of complications. Dressing and pressure applied. Pt denies pain at the site currently. No complaints noted.  Patient free of lines, drains, and wounds.   An After Visit Summary (AVS) was printed and given to the patient. Patient escorted via wheelchair, and discharged home via private auto.  Arlyss Repress, RN

## 2020-06-14 NOTE — TOC Benefit Eligibility Note (Signed)
Transition of Care [facility]) Benefit Eligibility Note    Patient Details  Name: [name] MRN: [id number] Date of Birth: [birth year]   Medication/Dose: Eliquis 5mg  BID  Covered?: Yes     Prescription Coverage [facility]  Spoke with Person/Company/Phone Number:: [facility]  Co-Pay: $9  Prior Approval: No          Delorse Lek Phone Number: 06/14/2020, 12:24 PM

## 2020-06-14 NOTE — Discharge Summary (Signed)
Physician Discharge Summary  [name] DSK:[id number] DOB: [birth year] DOA: 06/08/2020  PCP: Celene Squibb, MD  Admit date: 06/08/2020 Discharge date: 06/14/2020  Admitted From: Home  Disposition:  Home   Recommendations for Outpatient Follow-up:  1. Follow up with PCP in 1-2 weeks 2. Please obtain BMP/CBC in one week 3. Follow up with ID in 6 weeks after completion of antibiotics.  4. Follow up with nephrology for further care of renal failure.  5. Wean off oxygen as possible.  6. Follow up with cardiology to resume care.  7. He was discharge on eliquis for presume PE, he will need 3 to 6 months of treatment.   Home Health: yes  Discharge Condition: Stable.  CODE STATUS: Full code Diet recommendation: Heart Healthy.  Brief/Interim Summary: 74 year old with past medical history significant for COPD, CAD, hypertension, depression, history of aortic valve replacement, hypothyroidism, hyperlipidemia, obesity, MGUS, CKD status post peritoneal dialysis catheter on 05/14/2020 and left aVF inserted by Dr. Dixie Dials 62/08/5595 at [facility] who had 5 section of training peritoneal dialysis last week presented to the hospital with fevers, chills and progressive dyspnea with worsening lower extremity edema with cough and wheezing.  In the ED patient was noted to be hypoxic oxygen saturation in the mid 80s on room air, tolerating 2 L of oxygen.  Chest x-ray was significant for diffuse multifocal opacity related to volume overload, versus infectious process.  Patient was febrile temperature 101, leukocytosis, procalcitonin at 1.2, CRP 16, D-dimer 2.6, hemoglobin 8.8.  Patient was a started on broad-spectrum antibiotics was admitted for further evaluation.  Patient was found to have enterococcal bacteremia, underwent TEE that was negative for endocarditis.  plan to treat for presumptive endocarditis for 6 weeks.    1-Acute hypoxic respiratory failure:  -Patient presented with  oxygen saturation in the 80s requiring 2 L of oxygen. -Could be multifactorial secondary to volume overload, acute on chronic diastolic heart failure, COPD and possible pneumonia, Vs PE -VQ- scan from 12/13 show bilateral scattered segmental to subsegmental perfusion defects worrisome for pulmonary embolism. -Patient was a started on heparin drip. Plan to transition to Eliquis when no further procedure are planned. hopefully after central line is placed.  Continue with IV Laxis. Had PD on 12/15.   2-Enterococcal bacteremia: Patient presented with fever.  Peritoneal fluid Gram stain without organisms. 2D echo negative.  TEE negative. Continue with Rocephin and ampicillin at discharge for 6 weeks.  Plan to treat him for 6 weeks of IV antibiotics due to concern for right side endocarditis due to bilateral pulmonary embolic infiltrates on CT.  IR consulted for central line. underwent placement of right IJ single lumen tunneled cuffed central venous catheter.  Blood culture 12-13 no growth to date.  3-COPD exacerbation: Received IV Solu-Medrol.  Plan to do short taper of prednisone. 2 more days at discharge./    4-Possible multifocal pneumonia: Continue with IV antibiotics. Wean off oxygen as possible. Needs oxygen on ambulation. Arrange.   5-ESRD with evidence of significant volume overload, non-anion gap metabolic acidosis: Patient was being trained for peritoneal dialysis at home. Resume HD at discharge.  Nephrology following. Left AV fistula inserted by Dr. Dixie Dials 41/11/3843. [facility] [place] on [facility]/16. Continue with sodium bicarb tablet, increase dose.   Anemia of chronic kidney disease: Hb stable.   Hypertension: Continue with metoprolol. Resume Norvasc and hydralazine BP increasing.  Medications might need to be adjusted as he resume PD.  CAD: Continue with aspirin and statin and  beta-blockers. Hypothyroidism: Continue with Synthroid. BPH: Continue with Proscar. Depression: Continue with sertraline. Hyperlipidemia: Continue with simvastatin.   Discharge Diagnoses:  Principal Problem:   Enterococcal bacteremia Active Problems:   CAD (coronary artery disease)   Depression   Essential (primary) hypertension   Chronic obstructive pulmonary disease, unspecified (HCC)   Hyperlipidemia, unspecified   Hypothyroidism, unspecified   Atherosclerotic heart disease of native coronary artery without angina pectoris   Morbid (severe) obesity due to excess calories (HCC)   Fever   Severe sepsis without septic shock (CODE) (HCC)   S/P AVR (aortic valve replacement)   Acute on chronic congestive heart failure ([place])   Demand ischemia (HCC)   Elevated d-dimer   Endocarditis    Discharge Instructions  Discharge Instructions    [facility] pharmacist to adjust dose for Vancomycin, Aminoglycosides and other anti-infective therapies as requested by physician.   Complete by: As directed    [facility] to provide Cath Flo 476m   Complete by: As directed    Administer for PICC line occlusion and as ordered by physician for other access device issues.   Anaphylaxis Kit: Provided to treat any anaphylactic reaction to the medication being provided to the patient if First Dose or when requested by physician   Complete by: As directed    Epinephrine 175mml vial / amp: Administer 0.76m28m0.76ml80mubcutaneously once for moderate to severe anaphylaxis, nurse to call physician and pharmacy when reaction occurs and call 911 if needed for immediate care   Diphenhydramine 50mg60mIV vial: Administer 25-50mg 876mM PRN for first dose reaction, rash, itching, mild reaction, nurse to call physician and pharmacy when reaction occurs   Sodium Chloride 0.9% NS 500ml I31mdminister if needed for hypovolemic blood pressure drop or as ordered by physician after call to physician  with anaphylactic reaction   Change dressing on IV access line weekly and PRN   Complete by: As directed    Diet - low sodium heart healthy   Complete by: As directed    Discharge wound care:   Complete by: As directed    See above recommendation   Flush IV access with Sodium Chloride 0.9% and Heparin 10 units/ml or 100 units/ml   Complete by: As directed    Home infusion instructions - [facility]   Complete by: As directed    Instructions: Flush IV access with Sodium Chloride 0.9% and Heparin 10units/ml or 100units/ml   Change dressing on IV access line: Weekly and PRN   Instructions Cath Flo 2mg: Ad85mister for PICC Line occlusion and as ordered by physician for other access device   [facility] pharmacist to adjust dose for: Vancomycin, Aminoglycosides and other anti-infective therapies as requested by physician   Increase activity slowly   Complete by: As directed    Method of administration may be changed at the discretion of home infusion pharmacist based upon assessment of the patient and/or caregiver's ability to self-administer the medication ordered   Complete by: As directed      Allergies as of 06/14/2020   No Known Allergies     Medication List    STOP taking these medications   aspirin 325 MG tablet Replaced by: aspirin EC 81 MG tablet     TAKE these medications   albuterol 108 (90 Base) MCG/ACT inhaler Commonly known as: VENTOLIN HFA Inhale 2 puffs into the lungs every 6 (six) hours as needed for wheezing or  shortness of breath.   allopurinol 300 MG tablet Commonly known as: ZYLOPRIM Take 300 mg by mouth daily.   amLODipine 5 MG tablet Commonly known as: NORVASC Take 5 mg by mouth daily.   ampicillin  IVPB Inject 2 g into the vein every 12 (twelve) hours. 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   apixaban 5 MG Tabs tablet Commonly known as: ELIQUIS Take 2 tablets (10 mg total) by mouth 2 (two) times daily for 5 days.   apixaban 5 MG Tabs tablet Commonly known as: ELIQUIS Take 1 tablet (5 mg total) by mouth 2 (two) times daily. Start taking on: June 18, 2020   aspirin EC 81 MG tablet Take 1 tablet (81 mg total) by mouth daily. Swallow whole. Replaces: aspirin 325 MG tablet   calcitRIOL 0.25 MCG capsule Commonly known as: ROCALTROL Take 0.25 mcg by mouth every other day.   Calcium-Magnesium-Zinc-D3 Tabs Take 1 tablet by mouth daily.   cefTRIAXone  IVPB Commonly known as: ROCEPHIN Inject 2 g into the vein every 12 (twelve) hours. 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 Notes to patient: Next dose 10pm   finasteride 5 MG tablet Commonly known as: PROSCAR Take 5 mg by mouth daily.   FISH OIL PO Take 2 capsules by mouth daily.   fluticasone 50 MCG/ACT nasal spray Commonly known as: FLONASE Place 1 spray into both nostrils daily.   furosemide 40 MG tablet Commonly known as: LASIX Take 40 mg by mouth 2 (two) times daily.   hydrALAZINE 25 MG tablet Commonly known as: APRESOLINE Take 25 mg by mouth 3 (three) times daily.   L-Lysine 500 MG Tabs Take 500 mg by mouth daily.   levothyroxine 175 MCG tablet Commonly known as: SYNTHROID Take 175 mcg by  mouth daily before breakfast.   metoprolol tartrate 25 MG tablet Commonly known as: LOPRESSOR Take 25 mg by mouth 2 (two) times daily.   multivitamin tablet Take 1 tablet by mouth daily.   oxyCODONE-acetaminophen 5-325 MG tablet Commonly known as: Percocet Take 1 tablet by mouth every 6 (six) hours as needed for severe pain.   pantoprazole 40 MG tablet Commonly known as: PROTONIX Take 40 mg by mouth daily.   polyethylene glycol 17 g packet Commonly known as: MIRALAX / GLYCOLAX Take 17 g by mouth daily.   predniSONE 20 MG tablet Commonly known as: DELTASONE Take 2 tablets (40 mg total) by mouth daily for 2 days.   Renal 1 MG Caps Take 1 capsule by mouth daily.   senna-docusate 8.6-50 MG tablet Commonly known as: Senokot-S Take 1 tablet by mouth 2 (two) times daily.   sertraline 100 MG tablet Commonly known as: ZOLOFT Take 100 mg by mouth daily.   simvastatin 40 MG tablet Commonly known as: ZOCOR Take 40 mg by mouth every evening.  sodium bicarbonate 650 MG tablet Take 2 tablets (1,300 mg total) by mouth 3 (three) times daily with meals. What changed:   how much to take  when to take this   Travoprost (BAK Free) 0.004 % Soln ophthalmic solution Commonly known as: TRAVATAN Place 1 drop into both eyes at bedtime.   Vitamin D3 50 MCG (2000 UT) capsule Take 2,000-4,000 Units by mouth See admin instructions. Take 4000 units by mouth in the morning and 2000 units in the evening            Durable Medical Equipment  (From admission, onward)         Start     Ordered   06/14/20 0732  For home use only DME oxygen  Once       Question Answer Comment  Length of Need 6 Months   Mode or (Route) Nasal cannula   Liters per Minute 2   Frequency Continuous (stationary and portable oxygen unit needed)   Oxygen delivery system Gas      06/14/20 0731           Discharge Care Instructions  (From admission, onward)         Start     Ordered   06/14/20  0000  Change dressing on IV access line weekly and PRN  (Home infusion instructions - [facility] )        06/14/20 0854   06/14/20 0000  Discharge wound care:       Comments: See above recommendation   06/14/20 1412          Follow-up Information    [facility] Follow up.   Why: the office will call to schedule physical therapy visits Contact information: [address]  [place], [place] [address] [phone]       [facility]. Call.   Why: for questions about home oxygen Contact information: [phone]       [facility]. Call.   Why: for questions about IV antibiotics  Contact information: [address] [place], [place] [address] [phone] Toll-Free:([phone] Fax:([phone]             No Known Allergies  Consultations: Nephrology   Procedures/Studies: DG Chest 1 View  Result Date: 06/10/2020 CLINICAL DATA:  74 year old male with history of pneumonia, shortness of breath, fever. EXAM: CHEST  1 VIEW COMPARISON:  06/08/2020 FINDINGS: The radiograph is under exposed. Unchanged cardiomegaly. Atherosclerotic calcification of the aortic arch. Global decrease in previously visualized patchy diffuse pulmonary opacities. No significant pleural effusions. No pneumothorax. No new consolidative opacities. Median sternotomy wires remain intact. No acute osseous abnormality. IMPRESSION: Interval decrease in previously visualized diffuse patchy pulmonary opacities suggestive of improving pulmonary edema versus improving multifocal pneumonia. Unchanged cardiomegaly. Electronically Signed   By: Ruthann Cancer MD   On: 06/10/2020 08:16   NM Pulmonary Perfusion  Result Date: 06/10/2020 CLINICAL DATA:  Elevated D-dimer. Hypoxia. Question pulmonary emboli. EXAM: NUCLEAR MEDICINE PERFUSION LUNG SCAN TECHNIQUE: Perfusion images were obtained in multiple projections after intravenous injection of  radiopharmaceutical. Ventilation scans intentionally deferred if perfusion scan and chest x-ray adequate for interpretation during COVID 19 epidemic. RADIOPHARMACEUTICALS:  4.3 mCi Tc-21mMAA IV COMPARISON:  Chest radiography same day FINDINGS: Bilateral scattered segmental to subsegmental perfusion defects, most notable in the mid and lower right lung, worrisome for pulmonary emboli. There is no chest radiographic abnormality to explain this. Ventilation study no longer performed. IMPRESSION: Bilateral scattered segmental  to subsegmental perfusion defects right more than left worrisome for pulmonary emboli. Electronically Signed   By: Nelson Chimes M.D.   On: 06/10/2020 13:59   IR US Guide Vasc Access Right  Result Date: 06/13/2020 INDICATION: 74 year old male referred for tunneled catheter placement EXAM: IMAGE GUIDED TUNNELED CENTRAL VENOUS CATHETER PLACEMENT MEDICATIONS: None. ANESTHESIA/SEDATION: None. FLUOROSCOPY TIME:  Fluoroscopy Time: 0 minutes 6 seconds (6 mGy). COMPLICATIONS: None PROCEDURE: Informed written consent was obtained from the patient after a thorough discussion of the procedural risks, benefits and alternatives. All questions were addressed. Maximal Sterile Barrier Technique was utilized including caps, mask, sterile gowns, sterile gloves, sterile drape, hand hygiene and skin antiseptic. A timeout was performed prior to the initiation of the procedure. After written informed consent was obtained, patient was placed in the supine position on angiographic table. Patency of the right internal jugular vein was confirmed with ultrasound with image documentation. Patient was prepped and draped in the usual sterile fashion including the right neck and right superior chest. Using ultrasound guidance, the skin and subcutaneous tissues overlying the right internal jugular vein were generously infiltrated with 1% lidocaine without epinephrine. Using ultrasound guidance, the right internal jugular  vein was punctured with a micropuncture needle, and an 018 wire was advanced into the right heart confirming venous access. A small stab incision was made with an 11 blade scalpel. Peel-away sheath was placed over the wire, and then the wire was removed, marking the wire for estimation of internal catheter length. The chest wall was then generously infiltrated with 1% lidocaine for local anesthesia along the tissue tract. Small stab incision was made with 11 blade scalpel, and then the catheter was back tunneled to the puncture site at the right internal jugular vein. Catheter was pulled through the tract, with the catheter amputated at 25 cm. Catheter was advanced through the peel-away sheath, and the peel-away sheath was removed. Final image was stored. The catheter was anchored to the chest wall with 2 retention sutures, and Derma bond was used to seal the right internal jugular vein incision site and at the right chest wall. Patient tolerated the procedure well and remained hemodynamically stable throughout. No complications were encountered and no significant blood loss was encountered. IMPRESSION: Status post image guided placement of tunneled cuffed central venous catheter. Signed, Dulcy Fanny. Dellia Nims, RPVI [facility] Electronically Signed   By: Corrie Mckusick D.O.   On: 06/13/2020 14:01   DG Chest Portable 1 View  Result Date: 06/08/2020 CLINICAL DATA:  74 year old male with shortness of breath and chest pain EXAM: PORTABLE CHEST 1 VIEW COMPARISON:  08/05/2012 FINDINGS: Mild cardiomegaly, unchanged. Atherosclerotic calcification of the aortic arch. 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 Scattered diffuse patchy pulmonary opacities with a basal predominance. No significant pleural effusion. No pneumothorax. Median sternotomy wires remain in place. No acute osseous abnormality. IMPRESSION: Diffuse bilateral mid lung and lower lobe predominant patchy  pulmonary opacities, most compatible with multifocal pneumonia in the setting of fever. Pulmonary edema could appear similarly. Electronically Signed   By: Ruthann Cancer MD   On: 06/08/2020 14:10   ECHOCARDIOGRAM COMPLETE  Result Date: 06/10/2020    ECHOCARDIOGRAM REPORT   Patient Name:   [name] Date of Exam: 06/10/2020 Medical Rec #:  [id number]     Height:       69.0 in Accession #:    [id number]    Weight:       264.8 lb Date of Birth:  [birth year]     BSA:          2.328 m Patient Age:    74 years      BP:           113/61 mmHg Patient Gender: M             HR:           88 bpm. Exam Location:  Inpatient Procedure: 2D Echo, 3D Echo, Strain Analysis, Color Doppler and Cardiac Doppler Indications:     Bacteremia 790.7 / R78.81  History:         Patient has prior history of Echocardiogram examinations, most                  recent 07/07/2017. CAD, Prior CABG, COPD,                  Signs/Symptoms:Shortness of Breath; Risk Factors:Former Smoker,                  Hypertension and Dyslipidemia. Acute exacerbation of congestive                  heart failure, Enterococcus bacteremia, End stage renal                  disease, thyroid disease.                  Aortic Valve: 25 mm Edwards MAGNA EASE valve is present in the                  aortic position. Procedure Date: 07/11/2012.  Sonographer:     Darlina Sicilian RDCS Referring Phys:  [id number] [place] T VU Diagnosing Phys: Loralie Champagne MD IMPRESSIONS  1. Left ventricular ejection fraction, by estimation, is 60 to 65%. The left ventricle has normal function. The left ventricle has no regional wall motion abnormalities. There is moderate left ventricular hypertrophy. Left ventricular diastolic parameters are consistent with Grade II diastolic dysfunction (pseudonormalization).  2. Right ventricular systolic function is mildly reduced. The right ventricular size is mildly enlarged. There is severely elevated pulmonary artery systolic pressure. The estimated right  ventricular systolic pressure is 58.3 mmHg.  3. Left atrial size was mild to moderately dilated.  4. Right atrial size was mildly dilated.  5. The mitral valve is degenerative. Mild to moderate mitral valve regurgitation. Mild to moderate mitral stenosis, mean gradient 11 mmHg but MVA 2.3 cm^2 by PHT. Suspect elevated gradient related in part to high flow. Moderate to severe mitral annular calcification.  6. Bioprosthetic aortic valve. Mean gradient 28 mmHg, moderately elevated. No significant regurgitation.  7. Tricuspid valve regurgitation is moderate.  8. The inferior vena cava is dilated in size with <50% respiratory variability, suggesting right atrial pressure of 15 mmHg.  9. Neither the bioprosthetic aortic valve nor the heavily calcified mitral valve/annulus were visualized well enough to rule out endocarditis. Suggest TEE. FINDINGS  Left Ventricle: Left ventricular ejection fraction, by estimation, is 60 to 65%. The left ventricle has normal function. The left ventricle has no regional wall motion abnormalities. The left ventricular internal cavity size was normal in size. There is  moderate left ventricular hypertrophy. Left ventricular diastolic parameters are consistent with Grade II diastolic dysfunction (pseudonormalization). Right Ventricle: The right ventricular size is mildly enlarged. No increase in right ventricular wall thickness. Right ventricular systolic function is mildly reduced. There is severely elevated pulmonary artery systolic pressure. The tricuspid regurgitant velocity is 3.47  m/s, and with an assumed right atrial pressure of 15 mmHg, the estimated right ventricular systolic pressure is 14.2 mmHg. Left Atrium: Left atrial size was mild to moderately dilated. Right Atrium: Right atrial size was mildly dilated. Pericardium: There is no evidence of pericardial effusion. Mitral Valve: The mitral valve is degenerative in appearance. There is mild calcification of the mitral valve  leaflet(s). Moderate to severe mitral annular calcification. Mild to moderate mitral valve regurgitation. Mild to moderate mitral valve stenosis. MV peak gradient, 21.3 mmHg. The mean mitral valve gradient is 11.0 mmHg. Tricuspid Valve: The tricuspid valve is normal in structure. Tricuspid valve regurgitation is moderate. Aortic Valve: Bioprosthetic aortic valve. Mean gradient 28 mmHg, moderately elevated. No significant regurgitation. The aortic valve has been repaired/replaced. Aortic valve regurgitation is not visualized. Aortic valve mean gradient measures 28.0 mmHg. Aortic valve peak gradient measures 43.8 mmHg. Aortic valve area, by VTI measures 2.02 cm. There is a 25 mm Edwards MAGNA EASE valve present in the aortic position. Procedure Date: 07/11/2012. Pulmonic Valve: The pulmonic valve was normal in structure. Pulmonic valve regurgitation is trivial. Aorta: The aortic root is normal in size and structure. Venous: The inferior vena cava is dilated in size with less than 50% respiratory variability, suggesting right atrial pressure of 15 mmHg. IAS/Shunts: No atrial level shunt detected by color flow Doppler.  LEFT VENTRICLE PLAX 2D LVIDd:         5.00 cm  Diastology LVIDs:         3.10 cm  LV e' medial:    5.55 cm/s LV PW:         1.40 cm  LV E/e' medial:  36.6 LV IVS:        1.80 cm  LV e' lateral:   7.29 cm/s LVOT diam:     2.70 cm  LV E/e' lateral: 27.8 LV SV:         134 LV SV Index:   58 LVOT Area:     5.73 cm                          3D Volume EF:                         3D EF:        63 %                         LV EDV:       322 ml                         LV ESV:       118 ml                         LV SV:        204 ml RIGHT VENTRICLE RV S prime:     10.00 cm/s TAPSE (M-mode): 1.6 cm LEFT ATRIUM             Index       RIGHT ATRIUM           Index LA diam:        5.30 cm 2.28 cm/m  RA Area:     21.60 cm LA Vol (A2C):   93.3 ml 40.08 ml/m RA Volume:   59.40 ml  25.52 ml/m LA  Vol (A4C):   83.6 ml  35.92 ml/m LA Biplane Vol: 92.5 ml 39.74 ml/m  AORTIC VALVE AV Area (Vmax):    1.87 cm AV Area (Vmean):   1.87 cm AV Area (VTI):     2.02 cm AV Vmax:           331.00 cm/s AV Vmean:          231.333 cm/s AV VTI:            0.665 m AV Peak Grad:      43.8 mmHg AV Mean Grad:      28.0 mmHg LVOT Vmax:         108.00 cm/s LVOT Vmean:        75.400 cm/s LVOT VTI:          0.234 m LVOT/AV VTI ratio: 0.35  AORTA Ao Asc diam: 3.50 cm MITRAL VALVE                TRICUSPID VALVE MV Area (PHT): 5.02 cm     TR Peak grad:   48.2 mmHg MV Peak grad:  21.3 mmHg    TR Vmax:        347.00 cm/s MV Mean grad:  11.0 mmHg MV Vmax:       2.31 m/s     SHUNTS MV Vmean:      149.5 cm/s   Systemic VTI:  0.23 m MV Decel Time: 151 msec     Systemic Diam: 2.70 cm MR Peak grad: 96.4 mmHg MR Mean grad: 68.0 mmHg MR Vmax:      491.00 cm/s MR Vmean:     401.0 cm/s MV E velocity: 203.00 cm/s MV A velocity: 187.00 cm/s MV E/A ratio:  1.09 Loralie Champagne MD Electronically signed by Loralie Champagne MD Signature Date/Time: 06/10/2020/2:33:55 PM    Final (Updated)    ECHO TEE  Result Date: 06/12/2020    TRANSESOPHOGEAL ECHO REPORT   Patient Name:   [name] Date of Exam: 06/12/2020 Medical Rec #:  [id number]     Height:       69.0 in Accession #:    [id number]    Weight:       283.7 lb Date of Birth:  [birth year]     BSA:          2.397 m Patient Age:    60 years      BP:           128/60 mmHg Patient Gender: M             HR:           70 bpm. Exam Location:  Inpatient Procedure: Transesophageal Echo, Cardiac Doppler and Color Doppler Indications:     Enterococcal bacteremia  History:         Patient has prior history of Echocardiogram examinations, most                  recent 06/10/2020.                  Aortic Valve: bioprosthetic valve is present in the aortic                  position.  Sonographer:     Philipp Deputy Referring Phys:  [id number] Leanor Kail Diagnosing Phys: Skeet Latch MD PROCEDURE: After discussion of the  risks and benefits of a TEE, an informed consent was obtained from the patient. The transesophogeal probe was passed without  difficulty through the esophogus of the patient. Sedation performed by different physician. Image quality was adequate. The patient's vital signs; including heart rate, blood pressure, and oxygen saturation; remained stable throughout the procedure. The patient developed no complications during the procedure. IMPRESSIONS  1. Left ventricular ejection fraction, by estimation, is 60 to 65%. The left ventricle has normal function. The left ventricle has no regional wall motion abnormalities.  2. Right ventricular systolic function is normal. The right ventricular size is normal.  3. Left atrial size was mildly dilated. No left atrial/left atrial appendage thrombus was detected.  4. Right atrial size was mildly dilated.  5. Multiple regurgitant jets. The mitral valve is degenerative. There is a mobile density on the anterior mitral valve leaflet that is poorly visualized but seems to have also been present on the TTE 07/07/2017. The mitral valve is degenerative. Mild mitral valve regurgitation. Mild to moderate mitral stenosis. Moderate to severe mitral annular calcification.  6. Tricuspid valve regurgitation is moderate to severe.  7. 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 Aortic valve gradients are moderately elevated, unchanged from 2019. There is a fixed, calcified density in the LVOT unchanged from 2019. The aortic valve has been repaired/replaced. Aortic valve regurgitation is not visualized. Moderate aortic valve stenosis. 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 Aortic valve mean gradient measures 28.0 mmHg. Aortic valve Vmax measures 3.52 m/s. Conclusion(s)/Recommendation(s): No evidence of vegetation/infective endocarditis on this transesophageal echocardiogram. FINDINGS  Left Ventricle: Left ventricular ejection fraction, by estimation, is 60  to 65%. The left ventricle has normal function. The left ventricle has no regional wall motion abnormalities. The left ventricular internal cavity size was normal in size. There is  no left ventricular hypertrophy. Right Ventricle: The right ventricular size is normal. No increase in right ventricular wall thickness. Right ventricular systolic function is normal. Left Atrium: Left atrial size was mildly dilated. No left atrial/left atrial appendage thrombus was detected. Right Atrium: Right atrial size was mildly dilated. Pericardium: There is no evidence of pericardial effusion. Mitral Valve: Multiple regurgitant jets. The mitral valve is degenerative. There is a mobile density on the anterior mitral valve leaflet that is poorly visualized but seems to have also been present on the TTE 07/07/2017. The mitral valve is degenerative in appearance. Moderate to severe mitral annular calcification. Mild mitral valve regurgitation. Mild to moderate mitral valve stenosis. MV peak gradient, 21.2 mmHg. The mean mitral valve gradient is 8.0 mmHg. Tricuspid Valve: The tricuspid valve is normal in structure. Tricuspid valve regurgitation is moderate to severe. No evidence of tricuspid stenosis. Aortic Valve: Bioprosthetic aortic valve leaftlets are thin and moving well. Aortic valve gradients are moderately elevated, unchanged from 2019. There is a fixed, calcified density in the LVOT unchanged from 2019. The aortic valve has been repaired/replaced. Aortic valve regurgitation is not visualized. Moderate aortic stenosis is present. Aortic valve mean gradient measures 28.0 mmHg. Aortic valve peak gradient measures 49.6 mmHg. 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 Pulmonic Valve: The pulmonic valve was normal in structure. Pulmonic valve regurgitation is not visualized. No evidence of pulmonic stenosis. Aorta: The aortic root is normal in size and structure. IAS/Shunts: No atrial level shunt detected by color  flow Doppler.  LEFT VENTRICLE PLAX 2D LVOT diam:     2.20 cm LVOT Area:     3.80 cm  AORTIC VALVE AV Vmax:      352.00 cm/s AV Vmean:     250.000  cm/s AV VTI:       0.845 m AV Peak Grad: 49.6 mmHg AV Mean Grad: 28.0 mmHg MITRAL VALVE MV Peak grad: 21.2 mmHg      SHUNTS MV Mean grad: 8.0 mmHg       Systemic Diam: 2.20 cm MV Vmax:      2.30 m/s MV Vmean:     136.0 cm/s MR Peak grad:    107.3 mmHg MR Mean grad:    73.0 mmHg MR Vmax:         518.00 cm/s MR Vmean:        400.0 cm/s MR PISA:         0.57 cm MR PISA Eff ROA: 4 mm MR PISA Radius:  0.30 cm Skeet Latch MD Electronically signed by Skeet Latch MD Signature Date/Time: 06/12/2020/3:56:32 PM    Final    IR TUNNELED CENTRAL VENOUS CATHETER PLACEMENT  Result Date: 06/13/2020 INDICATION: 74 year old male referred for tunneled catheter placement EXAM: IMAGE GUIDED TUNNELED CENTRAL VENOUS CATHETER PLACEMENT MEDICATIONS: None. ANESTHESIA/SEDATION: None. FLUOROSCOPY TIME:  Fluoroscopy Time: 0 minutes 6 seconds (6 mGy). COMPLICATIONS: None PROCEDURE: Informed written consent was obtained from the patient after a thorough discussion of the procedural risks, benefits and alternatives. All questions were addressed. Maximal Sterile Barrier Technique was utilized including caps, mask, sterile gowns, sterile gloves, sterile drape, hand hygiene and skin antiseptic. A timeout was performed prior to the initiation of the procedure. After written informed consent was obtained, patient was placed in the supine position on angiographic table. Patency of the right internal jugular vein was confirmed with ultrasound with image documentation. Patient was prepped and draped in the usual sterile fashion including the right neck and right superior chest. Using ultrasound guidance, the skin and subcutaneous tissues overlying the right internal jugular vein were generously infiltrated with 1% lidocaine without epinephrine. Using ultrasound guidance, the right internal  jugular vein was punctured with a micropuncture needle, and an 018 wire was advanced into the right heart confirming venous access. A small stab incision was made with an 11 blade scalpel. Peel-away sheath was placed over the wire, and then the wire was removed, marking the wire for estimation of internal catheter length. The chest wall was then generously infiltrated with 1% lidocaine for local anesthesia along the tissue tract. Small stab incision was made with 11 blade scalpel, and then the catheter was back tunneled to the puncture site at the right internal jugular vein. Catheter was pulled through the tract, with the catheter amputated at 25 cm. Catheter was advanced through the peel-away sheath, and the peel-away sheath was removed. Final image was stored. The catheter was anchored to the chest wall with 2 retention sutures, and Derma bond was used to seal the right internal jugular vein incision site and at the right chest wall. Patient tolerated the procedure well and remained hemodynamically stable throughout. No complications were encountered and no significant blood loss was encountered. IMPRESSION: Status post image guided placement of tunneled cuffed central venous catheter. Signed, Dulcy Fanny. Dellia Nims, RPVI [facility] Electronically Signed   By: Corrie Mckusick D.O.   On: 06/13/2020 14:01    Subjective: He couldn't sleep last night. Denies dyspnea.   Discharge Exam: Vitals:   06/14/20 1010 06/14/20 1016  BP: (!) 135/110 (!) 146/68  Pulse: 68 68  Resp: 16   Temp: 97.9 F (36.6 C)   SpO2: 97% 100%     General: Pt is alert, awake, not  in acute distress Cardiovascular: RRR, S1/S2 +, no rubs, no gallops Respiratory: CTA bilaterally, no wheezing, no rhonchi Abdominal: Soft, NT, ND, bowel sounds + Extremities: no edema, no cyanosis    The results of significant diagnostics from this hospitalization (including imaging,  microbiology, ancillary and laboratory) are listed below for reference.     Microbiology: Recent Results (from the past 240 hour(s))  Blood Culture (routine x 2)     Status: Abnormal   Collection Time: 06/08/20  4:21 PM   Specimen: BLOOD  Result Value Ref Range Status   Specimen Description   Final    BLOOD Performed at [facility], [address] [place], [place] [address]    Special Requests   Final    NONE Performed at [facility], [address] [place], [place] [address]    Culture  Setup Time   Final    GRAM POSITIVE COCCI Gram Stain Report Called to,Read Back By and Verified With: HEATHER CRAWFORD,RN '@[id number]'  06/09/2020 KAY IN BOTH AEROBIC AND ANAEROBIC BOTTLES    Culture (A)  Final    ENTEROCOCCUS FAECALIS SUSCEPTIBILITIES PERFORMED ON PREVIOUS CULTURE WITHIN THE LAST 5 DAYS. Performed at [facility], [place] [address] [place], [place] [address]    Report Status 06/11/2020 FINAL  Final  Blood Culture (routine x 2)     Status: Abnormal   Collection Time: 06/08/20  4:26 PM   Specimen: BLOOD  Result Value Ref Range Status   Specimen Description   Final    BLOOD Performed at [facility], [address], [place], [place] [address]    Special Requests   Final    NONE Performed at [facility], [address], [place], [place] [address]    Culture  Setup Time   Final    GRAM POSITIVE COCCI ANAEROBIC BOTTLE ONLY Gram Stain Report Called to,Read Back By and Verified With: HEATHER CRAWFORD,RN '@[id number]'  06/09/2020 KAY GRAM POSITIVE COCCI AEROBIC BOTTLE ONLY Organism ID to follow CRITICAL RESULT CALLED TO, READ BACK BY AND VERIFIED WITH: Alycia Patten [id number] MLM Performed at [facility], [place] [address], [place], [place] [address]    Culture ENTEROCOCCUS FAECALIS (A)  Final   Report Status 06/11/2020 FINAL  Final   Organism ID, Bacteria ENTEROCOCCUS FAECALIS  Final      Susceptibility   Enterococcus faecalis - MIC*    AMPICILLIN <=2 SENSITIVE Sensitive      VANCOMYCIN 1 SENSITIVE Sensitive     GENTAMICIN SYNERGY SENSITIVE Sensitive     * ENTEROCOCCUS FAECALIS  Blood Culture ID Panel (Reflexed)     Status: Abnormal   Collection Time: 06/08/20  4:26 PM  Result Value Ref Range Status   Enterococcus faecalis DETECTED (A) NOT DETECTED Final    Comment: CRITICAL RESULT CALLED TO, READ BACK BY AND VERIFIED WITH: PHARMD K PIERCE [id number] MLM    Enterococcus Faecium NOT DETECTED NOT DETECTED Final   Listeria monocytogenes NOT DETECTED NOT DETECTED Final   Staphylococcus species NOT DETECTED NOT DETECTED Final   Staphylococcus aureus (BCID) NOT DETECTED NOT DETECTED Final   Staphylococcus epidermidis NOT DETECTED NOT DETECTED Final   Staphylococcus lugdunensis NOT DETECTED NOT DETECTED Final   Streptococcus species NOT DETECTED NOT DETECTED Final   Streptococcus agalactiae NOT DETECTED NOT DETECTED Final   Streptococcus pneumoniae NOT DETECTED NOT DETECTED Final   Streptococcus pyogenes NOT DETECTED NOT DETECTED Final   A.calcoaceticus-baumannii NOT DETECTED NOT DETECTED Final   Bacteroides fragilis NOT DETECTED NOT  DETECTED Final   Enterobacterales NOT DETECTED NOT DETECTED Final   Enterobacter cloacae complex NOT DETECTED NOT DETECTED Final   Escherichia coli NOT DETECTED NOT DETECTED Final   Klebsiella aerogenes NOT DETECTED NOT DETECTED Final   Klebsiella oxytoca NOT DETECTED NOT DETECTED Final   Klebsiella pneumoniae NOT DETECTED NOT DETECTED Final   Proteus species NOT DETECTED NOT DETECTED Final   Salmonella species NOT DETECTED NOT DETECTED Final   Serratia marcescens NOT DETECTED NOT DETECTED Final   Haemophilus influenzae NOT DETECTED NOT DETECTED Final   Neisseria meningitidis NOT DETECTED NOT DETECTED Final   Pseudomonas aeruginosa NOT DETECTED NOT DETECTED Final   Stenotrophomonas maltophilia NOT DETECTED NOT DETECTED Final   Candida albicans NOT DETECTED NOT DETECTED Final   Candida auris NOT DETECTED NOT DETECTED Final    Candida glabrata NOT DETECTED NOT DETECTED Final   Candida krusei NOT DETECTED NOT DETECTED Final   Candida parapsilosis NOT DETECTED NOT DETECTED Final   Candida tropicalis NOT DETECTED NOT DETECTED Final   Cryptococcus neoformans/gattii NOT DETECTED NOT DETECTED Final   Vancomycin resistance NOT DETECTED NOT DETECTED Final    Comment: Performed at [facility], [place] [address], [place], [place] [address]  Resp Panel by RT-PCR (Flu A&B, Covid) Nasopharyngeal Swab     Status: None   Collection Time: 06/08/20  5:05 PM   Specimen: Nasopharyngeal Swab; Nasopharyngeal(NP) swabs in vial transport medium  Result Value Ref Range Status   SARS Coronavirus 2 by RT PCR NEGATIVE NEGATIVE Final    Comment: (NOTE) SARS-CoV-2 target nucleic acids are NOT DETECTED.  The SARS-CoV-2 RNA is generally detectable in upper respiratory specimens during the acute phase of infection. The lowest concentration of SARS-CoV-2 viral copies this assay can detect is 138 copies/mL. A negative result does not preclude SARS-Cov-2 infection and should not be used as the sole basis for treatment or other patient management decisions. A negative result may occur with  improper specimen collection/handling, submission of specimen other than nasopharyngeal swab, presence of viral mutation(s) within the areas targeted by this assay, and inadequate number of viral copies(<138 copies/mL). A negative result must be combined with clinical observations, patient history, and epidemiological information. The expected result is Negative.  Fact Sheet for Patients:  EntrepreneurPulse.com.au  Fact Sheet for Healthcare Providers:  IncredibleEmployment.be  This test is no t yet approved or cleared by the [place] FDA and  has been authorized for detection and/or diagnosis of SARS-CoV-2 by FDA under an Emergency Use Authorization (EUA). This EUA will remain  in effect (meaning this test  can be used) for the duration of the COVID-19 declaration under Section 564(b)(1) of the Act, 21 U.S.C.section 360bbb-3(b)(1), unless the authorization is terminated  or revoked sooner.       Influenza A by PCR NEGATIVE NEGATIVE Final   Influenza B by PCR NEGATIVE NEGATIVE Final    Comment: (NOTE) The Xpert Xpress SARS-CoV-2/FLU/RSV plus assay is intended as an aid in the diagnosis of influenza from Nasopharyngeal swab specimens and should not be used as a sole basis for treatment. Nasal washings and aspirates are unacceptable for Xpert Xpress SARS-CoV-2/FLU/RSV testing.  Fact Sheet for Patients: EntrepreneurPulse.com.au  Fact Sheet for Healthcare Providers: IncredibleEmployment.be  This test is not yet approved or cleared by the [place] FDA and has been authorized for detection and/or diagnosis of SARS-CoV-2 by FDA under an Emergency Use Authorization (EUA). This EUA will remain in effect (meaning this test can be used) for the duration of  the COVID-19 declaration under Section 564(b)(1) of the Act, 21 U.S.C. section 360bbb-3(b)(1), unless the authorization is terminated or revoked.  Performed at [facility], [address] [place], [place] [address]   Culture, body fluid-bottle     Status: None (Preliminary result)   Collection Time: 06/10/20  3:44 AM   Specimen: Peritoneal Dialysate  Result Value Ref Range Status   Specimen Description PERITONEAL DIALYSATE FLUID  Final   Special Requests   Final    BOTTLES DRAWN AEROBIC AND ANAEROBIC Blood Culture adequate volume   Culture   Final    NO GROWTH 4 DAYS Performed at [facility], [address], [place], [place] [address]    Report Status PENDING  Incomplete  Gram stain     Status: None   Collection Time: 06/10/20  3:44 AM   Specimen: Peritoneal Dialysate  Result Value Ref Range Status   Specimen Description PERITONEAL DIALYSATE FLUID  Final   Special Requests NONE   Final   Gram Stain   Final    WBC PRESENT,BOTH PMN AND MONONUCLEAR NO ORGANISMS SEEN CYTOSPIN SMEAR Performed at [facility], [address] [place], [place] [address]    Report Status 06/10/2020 FINAL  Final  Culture, blood (routine x 2)     Status: None (Preliminary result)   Collection Time: 06/10/20  5:17 AM   Specimen: BLOOD RIGHT HAND  Result Value Ref Range Status   Specimen Description BLOOD RIGHT HAND  Final   Special Requests   Final    BOTTLES DRAWN AEROBIC ONLY Blood Culture adequate volume   Culture   Final    NO GROWTH 4 DAYS Performed at [facility], [place] [address] [place], [place] [address]    Report Status PENDING  Incomplete  Culture, blood (routine x 2)     Status: None (Preliminary result)   Collection Time: 06/10/20  5:17 AM   Specimen: BLOOD RIGHT HAND  Result Value Ref Range Status   Specimen Description BLOOD RIGHT HAND  Final   Special Requests   Final    BOTTLES DRAWN AEROBIC ONLY Blood Culture results may not be optimal due to an inadequate volume of blood received in culture bottles   Culture   Final    NO GROWTH 4 DAYS Performed at [facility], [place] [address], [place],  [address]    Report Status PENDING  Incomplete     Labs: BNP (last 3 results) Recent Labs    06/08/20 1647  BNP 1,886.7*   Basic Metabolic Panel: Recent Labs  Lab 06/09/20 1012 06/10/20 0517 06/11/20 0125 06/12/20 0442 06/13/20 0326  NA 138 139 139 140 140  K 3.5 3.4* 4.1 4.2 3.8  CL 104 104 104 104 103  CO2 17* 17* 19* 20* 20*  GLUCOSE 190* 189* 182* 139* 120*  BUN 79* 90* 105* 119* 119*  CREATININE 6.06* 6.52* 6.85* 7.24* 7.39*  CALCIUM 8.6* 8.7* 8.7* 8.6* 8.6*  MG  --   --  2.2 2.1  --   PHOS  --   --  6.1* 7.7* 7.9*   Liver Function Tests: Recent Labs  Lab 06/08/20 1621 06/09/20 1012 06/11/20 0125 06/12/20 0442 06/13/20 0326  AST 24 27 36  --   --   ALT '14 13 20  ' --   --   ALKPHOS 56 49 48  --   --   BILITOT 0.6 0.6  0.5  --   --  PROT 6.9 6.1* 6.1*  --   --   ALBUMIN 3.1* 2.4* 2.5* 2.5* 2.5*   No results for input(s): LIPASE, AMYLASE in the last 168 hours. No results for input(s): AMMONIA in the last 168 hours. CBC: Recent Labs  Lab 06/08/20 1332 06/09/20 0415 06/10/20 0517 06/11/20 0125 06/12/20 0442 06/13/20 0326 06/14/20 0218  WBC 22.3*   < > 18.5* 19.1* 19.9* 17.0* 16.2*  NEUTROABS 20.2*  --  17.4*  --   --   --   --   HGB 8.8*   < > 7.8* 7.8* 8.2* 8.4* 8.8*  HCT 27.1*   < > 23.9* 24.8* 26.5* 26.9* 26.6*  MCV 98.2   < > 96.4 98.0 99.3 98.5 96.4  PLT 224   < > 213 230 229 223 240   < > = values in this interval not displayed.   Cardiac Enzymes: No results for input(s): CKTOTAL, CKMB, CKMBINDEX, TROPONINI in the last 168 hours. BNP: Invalid input(s): POCBNP CBG: No results for input(s): GLUCAP in the last 168 hours. D-Dimer No results for input(s): DDIMER in the last 72 hours. Hgb A1c No results for input(s): HGBA1C in the last 72 hours. Lipid Profile No results for input(s): CHOL, HDL, LDLCALC, TRIG, CHOLHDL, LDLDIRECT in the last 72 hours. Thyroid function studies No results for input(s): TSH, T4TOTAL, T3FREE, THYROIDAB in the last 72 hours.  Invalid input(s): FREET3 Anemia work up No results for input(s): VITAMINB12, FOLATE, FERRITIN, TIBC, IRON, RETICCTPCT in the last 72 hours. Urinalysis    Component Value Date/Time   COLORURINE YELLOW 06/08/2020 1859   APPEARANCEUR HAZY (A) 06/08/2020 1859   LABSPEC 1.014 06/08/2020 1859   PHURINE 5.0 06/08/2020 1859   GLUCOSEU 50 (A) 06/08/2020 1859   HGBUR NEGATIVE 06/08/2020 1859   BILIRUBINUR NEGATIVE 06/08/2020 1859   KETONESUR NEGATIVE 06/08/2020 1859   PROTEINUR >=300 (A) 06/08/2020 1859   UROBILINOGEN 0.2 07/11/2012 0545   NITRITE NEGATIVE 06/08/2020 1859   LEUKOCYTESUR NEGATIVE 06/08/2020 1859   Sepsis Labs Invalid input(s): PROCALCITONIN,  WBC,  LACTICIDVEN Microbiology Recent Results (from the past 240 hour(s))   Blood Culture (routine x 2)     Status: Abnormal   Collection Time: 06/08/20  4:21 PM   Specimen: BLOOD  Result Value Ref Range Status   Specimen Description   Final    BLOOD Performed at [facility], [address] [place], [place] [address]    Special Requests   Final    NONE Performed at [facility], [address] [place], [place] [address]    Culture  Setup Time   Final    GRAM POSITIVE COCCI Gram Stain Report Called to,Read Back By and Verified With: HEATHER CRAWFORD,RN '@[id number]'  06/09/2020 KAY IN BOTH AEROBIC AND ANAEROBIC BOTTLES    Culture (A)  Final    ENTEROCOCCUS FAECALIS SUSCEPTIBILITIES PERFORMED ON PREVIOUS CULTURE WITHIN THE LAST 5 DAYS. Performed at [facility], [place] [address], [place], [place] [address]    Report Status 06/11/2020 FINAL  Final  Blood Culture (routine x 2)     Status: Abnormal   Collection Time: 06/08/20  4:26 PM   Specimen: BLOOD  Result Value Ref Range Status   Specimen Description   Final    BLOOD Performed at [facility], [address], [place], [place] [address]    Special Requests   Final    NONE Performed at [facility], [address], [place], [place] [address]    Culture  Setup Time  Final    GRAM POSITIVE COCCI ANAEROBIC BOTTLE ONLY Gram Stain Report Called to,Read Back By and Verified With: HEATHER CRAWFORD,RN '@[id number]'  06/09/2020 KAY GRAM POSITIVE COCCI AEROBIC BOTTLE ONLY Organism ID to follow CRITICAL RESULT CALLED TO, READ BACK BY AND VERIFIED WITH: Alycia Patten [id number] MLM Performed at [facility], [place] [address] [place], [place] [address]    Culture ENTEROCOCCUS FAECALIS (A)  Final   Report Status 06/11/2020 FINAL  Final   Organism ID, Bacteria ENTEROCOCCUS FAECALIS  Final      Susceptibility   Enterococcus faecalis - MIC*    AMPICILLIN <=2 SENSITIVE Sensitive     VANCOMYCIN 1 SENSITIVE Sensitive     GENTAMICIN SYNERGY SENSITIVE Sensitive     * ENTEROCOCCUS FAECALIS  Blood Culture ID Panel  (Reflexed)     Status: Abnormal   Collection Time: 06/08/20  4:26 PM  Result Value Ref Range Status   Enterococcus faecalis DETECTED (A) NOT DETECTED Final    Comment: CRITICAL RESULT CALLED TO, READ BACK BY AND VERIFIED WITH: PHARMD K PIERCE [id number] MLM    Enterococcus Faecium NOT DETECTED NOT DETECTED Final   Listeria monocytogenes NOT DETECTED NOT DETECTED Final   Staphylococcus species NOT DETECTED NOT DETECTED Final   Staphylococcus aureus (BCID) NOT DETECTED NOT DETECTED Final   Staphylococcus epidermidis NOT DETECTED NOT DETECTED Final   Staphylococcus lugdunensis NOT DETECTED NOT DETECTED Final   Streptococcus species NOT DETECTED NOT DETECTED Final   Streptococcus agalactiae NOT DETECTED NOT DETECTED Final   Streptococcus pneumoniae NOT DETECTED NOT DETECTED Final   Streptococcus pyogenes NOT DETECTED NOT DETECTED Final   A.calcoaceticus-baumannii NOT DETECTED NOT DETECTED Final   Bacteroides fragilis NOT DETECTED NOT DETECTED Final   Enterobacterales NOT DETECTED NOT DETECTED Final   Enterobacter cloacae complex NOT DETECTED NOT DETECTED Final   Escherichia coli NOT DETECTED NOT DETECTED Final   Klebsiella aerogenes NOT DETECTED NOT DETECTED Final   Klebsiella oxytoca NOT DETECTED NOT DETECTED Final   Klebsiella pneumoniae NOT DETECTED NOT DETECTED Final   Proteus species NOT DETECTED NOT DETECTED Final   Salmonella species NOT DETECTED NOT DETECTED Final   Serratia marcescens NOT DETECTED NOT DETECTED Final   Haemophilus influenzae NOT DETECTED NOT DETECTED Final   Neisseria meningitidis NOT DETECTED NOT DETECTED Final   Pseudomonas aeruginosa NOT DETECTED NOT DETECTED Final   Stenotrophomonas maltophilia NOT DETECTED NOT DETECTED Final   Candida albicans NOT DETECTED NOT DETECTED Final   Candida auris NOT DETECTED NOT DETECTED Final   Candida glabrata NOT DETECTED NOT DETECTED Final   Candida krusei NOT DETECTED NOT DETECTED Final   Candida parapsilosis NOT  DETECTED NOT DETECTED Final   Candida tropicalis NOT DETECTED NOT DETECTED Final   Cryptococcus neoformans/gattii NOT DETECTED NOT DETECTED Final   Vancomycin resistance NOT DETECTED NOT DETECTED Final    Comment: Performed at [facility], [address], [place], [place] [address]  Resp Panel by RT-PCR (Flu A&B, Covid) Nasopharyngeal Swab     Status: None   Collection Time: 06/08/20  5:05 PM   Specimen: Nasopharyngeal Swab; Nasopharyngeal(NP) swabs in vial transport medium  Result Value Ref Range Status   SARS Coronavirus 2 by RT PCR NEGATIVE NEGATIVE Final    Comment: (NOTE) SARS-CoV-2 target nucleic acids are NOT DETECTED.  The SARS-CoV-2 RNA is generally detectable in upper respiratory specimens during the acute phase of infection. The lowest concentration of SARS-CoV-2 viral copies this assay can detect is 138 copies/mL. A  negative result does not preclude SARS-Cov-2 infection and should not be used as the sole basis for treatment or other patient management decisions. A negative result may occur with  improper specimen collection/handling, submission of specimen other than nasopharyngeal swab, presence of viral mutation(s) within the areas targeted by this assay, and inadequate number of viral copies(<138 copies/mL). A negative result must be combined with clinical observations, patient history, and epidemiological information. The expected result is Negative.  Fact Sheet for Patients:  EntrepreneurPulse.com.au  Fact Sheet for Healthcare Providers:  IncredibleEmployment.be  This test is no t yet approved or cleared by the [place] FDA and  has been authorized for detection and/or diagnosis of SARS-CoV-2 by FDA under an Emergency Use Authorization (EUA). This EUA will remain  in effect (meaning this test can be used) for the duration of the COVID-19 declaration under Section 564(b)(1) of the Act, 21 U.S.C.section 360bbb-3(b)(1),  unless the authorization is terminated  or revoked sooner.       Influenza A by PCR NEGATIVE NEGATIVE Final   Influenza B by PCR NEGATIVE NEGATIVE Final    Comment: (NOTE) The Xpert Xpress SARS-CoV-2/FLU/RSV plus assay is intended as an aid in the diagnosis of influenza from Nasopharyngeal swab specimens and should not be used as a sole basis for treatment. Nasal washings and aspirates are unacceptable for Xpert Xpress SARS-CoV-2/FLU/RSV testing.  Fact Sheet for Patients: EntrepreneurPulse.com.au  Fact Sheet for Healthcare Providers: IncredibleEmployment.be  This test is not yet approved or cleared by the [place] FDA and has been authorized for detection and/or diagnosis of SARS-CoV-2 by FDA under an Emergency Use Authorization (EUA). This EUA will remain in effect (meaning this test can be used) for the duration of the COVID-19 declaration under Section 564(b)(1) of the Act, 21 U.S.C. section 360bbb-3(b)(1), unless the authorization is terminated or revoked.  Performed at [facility], [address], [place], [place] [address]   Culture, body fluid-bottle     Status: None (Preliminary result)   Collection Time: 06/10/20  3:44 AM   Specimen: Peritoneal Dialysate  Result Value Ref Range Status   Specimen Description PERITONEAL DIALYSATE FLUID  Final   Special Requests   Final    BOTTLES DRAWN AEROBIC AND ANAEROBIC Blood Culture adequate volume   Culture   Final    NO GROWTH 4 DAYS Performed at [facility], [address], [place], [place] [address]    Report Status PENDING  Incomplete  Gram stain     Status: None   Collection Time: 06/10/20  3:44 AM   Specimen: Peritoneal Dialysate  Result Value Ref Range Status   Specimen Description PERITONEAL DIALYSATE FLUID  Final   Special Requests NONE  Final   Gram Stain   Final    WBC PRESENT,BOTH PMN AND MONONUCLEAR NO ORGANISMS SEEN CYTOSPIN SMEAR Performed at [facility], [address] [place], [place] [address]    Report Status 06/10/2020 FINAL  Final  Culture, blood (routine x 2)     Status: None (Preliminary result)   Collection Time: 06/10/20  5:17 AM   Specimen: BLOOD RIGHT HAND  Result Value Ref Range Status   Specimen Description BLOOD RIGHT HAND  Final   Special Requests   Final    BOTTLES DRAWN AEROBIC ONLY Blood Culture adequate volume   Culture   Final    NO GROWTH 4 DAYS Performed at [facility], [place] [address] [place], [place] [address]    Report Status  PENDING  Incomplete  Culture, blood (routine x 2)     Status: None (Preliminary result)   Collection Time: 06/10/20  5:17 AM   Specimen: BLOOD RIGHT HAND  Result Value Ref Range Status   Specimen Description BLOOD RIGHT HAND  Final   Special Requests   Final    BOTTLES DRAWN AEROBIC ONLY Blood Culture results may not be optimal due to an inadequate volume of blood received in culture bottles   Culture   Final    NO GROWTH 4 DAYS Performed at [facility], [place] [address] [place], [place] [address]    Report Status PENDING  Incomplete     Time coordinating discharge: 40 minutes  SIGNED:   Elmarie Shiley, MD  [facility]

## 2020-06-14 NOTE — TOC Transition Note (Signed)
Transition of Care [facility]) - CM/SW Discharge Note   Patient Details  Name: [name] MRN: [id number] Date of Birth: 19-Dec-1945  Transition of [facility]) CM/SW Contact:  Bartholomew Crews, RN Phone Number: [phone] 06/14/2020, 12:55 PM   Clinical Narrative:     Spoke with patient and spouse at the bedside. Patient will transition home today. Spoke with Pam at [employer] - she will be doing education for IV antibiotics with the couple at 1pm. Engineer, water at [employer]. [place] orders placed by MD. Discussed need for home oxygen. Couple initially asked about [place] - however, [facility] was advised that they are out of network and patient would have a higher copay. [facility] is in-network - oxygen to be delivered to room. Eliquis prescription sent to [facility] for assistance with $10 copay card. Spouse to provide transportation home.   Final next level of care: Home w Home Health Services Barriers to Discharge: No Barriers Identified   Patient Goals and CMS Choice Patient states their goals for this hospitalization and ongoing recovery are:: home with wife and enjoy their camper CMS Medicare.gov Compare Post Acute Care list provided to:: Patient Choice offered to / list presented to : Patient  Discharge Placement                       Discharge Plan and Services In-house Referral: NA Discharge Planning Services: CM Consult Post Acute Care Choice: Home Health          DME Arranged: Oxygen DME Agency: [facility] Date DME Agency Contacted: 06/14/20 Time DME Agency Contacted: 1100 Representative spoke with at DME Agency: [place]: PT,RN,OT [place]: [place] ([place]) Date [place]: 06/14/20 Time [place]: 1255 Representative spoke with at [place]: [place] Determinants of Health ([place]) Interventions     Readmission Risk Interventions No flowsheet data found.

## 2020-06-15 LAB — CULTURE, BLOOD (ROUTINE X 2)
Culture: NO GROWTH
Culture: NO GROWTH
Special Requests: ADEQUATE

## 2020-06-15 LAB — CULTURE, BODY FLUID W GRAM STAIN -BOTTLE
Culture: NO GROWTH
Special Requests: ADEQUATE

## 2020-06-18 ENCOUNTER — Other Ambulatory Visit: Payer: Self-pay | Admitting: *Deleted

## 2020-06-18 DIAGNOSIS — N186 End stage renal disease: Secondary | ICD-10-CM

## 2020-07-01 ENCOUNTER — Other Ambulatory Visit: Payer: Self-pay

## 2020-07-01 ENCOUNTER — Ambulatory Visit (INDEPENDENT_AMBULATORY_CARE_PROVIDER_SITE_OTHER): Payer: Managed Care, Other (non HMO)

## 2020-07-01 ENCOUNTER — Encounter: Payer: Self-pay | Admitting: Vascular Surgery

## 2020-07-01 ENCOUNTER — Ambulatory Visit (INDEPENDENT_AMBULATORY_CARE_PROVIDER_SITE_OTHER): Payer: Managed Care, Other (non HMO) | Admitting: Vascular Surgery

## 2020-07-01 ENCOUNTER — Encounter (HOSPITAL_COMMUNITY): Payer: Managed Care, Other (non HMO)

## 2020-07-01 ENCOUNTER — Encounter: Payer: Self-pay | Admitting: Infectious Disease

## 2020-07-01 VITALS — BP 83/51 | HR 67 | Temp 97.7°F | Resp 16 | Ht 70.0 in | Wt 247.0 lb

## 2020-07-01 DIAGNOSIS — N186 End stage renal disease: Secondary | ICD-10-CM

## 2020-07-01 NOTE — Progress Notes (Signed)
[facility] [place]  Patient name: [name] MRN: [id number] DOB: Oct 30, 1945 Sex: male  REASON FOR VISIT: Follow-up AV fistula creation left radiocephalic on 33/01/2504  HPI: [name] is a 75 y.o. male here today for follow-up.  He underwent uneventful outpatient AV fistula creation at [facility].  His daughter is with him today and reports that several days following this procedure he developed fevers and was admitted initially to [facility] and then transferred to [facility] for evaluation of sepsis.  All cultures were negative except for blood cultures.  He underwent TEE for evaluation of his heart valve.  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  He is currently undergoing peritoneal dialysis at home and is doing well with this.  Current Outpatient Medications  Medication Sig Dispense Refill  . albuterol (PROVENTIL HFA;VENTOLIN HFA) 108 (90 BASE) MCG/ACT inhaler Inhale 2 puffs into the lungs every 6 (six) hours as needed for wheezing or shortness of breath.     . allopurinol (ZYLOPRIM) 300 MG tablet Take 300 mg by mouth daily.    Marland Kitchen amLODipine (NORVASC) 5 MG tablet Take 5 mg by mouth daily.    Marland Kitchen ampicillin IVPB Inject 2 g into the vein every 12 (twelve) hours. 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 78 Units 0  . apixaban (ELIQUIS) 5 MG TABS tablet Take 1 tablet (5 mg total) by mouth 2 (two) times daily. 60 tablet 3  . aspirin EC 81 MG tablet Take 1 tablet (81 mg total) by mouth daily. Swallow whole.  150 tablet 2  . B Complex-C-Folic Acid (RENAL) 1 MG CAPS Take 1 capsule by mouth daily.    . calcitRIOL (ROCALTROL) 0.25 MCG capsule Take 0.25 mcg by mouth every other day.     . cefTRIAXone (ROCEPHIN) IVPB Inject 2 g into the vein every 12 (twelve) hours. 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 79 Units 0  . Cholecalciferol (VITAMIN D3) 50 MCG (2000 UT) capsule Take 2,000-4,000 Units by mouth See admin instructions. Take 4000 units by mouth in the morning and 2000 units in the evening    . finasteride (PROSCAR) 5 MG tablet Take 5 mg by mouth daily.    . fluticasone (FLONASE) 50 MCG/ACT nasal spray Place 1 spray into both nostrils daily.    . furosemide (LASIX) 40 MG tablet Take 40 mg by mouth 2 (two) times daily.    . hydrALAZINE (APRESOLINE) 25 MG tablet Take 25 mg by mouth 3 (three) times daily.    Marland Kitchen L-Lysine 500 MG TABS Take 500 mg by mouth daily.     Marland Kitchen levothyroxine (SYNTHROID) 175 MCG tablet Take 175 mcg by mouth daily before breakfast.     . metoprolol tartrate (LOPRESSOR) 25 MG tablet Take 25 mg by mouth  2 (two) times daily.    . Multiple Minerals-Vitamins (CALCIUM-MAGNESIUM-ZINC-D3) TABS Take 1 tablet by mouth daily.    . Multiple Vitamin (MULTIVITAMIN) tablet Take 1 tablet by mouth daily.    . Omega-3 Fatty Acids (FISH OIL PO) Take 2 capsules by mouth daily.     Marland Kitchen oxyCODONE-acetaminophen (PERCOCET) 5-325 MG tablet Take 1 tablet by mouth every 6 (six) hours as needed for severe pain. 8 tablet 0  . pantoprazole (PROTONIX) 40 MG tablet Take 40 mg by mouth daily.    . polyethylene glycol (MIRALAX / GLYCOLAX) 17 g packet Take 17 g by  mouth daily. 14 each 0  . senna-docusate (SENOKOT-S) 8.6-50 MG tablet Take 1 tablet by mouth 2 (two) times daily. 10 tablet 0  . sertraline (ZOLOFT) 100 MG tablet Take 100 mg by mouth daily.    . simvastatin (ZOCOR) 40 MG tablet Take 40 mg by mouth every evening.    . sodium bicarbonate 650 MG tablet Take 2 tablets (1,300 mg total) by mouth 3 (three) times daily with meals. 90 tablet 2  . Travoprost, BAK Free, (TRAVATAN) 0.004 % SOLN ophthalmic solution Place 1 drop into both eyes at bedtime.    Marland Kitchen apixaban (ELIQUIS) 5 MG TABS tablet Take 2 tablets (10 mg total) by mouth 2 (two) times daily for 5 days. 20 tablet 0   No current facility-administered medications for this visit.     PHYSICAL EXAM: Vitals:   07/01/20 1315  BP: (!) 83/51  Pulse: 67  Resp: 16  Temp: 97.7 F (36.5 C)  TempSrc: Other (Comment)  SpO2: 98%  Weight: 247 lb (112 kg)  Height: '5\' 10"'  (1.778 m)    GENERAL: The patient is a well-nourished male, in no acute distress. The vital signs are documented above. Left wrist incision healing well.  Excellent maturation of his cephalic vein fistula  Duplex today of his fistula reveals patency with no evidence of stenosis.  Diameter is 5 mm in his cephalic vein  MEDICAL ISSUES: Stable status post left radiocephalic fistula creation on 05/30/2020.  Current plan is to continue with peritoneal dialysis and use his fistula for hemodialysis backup as required.  He will see [place] again on an as-needed basis   [name], MD [facility] Tel [phone]

## 2020-07-03 NOTE — Progress Notes (Signed)
Cardiology Office Note    Date:  07/09/2020   ID:  [name], DOB [birth year], MRN [id number]  PCP:  Celene Squibb, MD  Cardiologist: No primary care provider on file. EPS: None  Chief Complaint  Patient presents with  . Hospitalization Follow-up    History of Present Illness:  [name] is a 75 y.o. 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  Last seen in our office 2014 by Dr. Harrington Challenger  Patient was discharged 06/14/2020 after admission with acute hypoxic respiratory failure secondary to volume overload possible pneumonia versus PE V/Q showed bilateral scattered segmental to subsegmental perfusion defects worrisome for PE was treated with heparin followed by Eliquis he also had enterococcal bacteremia and will be treated with for 6 weeks with IV antibiotics.   TEE for bacteremia 06/12/20 exceeded 65%, moderate TR, severe mitral annular calcification with mild MR and mild to moderate mitral stenosis, bioprosthetic aortic valve moderately stenosed no aortic regurgitation, no LAA/LAA thrombus and no evidence of endocarditis.  Patient comes in for f/u. Still very weak on O2. No energy. Has to stop and rest. Edema has improved some. Taking more off with dialysis.  His wife manages the majority of his care.  Still getting IV antibiotics.  Past Medical History:  Diagnosis Date  . Aortic stenosis   . Arthritis   . Atherosclerotic heart disease of native coronary artery without angina pectoris   . CAD (coronary artery disease)   . Chronic obstructive pulmonary disease, unspecified ([place])   . COPD (chronic obstructive pulmonary disease) ([place])   . Depression   . Essential (primary) hypertension   . Gout, unspecified   . Heart murmur   . Hemorrhoid   . Hyperlipidemia, unspecified   . Hypertension   . Hypothyroidism   . Hypothyroidism, unspecified   . Kidney stones   . Major depressive  disorder, single episode, unspecified   . Morbid (severe) obesity due to excess calories ([place])   . Nonrheumatic aortic (valve) stenosis   . Orthostatic hypotension   . Syncope   . Thyroid disease     Past Surgical History:  Procedure Laterality Date  . ANGIOPASTY    . AORTIC VALVE REPLACEMENT  07/11/2012   Procedure: AORTIC VALVE REPLACEMENT (AVR);  Surgeon: Gaye Pollack, MD;  Location: [facility];  Service: Open Heart Surgery;  Laterality: N/A;  . AV FISTULA PLACEMENT Left 05/30/2020   Procedure: ARTERIOVENOUS (AV) FISTULA CREATION LEFT;  Surgeon: [name], MD;  Location: [facility];  Service: Vascular;  Laterality: Left;  . CARDIAC CATHETERIZATION    . CAROTID ENDARTERECTOMY    . CORONARY ARTERY BYPASS GRAFT  07/11/2012   Procedure: CORONARY ARTERY BYPASS GRAFTING (CABG);  Surgeon: Gaye Pollack, MD;  Location: [facility];  Service: Open Heart Surgery;  Laterality: N/A;  CABG x one,  using right leg greater saphenous vein harvested endoscopically  . INTRAOPERATIVE TRANSESOPHAGEAL ECHOCARDIOGRAM  07/11/2012   Procedure: INTRAOPERATIVE TRANSESOPHAGEAL ECHOCARDIOGRAM;  Surgeon: Gaye Pollack, MD;  Location: [facility];  Service: Open Heart Surgery;  Laterality: N/A;  . IR PERC TUN PERIT CATH WO PORT S&I [facility]a [facility]  06/13/2020  . IR US GUIDE VASC ACCESS RIGHT  06/13/2020  . JOINT REPLACEMENT    . TEE WITHOUT CARDIOVERSION N/A 06/12/2020   Procedure: TRANSESOPHAGEAL ECHOCARDIOGRAM (TEE);  Surgeon: Skeet Latch, MD;  Location: [facility];  Service: Cardiovascular;  Laterality: N/A;  Current Medications: Current Meds  Medication Sig  . albuterol (PROVENTIL HFA;VENTOLIN HFA) 108 (90 BASE) MCG/ACT inhaler Inhale 2 puffs into the lungs every 6 (six) hours as needed for wheezing or shortness of breath.   . allopurinol (ZYLOPRIM) 300 MG tablet Take 300 mg by mouth daily.  Marland Kitchen amLODipine (NORVASC) 5 MG tablet Take 5 mg by mouth daily.  Marland Kitchen ampicillin IVPB Inject 2 g into the vein every 12 (twelve) hours.  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  Marland Kitchen apixaban (ELIQUIS) 5 MG TABS tablet Take 1 tablet (5 mg total) by mouth 2 (two) times daily.  Marland Kitchen aspirin EC 81 MG tablet Take 1 tablet (81 mg total) by mouth daily. Swallow whole.  . cefTRIAXone (ROCEPHIN) IVPB Inject 2 g into the vein every 12 (twelve) hours. 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  . Cholecalciferol (VITAMIN D3) 50 MCG (2000 UT) capsule Take 2,000-4,000 Units by mouth See admin instructions. Take 4000 units by mouth in the morning and 2000 units in the evening  . docusate sodium (COLACE) 100 MG capsule Take 100 mg by mouth 2 (two) times daily.  . finasteride (PROSCAR) 5 MG tablet Take 5 mg by mouth daily.  . fluticasone (FLONASE) 50 MCG/ACT nasal spray Place 1 spray into both nostrils daily.  . furosemide (LASIX) 40 MG tablet Take 40 mg by mouth 2 (two) times daily.  Marland Kitchen L-Lysine 500 MG TABS Take 500 mg by mouth daily.   Marland Kitchen levothyroxine (SYNTHROID) 175 MCG tablet Take 175 mcg by mouth daily before breakfast.   . metoprolol tartrate (LOPRESSOR) 25 MG tablet Take 25 mg by mouth 2 (two) times daily.  . Multiple Minerals-Vitamins (CALCIUM-MAGNESIUM-ZINC-D3) TABS Take 1 tablet by mouth daily.  . Multiple  Vitamin (MULTIVITAMIN) tablet Take 1 tablet by mouth daily.  . Omega-3 Fatty Acids (FISH OIL PO) Take 2 capsules by mouth daily.   . pantoprazole (PROTONIX) 40 MG tablet Take 40 mg by mouth daily.  . polyethylene glycol (MIRALAX / GLYCOLAX) 17 g packet Take 17 g by mouth daily.  . potassium chloride SA (KLOR-CON) 20 MEQ tablet Take 20 mEq by mouth 2 (two) times daily.  . sertraline (ZOLOFT) 100 MG tablet Take 100 mg by mouth daily.  . simvastatin (ZOCOR) 40 MG tablet Take 40 mg by mouth every evening.  . sodium bicarbonate 650 MG tablet Take 2 tablets (1,300 mg total) by mouth 3 (three) times daily with meals.  . Travoprost, BAK Free, (TRAVATAN) 0.004 % SOLN ophthalmic solution Place 1 drop into both eyes at bedtime.     Allergies:   Patient has no known allergies.   Social History   Socioeconomic History  . Marital status: Married    Spouse name: Not on file  . Number of children: 1  . Years of education: HS  . Highest  education level: Not on file  Occupational History  . Occupation: Retired   Tobacco Use  . Smoking status: Former Smoker    Packs/day: 3.00    Years: 50.00    Pack years: 150.00    Types: Cigarettes    Quit date: 12/13/2010    Years since quitting: 9.5  . Smokeless tobacco: Former Systems developer  . Tobacco comment: Electronic cigarette...USES INFREQUENTLY NOW  Vaping Use  . Vaping Use: Former  . Start date: 02/01/2013  . Quit date: 06/08/2020  Substance and Sexual Activity  . Alcohol use: Not Currently    Comment: Rare  . Drug use: No  . Sexual activity: Not Currently  Other Topics Concern  . Not on file  Social History Narrative   Drinks about 3 cups of coffee a day, drinks about 2 sundrops a day    Social Determinants of Radio broadcast assistant Strain: Not on file  Food Insecurity: Not on file  Transportation Needs: Not on file  Physical Activity: Not on file  Stress: Not on file  Social Connections: Not on file     Family History:  The patient's  family history includes Heart disease in his father and mother; Hypertension in his father.   ROS:   Please see the history of present illness.    ROS All other systems reviewed and are negative.   PHYSICAL EXAM:   VS:  BP (!) 110/58   Pulse 60   Ht 5' 9" (1.753 m)   Wt 249 lb 9.6 oz (113.2 kg)   SpO2 96%   BMI 36.86 kg/m   Physical Exam  GEN: Obese, in no acute distress  Neck: no JVD, carotid bruits, or masses Cardiac:RRR; 1/6 systolic murmur at the left sternal border Respiratory: Decreased breath sounds at the bases otherwise clear to auscultation bilaterally, normal work of breathing GI: Peritoneal dialysis catheter in place soft, nontender, nondistended, + BS Ext: without cyanosis, clubbing, or edema, Good distal pulses bilaterally Neuro:  Alert and Oriented x 3, Strength and sensation are intact Psych: euthymic mood, full affect  Wt Readings from Last 3 Encounters:  07/09/20 249 lb 9.6 oz (113.2 kg)  07/01/20 247 lb (112 kg)  06/14/20 271 lb 6.2 oz (123.1 kg)      Studies/Labs Reviewed:   EKG:  EKG is not ordered today.   Recent Labs: 06/08/2020: B Natriuretic Peptide 2,673.0 06/11/2020: ALT 20 06/12/2020: Magnesium 2.1 06/13/2020: BUN 119; Creatinine, Ser 7.39; Potassium 3.8; Sodium 140 06/14/2020: Hemoglobin 8.8; Platelets 240   Lipid Panel    Component Value Date/Time   CHOL 169 01/26/2019 0000   TRIG 80 06/08/2020 1621   HDL 45 01/26/2019 0000   CHOLHDL 4.5 08/23/2012 0710   VLDL 43 (H) 08/23/2012 0710   LDLCALC 99 01/26/2019 0000    Additional studies/ records that were reviewed today include:  TEE 06/12/2020 Brief TEE Note   LVEF 60-65% Moderate tricuspid regurgitation Severe mitral annular calcification with mild mitral regurgitation and mild-moderate mitral stenosis.  Bioprosthetic aortic valve moderately stenosed.  No aortic regurgitation. No LA/LAA thrombus. No evidence of endocarditis.   Tiffany C. Oval Linsey, MD, [facility] 06/12/2020 2:30  PM  2D echo 06/10/2020 IMPRESSIONS     1. Left ventricular ejection fraction, by estimation, is 60 to 65%. The  left ventricle has normal function. The left ventricle has no regional  wall motion abnormalities. There is moderate left ventricular hypertrophy.  Left ventricular diastolic  parameters are consistent with Grade II diastolic dysfunction  (  pseudonormalization).   2. Right ventricular systolic function is mildly reduced. The right  ventricular size is mildly enlarged. There is severely elevated pulmonary  artery systolic pressure. The estimated right ventricular systolic  pressure is 26.2 mmHg.   3. Left atrial size was mild to moderately dilated.   4. Right atrial size was mildly dilated.   5. The mitral valve is degenerative. Mild to moderate mitral valve  regurgitation. Mild to moderate mitral stenosis, mean gradient 11 mmHg but  MVA 2.3 cm^2 by PHT. Suspect elevated gradient related in part to high  flow. Moderate to severe mitral annular  calcification.   6. Bioprosthetic aortic valve. Mean gradient 28 mmHg, moderately  elevated. No significant regurgitation.   7. Tricuspid valve regurgitation is moderate.   8. The inferior vena cava is dilated in size with <50% respiratory  variability, suggesting right atrial pressure of 15 mmHg.   9. Neither the bioprosthetic aortic valve nor the heavily calcified  mitral valve/annulus were visualized well enough to rule out endocarditis.  Suggest TEE.   FINDINGS   Left Ventricle: Left ventricular ejection fraction, by estimation, is 60  to 65%. The left ventricle has normal function. The left ventricle has no  regional wall motion abnormalities. The left ventricular internal cavity  size was normal in size. There is   moderate left ventricular hypertrophy. Left ventricular diastolic  parameters are consistent with Grade II diastolic dysfunction  (pseudonormalization).   Right Ventricle: The right ventricular size is mildly  enlarged. No  increase in right ventricular wall thickness. Right ventricular systolic  function is mildly reduced. There is severely elevated pulmonary artery  systolic pressure. The tricuspid  regurgitant velocity is 3.47 m/s, and with an assumed right atrial  pressure of 15 mmHg, the estimated right ventricular systolic pressure is  03.5 mmHg.   Left Atrium: Left atrial size was mild to moderately dilated.   Right Atrium: Right atrial size was mildly dilated.   Pericardium: There is no evidence of pericardial effusion.   Mitral Valve: The mitral valve is degenerative in appearance. There is  mild calcification of the mitral valve leaflet(s). Moderate to severe  mitral annular calcification. Mild to moderate mitral valve regurgitation.  Mild to moderate mitral valve  stenosis. MV peak gradient, 21.3 mmHg. The mean mitral valve gradient is  11.0 mmHg.   Tricuspid Valve: The tricuspid valve is normal in structure. Tricuspid  valve regurgitation is moderate.   Aortic Valve: Bioprosthetic aortic valve. Mean gradient 28 mmHg,  moderately elevated. No significant regurgitation. The aortic valve has  been repaired/replaced. Aortic valve regurgitation is not visualized.  Aortic valve mean gradient measures 28.0 mmHg.  Aortic valve peak gradient measures 43.8 mmHg. Aortic valve area, by VTI  measures 2.02 cm. There is a 25 mm Edwards MAGNA EASE valve present in  the aortic position. Procedure Date: 07/11/2012.   Pulmonic Valve: The pulmonic valve was normal in structure. Pulmonic valve  regurgitation is trivial.   Aorta: The aortic root is normal in size and structure.   Venous: The inferior vena cava is dilated in size with less than 50%  respiratory variability, suggesting right atrial pressure of 15 mmHg.   IAS/Shunts: No atrial level shunt detected by color flow Doppler.     LEFT VENTRICLE  PLAX 2D  LVIDd:         5.00 cm  Diastology  LVIDs:         3.10 cm  LV e' medial:     5.55  cm/s  LV PW:         1.40 cm  LV E/e' medial:  36.6  LV IVS:        1.80 cm  LV e' lateral:   7.29 cm/s  LVOT diam:     2.70 cm  LV E/e' lateral: 27.8  LV SV:         134  LV SV Index:   58  LVOT Area:     5.73 cm                            3D Volume EF:                          3D EF:        63 %                          LV EDV:       322 ml                          LV ESV:       118 ml                          LV SV:        204 ml   RIGHT VENTRICLE  RV S prime:     10.00 cm/s  TAPSE (M-mode): 1.6 cm   LEFT ATRIUM             Index       RIGHT ATRIUM           Index  LA diam:        5.30 cm 2.28 cm/m  RA Area:     21.60 cm  LA Vol (A2C):   93.3 ml 40.08 ml/m RA Volume:   59.40 ml  25.52 ml/m  LA Vol (A4C):   83.6 ml 35.92 ml/m  LA Biplane Vol: 92.5 ml 39.74 ml/m   AORTIC VALVE  AV Area (Vmax):    1.87 cm  AV Area (Vmean):   1.87 cm  AV Area (VTI):     2.02 cm  AV Vmax:           331.00 cm/s  AV Vmean:          231.333 cm/s  AV VTI:            0.665 m  AV Peak Grad:      43.8 mmHg  AV Mean Grad:      28.0 mmHg  LVOT Vmax:         108.00 cm/s  LVOT Vmean:        75.400 cm/s  LVOT VTI:          0.234 m  LVOT/AV VTI ratio: 0.35    AORTA  Ao Asc diam: 3.50 cm   MITRAL VALVE                TRICUSPID VALVE  MV Area (PHT): 5.02 cm     TR Peak grad:   48.2 mmHg  MV Peak grad:  21.3 mmHg    TR Vmax:        347.00 cm/s  MV Mean grad:  11.0 mmHg  MV Vmax:       2.31 m/s  SHUNTS  MV Vmean:      149.5 cm/s   Systemic VTI:  0.23 m  MV Decel Time: 151 msec     Systemic Diam: 2.70 cm  MR Peak grad: 96.4 mmHg  MR Mean grad: 68.0 mmHg  MR Vmax:      491.00 cm/s  MR Vmean:     401.0 cm/s  MV E velocity: 203.00 cm/s  MV A velocity: 187.00 cm/s  MV E/A ratio:  1.09   Loralie Champagne MD  Electronically signed by Loralie Champagne MD  Signature Date/Time: 06/10/2020/2:33:55 PM        Final (Updated)     Risk Assessment/Calculations:         ASSESSMENT:     1. Coronary artery disease involving coronary bypass graft of native heart without angina pectoris   2. Pulmonary embolism with acute cor pulmonale, unspecified chronicity, unspecified pulmonary embolism type ([place])   3. Enterococcal bacteremia   4. Essential hypertension   5. Other hyperlipidemia   6. ESRD (end stage renal disease) ([place])      PLAN:  In order of problems listed above:  CAD status post CABG in pericardial tissue valve AVR 2013-no angina and no endocarditis on TEE. Potassium low yesterday on blood work and is being replaced.  Overall stable no changes  Bacteremia with TEE 06/12/2020 no endocarditis, LVEF 60 to 65% moderate TR, mild MR with mild to moderate MS bioprosthetic aortic valve moderately stenosed. Will follow  Pulmonary embolus on Eliquis-no bleeding problems  Hypertension BP well controlled  HLD on simvastatin  ESRD on peritoneal dialysis daily. Still urinates and takes lasix. Edema has gone down dramatically since hospitalization.  Shared Decision Making/Informed Consent        Medication Adjustments/Labs and Tests Ordered: Current medicines are reviewed at length with the patient today.  Concerns regarding medicines are outlined above.  Medication changes, Labs and Tests ordered today are listed in the Patient Instructions below. There are no Patient Instructions on file for this visit.   Signed, Ermalinda Barrios, PA-C  07/09/2020 1:29 PM    [facility] [address], [place], [place]  [address] Phone: [phone]; Fax: [phone]

## 2020-07-08 ENCOUNTER — Encounter: Payer: Self-pay | Admitting: Infectious Disease

## 2020-07-09 ENCOUNTER — Ambulatory Visit (INDEPENDENT_AMBULATORY_CARE_PROVIDER_SITE_OTHER): Payer: Managed Care, Other (non HMO) | Admitting: Physician Assistant

## 2020-07-09 ENCOUNTER — Other Ambulatory Visit: Payer: Self-pay

## 2020-07-09 ENCOUNTER — Encounter: Payer: Self-pay | Admitting: Physician Assistant

## 2020-07-09 VITALS — BP 110/58 | HR 60 | Ht 69.0 in | Wt 249.6 lb

## 2020-07-09 DIAGNOSIS — I2609 Other pulmonary embolism with acute cor pulmonale: Secondary | ICD-10-CM | POA: Diagnosis not present

## 2020-07-09 DIAGNOSIS — I25810 Atherosclerosis of coronary artery bypass graft(s) without angina pectoris: Secondary | ICD-10-CM | POA: Diagnosis not present

## 2020-07-09 DIAGNOSIS — R7881 Bacteremia: Secondary | ICD-10-CM | POA: Diagnosis not present

## 2020-07-09 DIAGNOSIS — N186 End stage renal disease: Secondary | ICD-10-CM

## 2020-07-09 DIAGNOSIS — I10 Essential (primary) hypertension: Secondary | ICD-10-CM

## 2020-07-09 DIAGNOSIS — B952 Enterococcus as the cause of diseases classified elsewhere: Secondary | ICD-10-CM

## 2020-07-09 DIAGNOSIS — E7849 Other hyperlipidemia: Secondary | ICD-10-CM

## 2020-07-09 NOTE — Patient Instructions (Signed)
Medication Instructions:  Your physician recommends that you continue on your current medications as directed. Please refer to the Current Medication list given to you today.  *If you need a refill on your cardiac medications before your next appointment, please call your pharmacy*   Lab Work: NONE   If you have labs (blood work) drawn today and your tests are completely normal, you will receive your results only by: Marland Kitchen MyChart Message (if you have MyChart) OR . A paper copy in the mail If you have any lab test that is abnormal or we need to change your treatment, we will call you to review the results.   Testing/Procedures: NONE    Follow-Up: At [facility], you and your health needs are our priority.  As part of our continuing mission to provide you with exceptional heart care, we have created designated Provider Care Teams.  These Care Teams include your primary Cardiologist (physician) and Advanced Practice Providers (APPs -  Physician Assistants and Nurse Practitioners) who all work together to provide you with the care you need, when you need it.  We recommend signing up for the patient portal called "MyChart".  Sign up information is provided on this After Visit Summary.  MyChart is used to connect with patients for Virtual Visits (Telemedicine).  Patients are able to view lab/test results, encounter notes, upcoming appointments, etc.  Non-urgent messages can be sent to your provider as well.   To learn more about what you can do with MyChart, go to [email or web address].    Your next appointment:   2-3  month(s)  The format for your next appointment:   In Person  Provider:   Rozann Lesches, MD   Other Instructions Thank you for choosing [facility]!

## 2020-07-10 ENCOUNTER — Telehealth: Payer: Self-pay

## 2020-07-10 ENCOUNTER — Ambulatory Visit (INDEPENDENT_AMBULATORY_CARE_PROVIDER_SITE_OTHER): Payer: Managed Care, Other (non HMO) | Admitting: Infectious Disease

## 2020-07-10 ENCOUNTER — Encounter: Payer: Self-pay | Admitting: Infectious Disease

## 2020-07-10 ENCOUNTER — Other Ambulatory Visit (HOSPITAL_COMMUNITY)
Admission: RE | Admit: 2020-07-10 | Discharge: 2020-07-10 | Disposition: A | Discharge status: Home / Self Care | Payer: Managed Care, Other (non HMO) | Source: Skilled Nursing Facility | Attending: Infectious Disease | Admitting: Infectious Disease

## 2020-07-10 VITALS — BP 100/62 | HR 76 | Temp 98.2°F | Wt 250.0 lb

## 2020-07-10 DIAGNOSIS — I330 Acute and subacute infective endocarditis: Secondary | ICD-10-CM

## 2020-07-10 DIAGNOSIS — R7881 Bacteremia: Secondary | ICD-10-CM

## 2020-07-10 DIAGNOSIS — I350 Nonrheumatic aortic (valve) stenosis: Secondary | ICD-10-CM | POA: Diagnosis not present

## 2020-07-10 DIAGNOSIS — D472 Monoclonal gammopathy: Secondary | ICD-10-CM | POA: Diagnosis not present

## 2020-07-10 DIAGNOSIS — E876 Hypokalemia: Secondary | ICD-10-CM | POA: Diagnosis present

## 2020-07-10 DIAGNOSIS — I76 Septic arterial embolism: Secondary | ICD-10-CM

## 2020-07-10 DIAGNOSIS — B952 Enterococcus as the cause of diseases classified elsewhere: Secondary | ICD-10-CM

## 2020-07-10 DIAGNOSIS — Z952 Presence of prosthetic heart valve: Secondary | ICD-10-CM

## 2020-07-10 HISTORY — DX: Hypokalemia: E87.6

## 2020-07-10 LAB — POTASSIUM: Potassium: 2.8 mmol/L — ABNORMAL LOW (ref 3.5–5.1)

## 2020-07-10 NOTE — Progress Notes (Signed)
Subjective:  Chief complaint follow-up for bacteremia and endocarditis  Patient ID: [name], male    DOB: 1946/05/15, 75 y.o.   MRN: [id number]  HPI  Mr. 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  While his transesophageal echocardiogram failed to show evidence of a vegetation on TEE he had had a VQ scan in the context of a positive D-dimer that suggested a possible embolism in the lungs.  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  Also continuing peritoneal dialysis and was noted on routine labs yesterday to have potassium that it dropped to 2.7 he has taken 20 mEq of potassium yesterday and also today once.  He is not appear to have any symptoms of hypokalemia.   Past Medical History:  Diagnosis Date  . Aortic stenosis   . Arthritis   . Atherosclerotic heart disease of native coronary artery without angina pectoris   . CAD (coronary artery disease)   . Chronic obstructive pulmonary disease, unspecified ([place])   . COPD (chronic obstructive pulmonary disease) ([place])   . Depression   . Essential (primary) hypertension   . Gout, unspecified   . Heart murmur   . Hemorrhoid   . Hyperlipidemia, unspecified   . Hypertension   . Hypothyroidism   . Hypothyroidism, unspecified   . Kidney stones   . Major depressive disorder, single episode, unspecified   . Morbid (severe) obesity due to excess calories ([place])   . Nonrheumatic aortic (valve) stenosis   . Orthostatic hypotension   . Syncope   . Thyroid disease     Past Surgical History:  Procedure  Laterality Date  . ANGIOPASTY    . AORTIC VALVE REPLACEMENT  07/11/2012   Procedure: AORTIC VALVE REPLACEMENT (AVR);  Surgeon: Gaye Pollack, MD;  Location: [place];  Service: Open Heart Surgery;  Laterality: N/A;  . AV FISTULA PLACEMENT Left 05/30/2020   Procedure: ARTERIOVENOUS (AV) FISTULA CREATION LEFT;  Surgeon: [name], MD;  Location: [facility];  Service: Vascular;  Laterality: Left;  . CARDIAC CATHETERIZATION    . CAROTID ENDARTERECTOMY    . CORONARY ARTERY BYPASS GRAFT  07/11/2012   Procedure: CORONARY ARTERY BYPASS GRAFTING (CABG);  Surgeon: Gaye Pollack, MD;  Location: [place];  Service: Open Heart Surgery;  Laterality: N/A;  CABG x one,  using right leg greater saphenous vein harvested endoscopically  . INTRAOPERATIVE TRANSESOPHAGEAL ECHOCARDIOGRAM  07/11/2012   Procedure: INTRAOPERATIVE TRANSESOPHAGEAL ECHOCARDIOGRAM;  Surgeon: Gaye Pollack, MD;  Location: [facility] OR;  Service: Open Heart Surgery;  Laterality: N/A;  . IR PERC TUN PERIT CATH WO PORT S&I [facility]  06/13/2020  . IR US GUIDE VASC ACCESS RIGHT  06/13/2020  . JOINT REPLACEMENT    . TEE WITHOUT CARDIOVERSION N/A 06/12/2020   Procedure: TRANSESOPHAGEAL ECHOCARDIOGRAM (TEE);  Surgeon: Skeet Latch, MD;  Location: [facility] ENDOSCOPY;  Service: Cardiovascular;  Laterality: N/A;    Family History  Problem Relation Age of Onset  . Heart disease Mother   . Heart disease Father   . Hypertension Father       Social History   Socioeconomic History  . Marital status:  Married    Spouse name: Not on file  . Number of children: 1  . Years of education: HS  . Highest education level: Not on file  Occupational History  . Occupation: Retired   Tobacco Use  . Smoking status: Former Smoker    Packs/day: 3.00    Years: 50.00    Pack years: 150.00    Types: Cigarettes    Quit date: 12/13/2010    Years since quitting: 9.5  . Smokeless tobacco: Former Systems developer  . Tobacco comment: Electronic cigarette...USES INFREQUENTLY NOW  Vaping  Use  . Vaping Use: Former  . Start date: 02/01/2013  . Quit date: 06/08/2020  Substance and Sexual Activity  . Alcohol use: Not Currently    Comment: Rare  . Drug use: No  . Sexual activity: Not Currently  Other Topics Concern  . Not on file  Social History Narrative   Drinks about 3 cups of coffee a day, drinks about 2 sundrops a day    Social Determinants of Radio broadcast assistant Strain: Not on file  Food Insecurity: Not on file  Transportation Needs: Not on file  Physical Activity: Not on file  Stress: Not on file  Social Connections: Not on file    No Known Allergies   Current Outpatient Medications:  .  albuterol (PROVENTIL HFA;VENTOLIN HFA) 108 (90 BASE) MCG/ACT inhaler, Inhale 2 puffs into the lungs every 6 (six) hours as needed for wheezing or shortness of breath. , Disp: , Rfl:  .  allopurinol (ZYLOPRIM) 300 MG tablet, Take 300 mg by mouth daily., Disp: , Rfl:  .  amLODipine (NORVASC) 5 MG tablet, Take 5 mg by mouth daily., Disp: , Rfl:  .  ampicillin IVPB, Inject 2 g into the vein every 12 (twelve) hours. 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  apixaban (ELIQUIS) 5 MG TABS tablet, Take 1 tablet (5 mg total) by mouth 2 (two) times daily., Disp: 60 tablet, Rfl: 3 .  aspirin EC 81 MG tablet, Take 1 tablet (81 mg total) by mouth daily. Swallow whole., Disp: 150 tablet, Rfl: 2 .  cefTRIAXone (ROCEPHIN) IVPB, Inject 2 g into the vein every 12 (twelve) hours. 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  Cholecalciferol (VITAMIN D3) 50 MCG (2000 UT) capsule, Take 2,000-4,000 Units by mouth See admin instructions. Take 4000 units by mouth in the morning and 2000 units in the evening, Disp: , Rfl:  .  docusate sodium (COLACE) 100 MG capsule, Take 100 mg by mouth 2 (two) times daily., Disp: , Rfl:  .  finasteride (PROSCAR) 5 MG tablet, Take 5 mg by mouth daily., Disp: , Rfl:  .  fluticasone (FLONASE) 50 MCG/ACT nasal spray, Place 1 spray into both nostrils daily., Disp: ,  Rfl:  .  furosemide (LASIX) 40 MG tablet, Take 40 mg by mouth 2 (two) times daily., Disp: , Rfl:  .  ipratropium-albuterol (DUONEB) 0.5-2.5 (3) MG/3ML SOLN, SMARTSIG:3 Milliliter(s) Via Nebulizer Every 6 Hours, Disp: , Rfl:  .  L-Lysine 500 MG TABS, Take 500 mg by mouth daily. , Disp: , Rfl:  .  levothyroxine (SYNTHROID) 175 MCG tablet, Take 175 mcg by mouth daily before breakfast. , Disp: , Rfl:  .  metoprolol tartrate (LOPRESSOR) 25 MG tablet, Take 25 mg by mouth 2 (two) times daily., Disp: , Rfl:  .  Multiple Minerals-Vitamins (CALCIUM-MAGNESIUM-ZINC-D3) TABS, Take 1 tablet by mouth daily., Disp: , Rfl:  .  Multiple Vitamin (MULTIVITAMIN) tablet, Take 1 tablet by mouth daily., Disp: , Rfl:  .  Omega-3 Fatty Acids (FISH OIL PO), Take 2 capsules by mouth daily. , Disp: , Rfl:  .  pantoprazole (PROTONIX) 40 MG tablet, Take 40 mg by mouth daily., Disp: , Rfl:  .  polyethylene glycol (MIRALAX / GLYCOLAX) 17 g packet, Take 17 g by mouth daily., Disp: 14 each, Rfl: 0 .  potassium chloride SA (KLOR-CON) 20 MEQ tablet, Take 20 mEq by mouth 2 (two) times daily., Disp: , Rfl:  .  sertraline (ZOLOFT) 100 MG tablet, Take 100 mg by  mouth daily., Disp: , Rfl:  .  simvastatin (ZOCOR) 40 MG tablet, Take 40 mg by mouth every evening., Disp: , Rfl:  .  sodium bicarbonate 650 MG tablet, Take 2 tablets (1,300 mg total) by mouth 3 (three) times daily with meals., Disp: 90 tablet, Rfl: 2 .  Travoprost, BAK Free, (TRAVATAN) 0.004 % SOLN ophthalmic solution, Place 1 drop into both eyes at bedtime., Disp: , Rfl:  .  apixaban (ELIQUIS) 5 MG TABS tablet, Take 2 tablets (10 mg total) by mouth 2 (two) times daily for 5 days., Disp: 20 tablet, Rfl: 0 .  calcitRIOL (ROCALTROL) 0.25 MCG capsule, Take 0.25 mcg by mouth every other day.  (Patient not taking: Reported on 07/09/2020), Disp: , Rfl:    Review of Systems  Constitutional: Negative for activity change, appetite change, chills, diaphoresis, fatigue, fever and unexpected weight change.  HENT: Negative for congestion, rhinorrhea, sinus pressure, sneezing, sore throat and trouble swallowing.   Eyes: Negative for photophobia and visual disturbance.  Respiratory: Negative for cough, chest tightness, shortness of breath, wheezing and stridor.   Cardiovascular: Negative for chest pain, palpitations and leg swelling.  Gastrointestinal: Negative for abdominal distention, abdominal pain, anal bleeding, blood in stool, constipation, diarrhea, nausea and vomiting.  Genitourinary: Negative for difficulty urinating, dysuria, flank pain and hematuria.  Musculoskeletal: Negative for arthralgias, back pain, gait problem, joint swelling and myalgias.  Skin: Negative for color change, pallor, rash and wound.  Neurological: Negative for dizziness, tremors, weakness and light-headedness.  Hematological: Negative for adenopathy. Does not bruise/bleed easily.  Psychiatric/Behavioral: Negative for agitation, behavioral problems, confusion, decreased concentration, dysphoric mood and sleep disturbance.       Objective:   Physical Exam Constitutional:      Appearance: He is well-developed and  well-nourished.  HENT:     Head: Normocephalic and atraumatic.  Eyes:     Extraocular Movements: EOM normal.     Conjunctiva/sclera: Conjunctivae normal.  Cardiovascular:     Rate and Rhythm: Normal rate and regular rhythm.     Heart sounds: No murmur heard. No friction rub. No gallop.   Pulmonary:     Effort: Pulmonary effort is normal. No respiratory distress.  Breath sounds: Normal breath sounds. No stridor. No wheezing or rhonchi.  Abdominal:     General: There is no distension.     Palpations: Abdomen is soft.  Musculoskeletal:        General: No tenderness or edema. Normal range of motion.     Cervical back: Normal range of motion and neck supple.  Skin:    General: Skin is warm and dry.     Coloration: Skin is not pale.     Findings: No erythema or rash.  Neurological:     Mental Status: He is alert and oriented to person, place, and time.  Psychiatric:        Mood and Affect: Mood normal.        Behavior: Behavior normal.        Thought Content: Thought content normal.        Judgment: Judgment normal.     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

## 2020-07-10 NOTE — Telephone Encounter (Signed)
Per MD called advance with STAT orders to have potassium lab drawn. Spoke with Melissa who will notify nursing, Aundria Rud, [place]

## 2020-07-11 ENCOUNTER — Telehealth: Payer: Self-pay

## 2020-07-11 NOTE — Telephone Encounter (Signed)
Per MD called patient regarding low potassium level. Potassium was 2.8 on 1/13 and 2.7 last time it was checked. MD would like for patient to follow up with Nephrology. Spoke with patient's spouse who states pt was started on potassium two days ago. Will speak with Dr. Acquanetta Sit nurse today for next steps.  [place]

## 2020-07-14 ENCOUNTER — Inpatient Hospital Stay (HOSPITAL_COMMUNITY)
Admission: EM | Admit: 2020-07-14 | Discharge: 2020-07-18 | DRG: 377 | Disposition: A | Discharge status: Home Health | Payer: Managed Care, Other (non HMO) | Attending: Internal Medicine | Admitting: Internal Medicine

## 2020-07-14 ENCOUNTER — Encounter (HOSPITAL_COMMUNITY): Payer: Self-pay | Admitting: Emergency Medicine

## 2020-07-14 ENCOUNTER — Other Ambulatory Visit: Payer: Self-pay

## 2020-07-14 DIAGNOSIS — D539 Nutritional anemia, unspecified: Secondary | ICD-10-CM | POA: Diagnosis present

## 2020-07-14 DIAGNOSIS — Y831 Surgical operation with implant of artificial internal device as the cause of abnormal reaction of the patient, or of later complication, without mention of misadventure at the time of the procedure: Secondary | ICD-10-CM | POA: Diagnosis present

## 2020-07-14 DIAGNOSIS — T826XXA Infection and inflammatory reaction due to cardiac valve prosthesis, initial encounter: Secondary | ICD-10-CM | POA: Diagnosis present

## 2020-07-14 DIAGNOSIS — F419 Anxiety disorder, unspecified: Secondary | ICD-10-CM | POA: Diagnosis present

## 2020-07-14 DIAGNOSIS — J449 Chronic obstructive pulmonary disease, unspecified: Secondary | ICD-10-CM | POA: Diagnosis present

## 2020-07-14 DIAGNOSIS — K921 Melena: Secondary | ICD-10-CM | POA: Diagnosis present

## 2020-07-14 DIAGNOSIS — M109 Gout, unspecified: Secondary | ICD-10-CM | POA: Diagnosis present

## 2020-07-14 DIAGNOSIS — Z992 Dependence on renal dialysis: Secondary | ICD-10-CM

## 2020-07-14 DIAGNOSIS — N186 End stage renal disease: Secondary | ICD-10-CM | POA: Diagnosis present

## 2020-07-14 DIAGNOSIS — Z952 Presence of prosthetic heart valve: Secondary | ICD-10-CM

## 2020-07-14 DIAGNOSIS — Z7901 Long term (current) use of anticoagulants: Secondary | ICD-10-CM

## 2020-07-14 DIAGNOSIS — I82462 Acute embolism and thrombosis of left calf muscular vein: Secondary | ICD-10-CM | POA: Diagnosis present

## 2020-07-14 DIAGNOSIS — Z951 Presence of aortocoronary bypass graft: Secondary | ICD-10-CM

## 2020-07-14 DIAGNOSIS — D631 Anemia in chronic kidney disease: Secondary | ICD-10-CM | POA: Diagnosis present

## 2020-07-14 DIAGNOSIS — R0602 Shortness of breath: Secondary | ICD-10-CM

## 2020-07-14 DIAGNOSIS — D689 Coagulation defect, unspecified: Secondary | ICD-10-CM | POA: Diagnosis not present

## 2020-07-14 DIAGNOSIS — Z7982 Long term (current) use of aspirin: Secondary | ICD-10-CM

## 2020-07-14 DIAGNOSIS — I82412 Acute embolism and thrombosis of left femoral vein: Secondary | ICD-10-CM | POA: Diagnosis present

## 2020-07-14 DIAGNOSIS — E039 Hypothyroidism, unspecified: Secondary | ICD-10-CM | POA: Diagnosis present

## 2020-07-14 DIAGNOSIS — F32A Depression, unspecified: Secondary | ICD-10-CM | POA: Diagnosis present

## 2020-07-14 DIAGNOSIS — J9611 Chronic respiratory failure with hypoxia: Secondary | ICD-10-CM | POA: Diagnosis present

## 2020-07-14 DIAGNOSIS — D649 Anemia, unspecified: Secondary | ICD-10-CM | POA: Diagnosis not present

## 2020-07-14 DIAGNOSIS — K922 Gastrointestinal hemorrhage, unspecified: Secondary | ICD-10-CM

## 2020-07-14 DIAGNOSIS — Z79899 Other long term (current) drug therapy: Secondary | ICD-10-CM

## 2020-07-14 DIAGNOSIS — I5032 Chronic diastolic (congestive) heart failure: Secondary | ICD-10-CM | POA: Diagnosis present

## 2020-07-14 DIAGNOSIS — Z20822 Contact with and (suspected) exposure to covid-19: Secondary | ICD-10-CM | POA: Diagnosis present

## 2020-07-14 DIAGNOSIS — I2699 Other pulmonary embolism without acute cor pulmonale: Secondary | ICD-10-CM | POA: Diagnosis present

## 2020-07-14 DIAGNOSIS — R7881 Bacteremia: Secondary | ICD-10-CM | POA: Diagnosis present

## 2020-07-14 DIAGNOSIS — I2510 Atherosclerotic heart disease of native coronary artery without angina pectoris: Secondary | ICD-10-CM | POA: Diagnosis present

## 2020-07-14 DIAGNOSIS — D62 Acute posthemorrhagic anemia: Secondary | ICD-10-CM | POA: Diagnosis present

## 2020-07-14 DIAGNOSIS — K5521 Angiodysplasia of colon with hemorrhage: Secondary | ICD-10-CM | POA: Diagnosis present

## 2020-07-14 DIAGNOSIS — K635 Polyp of colon: Secondary | ICD-10-CM | POA: Diagnosis present

## 2020-07-14 DIAGNOSIS — E785 Hyperlipidemia, unspecified: Secondary | ICD-10-CM | POA: Diagnosis present

## 2020-07-14 DIAGNOSIS — K648 Other hemorrhoids: Secondary | ICD-10-CM | POA: Diagnosis present

## 2020-07-14 DIAGNOSIS — D472 Monoclonal gammopathy: Secondary | ICD-10-CM | POA: Diagnosis present

## 2020-07-14 DIAGNOSIS — Z87442 Personal history of urinary calculi: Secondary | ICD-10-CM

## 2020-07-14 DIAGNOSIS — Z8249 Family history of ischemic heart disease and other diseases of the circulatory system: Secondary | ICD-10-CM

## 2020-07-14 DIAGNOSIS — I132 Hypertensive heart and chronic kidney disease with heart failure and with stage 5 chronic kidney disease, or end stage renal disease: Secondary | ICD-10-CM | POA: Diagnosis present

## 2020-07-14 DIAGNOSIS — Z6837 Body mass index (BMI) 37.0-37.9, adult: Secondary | ICD-10-CM

## 2020-07-14 DIAGNOSIS — E8889 Other specified metabolic disorders: Secondary | ICD-10-CM | POA: Diagnosis present

## 2020-07-14 DIAGNOSIS — E876 Hypokalemia: Secondary | ICD-10-CM | POA: Diagnosis not present

## 2020-07-14 DIAGNOSIS — I330 Acute and subacute infective endocarditis: Secondary | ICD-10-CM | POA: Diagnosis present

## 2020-07-14 DIAGNOSIS — I350 Nonrheumatic aortic (valve) stenosis: Secondary | ICD-10-CM | POA: Diagnosis present

## 2020-07-14 DIAGNOSIS — Z87891 Personal history of nicotine dependence: Secondary | ICD-10-CM

## 2020-07-14 DIAGNOSIS — I82432 Acute embolism and thrombosis of left popliteal vein: Secondary | ICD-10-CM | POA: Diagnosis present

## 2020-07-14 DIAGNOSIS — Z9981 Dependence on supplemental oxygen: Secondary | ICD-10-CM

## 2020-07-14 DIAGNOSIS — N400 Enlarged prostate without lower urinary tract symptoms: Secondary | ICD-10-CM | POA: Diagnosis present

## 2020-07-14 DIAGNOSIS — Z79890 Hormone replacement therapy (postmenopausal): Secondary | ICD-10-CM

## 2020-07-14 DIAGNOSIS — B952 Enterococcus as the cause of diseases classified elsewhere: Secondary | ICD-10-CM | POA: Diagnosis present

## 2020-07-14 DIAGNOSIS — R609 Edema, unspecified: Secondary | ICD-10-CM | POA: Diagnosis not present

## 2020-07-14 DIAGNOSIS — I38 Endocarditis, valve unspecified: Secondary | ICD-10-CM

## 2020-07-14 HISTORY — DX: Other specified postprocedural states: Z98.890

## 2020-07-14 LAB — CBC
HCT: 24.3 % — ABNORMAL LOW (ref 39.0–52.0)
Hemoglobin: 7.1 g/dL — ABNORMAL LOW (ref 13.0–17.0)
MCH: 30.1 pg (ref 26.0–34.0)
MCHC: 29.2 g/dL — ABNORMAL LOW (ref 30.0–36.0)
MCV: 103 fL — ABNORMAL HIGH (ref 80.0–100.0)
Platelets: 224 10*3/uL (ref 150–400)
RBC: 2.36 MIL/uL — ABNORMAL LOW (ref 4.22–5.81)
RDW: 17.7 % — ABNORMAL HIGH (ref 11.5–15.5)
WBC: 8.8 10*3/uL (ref 4.0–10.5)
nRBC: 0.3 % — ABNORMAL HIGH (ref 0.0–0.2)

## 2020-07-14 LAB — COMPREHENSIVE METABOLIC PANEL
ALT: 15 U/L (ref 0–44)
AST: 26 U/L (ref 15–41)
Albumin: 2.1 g/dL — ABNORMAL LOW (ref 3.5–5.0)
Alkaline Phosphatase: 66 U/L (ref 38–126)
Anion gap: 12 (ref 5–15)
BUN: 43 mg/dL — ABNORMAL HIGH (ref 8–23)
CO2: 31 mmol/L (ref 22–32)
Calcium: 8.9 mg/dL (ref 8.9–10.3)
Chloride: 98 mmol/L (ref 98–111)
Creatinine, Ser: 5.59 mg/dL — ABNORMAL HIGH (ref 0.61–1.24)
GFR, Estimated: 10 mL/min — ABNORMAL LOW (ref >60)
Glucose, Bld: 97 mg/dL (ref 70–99)
Potassium: 3.6 mmol/L (ref 3.5–5.1)
Sodium: 141 mmol/L (ref 135–145)
Total Bilirubin: 0.4 mg/dL (ref 0.3–1.2)
Total Protein: 5.8 g/dL — ABNORMAL LOW (ref 6.5–8.1)

## 2020-07-14 LAB — PROTIME-INR
INR: 1.5 — ABNORMAL HIGH (ref 0.8–1.2)
Prothrombin Time: 17.9 seconds — ABNORMAL HIGH (ref 11.4–15.2)

## 2020-07-14 LAB — SARS CORONAVIRUS 2 (TAT 6-24 HRS): SARS Coronavirus 2: NEGATIVE

## 2020-07-14 LAB — D-DIMER, QUANTITATIVE: D-Dimer, Quant: 1.42 ug/mL-FEU — ABNORMAL HIGH (ref 0.00–0.50)

## 2020-07-14 LAB — POC OCCULT BLOOD, ED: Fecal Occult Bld: POSITIVE — AB

## 2020-07-14 LAB — LIPASE, BLOOD: Lipase: 42 U/L (ref 11–51)

## 2020-07-14 MED ORDER — ACETAMINOPHEN 325 MG PO TABS
650.0000 mg | ORAL_TABLET | Freq: Four times a day (QID) | ORAL | Status: DC | PRN
  Administered 2020-07-17: 650 mg via ORAL
  Filled 2020-07-14: qty 2

## 2020-07-14 MED ORDER — LEVOTHYROXINE SODIUM 75 MCG PO TABS
175.0000 ug | ORAL_TABLET | Freq: Every day | ORAL | Status: DC
  Administered 2020-07-17 – 2020-07-18 (×2): 175 ug via ORAL
  Filled 2020-07-14 (×2): qty 1

## 2020-07-14 MED ORDER — VITAMIN D 25 MCG (1000 UNIT) PO TABS: 2000.0000 [IU] | ORAL_TABLET | ORAL | Status: DC

## 2020-07-14 MED ORDER — ALBUTEROL SULFATE HFA 108 (90 BASE) MCG/ACT IN AERS
2.0000 | INHALATION_SPRAY | Freq: Four times a day (QID) | RESPIRATORY_TRACT | Status: DC | PRN
  Filled 2020-07-14: qty 6.7

## 2020-07-14 MED ORDER — ADULT MULTIVITAMIN W/MINERALS CH
1.0000 | ORAL_TABLET | Freq: Every day | ORAL | Status: DC
  Administered 2020-07-16 – 2020-07-18 (×3): 1 via ORAL
  Filled 2020-07-14 (×3): qty 1

## 2020-07-14 MED ORDER — ONDANSETRON HCL 4 MG PO TABS: 4.0000 mg | ORAL_TABLET | Freq: Four times a day (QID) | ORAL | Status: DC | PRN

## 2020-07-14 MED ORDER — FLUTICASONE PROPIONATE 50 MCG/ACT NA SUSP
1.0000 | Freq: Every day | NASAL | Status: DC
  Administered 2020-07-16 – 2020-07-18 (×3): 1 via NASAL
  Filled 2020-07-14: qty 16

## 2020-07-14 MED ORDER — FINASTERIDE 5 MG PO TABS
5.0000 mg | ORAL_TABLET | Freq: Every day | ORAL | Status: DC
  Administered 2020-07-16 – 2020-07-18 (×3): 5 mg via ORAL
  Filled 2020-07-14 (×4): qty 1

## 2020-07-14 MED ORDER — PANTOPRAZOLE SODIUM 40 MG PO TBEC: 40.0000 mg | DELAYED_RELEASE_TABLET | Freq: Every day | ORAL | Status: DC

## 2020-07-14 MED ORDER — ALLOPURINOL 300 MG PO TABS
300.0000 mg | ORAL_TABLET | Freq: Every day | ORAL | Status: DC
  Filled 2020-07-14: qty 1

## 2020-07-14 MED ORDER — SIMVASTATIN 20 MG PO TABS
40.0000 mg | ORAL_TABLET | Freq: Every evening | ORAL | Status: DC
  Administered 2020-07-16 – 2020-07-17 (×2): 40 mg via ORAL
  Filled 2020-07-14 (×2): qty 2

## 2020-07-14 MED ORDER — SODIUM CHLORIDE 0.9 % IV SOLN
2.0000 g | Freq: Two times a day (BID) | INTRAVENOUS | Status: DC
  Administered 2020-07-14 – 2020-07-18 (×7): 2 g via INTRAVENOUS
  Filled 2020-07-14: qty 2
  Filled 2020-07-14 (×3): qty 2000
  Filled 2020-07-14: qty 2
  Filled 2020-07-14 (×3): qty 2000
  Filled 2020-07-14: qty 2
  Filled 2020-07-14: qty 2000

## 2020-07-14 MED ORDER — SODIUM CHLORIDE 0.9 % IV SOLN
2.0000 g | Freq: Two times a day (BID) | INTRAVENOUS | Status: DC
  Administered 2020-07-14 – 2020-07-18 (×8): 2 g via INTRAVENOUS
  Filled 2020-07-14: qty 2
  Filled 2020-07-14 (×6): qty 20
  Filled 2020-07-14 (×2): qty 2
  Filled 2020-07-14: qty 20

## 2020-07-14 MED ORDER — CALCIUM-MAGNESIUM-ZINC-D3 PO TABS: 1.0000 | ORAL_TABLET | Freq: Every day | ORAL | Status: DC

## 2020-07-14 MED ORDER — L-LYSINE 500 MG PO TABS: 500.0000 mg | ORAL_TABLET | Freq: Every day | ORAL | Status: DC

## 2020-07-14 MED ORDER — SODIUM BICARBONATE 650 MG PO TABS
1300.0000 mg | ORAL_TABLET | Freq: Three times a day (TID) | ORAL | Status: DC
  Administered 2020-07-15 – 2020-07-18 (×7): 1300 mg via ORAL
  Filled 2020-07-14 (×9): qty 2

## 2020-07-14 MED ORDER — SODIUM CHLORIDE 0.9 % IV SOLN
80.0000 mg | Freq: Once | INTRAVENOUS | Status: AC
  Administered 2020-07-14: 80 mg via INTRAVENOUS
  Filled 2020-07-14: qty 80

## 2020-07-14 MED ORDER — AMPICILLIN IV (FOR PTA / DISCHARGE USE ONLY): 2.0000 g | Freq: Two times a day (BID) | INTRAVENOUS | Status: DC

## 2020-07-14 MED ORDER — POTASSIUM CHLORIDE CRYS ER 20 MEQ PO TBCR
20.0000 meq | EXTENDED_RELEASE_TABLET | Freq: Two times a day (BID) | ORAL | Status: DC
  Administered 2020-07-14: 20 meq via ORAL
  Filled 2020-07-14: qty 1

## 2020-07-14 MED ORDER — SODIUM CHLORIDE 0.9 % IV SOLN
8.0000 mg/h | INTRAVENOUS | Status: DC
  Administered 2020-07-14 – 2020-07-16 (×3): 8 mg/h via INTRAVENOUS
  Filled 2020-07-14 (×7): qty 80

## 2020-07-14 MED ORDER — POLYETHYLENE GLYCOL 3350 17 G PO PACK: 17.0000 g | PACK | Freq: Every day | ORAL | Status: DC | PRN

## 2020-07-14 MED ORDER — ACETAMINOPHEN 650 MG RE SUPP: 650.0000 mg | Freq: Four times a day (QID) | RECTAL | Status: DC | PRN

## 2020-07-14 MED ORDER — DOCUSATE SODIUM 100 MG PO CAPS
100.0000 mg | ORAL_CAPSULE | Freq: Every day | ORAL | Status: DC
  Administered 2020-07-17 – 2020-07-18 (×2): 100 mg via ORAL
  Filled 2020-07-14 (×3): qty 1

## 2020-07-14 MED ORDER — SERTRALINE HCL 100 MG PO TABS
100.0000 mg | ORAL_TABLET | Freq: Every day | ORAL | Status: DC
Start: 2020-07-15 — End: 2020-07-18
  Administered 2020-07-16 – 2020-07-18 (×3): 100 mg via ORAL
  Filled 2020-07-14 (×4): qty 1

## 2020-07-14 MED ORDER — LATANOPROST 0.005 % OP SOLN
1.0000 [drp] | Freq: Every day | OPHTHALMIC | Status: DC
  Administered 2020-07-15 – 2020-07-17 (×3): 1 [drp] via OPHTHALMIC
  Filled 2020-07-14: qty 2.5

## 2020-07-14 MED ORDER — ONDANSETRON HCL 4 MG/2ML IJ SOLN: 4.0000 mg | Freq: Four times a day (QID) | INTRAMUSCULAR | Status: DC | PRN

## 2020-07-14 MED ORDER — IPRATROPIUM-ALBUTEROL 0.5-2.5 (3) MG/3ML IN SOLN
3.0000 mL | Freq: Four times a day (QID) | RESPIRATORY_TRACT | Status: DC
  Administered 2020-07-15: 3 mL via RESPIRATORY_TRACT
  Filled 2020-07-14 (×2): qty 3

## 2020-07-14 MED ORDER — CEFTRIAXONE IV (FOR PTA / DISCHARGE USE ONLY): 2.0000 g | Freq: Two times a day (BID) | INTRAVENOUS | Status: DC

## 2020-07-14 NOTE — ED Provider Notes (Addendum)
[place] EMERGENCY DEPARTMENT Provider Note   CSN: [id number] Arrival date & time: 07/14/20  1109     History Chief Complaint  Patient presents with  . GI Bleeding    [name] is a 75 y.o. male.  HPI Patient is on Eliquis and aspirin.  He has been on these since his last hospitalization.  He had suspected pulmonary embolus in the setting of sepsis and ESRD on peritoneal dialysis.  Patient reports he has been feeling better since he left the hospital.  He has had some decrease in shortness of breath.  However, 3 days ago he had 2-3 episodes of black looking bowel movement.  The next day, symptoms seem resolved.  This morning he again had a pretty large black soft stool.  He reports that now he has started to feel lightheaded with standing.  He has not had a syncopal episode.  He wears 2 L of oxygen.  He describes his shortness of breath as being better than it was when he left the hospital however his wife reports that with minimal exertion even on his oxygen his saturations are dropping down into the 80s.  Patient reports his swelling in his legs is less than previously.  He denies abdominal pain.  He reports he has just a vague sensation of fullness in the lower abdomen reports he has been doing his 5 hours of peritoneal dialysis per night as planned.  He has not developed any interim fevers, general body aches, vomiting.    Past Medical History:  Diagnosis Date  . Aortic stenosis   . Arthritis   . Atherosclerotic heart disease of native coronary artery without angina pectoris   . CAD (coronary artery disease)   . Chronic obstructive pulmonary disease, unspecified ([place])   . COPD (chronic obstructive pulmonary disease) ([place])   . Depression   . Essential (primary) hypertension   . Gout, unspecified   . Heart murmur   . Hemorrhoid   . History of peritoneal dialysis   . Hyperlipidemia, unspecified   . Hypertension   . Hypokalemia 07/10/2020  . Hypothyroidism    . Hypothyroidism, unspecified   . Kidney stones   . Major depressive disorder, single episode, unspecified   . Morbid (severe) obesity due to excess calories ([place])   . Nonrheumatic aortic (valve) stenosis   . Orthostatic hypotension   . Syncope   . Thyroid disease     Patient Active Problem List   Diagnosis Date Noted  . Hypokalemia 07/10/2020  . Endocarditis   . Elevated d-dimer   . Acute on chronic congestive heart failure ([place])   . Demand ischemia ([place])   . Severe sepsis without septic shock (CODE) ([place])   . Enterococcal bacteremia   . S/P AVR (aortic valve replacement)   . Fever 06/08/2020  . 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 Essential (primary) hypertension   . Chronic obstructive pulmonary disease, unspecified ([place])   . Hyperlipidemia, unspecified   . Hypothyroidism, unspecified   . Atherosclerotic heart disease of native coronary artery without angina pectoris   . Gout, unspecified   . Major depressive disorder, single episode, unspecified   . Morbid (severe) obesity due to excess calories ([place])   . Nonrheumatic aortic (valve) stenosis   . Orthostatic hypotension   . Hypertension   . Thyroid disease   . Arthritis   . COPD (chronic obstructive pulmonary disease) ([place])   . Heart murmur   . Aortic stenosis   .  CAD (coronary artery disease)   . Syncope   . Depression   . Kidney stones     Past Surgical History:  Procedure Laterality Date  . ANGIOPASTY    . AORTIC VALVE REPLACEMENT  07/11/2012   Procedure: AORTIC VALVE REPLACEMENT (AVR);  Surgeon: Gaye Pollack, MD;  Location: [facility];  Service: Open Heart Surgery;  Laterality: N/A;  . AV FISTULA PLACEMENT Left 05/30/2020   Procedure: ARTERIOVENOUS (AV) FISTULA CREATION LEFT;  Surgeon: [name], MD;  Location: [facility];  Service: Vascular;  Laterality: Left;  . CARDIAC CATHETERIZATION    . CAROTID ENDARTERECTOMY    . CORONARY ARTERY BYPASS GRAFT  07/11/2012   Procedure:  CORONARY ARTERY BYPASS GRAFTING (CABG);  Surgeon: Gaye Pollack, MD;  Location: [facility];  Service: Open Heart Surgery;  Laterality: N/A;  CABG x one,  using right leg greater saphenous vein harvested endoscopically  . INTRAOPERATIVE TRANSESOPHAGEAL ECHOCARDIOGRAM  07/11/2012   Procedure: INTRAOPERATIVE TRANSESOPHAGEAL ECHOCARDIOGRAM;  Surgeon: Gaye Pollack, MD;  Location: [facility] OR;  Service: Open Heart Surgery;  Laterality: N/A;  . IR PERC TUN PERIT CATH WO PORT S&I [facility]  06/13/2020  . IR US GUIDE VASC ACCESS RIGHT  06/13/2020  . JOINT REPLACEMENT    . TEE WITHOUT CARDIOVERSION N/A 06/12/2020   Procedure: TRANSESOPHAGEAL ECHOCARDIOGRAM (TEE);  Surgeon: Skeet Latch, MD;  Location: [facility] ENDOSCOPY;  Service: Cardiovascular;  Laterality: N/A;       Family History  Problem Relation Age of Onset  . Heart disease Mother   . Heart disease Father   . Hypertension Father     Social History   Tobacco Use  . Smoking status: Former Smoker    Packs/day: 3.00    Years: 50.00    Pack years: 150.00    Types: Cigarettes    Quit date: 12/13/2010    Years since quitting: 9.5  . Smokeless tobacco: Former Systems developer  . Tobacco comment: Electronic cigarette...USES INFREQUENTLY NOW  Vaping Use  . Vaping Use: Former  . Start date: 02/01/2013  . Quit date: 06/08/2020  Substance Use Topics  . Alcohol use: Not Currently    Comment: Rare  . Drug use: No    Home Medications Prior to Admission medications   Medication Sig Start Date End Date Taking? Authorizing Provider  albuterol (PROVENTIL HFA;VENTOLIN HFA) 108 (90 BASE) MCG/ACT inhaler Inhale 2 puffs into the lungs every 6 (six) hours as needed for wheezing or shortness of breath.     [provider]  allopurinol (ZYLOPRIM) 300 MG tablet Take 300 mg by mouth daily.    [provider]  amLODipine (NORVASC) 5 MG tablet Take 5 mg by mouth daily.    [provider]  ampicillin IVPB Inject 2 g into the vein every 12 (twelve) hours.  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 06/14/20 07/23/20  Regalado, Jerald Kief A, MD  apixaban (ELIQUIS) 5 MG TABS tablet Take 2 tablets (10 mg total) by mouth 2 (two) times daily for 5 days. 06/14/20 06/19/20  Regalado, Belkys A, MD  apixaban (ELIQUIS) 5 MG TABS tablet Take 1 tablet (5 mg total) by mouth 2 (two) times  daily. 06/18/20   Regalado, Jerald Kief A, MD  aspirin EC 81 MG tablet Take 1 tablet (81 mg total) by mouth daily. Swallow whole. 06/14/20 06/14/21  Regalado, Belkys A, MD  calcitRIOL (ROCALTROL) 0.25 MCG capsule Take 0.25 mcg by mouth every other day.  Patient not taking: Reported on 07/09/2020    [provider]  cefTRIAXone (ROCEPHIN) IVPB Inject 2 g into the vein every 12 (twelve) hours. 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 06/14/20 07/23/20  Regalado, Jerald Kief A, MD  Cholecalciferol (VITAMIN D3) 50 MCG (2000 UT) capsule Take 2,000-4,000 Units by mouth See admin instructions. Take 4000 units by mouth in the morning and 2000 units in the evening    [provider]  docusate sodium (COLACE) 100 MG capsule Take 100 mg by mouth 2 (two) times daily.    [provider]  finasteride (PROSCAR) 5 MG tablet Take 5 mg by mouth daily.     [provider]  fluticasone (FLONASE) 50 MCG/ACT nasal spray Place 1 spray into both nostrils daily.    [provider]  furosemide (LASIX) 40 MG tablet Take 40 mg by mouth 2 (two) times daily.    [provider]  ipratropium-albuterol (DUONEB) 0.5-2.5 (3) MG/3ML SOLN SMARTSIG:3 Milliliter(s) Via Nebulizer Every 6 Hours 06/19/20   [provider]  L-Lysine 500 MG TABS Take 500 mg by mouth daily.     [provider]  levothyroxine (SYNTHROID) 175 MCG tablet Take 175 mcg by mouth daily before breakfast.  05/12/12   [provider]  metoprolol tartrate (LOPRESSOR) 25 MG tablet Take 25 mg by mouth 2 (two) times daily.    [provider]  Multiple Minerals-Vitamins (CALCIUM-MAGNESIUM-ZINC-D3) TABS Take 1 tablet by mouth daily.    [provider]  Multiple Vitamin (MULTIVITAMIN) tablet Take 1 tablet by mouth daily.    [provider]  Omega-3 Fatty Acids (FISH OIL PO) Take 2 capsules by mouth daily.     [provider]  pantoprazole (PROTONIX) 40 MG tablet Take 40 mg by mouth daily.    [provider]  polyethylene glycol (MIRALAX / GLYCOLAX) 17 g packet Take 17 g by mouth daily. 06/14/20   Regalado, Belkys A, MD  potassium chloride SA (KLOR-CON) 20 MEQ tablet Take 20 mEq by mouth 2 (two) times daily.    [provider]  sertraline (ZOLOFT) 100 MG tablet Take 100 mg by mouth daily.    [provider]  simvastatin (ZOCOR) 40 MG tablet Take 40 mg by mouth every evening.    [provider]  sodium bicarbonate 650 MG tablet Take 2 tablets (1,300 mg total) by mouth 3 (three) times daily with meals. 06/14/20   Regalado, Belkys A, MD  Travoprost, BAK Free, (TRAVATAN) 0.004 % SOLN ophthalmic solution Place 1 drop into both eyes at bedtime.    [provider]    Allergies    Patient has no known allergies.  Review of Systems   Review of Systems 10 Systems reviewed and  negative except as per HPI Physical Exam Updated Vital Signs BP 116/65   Pulse 74   Temp 97.9 F (36.6 C) (Oral)  Resp 20   SpO2 97%   Physical Exam Constitutional:      Comments: Nontoxic.  Mental status clear.  No respiratory distress at rest  HENT:     Head: Normocephalic and atraumatic.     Mouth/Throat:     Pharynx: Oropharynx is clear.  Eyes:     Extraocular Movements: Extraocular movements intact.  Cardiovascular:     Rate and Rhythm: Normal rate and regular rhythm.  Pulmonary:     Effort: Pulmonary effort is normal.     Breath sounds: Normal breath sounds.     Comments: PICC line right anterior chest.  Clean and dry site Abdominal:     General: There is no distension.     Palpations: Abdomen is soft.     Tenderness: There is no abdominal tenderness. There is no guarding.     Comments: PD catheter site clean and dry  Genitourinary:    Comments: Melanotic stool Musculoskeletal:     Comments: 2+ pitting edema bilateral lower extremities  Neurological:     General: No focal deficit present.     Mental Status: He is oriented to person, place, and time.     Coordination: Coordination normal.  Psychiatric:        Mood and Affect: Mood normal.     ED Results / Procedures / Treatments   Labs (all labs ordered are listed, but only abnormal results are displayed) Labs Reviewed  COMPREHENSIVE METABOLIC PANEL - Abnormal; Notable for the following components:      Result Value   BUN 43 (*)    Creatinine, Ser 5.59 (*)    Total Protein 5.8 (*)    Albumin 2.1 (*)    GFR, Estimated 10 (*)    All other components within normal limits  CBC - Abnormal; Notable for the following components:   RBC 2.36 (*)    Hemoglobin 7.1 (*)    HCT 24.3 (*)    MCV 103.0 (*)    MCHC 29.2 (*)    RDW 17.7 (*)    nRBC 0.3 (*)    All other components within normal limits  SARS CORONAVIRUS 2 (TAT 6-24 HRS)  PROTIME-INR  LIPASE, BLOOD  POC OCCULT BLOOD, ED  TYPE AND SCREEN     EKG None  Radiology No results found.  Procedures Procedures (including critical care time) CRITICAL CARE Performed by: Charlesetta Shanks   Total critical care time: 30 minutes  Critical care time was exclusive of separately billable procedures and treating other patients.  Critical care was necessary to treat or prevent imminent or life-threatening deterioration.  Critical care was time spent personally by me on the following activities: development of treatment plan with patient and/or surrogate as well as nursing, discussions with consultants, evaluation of patient's response to treatment, examination of patient, obtaining history from patient or surrogate, ordering and performing treatments and interventions, ordering and review of laboratory studies, ordering and review of radiographic studies, pulse oximetry and re-evaluation of patient's condition. Medications Ordered in ED Medications  pantoprazole (PROTONIX) 80 mg in sodium chloride 0.9 % 100 mL IVPB (has no administration in time range)  pantoprazole (PROTONIX) 80 mg in sodium chloride 0.9 % 100 mL (0.8 mg/mL) infusion (has no administration in time range)    ED Course  I have reviewed the triage vital signs and the nursing notes.  Pertinent labs & imaging results that were available during my care of the patient were reviewed by me and considered in my medical decision making (  see chart for details).    MDM Rules/Calculators/A&P                           Consult: Dr. Roosevelt Locks for admission  Patient presents with black stool.  Rectal exam positive for melena.  Patient's hemoglobin is 7.1.  This is at 1 mg/dL drop from his last baseline.  Patient describes lightheadedness and dizziness with standing consistent with symptomatic anemia.  Plan for admission.  At this time we will hold Eliquis and aspirin until further evaluated by admitting team and consultants. Final Clinical Impression(s) / ED Diagnoses Final diagnoses:   Acute GI bleeding  ESRD (end stage renal disease) on dialysis ([place])  Anticoagulated  Symptomatic anemia    Rx / DC Orders ED Discharge Orders    None       Charlesetta Shanks, MD 07/14/20 1551    Charlesetta Shanks, MD 07/14/20 1612

## 2020-07-14 NOTE — ED Triage Notes (Signed)
Pt reports black stools and RLQ pain x 2-3 days.  He does peritoneal dialysis.

## 2020-07-14 NOTE — ED Notes (Signed)
Patient with PD cath that is currently dwelling and right chest PICC line for ABX at home for sepsis infection, here for 1 week of dark, tarry, sticky stools. He reports about 1 per day, but some days doesn't have any. Was getting better for a few days and then got bad again last night. Patient reports that he has a little bit of tenderness/pain in the right groin, but otherwise no pain. Is on 2 L/min per [place] that he uses all the time at home. Patient with no history of transfusions and no previous GI bleeding. Is on 3 blood thinners and ASA. Reports he is symptomatic with weakness and dizziness when he stands.

## 2020-07-14 NOTE — ED Notes (Signed)
Please call wife, Krystian Younglove, [phone] with patient status as soon as possible.

## 2020-07-14 NOTE — ED Notes (Signed)
RN requested eyedrops for pt

## 2020-07-14 NOTE — ED Notes (Signed)
MD reported to RN that Pt will not get dialysis tonight

## 2020-07-14 NOTE — H&P (Addendum)
History and Physical    [name] WIO:[id number] DOB: April 27, 1946 DOA: 07/14/2020  PCP: Celene Squibb, MD (Confirm with patient/family/NH records and if not entered, this has to be entered at [facility] point of entry) Patient coming from: Home  I have personally briefly reviewed patient's old medical records in [place]  Chief Complaint: Black tarry stool  HPI: [name] is a 75 y.o. 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  Patient denies any abdominal pain, no nausea vomit, no constipation and reported has been passing black and tarry stool almost every day for the last 3 weeks. Denies any feeling of lightheadedness or shortness of breath or chest pain. Yesterday, he has been passing more black tarry stools and in the evening he passed some black stool mixed with cherry red blood which scared him. And wife decided to hold Eliquis since yesterday evening. ED Course: Hemoglobin 7.1 compared to 8.8 2 weeks ago. Orthostatic vital signs negative. No hypoxia.  Review of Systems: As per HPI otherwise 14 point review of systems negative.    Past Medical History:  Diagnosis Date  . Aortic stenosis   . Arthritis   . Atherosclerotic heart disease of native coronary artery without angina pectoris   . CAD (coronary artery disease)   . Chronic obstructive pulmonary disease, unspecified ([place])   . COPD (chronic obstructive pulmonary disease) ([place])   . Depression   . Essential (primary) hypertension   . Gout, unspecified   . Heart murmur   . Hemorrhoid   . History of peritoneal dialysis   . Hyperlipidemia, unspecified   . Hypertension   . Hypokalemia 07/10/2020  .  Hypothyroidism   . Hypothyroidism, unspecified   . Kidney stones   . Major depressive disorder, single episode, unspecified   . Morbid (severe) obesity due to excess calories ([place])   . Nonrheumatic aortic (valve) stenosis   . Orthostatic hypotension   . Syncope   . Thyroid disease     Past Surgical History:  Procedure Laterality Date  . ANGIOPASTY    . AORTIC VALVE REPLACEMENT  07/11/2012   Procedure: AORTIC VALVE REPLACEMENT (AVR);  Surgeon: Gaye Pollack, MD;  Location: [place];  Service: Open Heart Surgery;  Laterality: N/A;  . AV FISTULA PLACEMENT Left 05/30/2020   Procedure: ARTERIOVENOUS (AV) FISTULA CREATION LEFT;  Surgeon: [name], MD;  Location[facility];  Service: Vascular;  Laterality: Left;  . CARDIAC CATHETERIZATION    . CAROTID ENDARTERECTOMY    . CORONARY ARTERY BYPASS GRAFT  07/11/2012   Procedure: CORONARY ARTERY BYPASS GRAFTING (CABG);  Surgeon: Gaye Pollack, MD;  Location: [place];  Service: Open Heart Surgery;  Laterality: N/A;  CABG x one,  using right leg greater saphenous vein harvested endoscopically  . INTRAOPERATIVE TRANSESOPHAGEAL ECHOCARDIOGRAM  07/11/2012   Procedure: INTRAOPERATIVE TRANSESOPHAGEAL ECHOCARDIOGRAM;  Surgeon: Gaye Pollack, MD;  Location: [facility] OR;  Service: Open Heart Surgery;  Laterality: N/A;  . IR PERC TUN PERIT CATH WO PORT S&I Dartha Lodge  06/13/2020  . IR US GUIDE VASC ACCESS RIGHT  06/13/2020  . JOINT REPLACEMENT    . TEE WITHOUT CARDIOVERSION N/A 06/12/2020   Procedure: TRANSESOPHAGEAL ECHOCARDIOGRAM (TEE);  Surgeon: Skeet Latch, MD;  [facility];  Service: Cardiovascular;  Laterality: N/A;     reports that he quit smoking about 9 years ago. His smoking use included cigarettes. He has a 150.00 pack-year smoking history. He has quit using smokeless tobacco. He reports previous alcohol use. He reports that he does not use drugs.  No Known Allergies  Family History  Problem Relation Age of Onset  . Heart disease Mother    . Heart disease Father   . Hypertension Father      Prior to Admission medications   Medication Sig Start Date End Date Taking? Authorizing Provider  albuterol (PROVENTIL HFA;VENTOLIN HFA) 108 (90 BASE) MCG/ACT inhaler Inhale 2 puffs into the lungs every 6 (six) hours as needed for wheezing or shortness of breath.     [provider]  allopurinol (ZYLOPRIM) 300 MG tablet Take 300 mg by mouth daily.    [provider]  amLODipine (NORVASC) 5 MG tablet Take 5 mg by mouth daily.    [provider]  ampicillin IVPB Inject 2 g into the vein every 12 (twelve) hours. 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 06/14/20 07/23/20  Regalado, Jerald Kief A, MD  apixaban (ELIQUIS) 5 MG TABS tablet Take 2 tablets (10 mg total) by mouth 2 (two) times daily for 5 days. 06/14/20 06/19/20  Regalado, Belkys A, MD  apixaban (ELIQUIS) 5 MG TABS tablet Take 1 tablet (5 mg total) by mouth 2 (two) times daily. 06/18/20   Regalado, Jerald Kief A, MD  aspirin EC 81 MG tablet Take 1 tablet (81 mg total) by mouth daily. Swallow whole. 06/14/20 06/14/21  Regalado, Belkys A, MD  calcitRIOL (ROCALTROL) 0.25 MCG capsule Take 0.25 mcg by mouth every other day.  Patient not taking: Reported on 07/09/2020    [provider]  cefTRIAXone (ROCEPHIN) IVPB Inject 2 g into the vein every 12 (twelve) hours. 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 06/14/20 07/23/20  Regalado, Jerald Kief A, MD  Cholecalciferol (VITAMIN D3) 50 MCG (2000 UT) capsule Take 2,000-4,000 Units by mouth See admin instructions. Take 4000 units by mouth in the morning and 2000 units in the evening    [provider]  docusate sodium (COLACE) 100 MG capsule Take 100 mg by mouth 2 (two) times daily.    [provider]  finasteride (PROSCAR) 5 MG tablet Take 5 mg by mouth daily.    [provider]  fluticasone (FLONASE) 50 MCG/ACT nasal spray Place 1 spray into both nostrils daily.    [provider]  furosemide (LASIX) 40 MG tablet Take 40 mg by mouth 2 (two) times daily.    [provider]  ipratropium-albuterol (DUONEB) 0.5-2.5 (3) MG/3ML SOLN SMARTSIG:3 Milliliter(s) Via Nebulizer Every 6 Hours 06/19/20   [provider]  L-Lysine 500 MG TABS Take 500 mg by mouth daily.  [provider]  levothyroxine (SYNTHROID) 175 MCG tablet Take 175 mcg by mouth daily before breakfast.  05/12/12   [provider]  metoprolol tartrate (LOPRESSOR) 25 MG tablet Take 25 mg by mouth 2 (two) times daily.    [provider]  Multiple Minerals-Vitamins (CALCIUM-MAGNESIUM-ZINC-D3) TABS Take 1 tablet by mouth daily.    [provider]  Multiple Vitamin (MULTIVITAMIN) tablet Take 1 tablet by mouth daily.    [provider]  Omega-3 Fatty Acids (FISH OIL PO) Take 2 capsules by mouth daily.     [provider]  pantoprazole (PROTONIX) 40 MG tablet Take 40 mg by mouth daily.    [provider]  polyethylene glycol (MIRALAX / GLYCOLAX) 17 g packet Take 17 g by mouth daily. 06/14/20   Regalado, Belkys A, MD  potassium chloride SA (KLOR-CON) 20 MEQ tablet Take 20 mEq by mouth 2 (two) times daily.     [provider]  sertraline (ZOLOFT) 100 MG tablet Take 100 mg by mouth daily.    [provider]  simvastatin (ZOCOR) 40 MG tablet Take 40 mg by mouth every evening.    [provider]  sodium bicarbonate 650 MG tablet Take 2 tablets (1,300 mg total) by mouth 3 (three) times daily with meals. 06/14/20   Regalado, Belkys A, MD  Travoprost, BAK Free, (TRAVATAN) 0.004 % SOLN ophthalmic solution Place 1 drop into both eyes at bedtime.    [provider]    Physical Exam: Vitals:   07/14/20 1450 07/14/20 1500 07/14/20 1515 07/14/20 1530  BP: 122/88 112/71 126/62 116/65  Pulse: 68 66 69 74  Resp: 17 (!) '21 13 20  ' Temp: 97.9 F (36.6 C)     TempSrc: Oral     SpO2: 100% 100% 100% 97%    Constitutional: NAD, calm, comfortable Vitals:   07/14/20 1450 07/14/20 1500 07/14/20 1515 07/14/20 1530  BP: 122/88 112/71 126/62 116/65  Pulse: 68 66 69 74  Resp: 17 (!) '21 13 20  ' Temp: 97.9 F (36.6 C)     TempSrc: Oral     SpO2: 100% 100% 100% 97%   Eyes: PERRL, lids and conjunctivae normal ENMT: Mucous membranes are moist. Posterior pharynx clear of any exudate or lesions.Normal dentition.  Neck: normal, supple, no masses, no thyromegaly Respiratory: clear to auscultation bilaterally, no wheezing, no crackles. Normal respiratory effort. No accessory muscle use.  Cardiovascular: Regular rate and rhythm, soft systolic murmur on heart base 2+ extremity edema. 2+ pedal pulses. No carotid bruits.  Abdomen: no tenderness, no masses palpated. No hepatosplenomegaly. Bowel sounds positive.  Musculoskeletal: no clubbing / cyanosis. No joint deformity upper and lower extremities. Good ROM, no contractures. Normal muscle tone.  Skin: no rashes, lesions, ulcers. No induration Neurologic: CN 2-12 grossly intact. Sensation intact, DTR normal. Strength 5/5 in all 4.  Psychiatric: Normal judgment and insight. Alert and oriented x 3. Normal mood.     Labs on Admission: I have  personally reviewed following labs and imaging studies  CBC: Recent Labs  Lab 07/14/20 1221  WBC 8.8  HGB 7.1*  HCT 24.3*  MCV 103.0*  PLT 254   Basic Metabolic Panel: Recent Labs  Lab 07/10/20 1830 07/14/20 1221  NA  --  141  K 2.8* 3.6  CL  --  98  CO2  --  31  GLUCOSE  --  97  BUN  --  43*  CREATININE  --  5.59*  CALCIUM  --  8.9  GFR: Estimated Creatinine Clearance: 14.2 mL/min (A) (by C-G formula based on SCr of 5.59 mg/dL (H)). Liver Function Tests: Recent Labs  Lab 07/14/20 1221  AST 26  ALT 15  ALKPHOS 66  BILITOT 0.4  PROT 5.8*  ALBUMIN 2.1*   No results for input(s): LIPASE, AMYLASE in the last 168 hours. No results for input(s): AMMONIA in the last 168 hours. Coagulation Profile: No results for input(s): INR, PROTIME in the last 168 hours. Cardiac Enzymes: No results for input(s): CKTOTAL, CKMB, CKMBINDEX, TROPONINI in the last 168 hours. BNP (last 3 results) No results for input(s): PROBNP in the last 8760 hours. HbA1C: No results for input(s): HGBA1C in the last 72 hours. CBG: No results for input(s): GLUCAP in the last 168 hours. Lipid Profile: No results for input(s): CHOL, HDL, LDLCALC, TRIG, CHOLHDL, LDLDIRECT in the last 72 hours. Thyroid Function Tests: No results for input(s): TSH, T4TOTAL, FREET4, T3FREE, THYROIDAB in the last 72 hours. Anemia Panel: No results for input(s): VITAMINB12, FOLATE, FERRITIN, TIBC, IRON, RETICCTPCT in the last 72 hours. Urine analysis:    Component Value Date/Time   COLORURINE YELLOW 06/08/2020 1859   APPEARANCEUR HAZY (A) 06/08/2020 1859   LABSPEC 1.014 06/08/2020 1859   PHURINE 5.0 06/08/2020 1859   GLUCOSEU 50 (A) 06/08/2020 1859   HGBUR NEGATIVE 06/08/2020 1859   BILIRUBINUR NEGATIVE 06/08/2020 1859   KETONESUR NEGATIVE 06/08/2020 1859   PROTEINUR >=300 (A) 06/08/2020 1859   UROBILINOGEN 0.2 07/11/2012 0545   NITRITE NEGATIVE 06/08/2020 1859   LEUKOCYTESUR NEGATIVE 06/08/2020 1859     Radiological Exams on Admission: No results found.  EKG: Ordered  Assessment/Plan Active Problems:   * No active hospital problems. *  (please populate well all problems here in Problem List. (For example, if patient is on BP meds at home and you resume or decide to hold them, it is a problem that needs to be her. Same for CAD, COPD, HLD and so on)  Acute on chronic macrocytic anemia -Secondary to the bleed, suspect upper GI bleed given finding of black tarry stool. -PPI drip -Vital signs stable, no tachycardia or BP drop. -Consult [facility], Dr. Lyndel Safe aware and plans to see pt in AM. -NPO -Since last dose of Eliquis was given >24 hours ago, and Vitals signs stable, will not order reverse. Hold Eliquis and ASA -Iron, B12. -Discussed with patient and his wife at bedside regarding transfusion given the significant drop of outpatient hemoglobin compared to last month 8.8>7.2.  Both patient and his family prefers not to be transfused right now but recheck H&H then decide. Send H/H repeat 22:00.  Hx of recent PE -Will stop Eliquis -Send baseline D-Dimer, probably can check D-Dimer periodically to monitor his thrombo-embolic risk -DVT study sent, if positive will contact IR for IVF filter.  Recent bacteremia, Enterococcus -Continue ampicillin and ceftriaxone, last dose will be on January 24  Peripheral edema -Nephro to adjust PD regimen  HTN -Hold BP meds for now, will cover with PRN Hydralazine -F/U ID as outpt.  ESRD on PD -Informed Neprho about pt's PD status  COPD -No symptoms or signs of acute exacerbation, continue home breathing regimen   DVT prophylaxis: SCD Code Status: Full Family Communication: Wife at bedside Disposition Plan: Expect 1-2 days hospital stay depending on GI workup. Consults called: GI, Nephro Admission status: Tele admit   Lequita Halt MD [facility] Pager [phone]  07/14/2020, 4:11 PM

## 2020-07-14 NOTE — ED Notes (Signed)
Orthostatic vitals deferred until physician assessment.

## 2020-07-15 ENCOUNTER — Inpatient Hospital Stay (HOSPITAL_COMMUNITY): Payer: Managed Care, Other (non HMO)

## 2020-07-15 ENCOUNTER — Encounter (HOSPITAL_COMMUNITY)
Admission: EM | Disposition: A | Discharge status: Home Health | Payer: Self-pay | Source: Home / Self Care | Attending: Internal Medicine

## 2020-07-15 ENCOUNTER — Other Ambulatory Visit: Payer: Self-pay

## 2020-07-15 ENCOUNTER — Inpatient Hospital Stay (HOSPITAL_COMMUNITY): Payer: Managed Care, Other (non HMO) | Admitting: Anesthesiology

## 2020-07-15 ENCOUNTER — Encounter (HOSPITAL_COMMUNITY): Payer: Self-pay | Admitting: Internal Medicine

## 2020-07-15 DIAGNOSIS — R609 Edema, unspecified: Secondary | ICD-10-CM | POA: Diagnosis not present

## 2020-07-15 DIAGNOSIS — Z7901 Long term (current) use of anticoagulants: Secondary | ICD-10-CM

## 2020-07-15 DIAGNOSIS — I2699 Other pulmonary embolism without acute cor pulmonale: Secondary | ICD-10-CM | POA: Diagnosis not present

## 2020-07-15 DIAGNOSIS — D689 Coagulation defect, unspecified: Secondary | ICD-10-CM

## 2020-07-15 DIAGNOSIS — K921 Melena: Secondary | ICD-10-CM

## 2020-07-15 DIAGNOSIS — D62 Acute posthemorrhagic anemia: Secondary | ICD-10-CM

## 2020-07-15 HISTORY — PX: ESOPHAGOGASTRODUODENOSCOPY (EGD) WITH PROPOFOL: SHX5813

## 2020-07-15 LAB — BASIC METABOLIC PANEL
Anion gap: 13 (ref 5–15)
BUN: 46 mg/dL — ABNORMAL HIGH (ref 8–23)
CO2: 30 mmol/L (ref 22–32)
Calcium: 8.5 mg/dL — ABNORMAL LOW (ref 8.9–10.3)
Chloride: 99 mmol/L (ref 98–111)
Creatinine, Ser: 5.76 mg/dL — ABNORMAL HIGH (ref 0.61–1.24)
GFR, Estimated: 10 mL/min — ABNORMAL LOW (ref >60)
Glucose, Bld: 97 mg/dL (ref 70–99)
Potassium: 4 mmol/L (ref 3.5–5.1)
Sodium: 142 mmol/L (ref 135–145)

## 2020-07-15 LAB — CBC
HCT: 20.4 % — ABNORMAL LOW (ref 39.0–52.0)
Hemoglobin: 5.9 g/dL — CL (ref 13.0–17.0)
MCH: 29.8 pg (ref 26.0–34.0)
MCHC: 28.9 g/dL — ABNORMAL LOW (ref 30.0–36.0)
MCV: 103 fL — ABNORMAL HIGH (ref 80.0–100.0)
Platelets: 198 10*3/uL (ref 150–400)
RBC: 1.98 MIL/uL — ABNORMAL LOW (ref 4.22–5.81)
RDW: 17.5 % — ABNORMAL HIGH (ref 11.5–15.5)
WBC: 8.5 10*3/uL (ref 4.0–10.5)
nRBC: 0.2 % (ref 0.0–0.2)

## 2020-07-15 LAB — HEMOGLOBIN AND HEMATOCRIT, BLOOD
HCT: 27.1 % — ABNORMAL LOW (ref 39.0–52.0)
Hemoglobin: 8.4 g/dL — ABNORMAL LOW (ref 13.0–17.0)

## 2020-07-15 LAB — PREPARE RBC (CROSSMATCH)

## 2020-07-15 SURGERY — ESOPHAGOGASTRODUODENOSCOPY (EGD) WITH PROPOFOL
Anesthesia: Monitor Anesthesia Care

## 2020-07-15 MED ORDER — SODIUM CHLORIDE 0.9 % IV SOLN: INTRAVENOUS | Status: DC

## 2020-07-15 MED ORDER — HEPARIN 1000 UNIT/ML FOR PERITONEAL DIALYSIS
INTRAPERITONEAL | Status: DC | PRN
  Filled 2020-07-15: qty 5000

## 2020-07-15 MED ORDER — HEPARIN 1000 UNIT/ML FOR PERITONEAL DIALYSIS
500.0000 [IU] | INTRAMUSCULAR | Status: DC | PRN
Start: 2020-07-15 — End: 2020-07-15

## 2020-07-15 MED ORDER — GENTAMICIN SULFATE 0.1 % EX CREA
1.0000 "application " | TOPICAL_CREAM | Freq: Every day | CUTANEOUS | Status: DC
  Administered 2020-07-16 – 2020-07-18 (×4): 1 via TOPICAL
  Filled 2020-07-15: qty 15

## 2020-07-15 MED ORDER — DELFLEX-LC/1.5% DEXTROSE 344 MOSM/L IP SOLN: INTRAPERITONEAL | Status: DC

## 2020-07-15 MED ORDER — PEG-KCL-NACL-NASULF-NA ASC-C 100 G PO SOLR
0.5000 | Freq: Once | ORAL | Status: AC
  Administered 2020-07-15: 100 g via ORAL
  Filled 2020-07-15: qty 1

## 2020-07-15 MED ORDER — ALPRAZOLAM 0.25 MG PO TABS
0.1250 mg | ORAL_TABLET | Freq: Every day | ORAL | Status: DC
  Administered 2020-07-17: 0.125 mg via ORAL
  Filled 2020-07-15 (×2): qty 1

## 2020-07-15 MED ORDER — DELFLEX-LC/2.5% DEXTROSE 394 MOSM/L IP SOLN
INTRAPERITONEAL | Status: DC
  Administered 2020-07-16 – 2020-07-17 (×2): 5000 mL via INTRAPERITONEAL

## 2020-07-15 MED ORDER — PROPOFOL 10 MG/ML IV BOLUS
INTRAVENOUS | Status: DC | PRN
  Administered 2020-07-15: 20 mg via INTRAVENOUS

## 2020-07-15 MED ORDER — ALLOPURINOL 100 MG PO TABS
100.0000 mg | ORAL_TABLET | Freq: Every day | ORAL | Status: DC
  Administered 2020-07-16 – 2020-07-18 (×3): 100 mg via ORAL
  Filled 2020-07-15 (×3): qty 1

## 2020-07-15 MED ORDER — SODIUM CHLORIDE 0.9% IV SOLUTION: Freq: Once | INTRAVENOUS | Status: AC

## 2020-07-15 MED ORDER — PROPOFOL 500 MG/50ML IV EMUL
INTRAVENOUS | Status: DC | PRN
  Administered 2020-07-15: 100 ug/kg/min via INTRAVENOUS

## 2020-07-15 MED ORDER — PEG-KCL-NACL-NASULF-NA ASC-C 100 G PO SOLR
0.5000 | Freq: Once | ORAL | Status: AC
  Administered 2020-07-15: 100 g via ORAL

## 2020-07-15 MED ORDER — METOPROLOL TARTRATE 25 MG PO TABS
25.0000 mg | ORAL_TABLET | Freq: Two times a day (BID) | ORAL | Status: DC
  Administered 2020-07-16 – 2020-07-18 (×5): 25 mg via ORAL
  Filled 2020-07-15 (×5): qty 1

## 2020-07-15 SURGICAL SUPPLY — 15 items

## 2020-07-15 NOTE — H&P (View-Only) (Signed)
[facility] Consult Note   History [name] MRN # [id number]  Date of Admission: 07/14/2020 Date of Consultation: 07/15/2020 Referring physician: Dr. Antonieta Pert, MD Primary Care Provider: Celene Squibb, MD Primary Gastroenterologist: None   Reason for Consultation/Chief Complaint: Melena and anemia  Subjective  HPI:  This is a 75 year old man with multiple complex medical issues brought by family for about 3 weeks of melena that was worsening in recent days.  He does not recall a previous history of GI bleeding.  Recently hospitalized for endocarditis and pulmonary embolism, on oral anticoagulation and IV antibiotics.  During the recent hospitalization he also had acute on chronic end-stage renal disease and began peritoneal dialysis. He has not had hematemesis, nausea dysphagia or other chronic upper digestive symptoms.  He describes some crampy lower abdominal discomfort in the last 2 days.  On chronic aspirin but no additional NSAIDs Hemoglobin dropped overnight and he is about 30 minutes into transfusion of first unit of PRBCs at the time of my evaluation.  ROS:  Constitutional: Denies fever Eyes:   None ENT:   None Cardiovascular:   Denies chest pain Respiratory:   Chronic cough from COPD, no recent change Musculoskeletal:   None Urinary:   None Heme/Lymph:   Chronic peripheral edema  All other systems are negative except as noted above in the HPI  Past Medical History Past Medical History:  Diagnosis Date  . Aortic stenosis   . Arthritis   . Atherosclerotic heart disease of native coronary artery without angina pectoris   . CAD (coronary artery disease)   . Chronic obstructive pulmonary disease, unspecified ([place])   . COPD (chronic obstructive pulmonary disease) ([place])   . Depression   . Essential (primary) hypertension   . Gout, unspecified   . Heart murmur   . Hemorrhoid   . History of peritoneal dialysis   . Hyperlipidemia, unspecified   .  Hypertension   . Hypokalemia 07/10/2020  . Hypothyroidism   . Hypothyroidism, unspecified   . Kidney stones   . Major depressive disorder, single episode, unspecified   . Morbid (severe) obesity due to excess calories ([place])   . Nonrheumatic aortic (valve) stenosis   . Orthostatic hypotension   . Syncope   . Thyroid disease     Past Surgical History Past Surgical History:  Procedure Laterality Date  . ANGIOPASTY    . AORTIC VALVE REPLACEMENT  07/11/2012   Procedure: AORTIC VALVE REPLACEMENT (AVR);  Surgeon: Gaye Pollack, MD;  Location: [place];  Service: Open Heart Surgery;  Laterality: N/A;  . AV FISTULA PLACEMENT Left 05/30/2020   Procedure: ARTERIOVENOUS (AV) FISTULA CREATION LEFT;  Surgeon: [name], MD;  Location[facility];  Service: Vascular;  Laterality: Left;  . CARDIAC CATHETERIZATION    . CAROTID ENDARTERECTOMY    . CORONARY ARTERY BYPASS GRAFT  07/11/2012   Procedure: CORONARY ARTERY BYPASS GRAFTING (CABG);  Surgeon: Gaye Pollack, MD;  Location: [place];  Service: Open Heart Surgery;  Laterality: N/A;  CABG x one,  using right leg greater saphenous vein harvested endoscopically  . INTRAOPERATIVE TRANSESOPHAGEAL ECHOCARDIOGRAM  07/11/2012   Procedure: INTRAOPERATIVE TRANSESOPHAGEAL ECHOCARDIOGRAM;  Surgeon: Gaye Pollack, MD;  Location: [facility];  Service: Open Heart Surgery;  Laterality: N/A;  . IR PERC TUN PERIT CATH WO PORT S&I Dartha Lodge  06/13/2020  . IR US GUIDE VASC ACCESS RIGHT  06/13/2020  . JOINT REPLACEMENT    . TEE WITHOUT CARDIOVERSION N/A 06/12/2020   Procedure:  TRANSESOPHAGEAL ECHOCARDIOGRAM (TEE);  Surgeon: Skeet Latch, MD;  [facility];  Service: Cardiovascular;  Laterality: N/A;    Family History Family History  Problem Relation Age of Onset  . Heart disease Mother   . Heart disease Father   . Hypertension Father     Social History Social History   Socioeconomic History  . Marital status: Married    Spouse name: Not on file  . Number  of children: 1  . Years of education: HS  . Highest education level: Not on file  Occupational History  . Occupation: Retired   Tobacco Use  . Smoking status: Former Smoker    Packs/day: 3.00    Years: 50.00    Pack years: 150.00    Types: Cigarettes    Quit date: 12/13/2010    Years since quitting: 9.5  . Smokeless tobacco: Former Systems developer  . Tobacco comment: Electronic cigarette...USES INFREQUENTLY NOW  Vaping Use  . Vaping Use: Former  . Start date: 02/01/2013  . Quit date: 06/08/2020  Substance and Sexual Activity  . Alcohol use: Not Currently    Comment: Rare  . Drug use: No  . Sexual activity: Not Currently  Other Topics Concern  . Not on file  Social History Narrative   Drinks about 3 cups of coffee a day, drinks about 2 sundrops a day    Social Determinants of Health   Financial Resource Strain: Not on file  Food Insecurity: Not on file  Transportation Needs: Not on file  Physical Activity: Not on file  Stress: Not on file  Social Connections: Not on file    Allergies No Known Allergies  Outpatient Meds Home medications from the H+P and/or nursing med reconciliation reviewed.  Inpatient med list reviewed  _____________________________________________________________________ Objective   Exam:  Current vital signs  Patient Vitals for the past 8 hrs:  BP Temp Temp src Pulse Resp SpO2  07/15/20 0930 109/62 - - 79 20 100 %  07/15/20 0915 102/62 - - 79 17 100 %  07/15/20 0845 (!) 102/57 - - 76 12 99 %  07/15/20 0830 106/61 98 F (36.7 C) Oral 73 (!) 21 100 %  07/15/20 0820 100/65 - - 75 14 100 %  07/15/20 0815 101/70 - - 70 18 99 %  07/15/20 0805 108/65 98 F (36.7 C) Oral 74 16 98 %  07/15/20 0805 108/65 98 F (36.7 C) Oral 74 18 -  07/15/20 0615 110/61 98.2 F (36.8 C) Oral 72 14 97 %  07/15/20 0230 111/66 - - 78 17 96 %    Intake/Output Summary (Last 24 hours) at 07/15/2020 0946 Last data filed at 07/15/2020 0805 Gross per 24 hour  Intake 650 ml   Output -  Net 650 ml    Physical Exam:    General: this is a alert and chronically ill-appearing elderly male patient in no acute distress  Eyes: sclera anicteric, no redness  ENT: oral mucosa moist without lesions, no cervical or supraclavicular lymphadenopathy  CV: RRR with 2/6 systolic murmur, no JVD,, + pitting peripheral edema.  Right upper chest wall tunneled central venous catheter  Resp: Scattered inspiratory and expiratory wheezing bilaterally, normal RR and effort noted.  No respiratory distress  GI: soft, no tenderness, with active bowel sounds. No guarding or palpable organomegaly noted.  Right-sided peritoneal dialysis catheter without surrounding erythema or tenderness  Skin; warm and dry, no rash or jaundice noted  Neuro: awake, alert and oriented x 3. Normal gross motor function  and fluent speech.  Labs:  CBC Latest Ref Rng & Units 07/15/2020 07/14/2020 06/14/2020  WBC 4.0 - 10.5 K/uL 8.5 8.8 16.2(H)  Hemoglobin 13.0 - 17.0 g/dL 5.9(LL) 7.1(L) 8.8(L)  Hematocrit 39.0 - 52.0 % 20.4(L) 24.3(L) 26.6(L)  Platelets 150 - 400 K/uL 198 224 240    CMP Latest Ref Rng & Units 07/15/2020 07/14/2020 07/10/2020  Glucose 70 - 99 mg/dL 97 97 -  BUN 8 - 23 mg/dL 46(H) 43(H) -  Creatinine 0.61 - 1.24 mg/dL 5.76(H) 5.59(H) -  Sodium 135 - 145 mmol/L 142 141 -  Potassium 3.5 - 5.1 mmol/L 4.0 3.6 2.8(L)  Chloride 98 - 111 mmol/L 99 98 -  CO2 22 - 32 mmol/L 30 31 -  Calcium 8.9 - 10.3 mg/dL 8.5(L) 8.9 -  Total Protein 6.5 - 8.1 g/dL - 5.8(L) -  Total Bilirubin 0.3 - 1.2 mg/dL - 0.4 -  Alkaline Phos 38 - 126 U/L - 66 -  AST 15 - 41 U/L - 26 -  ALT 0 - 44 U/L - 15 -    Recent Labs  Lab 07/14/20 1641  INR 1.5*   _________________________________________________________ Radiologic studies:  COVID-negative ______________________________________________________ Other studies:   _______________________________________________________ Assessment & Plan   Impression:  Melena Acute blood loss anemia Prosthetic valve endocarditis Recent pulmonary embolism on oral anticoagulation.  Last dose on 07/13/2020 (clearance possibly prolonged by renal disease) COPD  Suspect upper GI bleed, possibly peptic ulcer and patient on concomitant aspirin and [place].  He has never had a prior colonoscopy that he recalls.  Right-sided colonic source of bleeding is considered less likely. He is hemodynamically stable from his GI bleed.  Oral anticoagulation effect should be about washed out by now.  Mild consumptive coagulopathy due to GI bleeding.   Plan:  I recommended he undergo upper endoscopy.  Procedure was described in detail along with risks and benefits and he was agreeable.  The benefits and risks of the planned procedure were described in detail with the patient or (when appropriate) their health care proxy.  Risks were outlined as including, but not limited to, bleeding, infection, perforation, adverse medication reaction leading to cardiac or pulmonary decompensation, pancreatitis (if ERCP).  The limitation of incomplete mucosal visualization was also discussed.  No guarantees or warranties were given.  Patient at increased risk for cardiopulmonary complications of procedure due to medical comorbidities.  We are tentatively planning for mid afternoon after patient has had time to receive 2 units of PRBCs slowly and ensure cardiovascular and respiratory stability afterwards.  (Wife updated)  Thank you for the courtesy of this consult.  Please contact me with any questions or concerns.  Nelida Meuse III Office: [phone]

## 2020-07-15 NOTE — ED Notes (Signed)
Attempted to call MD in regards to critical hemoglobin. Unable to make contact with the provider at this time. Will attempt to contact at a different time.

## 2020-07-15 NOTE — Anesthesia Postprocedure Evaluation (Signed)
Anesthesia Post Note  Patient: [name]  Procedure(s) Performed: ESOPHAGOGASTRODUODENOSCOPY (EGD) WITH PROPOFOL (N/A )     Patient location during evaluation: Endoscopy Anesthesia Type: MAC Level of consciousness: awake and alert Pain management: pain level controlled Vital Signs Assessment: post-procedure vital signs reviewed and stable Respiratory status: spontaneous breathing and respiratory function stable Cardiovascular status: stable Postop Assessment: no apparent nausea or vomiting Anesthetic complications: no   No complications documented.  Last Vitals:  Vitals:   07/15/20 1243 07/15/20 1250  BP: (!) 97/45 (!) 104/51  Pulse: 67 68  Resp: 13 13  Temp:    SpO2: 100% 98%    Last Pain:  Vitals:   07/15/20 1250  TempSrc:   PainSc: 0-No pain                 Christia Coaxum DANIEL

## 2020-07-15 NOTE — Consult Note (Signed)
[facility] Renal Consultation Note  Indication for Consultation:  Management of ESRD/hemodialysis; anemia, hypertension/volume and secondary hyperparathyroidism  HPI: [name] is a 75 y.o. male with ESRD on CCPD, followed in [place] by  Dr. Juleen China at home therapies.  Does have a left RC AV fistula placed 05/30/2020 Dr. 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  He denies abdominal pain nausea vomiting diarrhea dyspnea shortness of breath chest pain.  States CCPD has been going well In ER Hgb 7.1 initially then dropped to 5.9 this morning compared to 8.2 previously.  BUN 46, CR 5.76 CO2 30 K4.0, calcium 8.5 ALB 2.1 He admitted with GI bleed noted GI consulted upper endoscopy normal today for colonoscopy tomorrow  We are consulted CCP/ESRD issues      Past Medical History:  Diagnosis Date  . Aortic stenosis   . Arthritis   . Atherosclerotic heart disease of native coronary artery without angina pectoris   . CAD (coronary artery disease)   . Chronic obstructive pulmonary disease, unspecified ([place])   . COPD (chronic obstructive pulmonary disease) ([place])   . Depression   . Essential (primary) hypertension   . Gout, unspecified   . Heart murmur   . Hemorrhoid   . History of peritoneal dialysis   . Hyperlipidemia, unspecified   . Hypertension   . Hypokalemia 07/10/2020  . Hypothyroidism   . Hypothyroidism, unspecified   . Kidney stones   . Major depressive disorder, single episode, unspecified   . Morbid (severe) obesity due to excess calories ([place])   . Nonrheumatic aortic (valve) stenosis   . Orthostatic hypotension    . Syncope   . Thyroid disease     Past Surgical History:  Procedure Laterality Date  . ANGIOPASTY    . AORTIC VALVE REPLACEMENT  07/11/2012   Procedure: AORTIC VALVE REPLACEMENT (AVR);  Surgeon: Gaye Pollack, MD;  Location: [facility];  Service: Open Heart Surgery;  Laterality: N/A;  . AV FISTULA PLACEMENT Left 05/30/2020   Procedure: ARTERIOVENOUS (AV) FISTULA CREATION LEFT;  Surgeon: [name], MD;  Location: [facility];  Service: Vascular;  Laterality: Left;  . CARDIAC CATHETERIZATION    . CAROTID ENDARTERECTOMY    . CORONARY ARTERY BYPASS GRAFT  07/11/2012   Procedure: CORONARY ARTERY BYPASS GRAFTING (CABG);  Surgeon: Gaye Pollack, MD;  Location: [facility];  Service: Open Heart Surgery;  Laterality: N/A;  CABG x one,  using right leg greater saphenous vein harvested endoscopically  . INTRAOPERATIVE TRANSESOPHAGEAL ECHOCARDIOGRAM  07/11/2012   Procedure: INTRAOPERATIVE TRANSESOPHAGEAL ECHOCARDIOGRAM;  Surgeon: Gaye Pollack, MD;  Location: [facility] OR;  Service: Open Heart Surgery;  Laterality: N/A;  . IR PERC TUN PERIT CATH WO PORT S&I Dartha Lodge  06/13/2020  . IR US GUIDE VASC ACCESS RIGHT  06/13/2020  . JOINT REPLACEMENT    . TEE WITHOUT CARDIOVERSION N/A 06/12/2020   Procedure: TRANSESOPHAGEAL ECHOCARDIOGRAM (TEE);  Surgeon: Skeet Latch, MD;  Location: [facility] ENDOSCOPY;  Service: Cardiovascular;  Laterality: N/A;      Family History  Problem Relation Age of Onset  . Heart disease Mother   . Heart disease Father   . Hypertension Father       reports that he  quit smoking about 9 years ago. His smoking use included cigarettes. He has a 150.00 pack-year smoking history. He has quit using smokeless tobacco. He reports previous alcohol use. He reports that he does not use drugs.  No Known Allergies  Prior to Admission medications   Medication Sig Start Date End Date Taking? Authorizing Provider  albuterol (PROVENTIL HFA;VENTOLIN HFA) 108 (90 BASE) MCG/ACT inhaler Inhale 2 puffs into the lungs  every 6 (six) hours as needed for wheezing or shortness of breath.    Yes [provider]  allopurinol (ZYLOPRIM) 300 MG tablet Take 300 mg by mouth daily.   Yes [provider]  ALPRAZolam (XANAX) 0.25 MG tablet Take 0.125 mg by mouth at bedtime. 07/11/20  Yes [provider]  amLODipine (NORVASC) 5 MG tablet Take 5 mg by mouth daily.   Yes [provider]  ampicillin IVPB Inject 2 g into the vein every 12 (twelve) hours. 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 06/14/20 07/23/20 Yes Regalado, Belkys A, MD  apixaban (ELIQUIS) 5 MG TABS tablet Take 1 tablet (5 mg total) by mouth 2 (two) times daily. 06/18/20  Yes Regalado, Belkys A, MD  aspirin EC 81 MG tablet Take 1 tablet (81 mg total) by mouth daily. Swallow whole. 06/14/20 06/14/21 Yes Regalado, Belkys A, MD  cefTRIAXone (ROCEPHIN) IVPB Inject 2 g into the vein every 12 (twelve) hours. 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 06/14/20 07/23/20 Yes Regalado, Belkys A, MD  Cholecalciferol (VITAMIN D3) 50 MCG (2000 UT) capsule Take 2,000 Units by mouth See admin instructions.   Yes [provider]  docusate sodium (COLACE) 100 MG capsule Take 100 mg by mouth daily.   Yes  [provider]  finasteride (PROSCAR) 5 MG tablet Take 5 mg by mouth daily.   Yes [provider]  fluticasone (FLONASE) 50 MCG/ACT nasal spray Place 1 spray into both nostrils daily.   Yes [provider]  furosemide (LASIX) 40 MG tablet Take 40 mg by mouth 2 (two) times daily.   Yes [provider]  ipratropium-albuterol (DUONEB) 0.5-2.5 (3) MG/3ML SOLN Take 3 mLs by nebulization every 6 (six) hours as needed (shortness of breath). 06/19/20  Yes [provider]  L-Lysine 500 MG TABS Take 500 mg by mouth daily.    Yes [provider]  levothyroxine (SYNTHROID) 175 MCG tablet Take 175 mcg by mouth daily before breakfast.  05/12/12  Yes [provider]  metoprolol tartrate (LOPRESSOR) 25 MG tablet Take 25 mg by mouth 2 (two) times daily.   Yes [provider]  Multiple Minerals-Vitamins (CALCIUM-MAGNESIUM-ZINC-D3) TABS Take 1 tablet by mouth daily.   Yes [provider]  Multiple Vitamin (MULTIVITAMIN) tablet Take 1 tablet by mouth daily.   Yes [provider]  Omega-3 Fatty Acids (FISH OIL PO) Take 2  capsules by mouth daily.    Yes [provider]  pantoprazole (PROTONIX) 40 MG tablet Take 40 mg by mouth daily.   Yes [provider]  polyethylene glycol (MIRALAX / GLYCOLAX) 17 g packet Take 17 g by mouth daily. Patient taking differently: Take 17 g by mouth daily as needed for mild constipation. 06/14/20  Yes Regalado, Belkys A, MD  potassium chloride SA (KLOR-CON) 20 MEQ tablet Take 20 mEq by mouth 2 (two) times daily.   Yes [provider]  sertraline (ZOLOFT) 100 MG tablet Take 100 mg by mouth daily.   Yes [provider]  simvastatin (ZOCOR) 40 MG tablet Take 40 mg by mouth every evening.   Yes [provider]  sodium bicarbonate 650 MG tablet Take 2 tablets (1,300 mg total) by mouth 3 (three) times daily with meals. 06/14/20  Yes Regalado, Belkys A, MD   Travoprost, BAK Free, (TRAVATAN) 0.004 % SOLN ophthalmic solution Place 1 drop into both eyes at bedtime.   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 First Dose: Yes Last Day of Therapy:  07/22/2020 Labs - Once weekly:  CBC/D and BMP, Labs - Every other week:  ESR and CRP Method of administration: Ambulatory Pump (Continuous Infusion) Method of administration may be changed at the discretion of   2 g Intravenous Every 12 hours 07/14/20 1711 07/14/20 1732   07/14/20 2000  ampicillin (OMNIPEN) 2 g in sodium chloride 0.9 % 100 mL IVPB        2 g 300 mL/hr over 20 Minutes Intravenous Every 12 hours 07/14/20 1732     07/14/20 2000  cefTRIAXone (ROCEPHIN) 2 g in sodium chloride 0.9 % 100 mL IVPB        2 g 200 mL/hr over 30 Minutes Intravenous Every 12 hours 07/14/20 1732     07/14/20 1715  cefTRIAXone (ROCEPHIN) IVPB  Status:  Discontinued       Note to Pharmacy: 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 First Dose: Yes Last Day of Therapy:  07/22/2020 Labs - Once weekly:  CBC/D and BMP, Labs - Every other week:  ESR and CRP Method of administration: IV Push Method of administration may be changed at the discretion of home infusion pharmacist base   2 g Intravenous Every 12 hours 07/14/20 1711 07/14/20 1732      Results for orders placed or performed during the hospital encounter of 07/14/20 (from the past 48 hour(s))  Type and screen [place]     Status: None (Preliminary result)   Collection Time: 07/14/20 11:54 AM  Result Value Ref Range   ABO/RH(D) A NEG    Antibody Screen NEG    Sample Expiration 07/17/2020,2359    Unit Number [id number]    Blood Component Type RED CELLS,LR    Unit division 00    Status of Unit ISSUED    Transfusion Status OK  TO TRANSFUSE    Crossmatch Result      Compatible Performed at [facility], [place] [address] [place],  [address]    Unit Number [id number]    Blood Component Type RED CELLS,LR    Unit division 00    Status of Unit ISSUED    Transfusion Status OK TO TRANSFUSE  Crossmatch Result Compatible   Comprehensive metabolic panel     Status: Abnormal   Collection Time: 07/14/20 12:21 PM  Result Value Ref Range   Sodium 141 135 - 145 mmol/L   Potassium 3.6 3.5 - 5.1 mmol/L   Chloride 98 98 - 111 mmol/L   CO2 31 22 - 32 mmol/L   Glucose, Bld 97 70 - 99 mg/dL    Comment: Glucose reference range applies only to samples taken after fasting for at least 8 hours.   BUN 43 (H) 8 - 23 mg/dL   Creatinine, Ser 5.59 (H) 0.61 - 1.24 mg/dL   Calcium 8.9 8.9 - 10.3 mg/dL   Total Protein 5.8 (L) 6.5 - 8.1 g/dL   Albumin 2.1 (L) 3.5 - 5.0 g/dL   AST 26 15 - 41 U/L   ALT 15 0 - 44 U/L   Alkaline Phosphatase 66 38 - 126 U/L   Total Bilirubin 0.4 0.3 - 1.2 mg/dL   GFR, Estimated 10 (L) >60 mL/min    Comment: (NOTE) Calculated using the CKD-EPI Creatinine Equation (2021)    Anion gap 12 5 - 15    Comment: Performed at [facility], [place] [address] [place], [place] [address]  CBC     Status: Abnormal   Collection Time: 07/14/20 12:21 PM  Result Value Ref Range   WBC 8.8 4.0 - 10.5 K/uL   RBC 2.36 (L) 4.22 - 5.81 MIL/uL   Hemoglobin 7.1 (L) 13.0 - 17.0 g/dL   HCT 24.3 (L) 39.0 - 52.0 %   MCV 103.0 (H) 80.0 - 100.0 fL   MCH 30.1 26.0 - 34.0 pg   MCHC 29.2 (L) 30.0 - 36.0 g/dL   RDW 17.7 (H) 11.5 - 15.5 %   Platelets 224 150 - 400 K/uL   nRBC 0.3 (H) 0.0 - 0.2 %    Comment: Performed at [facility], [place] [address] [place], [place] [address]  Lipase, blood     Status: None   Collection Time: 07/14/20 12:21 PM  Result Value Ref Range   Lipase 42 11 - 51 U/L    Comment: Performed at [facility] [address], [place], [place] [address]  SARS CORONAVIRUS 2 (TAT  6-24 HRS) Nasopharyngeal Nasopharyngeal Swab     Status: None   Collection Time: 07/14/20  3:37 PM   Specimen: Nasopharyngeal Swab  Result Value Ref Range   SARS Coronavirus 2 NEGATIVE NEGATIVE    Comment: (NOTE) SARS-CoV-2 target nucleic acids are NOT DETECTED.  The SARS-CoV-2 RNA is generally detectable in upper and lower respiratory specimens during the acute phase of infection. Negative results do not preclude SARS-CoV-2 infection, do not rule out co-infections with other pathogens, and should not be used as the sole basis for treatment or other patient management decisions. Negative results must be combined with clinical observations, patient history, and epidemiological information. The expected result is Negative.  Fact Sheet for Patients: SugarRoll.be  Fact Sheet for Healthcare Providers: https://www.woods-mathews.com/  This test is not yet approved or cleared by the [place] FDA and  has been authorized for detection and/or diagnosis of SARS-CoV-2 by FDA under an Emergency Use Authorization (EUA). This EUA will remain  in effect (meaning this test can be used) for the duration of the COVID-19 declaration under Se ction 564(b)(1) of the Act, 21 U.S.C. section 360bbb-3(b)(1), unless the authorization is terminated or revoked sooner.  Performed at [facility], [place] [address],  [place], [place] [address]   Protime-INR     Status: Abnormal   Collection Time: 07/14/20  4:41 PM  Result Value Ref Range   Prothrombin Time 17.9 (H) 11.4 - 15.2 seconds   INR 1.5 (H) 0.8 - 1.2    Comment: (NOTE) INR goal varies based on device and disease states. Performed at [facility], [place] [address], [place], [place] [address]   D-dimer, quantitative (not at [facility])     Status: Abnormal   Collection Time: 07/14/20  4:41 PM  Result Value Ref Range   D-Dimer, Quant 1.42 (H) 0.00 - 0.50 ug/mL-FEU    Comment: (NOTE) At the  manufacturer cut-off value of 0.5 g/mL FEU, this assay has a negative predictive value of 95-100%.This assay is intended for use in conjunction with a clinical pretest probability (PTP) assessment model to exclude pulmonary embolism (PE) and deep venous thrombosis (DVT) in outpatients suspected of PE or DVT. Results should be correlated with clinical presentation. Performed at [facility], [place] [address] [place], [place] [address]   POC occult blood, ED     Status: Abnormal   Collection Time: 07/14/20  5:02 PM  Result Value Ref Range   Fecal Occult Bld POSITIVE (A) NEGATIVE  Basic metabolic panel     Status: Abnormal   Collection Time: 07/15/20  6:10 AM  Result Value Ref Range   Sodium 142 135 - 145 mmol/L   Potassium 4.0 3.5 - 5.1 mmol/L   Chloride 99 98 - 111 mmol/L   CO2 30 22 - 32 mmol/L   Glucose, Bld 97 70 - 99 mg/dL    Comment: Glucose reference range applies only to samples taken after fasting for at least 8 hours.   BUN 46 (H) 8 - 23 mg/dL   Creatinine, Ser 5.76 (H) 0.61 - 1.24 mg/dL   Calcium 8.5 (L) 8.9 - 10.3 mg/dL   GFR, Estimated 10 (L) >60 mL/min    Comment: (NOTE) Calculated using the CKD-EPI Creatinine Equation (2021)    Anion gap 13 5 - 15    Comment: Performed at [facility] [address] [place] [address]  CBC     Status: Abnormal   Collection Time: 07/15/20  6:10 AM  Result Value Ref Range   WBC 8.5 4.0 - 10.5 K/uL   RBC 1.98 (L) 4.22 - 5.81 MIL/uL   Hemoglobin 5.9 (LL) 13.0 - 17.0 g/dL    Comment: REPEATED TO VERIFY THIS CRITICAL RESULT HAS VERIFIED AND BEEN CALLED TO KATIE THURMOND,RN BY ZELDA BEECH ON 01 17 2022 AT 0709, AND HAS BEEN READ BACK.     HCT 20.4 (L) 39.0 - 52.0 %   MCV 103.0 (H) 80.0 - 100.0 fL   MCH 29.8 26.0 - 34.0 pg   MCHC 28.9 (L) 30.0 - 36.0 g/dL   RDW 17.5 (H) 11.5 - 15.5 %   Platelets 198 150 - 400 K/uL   nRBC 0.2 0.0 - 0.2 %    Comment: Performed at [facility] [address],  [place], [place] [address]  Prepare RBC (crossmatch)     Status: None   Collection Time: 07/15/20  7:26 AM  Result Value Ref Range   Order Confirmation      ORDER PROCESSED BY BLOOD BANK Performed at [facility], [place] [address] [place], [place] [address]      ROS see HPI   Physical Exam: Vitals:   07/15/20 1243 07/15/20 1250  BP: (!) 97/45 (!) 104/51  Pulse: 67 68  Resp: 13 13  Temp:    SpO2: 100% 98%     General: Alert obese white male in ER stretcher NAD HEENT: [place], EOMI, PERRLA, nonicteric, MMM Neck: Supple, no JVD Heart: RRR, 2/6 stock ejection murmur, no rub no gallop Lungs: CTA, nasal cannula oxygen nonlabored breathing Abdomen: Obese bowel sounds normoactive soft nontender nondistended PD catheter in place no discharge appreciated nontender Extremities: Trace bipedal edema Skin: Warm dry no overt rash or ulcers appreciated Neuro: Alert orient x3 moves all extremities no asterixis appreciated Dialysis Access: Left forearm AV fistula positive bruit developing PD catheter in  Dialysis Orders: Center: obtain CCPD orders   Assessment/Plan 1. GI bleed-EGD negative today colonoscopy tomorrow noted was on Eliquis plans per admit/GI 2. ESRD -CCPD tonight K and volume okay, also we will continue Lasix and sodium bicarb for now follow-up labs 3. Hypertension/volume  -BP lowish but stable in ER systolic low 500T volume okay 4. Anemia of ESRD/GI bleed= start Aranesp obtain iron studies 5. Metabolic bone disease -on cholecalciferol q. monthly, no phosphorus binder  listed on home meds follow-up labs 6. COPD continue meds per minute 7. History of recent PE on Eliquis-had stopped because of GI bleed plan for admit 8. Recent Enterococcus bacteremia on ampicillin and ceftriaxone last dose January 24 9. History of coronary disease status post CABG and  AVR 2014  Ernest Haber, PA-C [place] [phone] 07/15/2020, 3:32 PM

## 2020-07-15 NOTE — Consult Note (Signed)
[facility] Consult Note   History [name] MRN # [id number]  Date of Admission: 07/14/2020 Date of Consultation: 07/15/2020 Referring physician: Dr. Antonieta Pert, MD Primary Care Provider: Celene Squibb, MD Primary Gastroenterologist: None   Reason for Consultation/Chief Complaint: Melena and anemia  Subjective  HPI:  This is a 75 year old man with multiple complex medical issues brought by family for about 3 weeks of melena that was worsening in recent days.  He does not recall a previous history of GI bleeding.  Recently hospitalized for endocarditis and pulmonary embolism, on oral anticoagulation and IV antibiotics.  During the recent hospitalization he also had acute on chronic end-stage renal disease and began peritoneal dialysis. He has not had hematemesis, nausea dysphagia or other chronic upper digestive symptoms.  He describes some crampy lower abdominal discomfort in the last 2 days.  On chronic aspirin but no additional NSAIDs Hemoglobin dropped overnight and he is about 30 minutes into transfusion of first unit of PRBCs at the time of my evaluation.  ROS:  Constitutional: Denies fever Eyes:   None ENT:   None Cardiovascular:   Denies chest pain Respiratory:   Chronic cough from COPD, no recent change Musculoskeletal:   None Urinary:   None Heme/Lymph:   Chronic peripheral edema  All other systems are negative except as noted above in the HPI  Past Medical History Past Medical History:  Diagnosis Date  . Aortic stenosis   . Arthritis   . Atherosclerotic heart disease of native coronary artery without angina pectoris   . CAD (coronary artery disease)   . Chronic obstructive pulmonary disease, unspecified ([place])   . COPD (chronic obstructive pulmonary disease) ([place])   . Depression   . Essential (primary) hypertension   . Gout, unspecified   . Heart murmur   . Hemorrhoid   . History of peritoneal dialysis   . Hyperlipidemia, unspecified   .  Hypertension   . Hypokalemia 07/10/2020  . Hypothyroidism   . Hypothyroidism, unspecified   . Kidney stones   . Major depressive disorder, single episode, unspecified   . Morbid (severe) obesity due to excess calories ([place])   . Nonrheumatic aortic (valve) stenosis   . Orthostatic hypotension   . Syncope   . Thyroid disease     Past Surgical History Past Surgical History:  Procedure Laterality Date  . ANGIOPASTY    . AORTIC VALVE REPLACEMENT  07/11/2012   Procedure: AORTIC VALVE REPLACEMENT (AVR);  Surgeon: Gaye Pollack, MD;  Location: [facility];  Service: Open Heart Surgery;  Laterality: N/A;  . AV FISTULA PLACEMENT Left 05/30/2020   Procedure: ARTERIOVENOUS (AV) FISTULA CREATION LEFT;  Surgeon: [name], MD;  Location: [facility];  Service: Vascular;  Laterality: Left;  . CARDIAC CATHETERIZATION    . CAROTID ENDARTERECTOMY    . CORONARY ARTERY BYPASS GRAFT  07/11/2012   Procedure: CORONARY ARTERY BYPASS GRAFTING (CABG);  Surgeon: Gaye Pollack, MD;  Location: [facility];  Service: Open Heart Surgery;  Laterality: N/A;  CABG x one,  using right leg greater saphenous vein harvested endoscopically  . INTRAOPERATIVE TRANSESOPHAGEAL ECHOCARDIOGRAM  07/11/2012   Procedure: INTRAOPERATIVE TRANSESOPHAGEAL ECHOCARDIOGRAM;  Surgeon: Gaye Pollack, MD;  Location: [facility] OR;  Service: Open Heart Surgery;  Laterality: N/A;  . IR PERC TUN PERIT CATH WO PORT S&I Dartha Lodge  06/13/2020  . IR US GUIDE VASC ACCESS RIGHT  06/13/2020  . JOINT REPLACEMENT    . TEE WITHOUT CARDIOVERSION N/A 06/12/2020   Procedure:  TRANSESOPHAGEAL ECHOCARDIOGRAM (TEE);  Surgeon: Skeet Latch, MD;  Location: [facility] ENDOSCOPY;  Service: Cardiovascular;  Laterality: N/A;    Family History Family History  Problem Relation Age of Onset  . Heart disease Mother   . Heart disease Father   . Hypertension Father     Social History Social History   Socioeconomic History  . Marital status: Married    Spouse name: Not on file  . Number  of children: 1  . Years of education: HS  . Highest education level: Not on file  Occupational History  . Occupation: Retired   Tobacco Use  . Smoking status: Former Smoker    Packs/day: 3.00    Years: 50.00    Pack years: 150.00    Types: Cigarettes    Quit date: 12/13/2010    Years since quitting: 9.5  . Smokeless tobacco: Former Systems developer  . Tobacco comment: Electronic cigarette...USES INFREQUENTLY NOW  Vaping Use  . Vaping Use: Former  . Start date: 02/01/2013  . Quit date: 06/08/2020  Substance and Sexual Activity  . Alcohol use: Not Currently    Comment: Rare  . Drug use: No  . Sexual activity: Not Currently  Other Topics Concern  . Not on file  Social History Narrative   Drinks about 3 cups of coffee a day, drinks about 2 sundrops a day    Social Determinants of Health   Financial Resource Strain: Not on file  Food Insecurity: Not on file  Transportation Needs: Not on file  Physical Activity: Not on file  Stress: Not on file  Social Connections: Not on file    Allergies No Known Allergies  Outpatient Meds Home medications from the H+P and/or nursing med reconciliation reviewed.  Inpatient med list reviewed  _____________________________________________________________________ Objective   Exam:  Current vital signs  Patient Vitals for the past 8 hrs:  BP Temp Temp src Pulse Resp SpO2  07/15/20 0930 109/62 -- -- 79 20 100 %  07/15/20 0915 102/62 -- -- 79 17 100 %  07/15/20 0845 (!) 102/57 -- -- 76 12 99 %  07/15/20 0830 106/61 98 F (36.7 C) Oral 73 (!) 21 100 %  07/15/20 0820 100/65 -- -- 75 14 100 %  07/15/20 0815 101/70 -- -- 70 18 99 %  07/15/20 0805 108/65 98 F (36.7 C) Oral 74 16 98 %  07/15/20 0805 108/65 98 F (36.7 C) Oral 74 18 --  07/15/20 0615 110/61 98.2 F (36.8 C) Oral 72 14 97 %  07/15/20 0230 111/66 -- -- 78 17 96 %    Intake/Output Summary (Last 24 hours) at 07/15/2020 0946 Last data filed at 07/15/2020 0805 Gross per 24 hour   Intake 650 ml  Output --  Net 650 ml    Physical Exam:    General: this is a alert and chronically ill-appearing elderly male patient in no acute distress  Eyes: sclera anicteric, no redness  ENT: oral mucosa moist without lesions, no cervical or supraclavicular lymphadenopathy  CV: RRR with 2/6 systolic murmur, no JVD,, + pitting peripheral edema.  Right upper chest wall tunneled central venous catheter  Resp: Scattered inspiratory and expiratory wheezing bilaterally, normal RR and effort noted.  No respiratory distress  GI: soft, no tenderness, with active bowel sounds. No guarding or palpable organomegaly noted.  Right-sided peritoneal dialysis catheter without surrounding erythema or tenderness  Skin; warm and dry, no rash or jaundice noted  Neuro: awake, alert and oriented x 3. Normal gross motor function  and fluent speech.  Labs:  CBC Latest Ref Rng & Units 07/15/2020 07/14/2020 06/14/2020  WBC 4.0 - 10.5 K/uL 8.5 8.8 16.2(H)  Hemoglobin 13.0 - 17.0 g/dL 5.9(LL) 7.1(L) 8.8(L)  Hematocrit 39.0 - 52.0 % 20.4(L) 24.3(L) 26.6(L)  Platelets 150 - 400 K/uL 198 224 240    CMP Latest Ref Rng & Units 07/15/2020 07/14/2020 07/10/2020  Glucose 70 - 99 mg/dL 97 97 -  BUN 8 - 23 mg/dL 46(H) 43(H) -  Creatinine 0.61 - 1.24 mg/dL 5.76(H) 5.59(H) -  Sodium 135 - 145 mmol/L 142 141 -  Potassium 3.5 - 5.1 mmol/L 4.0 3.6 2.8(L)  Chloride 98 - 111 mmol/L 99 98 -  CO2 22 - 32 mmol/L 30 31 -  Calcium 8.9 - 10.3 mg/dL 8.5(L) 8.9 -  Total Protein 6.5 - 8.1 g/dL - 5.8(L) -  Total Bilirubin 0.3 - 1.2 mg/dL - 0.4 -  Alkaline Phos 38 - 126 U/L - 66 -  AST 15 - 41 U/L - 26 -  ALT 0 - 44 U/L - 15 -    Recent Labs  Lab 07/14/20 1641  INR 1.5*   _________________________________________________________ Radiologic studies:  COVID-negative ______________________________________________________ Other studies:   _______________________________________________________ Assessment & Plan   Impression:  Melena Acute blood loss anemia Prosthetic valve endocarditis Recent pulmonary embolism on oral anticoagulation.  Last dose on 07/13/2020 (clearance possibly prolonged by renal disease) COPD  Suspect upper GI bleed, possibly peptic ulcer and patient on concomitant aspirin and [place].  He has never had a prior colonoscopy that he recalls.  Right-sided colonic source of bleeding is considered less likely. He is hemodynamically stable from his GI bleed.  Oral anticoagulation effect should be about washed out by now.  Mild consumptive coagulopathy due to GI bleeding.   Plan:  I recommended he undergo upper endoscopy.  Procedure was described in detail along with risks and benefits and he was agreeable.  The benefits and risks of the planned procedure were described in detail with the patient or (when appropriate) their health care proxy.  Risks were outlined as including, but not limited to, bleeding, infection, perforation, adverse medication reaction leading to cardiac or pulmonary decompensation, pancreatitis (if ERCP).  The limitation of incomplete mucosal visualization was also discussed.  No guarantees or warranties were given.  Patient at increased risk for cardiopulmonary complications of procedure due to medical comorbidities.  We are tentatively planning for mid afternoon after patient has had time to receive 2 units of PRBCs slowly and ensure cardiovascular and respiratory stability afterwards.  (Wife updated)  Thank you for the courtesy of this consult.  Please contact me with any questions or concerns.  Nelida Meuse III Office: [phone]

## 2020-07-15 NOTE — Interval H&P Note (Signed)
History and Physical Interval Note:  07/15/2020 12:10 PM  [name]  has presented today for surgery, with the diagnosis of Anemia.  The various methods of treatment have been discussed with the patient and family. After consideration of risks, benefits and other options for treatment, the patient has consented to  Procedure(s): ESOPHAGOGASTRODUODENOSCOPY (EGD) WITH PROPOFOL (N/A) as a surgical intervention.  The patient's history has been reviewed, patient examined, no change in status, stable for surgery.  I have reviewed the patient's chart and labs.  Questions were answered to the patient's satisfaction.    Patient rec'd 2 units PRBCs, VS stable, breathing comfortably and having no chest.  Nelida Meuse III

## 2020-07-15 NOTE — Progress Notes (Signed)
PROGRESS NOTE    [name]  RCV:[id number] DOB: Jan 06, 1946 DOA: 07/14/2020 PCP: Celene Squibb, MD   Chief Complaint  Patient presents with  . GI Bleeding   Brief Narrative: 75 year old male with multiple medical comorbidities, ESRD on PD, CAD, COPD, gout, hemorrhoids, hypertension, hypothyroidism, recently diagnosed infective endocarditis currently receiving antibiotics through January 24, recently diagnosed PE on Eliquis, aortic valve replacement with bioprosthetic/porcine tissue valve presents to the ED with black and tarry stool x3-week and also had a stool mixed with cherry red blood in evening 1/15, so came to the ED Work-up showed Hemoccult positive, hemoglobin downtrending 7.1 g compared to 8.82 weeks ago, orthostatic vitals negative.  GI was consulted patient was admitted  Subjective:  No new complaints,feels well. No BM since coming to ED Hemoglobin has dropped to 5.9 g  Assessment & Plan:  Acute blood loss anemia due to GI bleed FOBT positive black tarry stool x3-week, GI consulted, continue PPI, Eliquis on hold transfusing2  units PRBC this morning Recent Labs  Lab 07/14/20 1221 07/15/20 0610  HGB 7.1* 5.9*  HCT 24.3* 20.4*   Black tarry stool FOBT positive/GI bleeding: In the setting of anticoagulation multiple medical comorbidities, ESRD on PD, GI consulted, keep n.p.o. monitor H&H, continue Protonix drip.  ESRD on PD: Nephrology available discussed this morning plan for PD tonight.  Patient now with no hyperkalemia no fluid overload.  Continue po bicarb, stop kcl  Recently diagnosed infective endocarditis currently receiving antibiotics through January 24, cont  rocephin/unasyn.  Recently diagnosed PE on Eliquis-held due to GI bleeding and blood loss anemia.  D-dimer  1.4 better from last one 2.6 ,1 month ago.  Duplex of the leg this admission showed is indeterminate deep vein thrombosis involving the left femoral vein left popliteal vein and left gastrocnemius  vein, tech noticed there seeme to have broken clot likely causing Pe seen last time.  Await for EGD and will see if we can resume anticoagulation- if not IR eval or possible ivf filter?  Aortic valve replacement with bioprosthetic/porcine tissue valve.  Had TEE 06/12/2020 with EF 60 to 65%, with moderately elevated aortic valve gradient but unchanged from 2019.  Morbid Obesity obesity with BMI 36, will benefit weight loss and PCP follow-up.    CAD-no CP.  Aspirin on hold due to GI bleed, continue statin and metoprolol.  COPD-not in exacerbation.  Continue as needed inhalers.  Gout-stable  Hx if hemorrhoids  Hypertension:BP controlled  Hypothyroidism: Continue home Synthroid.  INutrition: Diet Order            Diet NPO time specified Except for: Sips with Meds  Diet effective now                 There is no height or weight on file to calculate BMI.  DVT prophylaxis: Foot Pump / plexipulse Start: 07/14/20 1641 Code Status:   Code Status: Full Code  Family Communication: plan of care discussed with patient at bedside.  Status is: Inpatient Remains inpatient appropriate because:IV treatments appropriate due to intensity of illness or inability to take PO and Inpatient level of care appropriate due to severity of illness  Dispo: The patient is from: Home              Anticipated d/c is to: Home              Anticipated d/c date is: 2 days              Patient  currently is not medically stable to d/c.  Consultants:see note  Procedures:see note  Culture/Microbiology    Component Value Date/Time   SDES BLOOD RIGHT HAND 06/10/2020 0517   SDES BLOOD RIGHT HAND 06/10/2020 0517   SPECREQUEST  06/10/2020 0517    BOTTLES DRAWN AEROBIC ONLY Blood Culture adequate volume   SPECREQUEST  06/10/2020 0517    BOTTLES DRAWN AEROBIC ONLY Blood Culture results may not be optimal due to an inadequate volume of blood received in culture bottles   CULT  06/10/2020 0517    NO GROWTH 5  DAYS Performed at [facility], [place] [address], [place], [place] [address]    CULT  06/10/2020 0517    NO GROWTH 5 DAYS Performed at [facility] [address] [place], [place] [address]    REPTSTATUS 06/15/2020 FINAL 06/10/2020 0517   REPTSTATUS 06/15/2020 FINAL 06/10/2020 0517    Other culture-see note  Medications: Scheduled Meds: . sodium chloride   Intravenous Once  . allopurinol  300 mg Oral Daily  . cholecalciferol  2,000 Units Oral See admin instructions  . docusate sodium  100 mg Oral Daily  . finasteride  5 mg Oral Daily  . fluticasone  1 spray Each Nare Daily  . ipratropium-albuterol  3 mL Nebulization Q6H  . latanoprost  1 drop Both Eyes QHS  . levothyroxine  175 mcg Oral QAC breakfast  . multivitamin with minerals  1 tablet Oral Daily  . potassium chloride SA  20 mEq Oral BID  . sertraline  100 mg Oral Daily  . simvastatin  40 mg Oral QPM  . sodium bicarbonate  1,300 mg Oral TID WC   Continuous Infusions: . ampicillin (OMNIPEN) IV Stopped (07/14/20 2221)  . cefTRIAXone (ROCEPHIN)  IV Stopped (07/14/20 2047)  . 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 First Dose: Yes Last Day of Therapy:  07/22/2020 Labs - Once weekly:  CBC/D and BMP, Labs - Every other week:  ESR and CRP Method of administration: Ambulatory Pump (Continuous Infusion) Method of administration may be changed at the discretion of   2 g Intravenous Every 12 hours 07/14/20 1711 07/14/20 1732   07/14/20 2000  ampicillin (OMNIPEN) 2 g in sodium chloride 0.9 % 100 mL IVPB        2 g 300 mL/hr over 20 Minutes Intravenous Every 12 hours 07/14/20 1732     07/14/20 2000  cefTRIAXone (ROCEPHIN) 2 g in sodium chloride 0.9 % 100 mL IVPB         2 g 200 mL/hr over 30 Minutes Intravenous Every 12 hours 07/14/20 1732     07/14/20 1715  cefTRIAXone (ROCEPHIN) IVPB  Status:  Discontinued       Note to Pharmacy: 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 First Dose: Yes Last Day of Therapy:  07/22/2020 Labs - Once weekly:  CBC/D and BMP, Labs - Every other week:  ESR and CRP Method of administration: IV Push Method of administration may be changed at the discretion of home infusion pharmacist base   2 g Intravenous Every 12 hours 07/14/20 1711 07/14/20 1732     Objective: Vitals: Today's Vitals   07/14/20 2222 07/15/20 0030 07/15/20 0230 07/15/20 0615  BP:  113/64 111/66  110/61  Pulse:  69 78 72  Resp:  '15 17 14  ' Temp:    98.2 F (36.8 C)  TempSrc:    Oral  SpO2:  98% 96% 97%  PainSc: 0-No pain       Intake/Output Summary (Last 24 hours) at 07/15/2020 4332 Last data filed at 07/14/2020 2221 Gross per 24 hour  Intake 300 ml  Output -  Net 300 ml   There were no vitals filed for this visit. Weight change:   Intake/Output from previous day: 01/16 0701 - 01/17 0700 In: 300 [IV Piggyback:300] Out: -  Intake/Output this shift: No intake/output data recorded. There were no vitals filed for this visit.  Examination: General exam: AAOx3, obese ,NAD, weak appearing. HEENT:Oral mucosa moist, Ear/Nose WNL grossly,dentition normal. Respiratory system: bilaterally clear,no wheezing or crackles,no use of accessory muscle, non tender. Cardiovascular system: S1 & S2 +, regular, No JVD. Gastrointestinal system: PD cath in place, Abdomen soft, NT,ND, BS+. Nervous System:Alert, awake, moving extremities and grossly nonfocal Extremities: No edema, distal peripheral pulses palpable.  Skin: No rashes,no icterus. MSK: Normal muscle bulk,tone, power   Data Reviewed: I have personally reviewed following labs and imaging studies CBC: Recent Labs  Lab 07/14/20 1221 07/15/20 0610  WBC 8.8 8.5  HGB 7.1* 5.9*  HCT 24.3*  20.4*  MCV 103.0* 103.0*  PLT 224 951   Basic Metabolic Panel: Recent Labs  Lab 07/10/20 1830 07/14/20 1221 07/15/20 0610  NA  --  141 142  K 2.8* 3.6 4.0  CL  --  98 99  CO2  --  31 30  GLUCOSE  --  97 97  BUN  --  43* 46*  CREATININE  --  5.59* 5.76*  CALCIUM  --  8.9 8.5*   GFR: Estimated Creatinine Clearance: 13.8 mL/min (A) (by C-G formula based on SCr of 5.76 mg/dL (H)). Liver Function Tests: Recent Labs  Lab 07/14/20 1221  AST 26  ALT 15  ALKPHOS 66  BILITOT 0.4  PROT 5.8*  ALBUMIN 2.1*   Recent Labs  Lab 07/14/20 1221  LIPASE 42   No results for input(s): AMMONIA in the last 168 hours. Coagulation Profile: Recent Labs  Lab 07/14/20 1641  INR 1.5*   Cardiac Enzymes: No results for input(s): CKTOTAL, CKMB, CKMBINDEX, TROPONINI in the last 168 hours. BNP (last 3 results) No results for input(s): PROBNP in the last 8760 hours. HbA1C: No results for input(s): HGBA1C in the last 72 hours. CBG: No results for input(s): GLUCAP in the last 168 hours. Lipid Profile: No results for input(s): CHOL, HDL, LDLCALC, TRIG, CHOLHDL, LDLDIRECT in the last 72 hours. Thyroid Function Tests: No results for input(s): TSH, T4TOTAL, FREET4, T3FREE, THYROIDAB in the last 72 hours. Anemia Panel: No results for input(s): VITAMINB12, FOLATE, FERRITIN, TIBC, IRON, RETICCTPCT in the last 72 hours. Sepsis Labs: No results for input(s): PROCALCITON, LATICACIDVEN in the last 168 hours.  Recent Results (from the past 240 hour(s))  SARS CORONAVIRUS 2 (TAT 6-24 HRS) Nasopharyngeal Nasopharyngeal Swab     Status: None   Collection Time: 07/14/20  3:37 PM   Specimen: Nasopharyngeal Swab  Result Value Ref Range Status   SARS Coronavirus 2 NEGATIVE NEGATIVE Final    Comment: (NOTE) SARS-CoV-2 target nucleic acids are NOT DETECTED.  The SARS-CoV-2 RNA is generally detectable in upper and lower respiratory specimens during the acute phase of infection. Negative results do not  preclude SARS-CoV-2 infection, do not rule out co-infections with other pathogens, and should  not be used as the sole basis for treatment or other patient management decisions. Negative results must be combined with clinical observations, patient history, and epidemiological information. The expected result is Negative.  Fact Sheet for Patients: SugarRoll.be  Fact Sheet for Healthcare Providers: https://www.woods-mathews.com/  This test is not yet approved or cleared by the [place] FDA and  has been authorized for detection and/or diagnosis of SARS-CoV-2 by FDA under an Emergency Use Authorization (EUA). This EUA will remain  in effect (meaning this test can be used) for the duration of the COVID-19 declaration under Se ction 564(b)(1) of the Act, 21 U.S.C. section 360bbb-3(b)(1), unless the authorization is terminated or revoked sooner.  Performed at [facility], [place] [address], [place],  [address]      Radiology Studies: No results found.   LOS: 1 day   Antonieta Pert, MD [facility]  07/15/2020, 7:28 AM

## 2020-07-15 NOTE — ED Notes (Signed)
Patient ambulated to the bathroom with steady gait.

## 2020-07-15 NOTE — Transfer of Care (Signed)
Immediate Anesthesia Transfer of Care Note  Patient: [name]  Procedure(s) Performed: ESOPHAGOGASTRODUODENOSCOPY (EGD) WITH PROPOFOL (N/A )  Patient Location: Endoscopy Unit  Anesthesia Type:MAC  Level of Consciousness: drowsy and patient cooperative  Airway & Oxygen Therapy: Patient Spontanous Breathing and Patient connected to nasal cannula oxygen  Post-op Assessment: Report given to RN and Post -op Vital signs reviewed and stable  Post vital signs: Reviewed and stable  Last Vitals:  Vitals Value Taken Time  BP    Temp    Pulse    Resp    SpO2      Last Pain:  Vitals:   07/15/20 1220  TempSrc: Axillary  PainSc:          Complications: No complications documented.

## 2020-07-15 NOTE — CV Procedure (Signed)
BLE venous duplex completed. Preliminary findings given to Anda Kraft, Therapist, sports.  Results can be found under chart review under CV PROC. 07/15/2020 10:41 AM Milee Qualls RVT, RDMS

## 2020-07-15 NOTE — Anesthesia Preprocedure Evaluation (Addendum)
Anesthesia Evaluation  Patient identified by MRN, date of birth, ID band Patient awake    Reviewed: Allergy & Precautions, H&P , NPO status , Patient's Chart, lab work & pertinent test results  History of Anesthesia Complications Negative for: history of anesthetic complications  Airway Mallampati: II  TM Distance: >3 FB Neck ROM: Full    Dental  (+) Edentulous Upper, Dental Advisory Given, Poor Dentition, Missing, Loose   Pulmonary COPD,  COPD inhaler, former smoker,    Pulmonary exam normal        Cardiovascular hypertension, Pt. on medications and Pt. on home beta blockers + CAD and +CHF  Normal cardiovascular exam     Neuro/Psych Depression negative neurological ROS     GI/Hepatic negative GI ROS, Neg liver ROS,   Endo/Other  Hypothyroidism Morbid obesity  Renal/GU Renal InsufficiencyRenal disease  negative genitourinary   Musculoskeletal  (+) Arthritis , Osteoarthritis,    Abdominal   Peds  Hematology negative hematology ROS (+)   Anesthesia Other Findings   Reproductive/Obstetrics negative OB ROS                            Anesthesia Physical  Anesthesia Plan  ASA: III  Anesthesia Plan: MAC   Post-op Pain Management:    Induction: Intravenous  PONV Risk Score and Plan: 1 and Propofol infusion  Airway Management Planned: Nasal Cannula  Additional Equipment:   Intra-op Plan:   Post-operative Plan:   Informed Consent: I have reviewed the patients History and Physical, chart, labs and discussed the procedure including the risks, benefits and alternatives for the proposed anesthesia with the patient or authorized representative who has indicated his/her understanding and acceptance.     Dental advisory given  Plan Discussed with: Anesthesiologist and CRNA  Anesthesia Plan Comments: (@ units PRBC transfused today.)       Anesthesia Quick Evaluation

## 2020-07-15 NOTE — ED Notes (Signed)
Patient only able to tolerate half of second portion of bowel prep.

## 2020-07-15 NOTE — Op Note (Signed)
[facility] Patient Name: [name] Procedure Date : 07/15/2020 MRN: [id number] Attending MD: Estill Cotta. Loletha Carrow , MD Date of Birth: 05-Mar-1946 CSN: [id number] Age: 75 Admit Type: Inpatient Procedure:                Upper GI endoscopy Indications:              Acute post hemorrhagic anemia, Melena Providers:                Mallie Mussel L. Loletha Carrow, MD, Erenest Rasher, RN, Lesia Sago, Technician Referring MD:             [facility] Medicines:                Monitored Anesthesia Care Complications:            No immediate complications. Estimated Blood Loss:     Estimated blood loss: none. Procedure:                Pre-Anesthesia Assessment:                           - Prior to the procedure, a History and Physical                            was performed, and patient medications and                            allergies were reviewed. The patient's tolerance of                            previous anesthesia was also reviewed. The risks                            and benefits of the procedure and the sedation                            options and risks were discussed with the patient.                            All questions were answered, and informed consent                            was obtained. Prior Anticoagulants: The patient has                            taken Eliquis (apixaban), last dose was 2 days                            prior to procedure. ASA Grade Assessment: IV - A                            patient with severe systemic disease that is a  constant threat to life. After reviewing the risks                            and benefits, the patient was deemed in                            satisfactory condition to undergo the procedure.                           After obtaining informed consent, the endoscope was                            passed under direct vision. Throughout the                             procedure, the patient's blood pressure, pulse, and                            oxygen saturations were monitored continuously. The                            GIF-H190 ([id number]) Olympus gastroscope was                            introduced through the mouth, and advanced to the                            third part of duodenum. The upper GI endoscopy was                            accomplished without difficulty. The patient                            tolerated the procedure well. Scope In: Scope Out: Findings:      The esophagus was normal.      The stomach was normal.      The cardia and gastric fundus were normal on retroflexion.      The examined duodenum was normal. Impression:               - Normal esophagus.                           - Normal stomach.                           - Normal examined duodenum.                           - No specimens collected. Recommendation:           - Return patient to hospital ward for ongoing care.                           - Clear liquid diet.                           -  Perform a colonoscopy tomorrow. Procedure Code(s):        --- Professional ---                           [phone], Esophagogastroduodenoscopy, flexible,                            transoral; diagnostic, including collection of                            specimen(s) by brushing or washing, when performed                            (separate procedure) Diagnosis Code(s):        --- Professional ---                           D62, Acute posthemorrhagic anemia                           K92.1, Melena (includes Hematochezia) CPT copyright 2019 American Medical Association. All rights reserved. The codes documented in this report are preliminary and upon coder review may  be revised to meet current compliance requirements. Jamarien Rodkey L. Loletha Carrow, MD 07/15/2020 12:34:26 PM This report has been signed electronically. Number of Addenda: 0

## 2020-07-16 ENCOUNTER — Inpatient Hospital Stay (HOSPITAL_COMMUNITY): Payer: Managed Care, Other (non HMO) | Admitting: Certified Registered Nurse Anesthetist

## 2020-07-16 ENCOUNTER — Encounter (HOSPITAL_COMMUNITY): Payer: Self-pay | Admitting: Internal Medicine

## 2020-07-16 ENCOUNTER — Encounter (HOSPITAL_COMMUNITY)
Admission: EM | Disposition: A | Discharge status: Home Health | Payer: Self-pay | Source: Home / Self Care | Attending: Internal Medicine

## 2020-07-16 DIAGNOSIS — K635 Polyp of colon: Secondary | ICD-10-CM

## 2020-07-16 DIAGNOSIS — K5521 Angiodysplasia of colon with hemorrhage: Principal | ICD-10-CM

## 2020-07-16 HISTORY — PX: COLONOSCOPY WITH PROPOFOL: SHX5780

## 2020-07-16 HISTORY — PX: HOT HEMOSTASIS: SHX5433

## 2020-07-16 LAB — BPAM RBC
Blood Product Expiration Date: 202202032359
Blood Product Expiration Date: 202202032359
ISSUE DATE / TIME: 202201170757
ISSUE DATE / TIME: 202201171030
Unit Type and Rh: 600
Unit Type and Rh: 600

## 2020-07-16 LAB — CBC
HCT: 25.5 % — ABNORMAL LOW (ref 39.0–52.0)
HCT: 28.3 % — ABNORMAL LOW (ref 39.0–52.0)
Hemoglobin: 7.6 g/dL — ABNORMAL LOW (ref 13.0–17.0)
Hemoglobin: 8.4 g/dL — ABNORMAL LOW (ref 13.0–17.0)
MCH: 29.6 pg (ref 26.0–34.0)
MCH: 29.6 pg (ref 26.0–34.0)
MCHC: 29.8 g/dL — ABNORMAL LOW (ref 30.0–36.0)
MCHC: 29.7 g/dL — ABNORMAL LOW (ref 30.0–36.0)
MCV: 99.2 fL (ref 80.0–100.0)
MCV: 99.6 fL (ref 80.0–100.0)
Platelets: 222 10*3/uL (ref 150–400)
Platelets: 247 10*3/uL (ref 150–400)
RBC: 2.57 MIL/uL — ABNORMAL LOW (ref 4.22–5.81)
RBC: 2.84 MIL/uL — ABNORMAL LOW (ref 4.22–5.81)
RDW: 18.9 % — ABNORMAL HIGH (ref 11.5–15.5)
RDW: 18.9 % — ABNORMAL HIGH (ref 11.5–15.5)
WBC: 8.7 10*3/uL (ref 4.0–10.5)
WBC: 8.8 10*3/uL (ref 4.0–10.5)
nRBC: 0.2 % (ref 0.0–0.2)
nRBC: 0 % (ref 0.0–0.2)

## 2020-07-16 LAB — TYPE AND SCREEN
ABO/RH(D): A NEG
Antibody Screen: NEGATIVE
Unit division: 0
Unit division: 0

## 2020-07-16 LAB — BASIC METABOLIC PANEL
Anion gap: 13 (ref 5–15)
BUN: 48 mg/dL — ABNORMAL HIGH (ref 8–23)
CO2: 28 mmol/L (ref 22–32)
Calcium: 8.7 mg/dL — ABNORMAL LOW (ref 8.9–10.3)
Chloride: 103 mmol/L (ref 98–111)
Creatinine, Ser: 5.9 mg/dL — ABNORMAL HIGH (ref 0.61–1.24)
GFR, Estimated: 9 mL/min — ABNORMAL LOW (ref >60)
Glucose, Bld: 98 mg/dL (ref 70–99)
Potassium: 3.8 mmol/L (ref 3.5–5.1)
Sodium: 144 mmol/L (ref 135–145)

## 2020-07-16 LAB — PHOSPHORUS: Phosphorus: 4.9 mg/dL — ABNORMAL HIGH (ref 2.5–4.6)

## 2020-07-16 LAB — POCT I-STAT, CHEM 8
BUN: 49 mg/dL — ABNORMAL HIGH (ref 8–23)
Calcium, Ion: 1.17 mmol/L (ref 1.15–1.40)
Chloride: 106 mmol/L (ref 98–111)
Creatinine, Ser: 5.8 mg/dL — ABNORMAL HIGH (ref 0.61–1.24)
Glucose, Bld: 90 mg/dL (ref 70–99)
HCT: 26 % — ABNORMAL LOW (ref 39.0–52.0)
Hemoglobin: 8.8 g/dL — ABNORMAL LOW (ref 13.0–17.0)
Potassium: 3.9 mmol/L (ref 3.5–5.1)
Sodium: 144 mmol/L (ref 135–145)
TCO2: 29 mmol/L (ref 22–32)

## 2020-07-16 LAB — APTT: aPTT: 38 seconds — ABNORMAL HIGH (ref 24–36)

## 2020-07-16 LAB — HEPARIN LEVEL (UNFRACTIONATED): Heparin Unfractionated: 0.1 IU/mL — ABNORMAL LOW (ref 0.30–0.70)

## 2020-07-16 SURGERY — COLONOSCOPY WITH PROPOFOL
Anesthesia: Monitor Anesthesia Care

## 2020-07-16 MED ORDER — PROPOFOL 500 MG/50ML IV EMUL
INTRAVENOUS | Status: DC | PRN
  Administered 2020-07-16: 20 ug/kg/min via INTRAVENOUS

## 2020-07-16 MED ORDER — PROPOFOL 10 MG/ML IV BOLUS
INTRAVENOUS | Status: DC | PRN
  Administered 2020-07-16 (×2): 30 mg via INTRAVENOUS

## 2020-07-16 MED ORDER — HEPARIN (PORCINE) 25000 UT/250ML-% IV SOLN
1550.0000 [IU]/h | INTRAVENOUS | Status: DC
  Administered 2020-07-16: 1550 [IU]/h via INTRAVENOUS
  Administered 2020-07-17: 1300 [IU]/h via INTRAVENOUS
  Administered 2020-07-17: 1400 [IU]/h via INTRAVENOUS
  Filled 2020-07-16 (×3): qty 250

## 2020-07-16 MED ORDER — IPRATROPIUM-ALBUTEROL 0.5-2.5 (3) MG/3ML IN SOLN
3.0000 mL | Freq: Two times a day (BID) | RESPIRATORY_TRACT | Status: DC
  Administered 2020-07-16 – 2020-07-17 (×3): 3 mL via RESPIRATORY_TRACT
  Filled 2020-07-16 (×3): qty 3

## 2020-07-16 MED ORDER — SODIUM CHLORIDE 0.9% FLUSH
10.0000 mL | INTRAVENOUS | Status: DC | PRN
  Administered 2020-07-18: 10 mL

## 2020-07-16 MED ORDER — DARBEPOETIN ALFA 25 MCG/0.42ML IJ SOSY
25.0000 ug | PREFILLED_SYRINGE | INTRAMUSCULAR | Status: DC
  Administered 2020-07-16: 25 ug via SUBCUTANEOUS
  Filled 2020-07-16: qty 0.42

## 2020-07-16 MED ORDER — CHLORHEXIDINE GLUCONATE CLOTH 2 % EX PADS
6.0000 | MEDICATED_PAD | Freq: Every day | CUTANEOUS | Status: DC
  Administered 2020-07-16 – 2020-07-18 (×3): 6 via TOPICAL

## 2020-07-16 MED ORDER — SODIUM CHLORIDE 0.9 % IV SOLN: INTRAVENOUS | Status: DC | PRN

## 2020-07-16 MED ORDER — HEPARIN (PORCINE) 25000 UT/250ML-% IV SOLN
1550.0000 [IU]/h | INTRAVENOUS | Status: DC
  Filled 2020-07-16: qty 250

## 2020-07-16 SURGICAL SUPPLY — 22 items

## 2020-07-16 NOTE — Anesthesia Postprocedure Evaluation (Signed)
Anesthesia Post Note  Patient: [name]  Procedure(s) Performed: COLONOSCOPY WITH PROPOFOL (N/A ) HOT HEMOSTASIS (ARGON PLASMA COAGULATION/BICAP) (N/A )     Patient location during evaluation: Endoscopy Anesthesia Type: MAC Level of consciousness: awake and alert, patient cooperative and oriented Pain management: pain level controlled Vital Signs Assessment: post-procedure vital signs reviewed and stable Respiratory status: nonlabored ventilation, spontaneous breathing, respiratory function stable and patient connected to nasal cannula oxygen Cardiovascular status: stable and blood pressure returned to baseline Postop Assessment: no apparent nausea or vomiting Anesthetic complications: no   No complications documented.  Last Vitals:  Vitals:   07/16/20 0929 07/16/20 0930  BP: (!) 110/48 (!) 110/48  Pulse: 77   Resp: 18   Temp:    SpO2: 97%     Last Pain:  Vitals:   07/16/20 0929  TempSrc:   PainSc: 0-No pain                 Rasheem Figiel,E. Herberta Pickron

## 2020-07-16 NOTE — Progress Notes (Signed)
Patient to start heparin at 1500 has SL CVL,  Discussed with bedside RN the need for PIV to be started for Heparin and infuse antibiotics via CVL so that labs can continue to be drawn via CVL.  Patient also needs dressing change but is eating lunch and prefers to wait until after.  Bedside RN to notify IV team if she is unable to obtain PIV for heparin infusion.

## 2020-07-16 NOTE — Progress Notes (Addendum)
PROGRESS NOTE    [name]  XBL:[id number] DOB: 1945-09-06 DOA: 07/14/2020 PCP: Celene Squibb, MD   Chief Complaint  Patient presents with  . 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  GI was consulted patient was admitted E/p EGD 1/17 and Colonoscopy 1/8  Subjective: Seen this morning in post endoscopic suite.Alert awake resting comfortably mild wet cough, has shortness of Breath on Laying Flat but Not in distress,Not Needing Oxygen.Reports He Did Not Get PD Last Night.  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 Loletha Carrow informed that we can challenge with heparin infusion without bolus-pharmacy consulted.  monitor H&H. Recent Labs  Lab 07/14/20 1221 07/15/20 0610 07/15/20 2033 07/16/20 0246 07/16/20 0807  HGB 7.1* 5.9* 8.4* 7.6* 8.8*  HCT 24.3* 20.4* 27.1* 25.5* 26.0*   Black tarry stool FOBT positive/GI bleeding: s/p EGD/Colo-see report. Cont ppi gtt  ESRD on PD: Nephrology has been consulted for peritoneal dialysis, has not received dialysis yet.  Will send message again today.  Potassium and bicarb looks stable appears to have mild fluid overload.    Recently diagnosed infective endocarditis patient was receiving antibiotics at home, stop date January 24 .Cont on   rocephin/unasyn.  Recently diagnosed PE on Eliquis,Duplex of the leg this admission showed age indeterminate DVT-left femoral vein left popliteal vein and left gastrocnemius vein, tech noticed there seems to have broken clot likely causing PE seen last time.  After EGD colonoscopy GI recommending to initiate heparin drip without bolus, pharmacy consulted.   Aortic valve replacement with bioprosthetic/porcine tissue valve.  Had TEE 06/12/2020 with EF 60 to 65%, with moderately elevated aortic valve gradient but unchanged from 2019.  Stable  Morbid Obesity obesity with BMI 36, will benefit with weight loss and PCP follow-up.    CAD/Chronic diastolic CHF-no chest pain, continue on statin metoprolol aspirin held due to GI bleed  COPD/chronic respiratory failure hypoxic on 2 L nasal cannula at home:respiratory status is stable, not in exacerbation.  Gout-stable  Hx if hemorrhoids-noted again and colonoscopy  Hypertension: Blood pressure is well controlled.    Hypothyroidism:cont home Synthroid.  INutrition: Diet Order            Diet renal with fluid restriction Fluid restriction: 1200 mL Fluid; Room service appropriate? Yes; Fluid consistency: Thin  Diet effective now                 Body mass index is 36.92 kg/m.  DVT prophylaxis: Foot Pump / plexipulse Start: 07/14/20 1641 Code Status:   Code Status: Full Code  Family Communication: plan of care discussed with patient at bedside.  Status is: Inpatient Remains inpatient appropriate because:IV treatments appropriate due to intensity of illness or inability to take PO and Inpatient level of care appropriate due to severity of illness, initiating IV heparin challenge for PE with plan to transition to home Eliquis once okay with GI  Dispo:  The patient is from: Home              Anticipated d/c is to: Home              Anticipated d/c date is1- 2 days              Patient currently is not medically stable to d/c.  Consultants:see  note  Procedures:see note  Culture/Microbiology    Component Value Date/Time   SDES BLOOD RIGHT HAND 06/10/2020 0517   SDES BLOOD RIGHT HAND 06/10/2020 0517   SPECREQUEST  06/10/2020 0517    BOTTLES DRAWN AEROBIC ONLY Blood Culture adequate volume   SPECREQUEST  06/10/2020 0517    BOTTLES DRAWN AEROBIC ONLY Blood Culture results may not be optimal due to an inadequate volume of blood received in culture bottles   CULT  06/10/2020 0517    NO GROWTH 5 DAYS Performed at [facility], [place] [address] [place], [place] [address]    CULT  06/10/2020 0517    NO GROWTH 5 DAYS Performed at [facility] [address], [place], [place] [address]    REPTSTATUS 06/15/2020 FINAL 06/10/2020 0517   REPTSTATUS 06/15/2020 FINAL 06/10/2020 0517    Other culture-see note  Medications: Scheduled Meds: . allopurinol  100 mg Oral Daily  . ALPRAZolam  0.125 mg Oral QHS  . Chlorhexidine Gluconate Cloth  6 each Topical Daily  . cholecalciferol  2,000 Units Oral See admin instructions  . docusate sodium  100 mg Oral Daily  . finasteride  5 mg Oral Daily  . fluticasone  1 spray Each Nare Daily  . gentamicin cream  1 application Topical Daily  . ipratropium-albuterol  3 mL Nebulization BID  . latanoprost  1 drop Both Eyes QHS  . levothyroxine  175 mcg Oral QAC breakfast  . metoprolol tartrate  25 mg Oral BID  . multivitamin with minerals  1 tablet Oral Daily  . sertraline  100 mg Oral Daily  . simvastatin  40 mg Oral QPM  . sodium bicarbonate  1,300 mg Oral TID WC   Continuous Infusions: . ampicillin (OMNIPEN) IV Stopped (07/15/20 2116)  . cefTRIAXone (ROCEPHIN)  IV Stopped (07/15/20 2154)  . dialysis solution 1.5% low-MG/low-CA    . dialysis solution 2.5% low-MG/low-CA    . heparin    . 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 First Dose: Yes Last Day of Therapy:  07/22/2020 Labs - Once weekly:  CBC/D and BMP, Labs - Every other week:  ESR and CRP Method of administration: Ambulatory Pump (Continuous Infusion) Method of administration may be changed at the discretion of   2 g Intravenous Every 12 hours 07/14/20 1711 07/14/20 1732   07/14/20 2000  ampicillin (OMNIPEN) 2 g in sodium chloride 0.9 % 100 mL IVPB        2 g 300 mL/hr over 20 Minutes Intravenous Every 12 hours 07/14/20 1732     07/14/20 2000  cefTRIAXone (ROCEPHIN) 2 g in sodium chloride 0.9 % 100 mL IVPB        2 g 200 mL/hr over 30 Minutes Intravenous Every 12 hours 07/14/20 1732     07/14/20 1715  cefTRIAXone (ROCEPHIN) IVPB  Status:  Discontinued       Note to Pharmacy: 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 First Dose: Yes Last Day of Therapy:  07/22/2020 Labs - Once weekly:  CBC/D and BMP, Labs - Every other week:  ESR and CRP Method of administration: IV Push Method of administration may be changed at the discretion of home infusion pharmacist base   2 g Intravenous Every 12 hours 07/14/20 1711 07/14/20 1732     Objective: Vitals: Today's Vitals   07/16/20 0910 07/16/20 0920 07/16/20 0929 07/16/20 0930  BP:  (!) 113/42 (!) 110/48 (!) 110/48  Pulse: 72 72 77   Resp: '15 14 18   ' Temp:      TempSrc:      SpO2: 99% 99% 97%   Weight:      Height:      PainSc:   0-No pain     Intake/Output Summary (Last 24 hours) at 07/16/2020 1128 Last data filed at 07/16/2020 1110 Gross per 24 hour  Intake 592.78 ml  Output 375 ml  Net 217.78 ml   Filed Weights   07/15/20 1141  Weight: 113.4 kg   Weight change:   Intake/Output from previous day: 01/17 0701 - 01/18 0700 In: 1142.8 [I.V.:292.8; Blood:650; IV [id number]:038] Out: -  Intake/Output this shift: Total I/O In: 100 [I.V.:100] Out: 375 [Urine:375] Filed Weights    07/15/20 1141  Weight: 113.4 kg    Examination: General exam: AAOx3,obese NAD, weak appearing. HEENT:Oral mucosa moist, Ear/Nose WNL grossly, dentition normal. Respiratory system: bilaterally diminished at base,no use of accessory muscle Cardiovascular system: S1 & S2 +, No JVD,. Gastrointestinal system: Abdomen soft, NT,ND, BS+ Nervous System:Alert, awake, moving extremities and grossly nonfocal Extremities: No edema, distal peripheral pulses palpable.  Skin: No rashes,no icterus. MSK: Normal muscle bulk,tone, power  Data Reviewed: I have personally reviewed following labs and imaging studies CBC: Recent Labs  Lab 07/14/20 1221 07/15/20 0610 07/15/20 2033 07/16/20 0246 07/16/20 0807  WBC 8.8 8.5  --  8.7  --   HGB 7.1* 5.9* 8.4* 7.6* 8.8*  HCT 24.3* 20.4* 27.1* 25.5* 26.0*  MCV 103.0* 103.0*  --  99.2  --   PLT 224 198  --  222  --    Basic Metabolic Panel: Recent Labs  Lab 07/10/20 1830 07/14/20 1221 07/15/20 0610 07/16/20 0246 07/16/20 0807  NA  --  141 142 144 144  K 2.8* 3.6 4.0 3.8 3.9  CL  --  98 99 103 106  CO2  --  '31 30 28  ' --   GLUCOSE  --  97 97 98 90  BUN  --  43* 46* 48* 49*  CREATININE  --  5.59* 5.76* 5.90* 5.80*  CALCIUM  --  8.9 8.5* 8.7*  --    GFR: Estimated Creatinine Clearance: 13.7 mL/min (A) (by C-G formula based on SCr of 5.8 mg/dL (H)). Liver Function Tests: Recent Labs  Lab 07/14/20 1221  AST 26  ALT 15  ALKPHOS 66  BILITOT 0.4  PROT 5.8*  ALBUMIN 2.1*   Recent Labs  Lab 07/14/20 1221  LIPASE 42   No results for input(s): AMMONIA in the last 168 hours. Coagulation Profile: Recent Labs  Lab 07/14/20 1641  INR 1.5*   Cardiac Enzymes: No results for input(s): CKTOTAL, CKMB, CKMBINDEX, TROPONINI in the last 168 hours. BNP (last 3 results) No results for input(s): PROBNP in the last 8760 hours. HbA1C: No results for input(s): HGBA1C in the last 72 hours. CBG: No results for input(s):  GLUCAP in the last 168  hours. Lipid Profile: No results for input(s): CHOL, HDL, LDLCALC, TRIG, CHOLHDL, LDLDIRECT in the last 72 hours. Thyroid Function Tests: No results for input(s): TSH, T4TOTAL, FREET4, T3FREE, THYROIDAB in the last 72 hours. Anemia Panel: No results for input(s): VITAMINB12, FOLATE, FERRITIN, TIBC, IRON, RETICCTPCT in the last 72 hours. Sepsis Labs: No results for input(s): PROCALCITON, LATICACIDVEN in the last 168 hours.  Recent Results (from the past 240 hour(s))  SARS CORONAVIRUS 2 (TAT 6-24 HRS) Nasopharyngeal Nasopharyngeal Swab     Status: None   Collection Time: 07/14/20  3:37 PM   Specimen: Nasopharyngeal Swab  Result Value Ref Range Status   SARS Coronavirus 2 NEGATIVE NEGATIVE Final    Comment: (NOTE) SARS-CoV-2 target nucleic acids are NOT DETECTED.  The SARS-CoV-2 RNA is generally detectable in upper and lower respiratory specimens during the acute phase of infection. Negative results do not preclude SARS-CoV-2 infection, do not rule out co-infections with other pathogens, and should not be used as the sole basis for treatment or other patient management decisions. Negative results must be combined with clinical observations, patient history, and epidemiological information. The expected result is Negative.  Fact Sheet for Patients: SugarRoll.be  Fact Sheet for Healthcare Providers: https://www.woods-mathews.com/  This test is not yet approved or cleared by the [place] FDA and  has been authorized for detection and/or diagnosis of SARS-CoV-2 by FDA under an Emergency Use Authorization (EUA). This EUA will remain  in effect (meaning this test can be used) for the duration of the COVID-19 declaration under Se ction 564(b)(1) of the Act, 21 U.S.C. section 360bbb-3(b)(1), unless the authorization is terminated or revoked sooner.  Performed at [facility], [place] [address], [place], [place] [address]       Radiology Studies: VAS [place] LOWER EXTREMITY VENOUS (DVT)  Result Date: 07/15/2020  Lower Venous DVT Study Indications: Pulmonary Embolism, and Edema.  Comparison Study: Previous LLE venous study 07/21/12 - negative. Performing Technologist: Rogelia Rohrer  Examination Guidelines: A complete evaluation includes B-mode imaging, spectral Doppler, color Doppler, and power Doppler as needed of all accessible portions of each vessel. Bilateral testing is considered an integral part of a complete examination. Limited examinations for reoccurring indications may be performed as noted. The reflux portion of the exam is performed with the patient in reverse Trendelenburg.  +---------+---------------+---------+-----------+----------+--------------+ RIGHT    CompressibilityPhasicitySpontaneityPropertiesThrombus Aging +---------+---------------+---------+-----------+----------+--------------+ CFV      Full           Yes      Yes                                 +---------+---------------+---------+-----------+----------+--------------+ SFJ      Full                                                        +---------+---------------+---------+-----------+----------+--------------+ FV Prox  Full           Yes      Yes                                 +---------+---------------+---------+-----------+----------+--------------+ FV Mid   Full  Yes      Yes                                 +---------+---------------+---------+-----------+----------+--------------+ FV DistalFull           Yes      Yes                                 +---------+---------------+---------+-----------+----------+--------------+ PFV      Full                                                        +---------+---------------+---------+-----------+----------+--------------+ POP      Full           Yes      Yes                                  +---------+---------------+---------+-----------+----------+--------------+ PTV      Full                                                        +---------+---------------+---------+-----------+----------+--------------+ PERO     Full                                                        +---------+---------------+---------+-----------+----------+--------------+   +---------+---------------+---------+-----------+--------------+---------------+ LEFT     CompressibilityPhasicitySpontaneityProperties    Thrombus Aging  +---------+---------------+---------+-----------+--------------+---------------+ CFV      Full           Yes      Yes                                      +---------+---------------+---------+-----------+--------------+---------------+ SFJ      Full                                                             +---------+---------------+---------+-----------+--------------+---------------+ FV Prox  None           No       No                       Age                                                                       Indeterminate   +---------+---------------+---------+-----------+--------------+---------------+  FV Mid   Partial        Yes      Yes                      Age                                                                       Indeterminate   +---------+---------------+---------+-----------+--------------+---------------+ FV DistalNone           Yes      Yes                      Age                                                                       Indeterminate   +---------+---------------+---------+-----------+--------------+---------------+ PFV      Full                                                             +---------+---------------+---------+-----------+--------------+---------------+ POP      Partial        Yes      Yes                      Age                                                                        Indeterminate   +---------+---------------+---------+-----------+--------------+---------------+ PTV      Full                                                             +---------+---------------+---------+-----------+--------------+---------------+ PERO     Full                                                             +---------+---------------+---------+-----------+--------------+---------------+ Gastroc  Partial        No       No         One of paired Age  gastrocnemius Indeterminate   +---------+---------------+---------+-----------+--------------+---------------+    Summary: RIGHT: - There is no evidence of deep vein thrombosis in the lower extremity. - There is no evidence of superficial venous thrombosis.  - No cystic structure found in the popliteal fossa.  LEFT: - Findings consistent with age indeterminate deep vein thrombosis involving the left femoral vein, left popliteal vein, and left gastrocnemius veins. - There is no evidence of superficial venous thrombosis.  - No cystic structure found in the popliteal fossa.  *See table(s) above for measurements and observations. Electronically signed by Ruta Hinds MD on 07/15/2020 at 5:18:31 PM.    Final      LOS: 2 days   Antonieta Pert, MD [facility]  07/16/2020, 11:28 AM

## 2020-07-16 NOTE — Anesthesia Procedure Notes (Signed)
Procedure Name: MAC Date/Time: 07/16/2020 8:20 AM Performed by: Lowella Dell, CRNA Pre-anesthesia Checklist: Patient identified, Emergency Drugs available, Suction available, Patient being monitored and Timeout performed Patient Re-evaluated:Patient Re-evaluated prior to induction Oxygen Delivery Method: Simple face mask Placement Confirmation: positive ETCO2 Dental Injury: Teeth and Oropharynx as per pre-operative assessment

## 2020-07-16 NOTE — Progress Notes (Addendum)
ANTICOAGULATION CONSULT NOTE - Initial Consult  Pharmacy Consult for Heparin Indication: pulmonary embolus  No Known Allergies  Patient Measurements: Height: 5\' 9"  (175.3 cm) Weight: 113.4 kg (250 lb) IBW/kg (Calculated) : 70.7 Heparin Dosing Weight: 95.8 kg  Vital Signs: Temp: 98 F (36.7 C) (01/18 0907) Temp Source: Oral (01/18 0907) BP: 110/48 (01/18 0930) Pulse Rate: 77 (01/18 0929)  Labs: Recent Labs    07/14/20 1221 07/14/20 1641 07/15/20 0610 07/15/20 2033 07/16/20 0246 07/16/20 0807  HGB 7.1*  --  5.9* 8.4* 7.6* 8.8*  HCT 24.3*  --  20.4* 27.1* 25.5* 26.0*  PLT 224  --  198  --  222  --   LABPROT  --  17.9*  --   --   --   --   INR  --  1.5*  --   --   --   --   CREATININE 5.59*  --  5.76*  --  5.90* 5.80*    Estimated Creatinine Clearance: 13.7 mL/min (A) (by C-G formula based on SCr of 5.8 mg/dL (H)).   Medical History: Past Medical History:  Diagnosis Date  . Aortic stenosis   . Arthritis   . Atherosclerotic heart disease of native coronary artery without angina pectoris   . CAD (coronary artery disease)   . Chronic obstructive pulmonary disease, unspecified ([place])   . COPD (chronic obstructive pulmonary disease) ([place])   . Depression   . Essential (primary) hypertension   . Gout, unspecified   . Heart murmur   . Hemorrhoid   . History of peritoneal dialysis   . Hyperlipidemia, unspecified   . Hypertension   . Hypokalemia 07/10/2020  . Hypothyroidism   . Hypothyroidism, unspecified   . Kidney stones   . Major depressive disorder, single episode, unspecified   . Morbid (severe) obesity due to excess calories ([place])   . Nonrheumatic aortic (valve) stenosis   . Orthostatic hypotension   . Syncope   . Thyroid disease     Medications:  Infusions:  . ampicillin (OMNIPEN) IV Stopped (07/15/20 2116)  . cefTRIAXone (ROCEPHIN)  IV Stopped (07/15/20 2154)  . dialysis solution 1.5% low-MG/low-CA    . dialysis solution 2.5% low-MG/low-CA    .  pantoprozole (PROTONIX) infusion 8 mg/hr (07/16/20 0334)    Assessment: 30 YOM on Apixaban PTA for recently diagnosed PE (06/10/20), also w/ history of tissue AVR who presented on 1/16 with black tarry stool. Apixaban was held for GIB evaluation and work-up. Now with plans to start Heparin for bridging without a bolus.   Per Dr. Loletha Carrow' note with GI - okay to start Heparin in 6-8 hours from colonoscopy this AM. Noted procedure end time of 0900.   Hgb 8.8 - received 2 units of PRBC yesterday. In December - the patient was therapeutic on Heparin at a rate of 1650 units/hr in the upper end of the goal range (0.5-0.7). Will start slightly lower to target lower end of goal range (0.3-0.5) while monitoring for bleeding.   Goal of Therapy:  Heparin level 0.3-0.5 units/ml Monitor platelets by anticoagulation protocol: Yes   Plan:  - Start Heparin at 1550 units/hr - starting at 1500 this afternoon (~6h after colonoscopy) - No bolus requested per MD - Will aim for low goal of 0.3-0.5 given GIB - Daily HL, CBC - Will continue to monitor for any signs/symptoms of bleeding and will follow up with heparin level in 8 hours after starting  Thank you for allowing pharmacy to be a part  of this patient's care.  Alycia Rossetti, PharmD, BCPS Clinical Pharmacist Clinical phone for 07/16/2020: [phone] 07/16/2020 10:35 AM   **Pharmacist phone directory can now be found on [email or web address] ([email or web address]).  Listed under [place].

## 2020-07-16 NOTE — Interval H&P Note (Signed)
History and Physical Interval Note:  07/16/2020 8:10 AM  [name]  has presented today for surgery, with the diagnosis of melena, anemia.  The various methods of treatment have been discussed with the patient and family. After consideration of risks, benefits and other options for treatment, the patient has consented to  Procedure(s): COLONOSCOPY WITH PROPOFOL (N/A) as a surgical intervention.  The patient's history has been reviewed, patient examined, no change in status, stable for surgery.  I have reviewed the patient's chart and labs.  Questions were answered to the patient's satisfaction.    Patient completed bowel prep and believes bleeding cleared toward end.  Hgb 7.6 at 02:45, repeat stat CBC ordered about 6:45 AM but not drawn.  iStat in endo = Hgb 8.8 VS stable - patient alert and comfortable.  Proceeding with colonoscopy [name]

## 2020-07-16 NOTE — Op Note (Signed)
[facility] Patient Name: [name] Procedure Date : 07/16/2020 MRN: [id number] Attending MD: Estill Cotta. Loletha Carrow , MD Date of Birth: 04-11-1946 CSN: [id number] Age: 75 Admit Type: Outpatient Procedure:                Colonoscopy Indications:              Melena, Acute post hemorrhagic anemia (no source on                            EGD yesterday) Providers:                Estill Cotta. Loletha Carrow, MD, Nelia Shi, RN, Kary Kos RN, RN, Ladona Ridgel, Technician, Cira Servant, CRNA Referring MD:             [facility] Medicines:                Monitored Anesthesia Care Complications:            No immediate complications. Estimated Blood Loss:     Estimated blood loss was minimal. Procedure:                Pre-Anesthesia Assessment:                           - Prior to the procedure, a History and Physical                            was performed, and patient medications and                            allergies were reviewed. The patient's tolerance of                            previous anesthesia was also reviewed. The risks                            and benefits of the procedure and the sedation                            options and risks were discussed with the patient.                            All questions were answered, and informed consent                            was obtained. Prior Anticoagulants: The patient has                            taken Eliquis (apixaban), last dose was 3 days  prior to procedure. ASA Grade Assessment: IV - A                            patient with severe systemic disease that is a                            constant threat to life. After reviewing the risks                            and benefits, the patient was deemed in                            satisfactory condition to undergo the procedure.                           After obtaining  informed consent, the colonoscope                            was passed under direct vision. Throughout the                            procedure, the patient's blood pressure, pulse, and                            oxygen saturations were monitored continuously. The                            CF-HQ190L ([id number]) Olympus colonoscope was                            introduced through the anus and advanced to the the                            terminal ileum, with identification of the                            appendiceal orifice and IC valve. The colonoscopy                            was somewhat difficult due to significant looping.                            The patient tolerated the procedure fairly well.                            The quality of the bowel preparation was good. The                            ileocecal valve, appendiceal orifice, and rectum                            were photographed. Scope In: 8:23:14 AM Scope Out: 8:53:51 AM Scope Withdrawal Time: 0 hours 26 minutes 3 seconds  Total  Procedure Duration: 0 hours 30 minutes 37 seconds  Findings:      The perianal and digital rectal examinations were normal.      The terminal ileum appeared normal (seen briefly - could not be       intubated due to scope looping). No fresh or old blood in TI.      A few small angioectasias with bleeding (slow, intermitent oozing) were       found in the proximal ascending colon (opposite wall ICV, 2 folds       distal). Coagulation for hemostasis using argon plasma at 0.5       liters/minute and 20 watts was successful (right colon setting).      There was a small area of denuded mucosa at the splenic flexure. No       vasular lesions or bleeding seen there.      Multiple sessile polyps (6-8) were found in the entire colon. The polyps       were small in size (all < 40m) - none were removed in the setting of       active GI bleeding and need to resume anticoagulation.      Internal  hemorrhoids were found. The hemorrhoids were medium-sized.      The exam was otherwise without abnormality on direct and retroflexion       views. Impression:               - The examined portion of the ileum was normal.                           - A few bleeding colonic angioectasias. Treated                            with argon plasma coagulation (APC).                           - Multiple small polyps in the entire colon.                           - Internal hemorrhoids.                           - The examination was otherwise normal on direct                            and retroflexion views.                           - No specimens collected. Moderate Sedation:      MAC sedation used Recommendation:           - Return patient to hospital ward for ongoing care.                           - Low sodium diet.                           - Begin unfractionated heparin without bolus in 6-8  hours.                           If bleeding recurs, stop heparin and patient would                            need IVC filter for left sided DVT.                           - No recommendation at this time regarding repeat                            colonoscopy. This will need to be revisited in the                            future, as the risks of a future routine                            colonoscopy for polypectomy might outweigh expected                            benefits in a patient with these medical conditions.                           - Patient needs peritoneal dialysis today.[facility] doc                            will be messaged and patient's wife will be called                            with results and plan) Procedure Code(s):        --- Professional ---                           [phone], Colonoscopy, flexible; with control of                            bleeding, any method Diagnosis Code(s):        --- Professional ---                           K55.21,  Angiodysplasia of colon with hemorrhage                           K63.5, Polyp of colon                           K64.8, Other hemorrhoids                           K92.1, Melena (includes Hematochezia)                           D62, Acute posthemorrhagic anemia CPT copyright 2019 [employer]. All rights reserved. The codes documented in this report are preliminary and upon coder  review may  be revised to meet current compliance requirements. Kvon Mcilhenny L. Loletha Carrow, MD 07/16/2020 9:16:18 AM This report has been signed electronically. Number of Addenda: 0

## 2020-07-16 NOTE — Transfer of Care (Signed)
Immediate Anesthesia Transfer of Care Note  Patient: [name]  Procedure(s) Performed: COLONOSCOPY WITH PROPOFOL (N/A ) HOT HEMOSTASIS (ARGON PLASMA COAGULATION/BICAP) (N/A )  Patient Location: PACU and Endoscopy Unit  Anesthesia Type:MAC  Level of Consciousness: drowsy and patient cooperative  Airway & Oxygen Therapy: Patient Spontanous Breathing and Patient connected to face mask oxygen  Post-op Assessment: Report given to RN and Post -op Vital signs reviewed and stable  Post vital signs: Reviewed and stable  Last Vitals:  Vitals Value Taken Time  BP 103/46 07/16/20 0907  Temp 36.7 C 07/16/20 0907  Pulse 76 07/16/20 0908  Resp 18 07/16/20 0908  SpO2 100 % 07/16/20 0908  Vitals shown include unvalidated device data.  Last Pain:  Vitals:   07/16/20 0907  TempSrc: Oral  PainSc: 0-No pain         Complications: No complications documented.

## 2020-07-16 NOTE — Anesthesia Preprocedure Evaluation (Signed)
Anesthesia Evaluation  Patient identified by MRN, date of birth, ID band Patient awake    Reviewed: Allergy & Precautions, NPO status , Patient's Chart, lab work & pertinent test results, reviewed documented beta blocker date and time   History of Anesthesia Complications Negative for: history of anesthetic complications  Airway Mallampati: I  TM Distance: >3 FB Neck ROM: Full    Dental  (+) Missing, Dental Advisory Given   Pulmonary COPD,  COPD inhaler and oxygen dependent, former smoker,  07/14/2020 SARS coronavirus NEG    + wheezing  rales    Cardiovascular hypertension, Pt. on medications and Pt. on home beta blockers (-) angina+ CAD and + CABG  + Valvular Problems/Murmurs (s/p AVR 2014)  Rhythm:Regular Rate:Normal  2021 ECHO: EF 60-65%,  Mitral: Multiple mitral regurgitant jets. MV degenerative with a mobile density on the anterior mitral valve leaflet that is poorly visualized but seems to have also been present on the TTE 07/07/2017.  Mild MR. Mild to moderate MS. Moderate to severe mitral annular calcification.  Tricuspid: TR moderate to severe. Aortic: Bioprosthetic aortic valve leaftlets are thin and moving well. AV gradients are moderately elevated, unchanged from 2019. There is a fixed, calcified density in the LVOT unchanged from 2019. The AV has been repaired/replaced, no AI, mod AS with  bioprosthetic valve present in the aortic position. Aortic valve mean gradient measures 28.0 mmHg. Aortic valve Vmax measures  3.52 m/s.    Neuro/Psych Depression negative neurological ROS     GI/Hepatic Neg liver ROS, GERD  Medicated and Controlled,  Endo/Other  Hypothyroidism Morbid obesity  Renal/GU ESRF and DialysisRenal disease (K+ 3.8, peritoneal dialysis (last 3d ago))     Musculoskeletal   Abdominal (+) + obese,   Peds  Hematology  (+) Blood dyscrasia (Hb 7.6), anemia , eliquis   Anesthesia Other Findings    Reproductive/Obstetrics                             Anesthesia Physical Anesthesia Plan  ASA: IV  Anesthesia Plan: MAC   Post-op Pain Management:    Induction:   PONV Risk Score and Plan: 1  Airway Management Planned: Natural Airway and Simple Face Mask  Additional Equipment: None  Intra-op Plan:   Post-operative Plan:   Informed Consent: I have reviewed the patients History and Physical, chart, labs and discussed the procedure including the risks, benefits and alternatives for the proposed anesthesia with the patient or authorized representative who has indicated his/her understanding and acceptance.     Dental advisory given  Plan Discussed with: CRNA and Surgeon  Anesthesia Plan Comments:         Anesthesia Quick Evaluation

## 2020-07-16 NOTE — Progress Notes (Signed)
[name] is a 75 y.o. male patient admitted. Awake, alert - oriented  X 4 - no acute distress noted.  VSS - Blood pressure (!) 104/94, pulse 82, temperature 98.5 F (36.9 C), temperature source Oral, resp. rate 20, height 5\' 9"  (1.753 m), weight 113.4 kg, SpO2 99 %. Central line in place, occlusive dsg clean, dry and intact.  Orientation to room, and floor completed.  Admission INP armband ID verified with patient/family, and in place.   SR up x 2, fall assessment complete, with patient and family able to verbalize understanding of risk associated with falls, and verbalized understanding to call nsg before up out of bed.  Call light within reach, patient able to voice, and demonstrate understanding. No evidence of skin break down noted on exam.  Pt educated and made aware of procedure later today. Consent signed and in pts chart.   Will cont to eval and treat per MD orders.  Minna Antis, RN 07/16/2020 4:16 AM

## 2020-07-16 NOTE — Progress Notes (Signed)
Notified of central line by phlebotomist. R chest site unremarkable. Date on dressing illegible. Pt reports dressing was last changed on 1/16. He asks that we confirm with his wife.  Routine line care orders entered so central line care will show in VAST queue.

## 2020-07-16 NOTE — Progress Notes (Signed)
[facility] Progress Note   Subjective:   Patient seen and examined at bedside.  Unable to complete PD overnight due to patient being in ED half the night.  Colonoscopy planned this AM.  Denies CP, SOB, orthopnea, n/v, abdominal pain, weakness and fatigue.  Reports BM became less bloody throughout the night.  Reports he has not used his inhalers in the last 24 hours.   Objective Vitals:   07/16/20 0910 07/16/20 0920 07/16/20 0929 07/16/20 0930  BP:  (!) 113/42 (!) 110/48 (!) 110/48  Pulse: 72 72 77   Resp: 15 14 18    Temp:      TempSrc:      SpO2: 99% 99% 97%   Weight:      Height:       Physical Exam General: Alert, obese male in NAD Heart:RRR, +1/6 systolic murmur Lungs: +diffuse wheezing b/l, nml WOB Abdomen:soft, NTND Extremities:2+ edema on R, 1+ on L Dialysis Access: LU AVF maturing, PD cath in RLQ c/d/i   Filed Weights   07/15/20 1141  Weight: 113.4 kg    Intake/Output Summary (Last 24 hours) at 07/16/2020 1144 Last data filed at 07/16/2020 1110 Gross per 24 hour  Intake 592.78 ml  Output 375 ml  Net 217.78 ml    Additional Objective Labs: Basic Metabolic Panel: Recent Labs  Lab 07/14/20 1221 07/15/20 0610 07/16/20 0246 07/16/20 0807  NA 141 142 144 144  K 3.6 4.0 3.8 3.9  CL 98 99 103 106  CO2 31 30 28   --   GLUCOSE 97 97 98 90  BUN 43* 46* 48* 49*  CREATININE 5.59* 5.76* 5.90* 5.80*  CALCIUM 8.9 8.5* 8.7*  --    Liver Function Tests: Recent Labs  Lab 07/14/20 1221  AST 26  ALT 15  ALKPHOS 66  BILITOT 0.4  PROT 5.8*  ALBUMIN 2.1*   Recent Labs  Lab 07/14/20 1221  LIPASE 42   CBC: Recent Labs  Lab 07/14/20 1221 07/15/20 0610 07/15/20 2033 07/16/20 0246 07/16/20 0807  WBC 8.8 8.5  --  8.7  --   HGB 7.1* 5.9* 8.4* 7.6* 8.8*  HCT 24.3* 20.4* 27.1* 25.5* 26.0*  MCV 103.0* 103.0*  --  99.2  --   PLT 224 198  --  222  --    Studies/Results: VAS [place] LOWER EXTREMITY VENOUS (DVT)  Result Date: 07/15/2020  Lower  Venous DVT Study Indications: Pulmonary Embolism, and Edema.  Comparison Study: Previous LLE venous study 07/21/12 - negative. Performing Technologist: Rogelia Rohrer  Examination Guidelines: A complete evaluation includes B-mode imaging, spectral Doppler, color Doppler, and power Doppler as needed of all accessible portions of each vessel. Bilateral testing is considered an integral part of a complete examination. Limited examinations for reoccurring indications may be performed as noted. The reflux portion of the exam is performed with the patient in reverse Trendelenburg.  +---------+---------------+---------+-----------+----------+--------------+ RIGHT    CompressibilityPhasicitySpontaneityPropertiesThrombus Aging +---------+---------------+---------+-----------+----------+--------------+ CFV      Full           Yes      Yes                                 +---------+---------------+---------+-----------+----------+--------------+ SFJ      Full                                                        +---------+---------------+---------+-----------+----------+--------------+  FV Prox  Full           Yes      Yes                                 +---------+---------------+---------+-----------+----------+--------------+ FV Mid   Full           Yes      Yes                                 +---------+---------------+---------+-----------+----------+--------------+ FV DistalFull           Yes      Yes                                 +---------+---------------+---------+-----------+----------+--------------+ PFV      Full                                                        +---------+---------------+---------+-----------+----------+--------------+ POP      Full           Yes      Yes                                 +---------+---------------+---------+-----------+----------+--------------+ PTV      Full                                                         +---------+---------------+---------+-----------+----------+--------------+ PERO     Full                                                        +---------+---------------+---------+-----------+----------+--------------+   +---------+---------------+---------+-----------+--------------+---------------+ LEFT     CompressibilityPhasicitySpontaneityProperties    Thrombus Aging  +---------+---------------+---------+-----------+--------------+---------------+ CFV      Full           Yes      Yes                                      +---------+---------------+---------+-----------+--------------+---------------+ SFJ      Full                                                             +---------+---------------+---------+-----------+--------------+---------------+ FV Prox  None           No       No                       Age  Indeterminate   +---------+---------------+---------+-----------+--------------+---------------+ FV Mid   Partial        Yes      Yes                      Age                                                                       Indeterminate   +---------+---------------+---------+-----------+--------------+---------------+ FV DistalNone           Yes      Yes                      Age                                                                       Indeterminate   +---------+---------------+---------+-----------+--------------+---------------+ PFV      Full                                                             +---------+---------------+---------+-----------+--------------+---------------+ POP      Partial        Yes      Yes                      Age                                                                       Indeterminate   +---------+---------------+---------+-----------+--------------+---------------+ PTV      Full                                                              +---------+---------------+---------+-----------+--------------+---------------+ PERO     Full                                                             +---------+---------------+---------+-----------+--------------+---------------+ Gastroc  Partial        No       No         One of paired Age  gastrocnemius Indeterminate   +---------+---------------+---------+-----------+--------------+---------------+    Summary: RIGHT: - There is no evidence of deep vein thrombosis in the lower extremity. - There is no evidence of superficial venous thrombosis.  - No cystic structure found in the popliteal fossa.  LEFT: - Findings consistent with age indeterminate deep vein thrombosis involving the left femoral vein, left popliteal vein, and left gastrocnemius veins. - There is no evidence of superficial venous thrombosis.  - No cystic structure found in the popliteal fossa.  *See table(s) above for measurements and observations. Electronically signed by Ruta Hinds MD on 07/15/2020 at 5:18:31 PM.    Final     Medications: . ampicillin (OMNIPEN) IV Stopped (07/15/20 2116)  . cefTRIAXone (ROCEPHIN)  IV Stopped (07/15/20 2154)  . dialysis solution 1.5% low-MG/low-CA    . dialysis solution 2.5% low-MG/low-CA    . heparin    . pantoprozole (PROTONIX) infusion 8 mg/hr (07/16/20 0334)   . allopurinol  100 mg Oral Daily  . ALPRAZolam  0.125 mg Oral QHS  . Chlorhexidine Gluconate Cloth  6 each Topical Daily  . cholecalciferol  2,000 Units Oral See admin instructions  . docusate sodium  100 mg Oral Daily  . finasteride  5 mg Oral Daily  . fluticasone  1 spray Each Nare Daily  . gentamicin cream  1 application Topical Daily  . ipratropium-albuterol  3 mL Nebulization BID  . latanoprost  1 drop Both Eyes QHS  . levothyroxine  175 mcg Oral QAC breakfast  . metoprolol tartrate  25  mg Oral BID  . multivitamin with minerals  1 tablet Oral Daily  . sertraline  100 mg Oral Daily  . simvastatin  40 mg Oral QPM  . sodium bicarbonate  1,300 mg Oral TID WC    Dialysis Orders: obtain CCPD orders   Assessment/Plan 1. GI bleed - normal EGD on 1/17.  colonoscopy with A few bleeding colonic angiectasias treated with APC, multiple sm polyps throughout length and internal hemorrhoids. Per GI. 2. ESRD - CCPD not completed overnight due to patient not having room until early AM. Plan for PD tonight.  Continue  Sodium bicarb and lasix for now.    3. Hypertension/volume  -BP low stable.  LE edema noted on exam. Use half 1.5% and half 2.5% due to help improve volume status. 4. Anemia of ESRD/GI bleed - Hgb 8.8.  See #1. Obtain iron studies. Aranesp ordered.  5. Metabolic bone disease - CCa in goal.  Will check phos.  on cholecalciferol q. monthly, no phosphorus binder listed on home meds follow 6. COPD  - continue meds per primary 7. History of recent PE on Eliquis - LE duplex showed age indeterminate DVT on L, per primary note there was a broken clot that likely caused recent PE.  Eliquis stopped d/t GIB. Heparin drip ordered.  8. Recent Enterococcus bacteremia/infective endocarditis - on ampicillin and ceftriaxone last dose January 24 9. 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

## 2020-07-17 ENCOUNTER — Encounter (HOSPITAL_COMMUNITY): Payer: Self-pay | Admitting: Gastroenterology

## 2020-07-17 LAB — CBC
HCT: 25.7 % — ABNORMAL LOW (ref 39.0–52.0)
Hemoglobin: 7.7 g/dL — ABNORMAL LOW (ref 13.0–17.0)
MCH: 29.7 pg (ref 26.0–34.0)
MCHC: 30 g/dL (ref 30.0–36.0)
MCV: 99.2 fL (ref 80.0–100.0)
Platelets: 228 10*3/uL (ref 150–400)
RBC: 2.59 MIL/uL — ABNORMAL LOW (ref 4.22–5.81)
RDW: 18.4 % — ABNORMAL HIGH (ref 11.5–15.5)
WBC: 9 10*3/uL (ref 4.0–10.5)
nRBC: 0 % (ref 0.0–0.2)

## 2020-07-17 LAB — BASIC METABOLIC PANEL
Anion gap: 11 (ref 5–15)
BUN: 43 mg/dL — ABNORMAL HIGH (ref 8–23)
CO2: 29 mmol/L (ref 22–32)
Calcium: 8.6 mg/dL — ABNORMAL LOW (ref 8.9–10.3)
Chloride: 103 mmol/L (ref 98–111)
Creatinine, Ser: 5.67 mg/dL — ABNORMAL HIGH (ref 0.61–1.24)
GFR, Estimated: 10 mL/min — ABNORMAL LOW (ref >60)
Glucose, Bld: 142 mg/dL — ABNORMAL HIGH (ref 70–99)
Potassium: 3.2 mmol/L — ABNORMAL LOW (ref 3.5–5.1)
Sodium: 143 mmol/L (ref 135–145)

## 2020-07-17 LAB — APTT
aPTT: 106 s — ABNORMAL HIGH (ref 24–36)
aPTT: 70 s — ABNORMAL HIGH (ref 24–36)

## 2020-07-17 LAB — HEPARIN LEVEL (UNFRACTIONATED)
Heparin Unfractionated: 0.25 IU/mL — ABNORMAL LOW (ref 0.30–0.70)
Heparin Unfractionated: 0.21 [IU]/mL — ABNORMAL LOW (ref 0.30–0.70)

## 2020-07-17 LAB — MAGNESIUM: Magnesium: 2.1 mg/dL (ref 1.7–2.4)

## 2020-07-17 LAB — HEMOGLOBIN AND HEMATOCRIT, BLOOD
HCT: 26.7 % — ABNORMAL LOW (ref 39.0–52.0)
Hemoglobin: 8 g/dL — ABNORMAL LOW (ref 13.0–17.0)

## 2020-07-17 MED ORDER — POTASSIUM CHLORIDE 20 MEQ PO PACK
40.0000 meq | PACK | Freq: Once | ORAL | Status: AC
  Administered 2020-07-17: 40 meq via ORAL
  Filled 2020-07-17: qty 2

## 2020-07-17 NOTE — Progress Notes (Signed)
ANTICOAGULATION CONSULT NOTE  Pharmacy Consult for Heparin Indication: pulmonary embolus  No Known Allergies  Patient Measurements: Height: 5\' 9"  (175.3 cm) Weight: 114 kg (251 lb 5.2 oz) IBW/kg (Calculated) : 70.7 Heparin Dosing Weight: 95.8 kg  Vital Signs: Temp: 98.7 F (37.1 C) (01/18 2017) Temp Source: Oral (01/18 2017) BP: 134/74 (01/18 2017) Pulse Rate: 79 (01/18 2017)  Labs: Recent Labs    07/14/20 1641 07/15/20 0610 07/16/20 0246 07/16/20 0807 07/16/20 1211 07/17/20 0010  HGB  --    < > 7.6* 8.8* 8.4* 7.7*  HCT  --    < > 25.5* 26.0* 28.3* 25.7*  PLT  --    < > 222  --  247 228  APTT  --   --   --   --  38* 106*  LABPROT 17.9*  --   --   --   --   --   INR 1.5*  --   --   --   --   --   HEPARINUNFRC  --   --   --   --  0.10*  --   CREATININE  --    < > 5.90* 5.80*  --  5.67*   < > = values in this interval not displayed.    Estimated Creatinine Clearance: 14 mL/min (A) (by C-G formula based on SCr of 5.67 mg/dL (H)).   Medical History: Past Medical History:  Diagnosis Date  . Aortic stenosis   . Arthritis   . Atherosclerotic heart disease of native coronary artery without angina pectoris   . CAD (coronary artery disease)   . Chronic obstructive pulmonary disease, unspecified ([place])   . COPD (chronic obstructive pulmonary disease) ([place])   . Depression   . Essential (primary) hypertension   . Gout, unspecified   . Heart murmur   . Hemorrhoid   . History of peritoneal dialysis   . Hyperlipidemia, unspecified   . Hypertension   . Hypokalemia 07/10/2020  . Hypothyroidism   . Hypothyroidism, unspecified   . Kidney stones   . Major depressive disorder, single episode, unspecified   . Morbid (severe) obesity due to excess calories ([place])   . Nonrheumatic aortic (valve) stenosis   . Orthostatic hypotension   . Syncope   . Thyroid disease     Medications:  Infusions:  . ampicillin (OMNIPEN) IV 2 g (07/16/20 2128)  . cefTRIAXone (ROCEPHIN)  IV 2 g  (07/16/20 2037)  . dialysis solution 1.5% low-MG/low-CA    . dialysis solution 2.5% low-MG/low-CA    . heparin 1,550 Units/hr (07/16/20 1618)    Assessment: 55 YOM on Apixaban PTA for recently diagnosed PE (06/10/20), also w/ history of tissue AVR who presented on 1/16 with black tarry stool. Apixaban was held for GIB evaluation and work-up. Now with plans to start Heparin for bridging without a bolus.   Per Dr. Loletha Carrow' note with GI - okay to start Heparin in 6-8 hours from colonoscopy this AM. Noted procedure end time of 0900.   Hgb 8.8 - received 2 units of PRBC yesterday. In December - the patient was therapeutic on Heparin at a rate of 1650 units/hr in the upper end of the goal range (0.5-0.7). Will start slightly lower to target lower end of goal range (0.3-0.5) while monitoring for bleeding.   1/19 AM update:  APTT is above goal  No issues per RN Hgb 7.7-watch closely   Goal of Therapy:  Heparin level 0.3-0.5 units/ml  APTT 66-84 secs Monitor  platelets by anticoagulation protocol: Yes   Plan:  - Dec heparin to 1300 units/hr - Re-check aPTT and heparin level at 1000 - No boluses requested per MD - Daily HL, CBC  Narda Bonds, PharmD, [facility] Pharmacist Phone: [phone]

## 2020-07-17 NOTE — Progress Notes (Signed)
[name] Progress Note   Subjective:   Patient seen and examined at bedside. Reports SOB last night when PD initially started but has now improved. No bloody BM overnight per patient.  Denies CP, edema, n/v/d, abdominal pain, weakness and fatigue.   Objective Vitals:   07/16/20 1448 07/16/20 1914 07/16/20 2017 07/17/20 0445  BP: 120/70 128/64 134/74 128/74  Pulse: 78 (P) 77 79 65  Resp: 17  18 18   Temp: 98.4 F (36.9 C) 98.2 F (36.8 C) 98.7 F (37.1 C) 98.2 F (36.8 C)  TempSrc: Oral Oral Oral Oral  SpO2: 94% 95% 96% 96%  Weight:  114 kg    Height:       Physical Exam General:chronically ill appearing male in NAD Heart:RRR Lungs:mostly CTAB Abdomen:soft, NTND Extremities:1+ LE edema b/l Dialysis Access: PD cath   [facility] Weights   07/15/20 1141 07/16/20 1914  Weight: 113.4 kg 114 kg    Intake/Output Summary (Last 24 hours) at 07/17/2020 1218 Last data filed at 07/17/2020 1100 Gross per 24 hour  Intake 1750.96 ml  Output 1100 ml  Net 650.96 ml    Additional Objective Labs: Basic Metabolic Panel: Recent Labs  Lab 07/15/20 0610 07/16/20 0246 07/16/20 0807 07/17/20 0010  NA 142 144 144 143  K 4.0 3.8 3.9 3.2*  CL 99 103 106 103  CO2 30 28  --  29  GLUCOSE 97 98 90 142*  BUN 46* 48* 49* 43*  CREATININE 5.76* 5.90* 5.80* 5.67*  CALCIUM 8.5* 8.7*  --  8.6*  PHOS  --  4.9*  --   --    Liver Function Tests: Recent Labs  Lab 07/14/20 1221  AST 26  ALT 15  ALKPHOS 66  BILITOT 0.4  PROT 5.8*  ALBUMIN 2.1*   Recent Labs  Lab 07/14/20 1221  LIPASE 42   CBC: Recent Labs  Lab 07/14/20 1221 07/15/20 0610 07/15/20 2033 07/16/20 0246 07/16/20 0807 07/16/20 1211 07/17/20 0010  WBC 8.8 8.5  --  8.7  --  8.8 9.0  HGB 7.1* 5.9*   < > 7.6* 8.8* 8.4* 7.7*  HCT 24.3* 20.4*   < > 25.5* 26.0* 28.3* 25.7*  MCV 103.0* 103.0*  --  99.2  --  99.6 99.2  PLT 224 198  --  222  --  247 228   < > = values in this interval not displayed.     Medications: . ampicillin (OMNIPEN) IV Stopped (07/16/20 2148)  . cefTRIAXone (ROCEPHIN)  IV 2 g (07/17/20 0959)  . dialysis solution 1.5% low-MG/low-CA    . dialysis solution 2.5% low-MG/low-CA    . heparin 1,300 Units/hr (07/17/20 0509)   . allopurinol  100 mg Oral Daily  . ALPRAZolam  0.125 mg Oral QHS  . Chlorhexidine Gluconate Cloth  6 each Topical Daily  . cholecalciferol  2,000 Units Oral See admin instructions  . darbepoetin (ARANESP) injection - NON-DIALYSIS  25 mcg Subcutaneous Q Tue-1800  . docusate sodium  100 mg Oral Daily  . finasteride  5 mg Oral Daily  . fluticasone  1 spray Each Nare Daily  . gentamicin cream  1 application Topical Daily  . ipratropium-albuterol  3 mL Nebulization BID  . latanoprost  1 drop Both Eyes QHS  . levothyroxine  175 mcg Oral QAC breakfast  . metoprolol tartrate  25 mg Oral BID  . multivitamin with minerals  1 tablet Oral Daily  . sertraline  100 mg Oral Daily  . simvastatin  40  mg Oral QPM  . sodium bicarbonate  1,300 mg Oral TID WC    Dialysis Orders: obtain CCPD orders  Assessment/Plan 1. GI bleed - normal EGD on 1/17.  colonoscopy with A few bleeding colonic angiectasias treated with APC, multiple sm polyps throughout length and internal hemorrhoids. Per GI. 2. ESRD - CCPD completed overnight.  Continue regular PD nightly.  Continue Sodium bicarb and lasix for now.    3. Hypertension/volume -BP in goal.  LE edema noted on exam. Use half 1.5% and half 2.5% due to help improve volume status. 4. Anemiaof ESRD/GI bleed - Hgb drop 7.7 today.  See #1. Obtain iron studies. Aranesp ordered.  5. Metabolic bone disease - CCa in goal.  Will check phos.  on cholecalciferol q. monthly, no phosphorus binderlisted on home meds follow 6. COPD  - continue meds per primary 7. History of recent PE on Eliquis - LE duplex showed age indeterminate DVT on L, per primary note there was a broken clot that likely caused recent PE.  Eliquis stopped  d/t GIB. Heparin drip ordered.  8. Recent Enterococcus bacteremia/infective endocarditis - on ampicillin and ceftriaxone last dose January 24 9. History of coronary disease status post CABG and [id number] 10. Hypokalemia - K 3.2 - given K supplement.   Jen Mow, PA-C [facility] 07/17/2020,12:18 PM  LOS: 3 days

## 2020-07-17 NOTE — Progress Notes (Signed)
ANTICOAGULATION CONSULT NOTE - Follow-Up Note  Pharmacy Consult for Heparin Indication: pulmonary embolus  No Known Allergies  Patient Measurements: Height: 5\' 9"  (175.3 cm) Weight: 114 kg (251 lb 5.2 oz) IBW/kg (Calculated) : 70.7 Heparin Dosing Weight: 95.8 kg  Vital Signs: Temp: 99.3 F (37.4 C) (01/19 2003) Temp Source: Oral (01/19 2003) BP: 118/78 (01/19 2003) Pulse Rate: 98 (01/19 2003)  Labs: Recent Labs    07/16/20 0246 07/16/20 0807 07/16/20 1211 07/17/20 0010 07/17/20 1000 07/17/20 1204 07/17/20 1530 07/17/20 2200  HGB 7.6* 8.8* 8.4* 7.7*  --   --  8.0*  --   HCT 25.5* 26.0* 28.3* 25.7*  --   --  26.7*  --   PLT 222  --  247 228  --   --   --   --   APTT  --   --  38* 106* 70*  --   --   --   HEPARINUNFRC  --   --  0.10*  --   --  0.25*  --  0.21*  CREATININE 5.90* 5.80*  --  5.67*  --   --   --   --     Estimated Creatinine Clearance: 14 mL/min (A) (by C-G formula based on SCr of 5.67 mg/dL (H)).   Assessment: 87 YOM on Apixaban PTA for recently diagnosed PE (06/10/20), also w/ history of tissue AVR who presented on 1/16 with black tarry stool. Apixaban was held for GIB evaluation and work-up. GI okayed start of Heparin bridge 6-8 from colonoscopy on 1/18. Pharmacy consulted to dose.   Heparin level remains subtherapeutic (0.21) on gtt at 1400 units/hr. No issues with line or bleeding reported per RN.  Goal of Therapy:  Heparin level 0.3-0.5 units/ml  Monitor platelets by anticoagulation protocol: Yes   Plan:  - Increase Heparin to 1550 units/hr  - No boluses requested per MD - F/u 8 hr heparin level  Thank you for allowing pharmacy to be a part of this patient's care.  Sherlon Handing, PharmD, BCPS Please see amion for complete clinical pharmacist phone list 07/17/2020 11:17 PM

## 2020-07-17 NOTE — Progress Notes (Addendum)
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  GI was consulted patient was admitted E/p EGD 1/17 and Colonoscopy 1/18-shows bleeding right colon AVM treated with APC during colonoscopy.  Assessment & Plan:   Acute blood loss anemia: -In the setting of acute lower GI bleed-FOBT positive and patient presented with black tarry stool x3 weeks. -Colonoscopy shows few bleeding colonic angiectasis status post APC.  EGD was negative. -Patient started on heparin without bolus-his H&H dropped from 8.8-7.7 this morning. -Recheck H&H this afternoon and repeat CBC tomorrow.  Continue heparin and continue regular diet as per GIs recommendations -Monitor H&H closely and transfuse if hemoglobin less than 7.  ESRD on peritoneal dialysis: -Management as per nephrology -Continue sodium bicarb, Aranesp   Infective endocarditis: Recently diagnosed.  Continue IV antibiotics Rocephin and Unasyn.  Stop date: 07/22/2020.  PE: Recently diagnosed. -Duplex of leg this admission showed age-indeterminate DVT-left femoral vein left popliteal vein and left gastrocnemius vein.  Tach noticed there seems to have broken clot likely causing PE. -Continue heparin GTT as per pharmacy and monitor H&H closely.  Aortic valve replacement with bioprosthetic/porcine tissue valve: -Had  a TEE on 06/12/2020 which showed ejection fraction of 60 to 65% with moderately elevated aortic valve gradient but unchanged from 2019.  Continue to monitor  Coronary artery disease/chronic diastolic CHF: Patient appears euvolemic on exam.  No ACS symptoms.  Continue home meds statin and metoprolol.  COPD/chronic respiratory hypoxemic respiratory failure: On 2 L at baseline. -No wheezing noted on exam.  Continue supplemental oxygen. -Continue albuterol as needed  Hypokalemia: Potassium 3.2.  Replenished.  Check magnesium level.  Repeat BMP tomorrow a.m.  History of hemorrhoids: Again noted on colonoscopy. -Avoid constipation.  Continue MiraLAX.  Monitor H&H.  Hypothyroidism: Continue Synthyroid  Gout: Continue allopurinol  Hypertension: Stable: Continue metoprolol  BPH: Continue Proscar  Hyperlipidemia: Continue statin  Depression/anxiety: Continue Zoloft, Xanax daily at bedtime  Morbid obesity with BMI of 37: Diet modification/exercise and weight loss recommended  DVT prophylaxis: Heparin Code Status: Full code Family Communication:  None present at bedside.  Plan of care discussed with patient in length and he verbalized understanding and agreed with it. Disposition Plan: home   Consultants:   GI  Nephrology  Procedures:   EGD  Colonoscopy  Antimicrobials:  Unasyn Rocephin Status is: Inpatient   Dispo: The patient is from: Home              Anticipated d/c is to: Home              Anticipated d/c date is: 2 days              Patient currently is not medically stable to d/c.     Subjective: Patient seen and examined.  Sitting comfortably on the bed.  Tells me that he feels better this morning.  Had small bowel movement after colonoscopy without any bleeding/melena.  No fever overnight.  No acute events overnight.  Denies epigastric pain, nausea or vomiting.  Wishes to go home today.  Objective: Vitals:   07/16/20 1448 07/16/20 1914 07/16/20 2017  07/17/20 0445  BP: 120/70 128/64 134/74 128/74  Pulse: 78 (P) 77 79 65  Resp: 17  18 18   Temp: 98.4 F (36.9 C) 98.2 F (36.8 C) 98.7 F (37.1 C) 98.2 F (36.8 C)  TempSrc: Oral Oral Oral Oral  SpO2: 94% 95% 96% 96%  Weight:  114 kg    Height:        Intake/Output Summary (Last 24 hours) at 07/17/2020 1148 Last data filed at 07/17/2020 0509 Gross per 24 hour  Intake 1510.96 ml  Output 700 ml  Net 810.96 ml   Filed Weights   07/15/20 1141 07/16/20 1914  Weight: 113.4 kg 114 kg    Examination:  General exam: Appears calm and comfortable, on 2 L of oxygen via nasal cannula, sitting comfortably.  Communicating well. Respiratory system: Clear to auscultation. Respiratory effort normal. Cardiovascular system: S1 & S2 heard, RRR. No JVD, murmurs, rubs, gallops or clicks. No pedal edema. Gastrointestinal system: Abdomen is nondistended, soft and nontender. No organomegaly or masses felt. Normal bowel sounds heard. Central nervous system: Alert and oriented. No focal neurological deficits. Extremities: Symmetric 5 x 5 power. Skin: No rashes, lesions or ulcers Psychiatry: Judgement and insight appear normal. Mood & affect appropriate.    Data Reviewed: I have personally reviewed following labs and imaging studies  CBC: Recent Labs  Lab 07/14/20 1221 07/15/20 0610 07/15/20 2033 07/16/20 0246 07/16/20 0807 07/16/20 1211 07/17/20 0010  WBC 8.8 8.5  --  8.7  --  8.8 9.0  HGB 7.1* 5.9* 8.4* 7.6* 8.8* 8.4* 7.7*  HCT 24.3* 20.4* 27.1* 25.5* 26.0* 28.3* 25.7*  MCV 103.0* 103.0*  --  99.2  --  99.6 99.2  PLT 224 198  --  222  --  247 300   Basic Metabolic Panel: Recent Labs  Lab 07/14/20 1221 07/15/20 0610 07/16/20 0246 07/16/20 0807 07/17/20 0010  NA 141 142 144 144 143  K 3.6 4.0 3.8 3.9 3.2*  CL 98 99 103 106 103  CO2 31 30 28   --  29  GLUCOSE 97 97 98 90 142*  BUN 43* 46* 48* 49* 43*  CREATININE 5.59* 5.76* 5.90* 5.80* 5.67*  CALCIUM 8.9 8.5* 8.7*  --  8.6*   PHOS  --   --  4.9*  --   --    GFR: Estimated Creatinine Clearance: 14 mL/min (A) (by C-G formula based on SCr of 5.67 mg/dL (H)). Liver Function Tests: Recent Labs  Lab 07/14/20 1221  AST 26  ALT 15  ALKPHOS 66  BILITOT 0.4  PROT 5.8*  ALBUMIN 2.1*   Recent Labs  Lab 07/14/20 1221  LIPASE 42   No results for input(s): AMMONIA in the last 168 hours. Coagulation Profile: Recent Labs  Lab 07/14/20 1641  INR 1.5*   Cardiac Enzymes: No results for input(s): CKTOTAL, CKMB, CKMBINDEX, TROPONINI in the last 168 hours. BNP (last 3 results) No results for input(s): PROBNP in the last 8760 hours. HbA1C: No results for input(s): HGBA1C in the last 72 hours. CBG: No results for input(s): GLUCAP in the last 168 hours. Lipid Profile: No results for input(s): CHOL, HDL, LDLCALC, TRIG, CHOLHDL, LDLDIRECT in the last 72 hours. Thyroid Function Tests: No results for input(s): TSH, T4TOTAL, FREET4, T3FREE, THYROIDAB in the last 72 hours. Anemia Panel: No results for input(s): VITAMINB12,  FOLATE, FERRITIN, TIBC, IRON, RETICCTPCT in the last 72 hours. Sepsis Labs: No results for input(s): PROCALCITON, LATICACIDVEN in the last 168 hours.  Recent Results (from the past 240 hour(s))  SARS CORONAVIRUS 2 (TAT 6-24 HRS) Nasopharyngeal Nasopharyngeal Swab     Status: None   Collection Time: 07/14/20  3:37 PM   Specimen: Nasopharyngeal Swab  Result Value Ref Range Status   SARS Coronavirus 2 NEGATIVE NEGATIVE Final    Comment: (NOTE) SARS-CoV-2 target nucleic acids are NOT DETECTED.  The SARS-CoV-2 RNA is generally detectable in upper and lower respiratory specimens during the acute phase of infection. Negative results do not preclude SARS-CoV-2 infection, do not rule out co-infections with other pathogens, and should not be used as the sole basis for treatment or other patient management decisions. Negative results must be combined with clinical observations, patient history, and  epidemiological information. The expected result is Negative.  Fact Sheet for Patients: SugarRoll.be  Fact Sheet for Healthcare Providers: https://www.woods-mathews.com/  This test is not yet approved or cleared by the [place] FDA and  has been authorized for detection and/or diagnosis of SARS-CoV-2 by FDA under an Emergency Use Authorization (EUA). This EUA will remain  in effect (meaning this test can be used) for the duration of the COVID-19 declaration under Se ction 564(b)(1) of the Act, 21 U.S.C. section 360bbb-3(b)(1), unless the authorization is terminated or revoked sooner.  Performed at [facility], [place] [address], [place], [place] [address]       Radiology Studies: No results found.  Scheduled Meds: . allopurinol  100 mg Oral Daily  . ALPRAZolam  0.125 mg Oral QHS  . Chlorhexidine Gluconate Cloth  6 each Topical Daily  . cholecalciferol  2,000 Units Oral See admin instructions  . darbepoetin (ARANESP) injection - NON-DIALYSIS  25 mcg Subcutaneous Q Tue-1800  . docusate sodium  100 mg Oral Daily  . finasteride  5 mg Oral Daily  . fluticasone  1 spray Each Nare Daily  . gentamicin cream  1 application Topical Daily  . ipratropium-albuterol  3 mL Nebulization BID  . latanoprost  1 drop Both Eyes QHS  . levothyroxine  175 mcg Oral QAC breakfast  . metoprolol tartrate  25 mg Oral BID  . multivitamin with minerals  1 tablet Oral Daily  . sertraline  100 mg Oral Daily  . simvastatin  40 mg Oral QPM  . sodium bicarbonate  1,300 mg Oral TID WC   Continuous Infusions: . ampicillin (OMNIPEN) IV Stopped (07/16/20 2148)  . cefTRIAXone (ROCEPHIN)  IV 2 g (07/17/20 0959)  . dialysis solution 1.5% low-MG/low-CA    . dialysis solution 2.5% low-MG/low-CA    . heparin 1,300 Units/hr (07/17/20 0509)     LOS: 3 days   Time spent: 35 minutes   Cornelia Walraven Loann Quill, MD [facility]  If 7PM-7AM, please contact  night-coverage [email or web address] 07/17/2020, 11:48 AM

## 2020-07-17 NOTE — Progress Notes (Signed)
ANTICOAGULATION CONSULT NOTE - Follow-Up Note  Pharmacy Consult for Heparin Indication: pulmonary embolus  No Known Allergies  Patient Measurements: Height: 5\' 9"  (175.3 cm) Weight: 114 kg (251 lb 5.2 oz) IBW/kg (Calculated) : 70.7 Heparin Dosing Weight: 95.8 kg  Vital Signs: Temp: 98.2 F (36.8 C) (01/19 0445) Temp Source: Oral (01/19 0445) BP: 128/74 (01/19 0445) Pulse Rate: 65 (01/19 0445)  Labs: Recent Labs    07/14/20 1641 07/15/20 0610 07/16/20 0246 07/16/20 0807 07/16/20 1211 07/17/20 0010  HGB  --    < > 7.6* 8.8* 8.4* 7.7*  HCT  --    < > 25.5* 26.0* 28.3* 25.7*  PLT  --    < > 222  --  247 228  APTT  --   --   --   --  38* 106*  LABPROT 17.9*  --   --   --   --   --   INR 1.5*  --   --   --   --   --   HEPARINUNFRC  --   --   --   --  0.10*  --   CREATININE  --    < > 5.90* 5.80*  --  5.67*   < > = values in this interval not displayed.    Estimated Creatinine Clearance: 14 mL/min (A) (by C-G formula based on SCr of 5.67 mg/dL (H)).   Medications:  Infusions:  . ampicillin (OMNIPEN) IV Stopped (07/16/20 2148)  . cefTRIAXone (ROCEPHIN)  IV Stopped (07/16/20 2107)  . dialysis solution 1.5% low-MG/low-CA    . dialysis solution 2.5% low-MG/low-CA    . heparin 1,300 Units/hr (07/17/20 0509)    Assessment: 89 YOM on Apixaban PTA for recently diagnosed PE (06/10/20), also w/ history of tissue AVR who presented on 1/16 with black tarry stool. Apixaban was held for GIB evaluation and work-up. GI okayed start of Heparin bridge 6-8 from colonoscopy on 1/18. Pharmacy consulted to dose.   Heparin level this afternoon is slightly SUBtherapeutic (HL 0.25, goal of 0.3-0.5). aPTT appears to be at goal but was elevated prior to heparin. Due to this - will only use heparin levels for monitoring moving forward.   Goal of Therapy:  Heparin level 0.3-0.5 units/ml  Monitor platelets by anticoagulation protocol: Yes   Plan:  - Increase Heparin to 1400 units/hr (14  ml/hr) - No boluses requested per MD - Daily HL, CBC - Will continue to monitor for any signs/symptoms of bleeding and will follow up with heparin level in 8 hours   Thank you for allowing pharmacy to be a part of this patient's care.  Alycia Rossetti, PharmD, BCPS Clinical Pharmacist Clinical phone for 07/17/2020: [phone] 07/17/2020 8:55 AM   **Pharmacist phone directory can now be found on [email or web address] ([email or web address]).  Listed under [place].

## 2020-07-17 NOTE — Progress Notes (Signed)
[facility] Progress Note  Chief Complaint: Hematochezia  History:  Mr. Po is improved today, with less dyspnea after getting his dialysis last night.  He also had a respiratory treatment just a little while ago. He denies abdominal pain and says he has only had a very small BM since yesterday's colonoscopy.  He has not seen any black or bloody stool since before the procedure.  ROS: Cardiovascular: Denies chest pain Respiratory: Chronic dyspnea  Objective:   Current Facility-Administered Medications:  .  acetaminophen (TYLENOL) tablet 650 mg, 650 mg, Oral, Q6H PRN **OR** acetaminophen (TYLENOL) suppository 650 mg, 650 mg, Rectal, Q6H PRN, Danis, Brandin Dilday L III, MD .  albuterol (VENTOLIN HFA) 108 (90 Base) MCG/ACT inhaler 2 puff, 2 puff, Inhalation, Q6H PRN, Nelida Meuse III, MD .  allopurinol (ZYLOPRIM) tablet 100 mg, 100 mg, Oral, Daily, Nelida Meuse III, MD, 100 mg at 07/17/20 0958 .  ALPRAZolam (XANAX) tablet 0.125 mg, 0.125 mg, Oral, QHS, Danis, Kadejah Sandiford L III, MD .  ampicillin (OMNIPEN) 2 g in sodium chloride 0.9 % 100 mL IVPB, 2 g, Intravenous, Q12H, Danis, Kirke Corin, MD, Stopped at 07/16/20 2148 .  cefTRIAXone (ROCEPHIN) 2 g in sodium chloride 0.9 % 100 mL IVPB, 2 g, Intravenous, Q12H, Danis, Estill Cotta III, MD, Last Rate: 200 mL/hr at 07/17/20 0959, 2 g at 07/17/20 0959 .  Chlorhexidine Gluconate Cloth 2 % PADS 6 each, 6 each, Topical, Daily, Danis, Estill Cotta III, MD, 6 each at 07/17/20 1004 .  cholecalciferol (VITAMIN D3) tablet 2,000 Units, 2,000 Units, Oral, See admin instructions, Danis, Estill Cotta III, MD .  Darbepoetin Alfa (ARANESP) injection 25 mcg, 25 mcg, Subcutaneous, Q Tue-1800, Penninger, [place], [place], 25 mcg at 07/16/20 1816 .  dialysis solution 1.5% low-MG/low-CA dianeal solution, , Intraperitoneal, Q24H, Danis, Estill Cotta III, MD .  dialysis solution 2.5% low-MG/low-CA dianeal solution, , Intraperitoneal, Q24H, Danis, Estill Cotta III, MD, 5,000 mL at 07/16/20 1937 .  docusate  sodium (COLACE) capsule 100 mg, 100 mg, Oral, Daily, Danis, Estill Cotta III, MD, 100 mg at 07/17/20 0958 .  finasteride (PROSCAR) tablet 5 mg, 5 mg, Oral, Daily, Danis, Estill Cotta III, MD, 5 mg at 07/17/20 0958 .  fluticasone (FLONASE) 50 MCG/ACT nasal spray 1 spray, 1 spray, Each Nare, Daily, Danis, Estill Cotta III, MD, 1 spray at 07/16/20 1056 .  gentamicin cream (GARAMYCIN) 0.1 % 1 application, 1 application, Topical, Daily, Danis, Estill Cotta III, MD, 1 application at 49/67/59 1005 .  heparin 2,500 Units in dialysis solution 2.5% low-MG/low-CA 5,000 mL dialysis solution, , Peritoneal Dialysis, PRN, Danis, Estill Cotta III, MD .  heparin ADULT infusion 100 units/mL (25000 units/277mL), 1,300 Units/hr, Intravenous, Continuous, Erenest Blank, RPH, Last Rate: 13 mL/hr at 07/17/20 0509, 1,300 Units/hr at 07/17/20 0509 .  ipratropium-albuterol (DUONEB) 0.5-2.5 (3) MG/3ML nebulizer solution 3 mL, 3 mL, Nebulization, BID, Danis, Estill Cotta III, MD, 3 mL at 07/17/20 0958 .  latanoprost (XALATAN) 0.005 % ophthalmic solution 1 drop, 1 drop, Both Eyes, QHS, Danis, Estill Cotta III, MD, 1 drop at 07/16/20 2032 .  levothyroxine (SYNTHROID) tablet 175 mcg, 175 mcg, Oral, QAC breakfast, Nelida Meuse III, MD, 175 mcg at 07/17/20 0502 .  metoprolol tartrate (LOPRESSOR) tablet 25 mg, 25 mg, Oral, BID, Nelida Meuse III, MD, 25 mg at 07/17/20 0959 .  multivitamin with minerals tablet 1 tablet, 1 tablet, Oral, Daily, Nelida Meuse III, MD, 1 tablet at 07/17/20 0958 .  ondansetron (ZOFRAN) tablet  4 mg, 4 mg, Oral, Q6H PRN **OR** ondansetron (ZOFRAN) injection 4 mg, 4 mg, Intravenous, Q6H PRN, Danis, Zuhair Lariccia L III, MD .  polyethylene glycol (MIRALAX / GLYCOLAX) packet 17 g, 17 g, Oral, Daily PRN, Nelida Meuse III, MD .  sertraline (ZOLOFT) tablet 100 mg, 100 mg, Oral, Daily, Danis, Estill Cotta III, MD, 100 mg at 07/17/20 0958 .  simvastatin (ZOCOR) tablet 40 mg, 40 mg, Oral, QPM, Nelida Meuse III, MD, 40 mg at 07/16/20 1711 .  sodium  bicarbonate tablet 1,300 mg, 1,300 mg, Oral, TID WC, Nelida Meuse III, MD, 1,300 mg at 07/17/20 0959 .  sodium chloride flush (NS) 0.9 % injection 10-40 mL, 10-40 mL, Intracatheter, PRN, Nelida Meuse III, MD  . ampicillin (OMNIPEN) IV Stopped (07/16/20 2148)  . cefTRIAXone (ROCEPHIN)  IV 2 g (07/17/20 0959)  . dialysis solution 1.5% low-MG/low-CA    . dialysis solution 2.5% low-MG/low-CA    . heparin 1,300 Units/hr (07/17/20 0509)     Vital signs in last 24 hrs: Vitals:   07/16/20 2017 07/17/20 0445  BP: 134/74 128/74  Pulse: 79 65  Resp: 18 18  Temp: 98.7 F (37.1 C) 98.2 F (36.8 C)  SpO2: 96% 96%    Intake/Output Summary (Last 24 hours) at 07/17/2020 1112 Last data filed at 07/17/2020 0509 Gross per 24 hour  Intake 1510.96 ml  Output 700 ml  Net 810.96 ml     Physical Exam Chronically ill-appearing man.  Sitting on edge of bed, pleasant and conversational, no distress  HEENT: sclera anicteric, oral mucosa without lesions  Neck: supple, no thyromegaly, JVD or lymphadenopathy  Cardiac: RRR without murmurs, S1S2 heard, + peripheral edema.  Right upper chest wall central line  Pulm: clear to auscultation bilaterally, normal RR and effort noted  Abdomen: soft, obese, no tenderness, with active bowel sounds. No guarding or palpable hepatosplenomegaly, limited by body habitus.  PD catheter  Skin; warm and dry, no jaundice  Recent Labs:  CBC Latest Ref Rng & Units 07/17/2020 07/16/2020 07/16/2020  WBC 4.0 - 10.5 K/uL 9.0 8.8 -  Hemoglobin 13.0 - 17.0 g/dL 7.7(L) 8.4(L) 8.8(L)  Hematocrit 39.0 - 52.0 % 25.7(L) 28.3(L) 26.0(L)  Platelets 150 - 400 K/uL 228 247 -    Recent Labs  Lab 07/14/20 1641  INR 1.5*   CMP Latest Ref Rng & Units 07/17/2020 07/16/2020 07/16/2020  Glucose 70 - 99 mg/dL 142(H) 90 98  BUN 8 - 23 mg/dL 43(H) 49(H) 48(H)  Creatinine 0.61 - 1.24 mg/dL 5.67(H) 5.80(H) 5.90(H)  Sodium 135 - 145 mmol/L 143 144 144  Potassium 3.5 - 5.1 mmol/L 3.2(L)  3.9 3.8  Chloride 98 - 111 mmol/L 103 106 103  CO2 22 - 32 mmol/L 29 - 28  Calcium 8.9 - 10.3 mg/dL 8.6(L) - 8.7(L)  Total Protein 6.5 - 8.1 g/dL - - -  Total Bilirubin 0.3 - 1.2 mg/dL - - -  Alkaline Phos 38 - 126 U/L - - -  AST 15 - 41 U/L - - -  ALT 0 - 44 U/L - - -     Radiologic studies:   Assessment & Plan  Assessment:  Hematochezia Acute blood loss anemia on top of anemia chronic kidney disease Pulmonary embolism and DVT, need for long-term anticoagulation. Bleeding right colon AVM treated with APC during colonoscopy yesterday.  He is not having overt rebleeding since the procedure  Decrease in hemoglobin today, though suspect this is related to fluctuating volume status.  He inquired about going home, but he is certainly not ready for discharge at least until tomorrow.  He needs further observation on IV heparin and close monitoring of hemoglobin and hematocrit to ensure there is no recurrent bleeding prior to resuming his oral anticoagulation and discharged home.   Plan: Continue regular diet Continue IV heparin today Check hemoglobin and hematocrit later this afternoon and CBC again tomorrow morning Please keep him on regular PD schedule to prevent volume overload.   Nelida Meuse III Office: [phone]

## 2020-07-18 ENCOUNTER — Inpatient Hospital Stay (HOSPITAL_COMMUNITY): Payer: Managed Care, Other (non HMO)

## 2020-07-18 LAB — HEPARIN LEVEL (UNFRACTIONATED): Heparin Unfractionated: 0.37 IU/mL (ref 0.30–0.70)

## 2020-07-18 LAB — BASIC METABOLIC PANEL
Anion gap: 12 (ref 5–15)
BUN: 31 mg/dL — ABNORMAL HIGH (ref 8–23)
CO2: 28 mmol/L (ref 22–32)
Calcium: 8.7 mg/dL — ABNORMAL LOW (ref 8.9–10.3)
Chloride: 103 mmol/L (ref 98–111)
Creatinine, Ser: 4.9 mg/dL — ABNORMAL HIGH (ref 0.61–1.24)
GFR, Estimated: 12 mL/min — ABNORMAL LOW (ref >60)
Glucose, Bld: 120 mg/dL — ABNORMAL HIGH (ref 70–99)
Potassium: 3.2 mmol/L — ABNORMAL LOW (ref 3.5–5.1)
Sodium: 143 mmol/L (ref 135–145)

## 2020-07-18 LAB — CBC
HCT: 27.6 % — ABNORMAL LOW (ref 39.0–52.0)
Hemoglobin: 8.1 g/dL — ABNORMAL LOW (ref 13.0–17.0)
MCH: 29.3 pg (ref 26.0–34.0)
MCHC: 29.3 g/dL — ABNORMAL LOW (ref 30.0–36.0)
MCV: 100 fL (ref 80.0–100.0)
Platelets: 248 10*3/uL (ref 150–400)
RBC: 2.76 MIL/uL — ABNORMAL LOW (ref 4.22–5.81)
RDW: 18.1 % — ABNORMAL HIGH (ref 11.5–15.5)
WBC: 9.7 10*3/uL (ref 4.0–10.5)
nRBC: 0 % (ref 0.0–0.2)

## 2020-07-18 MED ORDER — PANTOPRAZOLE SODIUM 40 MG PO TBEC: 40.0000 mg | DELAYED_RELEASE_TABLET | Freq: Every day | ORAL | Status: DC

## 2020-07-18 MED ORDER — POTASSIUM CHLORIDE 20 MEQ PO PACK
40.0000 meq | PACK | Freq: Once | ORAL | Status: AC
  Administered 2020-07-18: 40 meq via ORAL
  Filled 2020-07-18: qty 2

## 2020-07-18 NOTE — Progress Notes (Signed)
Daily Rounding Note  07/18/2020, 9:46 AM  LOS: 4 days   SUBJECTIVE:   Chief complaint: Melena/hematochezia Bowel movement yesterday morning was yellow-colored, this morning it was more brown.  Stools are formed.  Moving about room his dyspnea is at baseline.  No dizziness.  Enjoying, tolerating solid food.  Has pretty good  OBJECTIVE:         Vital signs in last 24 hours:    Temp:  [97.5 F (36.4 C)-99.5 F (37.5 C)] 97.5 F (36.4 C) (01/20 0642) Pulse Rate:  [70-98] 70 (01/20 0642) Resp:  [18-20] 18 (01/20 0642) BP: (118-141)/(70-78) 141/77 (01/20 0642) SpO2:  [89 %-94 %] 92 % (01/20 0642) Weight:  [112.9 kg-114 kg] 114 kg (01/19 1918) Last BM Date: 07/17/20 Filed Weights   07/16/20 1914 07/17/20 1230 07/17/20 1918  Weight: 114 kg 112.9 kg 114 kg   General: Obese, looks chronically ill.  Comfortable, no acute distress Heart: RRR, rate in 70s on telemetry monitor Chest: Diminished breath sounds but clear.  No labored breathing, no cough Abdomen: Obese, nontender, active bowel sounds.  PD cath site benign. Extremities: Bilateral lower extremity edema. Neuro/Psych: Pleasant, calm, follows commands.  Moves all 4 limbs, no tremors.  Intake/Output from previous day: 01/19 0701 - 01/20 0700 In: 1170.2 [P.O.:720; I.V.:250.2; IV Piggyback:200] Out: 2533 [Urine:1500]  Intake/Output this shift: Total I/O In: 10 [I.V.:10] Out: -   Lab Results: Recent Labs    07/16/20 1211 07/17/20 0010 07/17/20 1530 07/18/20 0305  WBC 8.8 9.0  --  9.7  HGB 8.4* 7.7* 8.0* 8.1*  HCT 28.3* 25.7* 26.7* 27.6*  PLT 247 228  --  248   BMET Recent Labs    07/16/20 0246 07/16/20 0807 07/17/20 0010 07/18/20 0305  NA 144 144 143 143  K 3.8 3.9 3.2* 3.2*  CL 103 106 103 103  CO2 28  --  29 28  GLUCOSE 98 90 142* 120*  BUN 48* 49* 43* 31*  CREATININE 5.90* 5.80* 5.67* 4.90*  CALCIUM 8.7*  --  8.6* 8.7*   LFT No results for  input(s): PROT, ALBUMIN, AST, ALT, ALKPHOS, BILITOT, BILIDIR, IBILI in the last 72 hours. PT/INR No results for input(s): LABPROT, INR in the last 72 hours. Hepatitis Panel No results for input(s): HEPBSAG, HCVAB, HEPAIGM, HEPBIGM in the last 72 hours.  Studies/Results: DG CHEST PORT 1 VIEW  Result Date: 07/18/2020 CLINICAL DATA:  Shortness of breath. EXAM: PORTABLE CHEST 1 VIEW COMPARISON:  June 10, 2020. FINDINGS: Stable cardiomegaly. Sternotomy wires are noted. No pneumothorax or significant pleural effusion is noted. Right internal jugular catheter is noted with distal tip in expected position of cavoatrial junction. Increased bilateral lung opacities are noted concerning for multifocal pneumonia. Bony thorax is unremarkable. IMPRESSION: Increased bilateral lung opacities are noted concerning for multifocal pneumonia. Aortic Atherosclerosis (ICD10-I70.0). Electronically Signed   By: Marijo Conception M.D.   On: 07/18/2020 09:22   Scheduled Meds: . allopurinol  100 mg Oral Daily  . ALPRAZolam  0.125 mg Oral QHS  . Chlorhexidine Gluconate Cloth  6 each Topical Daily  . cholecalciferol  2,000 Units Oral See admin instructions  . darbepoetin (ARANESP) injection - NON-DIALYSIS  25 mcg Subcutaneous Q Tue-1800  . docusate sodium  100 mg Oral Daily  . finasteride  5 mg Oral Daily  . fluticasone  1 spray Each Nare Daily  . gentamicin cream  1 application Topical Daily  . latanoprost  1 drop  Both Eyes QHS  . levothyroxine  175 mcg Oral QAC breakfast  . metoprolol tartrate  25 mg Oral BID  . multivitamin with minerals  1 tablet Oral Daily  . potassium chloride  40 mEq Oral Once  . sertraline  100 mg Oral Daily  . simvastatin  40 mg Oral QPM  . sodium bicarbonate  1,300 mg Oral TID WC   Continuous Infusions: . ampicillin (OMNIPEN) IV Stopped (07/17/20 2144)  . cefTRIAXone (ROCEPHIN)  IV Stopped (07/17/20 2245)  . dialysis solution 1.5% low-MG/low-CA    . dialysis solution 2.5%  low-MG/low-CA    . heparin 1,550 Units/hr (07/17/20 2340)   PRN Meds:.acetaminophen **OR** acetaminophen, albuterol, dianeal solution for CAPD/CCPD with heparin, ondansetron **OR** ondansetron (ZOFRAN) IV, polyethylene glycol, sodium chloride flush   ASSESMENT:   *    Painless hematochezia/ melena. 07/16/2020 colonoscopy with APC eradication of bleeding right colon AVMs.  Multiple small polyps scattered in colon, not removed in setting of acute bleed.  Nonbleeding internal hemorrhoids..  *    New acute PE, DVT, needs long-term anticoagulation. Restarted IV heparin 2 AM on 1/19.  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  On Rocephin, Unasyn, stop date 1/24.  *     ABL anemia.  Hgb 5.9 >> 8.1, improved, stable.  PRBCs x 2.    Weekly aranesp in place.   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  *     Hypokalemia.  *   ESRD, began peritoneal dialysis early 05/2020, peritoneal dialysis catheter  placed 05/14/2020 at [facility].  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  *    COPD, chronic hypoxic respiratory failure   PLAN   *    Okay to resume Eliquis.   We will arrange for GI follow-up in coming weeks.  Needs to undergo colonoscopy off blood thinners to remove polyps seen at recent colonoscopy.  However these are small and benign appearing so we can wait several months to pursue repeat colonoscopy.  *    Resume home dose of Protonix 40 mg q day.  Probably especially important given that he is now committed to long-term anticoagulation.    Azucena Freed  07/18/2020, 9:46 AM Phone [phone]

## 2020-07-18 NOTE — Progress Notes (Signed)
ANTICOAGULATION CONSULT NOTE - Follow-Up Note  Pharmacy Consult for Heparin Indication: pulmonary embolus  No Known Allergies  Patient Measurements: Height: 5\' 9"  (175.3 cm) Weight: 114 kg (251 lb 5.2 oz) IBW/kg (Calculated) : 70.7 Heparin Dosing Weight: 95.8 kg  Vital Signs: Temp: 97.5 F (36.4 C) (01/20 0642) BP: 141/77 (01/20 0642) Pulse Rate: 70 (01/20 0642)  Labs: Recent Labs    07/16/20 0246 07/16/20 0807 07/16/20 1211 07/17/20 0010 07/17/20 1000 07/17/20 1204 07/17/20 1530 07/17/20 2200 07/18/20 0305 07/18/20 0811  HGB  --  8.8* 8.4* 7.7*  --   --  8.0*  --  8.1*  --   HCT  --  26.0* 28.3* 25.7*  --   --  26.7*  --  27.6*  --   PLT  --   --  247 228  --   --   --   --  248  --   APTT  --   --  38* 106* 70*  --   --   --   --   --   HEPARINUNFRC   < >  --  0.10*  --   --  0.25*  --  0.21*  --  0.37  CREATININE  --  5.80*  --  5.67*  --   --   --   --  4.90*  --    < > = values in this interval not displayed.    Estimated Creatinine Clearance: 16.2 mL/min (A) (by C-G formula based on SCr of 4.9 mg/dL (H)).   Medications:  Infusions:  . ampicillin (OMNIPEN) IV 2 g (07/18/20 0834)  . cefTRIAXone (ROCEPHIN)  IV 2 g (07/18/20 1035)  . dialysis solution 1.5% low-MG/low-CA    . dialysis solution 2.5% low-MG/low-CA    . heparin 1,550 Units/hr (07/17/20 2340)    Assessment: 68 YOM on Apixaban PTA for recently diagnosed PE (06/10/20), also w/ history of tissue AVR who presented on 1/16 with black tarry stool. Apixaban was held for GIB evaluation and work-up. GI okayed start of Heparin bridge 6-8 from colonoscopy on 1/18. Pharmacy consulted to dose.   Heparin level this morning is therapeutic (HL 0.37, goal of 0.3-0.5). Will continue the current rate and recheck a level this afternoon to confirm.  Goal of Therapy:  Heparin level 0.3-0.5 units/ml  Monitor platelets by anticoagulation protocol: Yes   Plan:  - Continue Heparin at 1550 units/hr (15.5 ml/hr) - No  boluses requested per MD - Daily HL, CBC - Will continue to monitor for any signs/symptoms of bleeding and will follow up with heparin level in 8 hours to confirm therapeutic  Thank you for allowing pharmacy to be a part of this patient's care.  Alycia Rossetti, PharmD, BCPS Clinical Pharmacist Clinical phone for 07/18/2020: [phone] 07/18/2020 11:07 AM   **Pharmacist phone directory can now be found on [email or web address] ([email or web address]).  Listed under [place]

## 2020-07-18 NOTE — Progress Notes (Signed)
[facility] Progress Note   Subjective:   Patient seen and examined at bedside.  Reports episode of SOB last night when his O2 was disconnected.  Improved once reconnected.  Denies SOB, CP, n/v/d, abdominal pain, weakness and fatigue this AM.  Normal BM this morning, denies blood/melena.   Objective Vitals:   07/17/20 1922 07/17/20 1941 07/17/20 2003 07/18/20 0642  BP: 133/77  118/78 (!) 141/77  Pulse: 93  98 70  Resp:   20 18  Temp: 98.9 F (37.2 C)  99.3 F (37.4 C) (!) 97.5 F (36.4 C)  TempSrc: Oral  Oral   SpO2: (!) 89% 91% 92% 92%  Weight:      Height:       Physical Exam General:chronically ill appearing male in NAD Heart:RRR Lungs:CTAB, BS diminished  Abdomen:soft, NTND Extremities:2+ LE edema b/l Dialysis Access: PD cath, LU AVF maturing +b   Filed Weights   07/16/20 1914 07/17/20 1230 07/17/20 1918  Weight: 114 kg 112.9 kg 114 kg    Intake/Output Summary (Last 24 hours) at 07/18/2020 1313 Last data filed at 07/18/2020 0815 Gross per 24 hour  Intake 940.16 ml  Output 1100 ml  Net -159.84 ml    Additional Objective Labs: Basic Metabolic Panel: Recent Labs  Lab 07/16/20 0246 07/16/20 0807 07/17/20 0010 07/18/20 0305  NA 144 144 143 143  K 3.8 3.9 3.2* 3.2*  CL 103 106 103 103  CO2 28  --  29 28  GLUCOSE 98 90 142* 120*  BUN 48* 49* 43* 31*  CREATININE 5.90* 5.80* 5.67* 4.90*  CALCIUM 8.7*  --  8.6* 8.7*  PHOS 4.9*  --   --   --    Liver Function Tests: Recent Labs  Lab 07/14/20 1221  AST 26  ALT 15  ALKPHOS 66  BILITOT 0.4  PROT 5.8*  ALBUMIN 2.1*   Recent Labs  Lab 07/14/20 1221  LIPASE 42   CBC: Recent Labs  Lab 07/15/20 0610 07/15/20 2033 07/16/20 0246 07/16/20 0807 07/16/20 1211 07/17/20 0010 07/17/20 1530 07/18/20 0305  WBC 8.5  --  8.7  --  8.8 9.0  --  9.7  HGB 5.9*   < > 7.6*   < > 8.4* 7.7* 8.0* 8.1*  HCT 20.4*   < > 25.5*   < > 28.3* 25.7* 26.7* 27.6*  MCV 103.0*  --  99.2  --  99.6 99.2  --   100.0  PLT 198  --  222  --  247 228  --  248   < > = values in this interval not displayed.   Studies/Results: DG CHEST PORT 1 VIEW  Result Date: 07/18/2020 CLINICAL DATA:  Shortness of breath. EXAM: PORTABLE CHEST 1 VIEW COMPARISON:  June 10, 2020. FINDINGS: Stable cardiomegaly. Sternotomy wires are noted. No pneumothorax or significant pleural effusion is noted. Right internal jugular catheter is noted with distal tip in expected position of cavoatrial junction. Increased bilateral lung opacities are noted concerning for multifocal pneumonia. Bony thorax is unremarkable. IMPRESSION: Increased bilateral lung opacities are noted concerning for multifocal pneumonia. Aortic Atherosclerosis (ICD10-I70.0). Electronically Signed   By: Marijo Conception M.D.   On: 07/18/2020 09:22    Medications: . ampicillin (OMNIPEN) IV 2 g (07/18/20 0834)  . cefTRIAXone (ROCEPHIN)  IV 2 g (07/18/20 1035)  . dialysis solution 1.5% low-MG/low-CA    . dialysis solution 2.5% low-MG/low-CA    . heparin 1,550 Units/hr (07/17/20 2340)   . allopurinol  100 mg  Oral Daily  . ALPRAZolam  0.125 mg Oral QHS  . Chlorhexidine Gluconate Cloth  6 each Topical Daily  . cholecalciferol  2,000 Units Oral See admin instructions  . darbepoetin (ARANESP) injection - NON-DIALYSIS  25 mcg Subcutaneous Q Tue-1800  . docusate sodium  100 mg Oral Daily  . finasteride  5 mg Oral Daily  . fluticasone  1 spray Each Nare Daily  . gentamicin cream  1 application Topical Daily  . latanoprost  1 drop Both Eyes QHS  . levothyroxine  175 mcg Oral QAC breakfast  . metoprolol tartrate  25 mg Oral BID  . multivitamin with minerals  1 tablet Oral Daily  . pantoprazole  40 mg Oral Q0600  . sertraline  100 mg Oral Daily  . simvastatin  40 mg Oral QPM  . sodium bicarbonate  1,300 mg Oral TID WC    Dialysis Orders: obtain CCPD orders  Assessment/Plan 1. GI bleed- normal EGD on 1/17. colonoscopy with A few bleeding  colonicangiectasias treated with APC, multiple sm polyps throughout length and internal hemorrhoids. Per GI. 2. ESRD - CCPD completed overnight.  Discussed plan with patient and wife, to use 4.25% bags until able to lay flat to sleep then start using 1/2 4.25% & 1/2 2.5% until at dry weight.  Continue Sodium bicarb and lasix for now.  3. Hypertension/volume -BP in goal. See above plan to help reduce volume.  Pulmonary edema noted on CXR today.  Using baseline O2, lung sounds slightly diminished but no crackles.  4. Anemiaof ESRD/GI bleed- Hgb 8.1 today. See #1.  Aranesp ordered.  5. Metabolic bone disease -CCa in goal. Will check phos. on cholecalciferol q. monthly, no phosphorus binderlisted on home meds follow 6. COPD-continue meds perprimary 7. History of recent PE on Eliquis -LE duplex showed age indeterminate DVT on L, per primary note there was a broken clot that likely caused recent PE. Eliquis stopped d/t GIB. Heparin drip ordered.  8. Recent Enterococcus bacteremia/infective endocarditis -on ampicillin and ceftriaxone last dose January 24 9. History of coronary disease status post CABG and [id number] 10. Hypokalemia - K 3.2 - given K supplement   Jen Mow, PA-C [facility] 07/18/2020,1:13 PM  LOS: 4 days

## 2020-07-18 NOTE — Discharge Summary (Signed)
Physician Discharge Summary  [name] AQT:[id number] DOB: 1945/12/30 DOA: 07/14/2020  PCP: Celene Squibb, MD  Admit date: 07/14/2020 Discharge date: 07/18/2020  Admitted From: Home Disposition: Home  Recommendations for Outpatient Follow-up:  1. Follow-up with PCP as an outpatient 2. Follow-up with GI as an outpatient 3. Repeat CBC in 1 week 4. 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 GI was consulted patient was admitted E/p EGD 1/17 and Colonoscopy 1/18-shows bleeding right colon AVM treated with APC during colonoscopy  Acute blood loss anemia: -In the setting of acute lower GI bleed-FOBT positive and patient presented with black tarry stool x3 weeks. -Colonoscopy shows few bleeding colonic angiectasis status post APC.  EGD was negative. -Patient started on heparin without bolus-his H&H dropped from 8.8-7.7 and then trended up to 8.1. - Continued heparin and continued regular diet as per GIs recommendations -His H&H remained stable. No further episodes of melena. Okay to resume Eliquis and discharge home from GI standpoint. Continue Protonix 40 mg daily. Follow-up with GI outpatient  ESRD on peritoneal dialysis: -Management as per nephrology -Continued sodium bicarb,  Lasix, Aranesp -Okay to discharge from nephrology standpoint.  Infective endocarditis: Recently diagnosed.  Continue IV antibiotics Rocephin and Unasyn.  Stop date: 07/22/2020.  PE: Recently diagnosed. -Duplex of leg this admission showed age-indeterminate DVT-left femoral vein left popliteal vein and left gastrocnemius vein.  Tach noticed there seems to have broken clot likely causing PE. -Eliquis was held on admission due to GI bleeding. Patient started on heparin and his H&H remained stable. -Discussed with the pharmacy- will resume Eliquis 5 mg twice daily secondary to recent diagnosis of PE. May consider to change dose to 2.5 mg twice daily after 3 months given history of ESRD and GI bleed.  Aortic valve replacement with bioprosthetic/porcine tissue valve: -Had a TEE on 06/12/2020 which showed ejection fraction of 60 to 65% with moderately elevated aortic valve gradient but unchanged from 2019.    Coronary artery disease/chronic diastolic CHF:   No ACS symptoms.  Continued home meds statin and metoprolol.  COPD/chronic respiratory hypoxemic respiratory failure: On 2 L at baseline. -No wheezing noted on exam.  Continuedd supplemental oxygen. -Continue albuterol as needed  Hypokalemia: Potassium 3.2.  Replenished.   Magnesium level: WNL.  History of hemorrhoids: Again noted on colonoscopy. -Avoid constipation.  Continued MiraLAX.   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 Hypokalemia History of hemorrhoids Hypothyroidism Gout Hypertension BPH Hyperlipidemia Depression/anxiety Morbid obesity  with BMI of 37  Discharge Instructions  Discharge Instructions    Discharge instructions   Complete by: As directed    Follow-up with PCP in 1 week Follow-up with GI as an outpatient Repeat CBC in 1 week Continue Protonix 40 mg daily   Increase activity slowly   Complete by: As directed    No wound care   Complete by: As directed      Allergies as of 07/18/2020   No Known Allergies     Medication List    TAKE these medications   albuterol 108 (90 Base) MCG/ACT inhaler Commonly known as: VENTOLIN HFA Inhale 2 puffs into the lungs every 6 (six) hours as needed for wheezing or shortness of breath.   allopurinol 300 MG tablet Commonly known as: ZYLOPRIM Take 300 mg by mouth daily.   ALPRAZolam 0.25 MG tablet Commonly known as: XANAX Take 0.125 mg by mouth at bedtime.   amLODipine 5 MG tablet Commonly known as: NORVASC Take 5 mg by mouth daily.   ampicillin  IVPB Inject 2 g into the vein every 12 (twelve) hours. 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   apixaban 5 MG Tabs tablet Commonly known as: ELIQUIS Take 1 tablet (5 mg total) by mouth 2 (two) times daily.   aspirin EC 81 MG tablet Take 1 tablet (81 mg total) by mouth daily. Swallow whole.   Calcium-Magnesium-Zinc-D3 Tabs Take 1 tablet by mouth daily.   cefTRIAXone  IVPB Commonly known as:  ROCEPHIN Inject 2 g into the vein every 12 (twelve) hours. 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   docusate sodium 100 MG capsule Commonly known as: COLACE Take 100 mg by mouth daily.   finasteride 5 MG tablet Commonly known as: PROSCAR Take 5 mg by mouth daily.   FISH OIL PO Take 2 capsules by mouth daily.   fluticasone 50 MCG/ACT nasal spray Commonly known as: FLONASE Place 1 spray into both nostrils daily.   furosemide 40 MG tablet Commonly known as: LASIX Take 40 mg by mouth 2 (two) times daily.   ipratropium-albuterol 0.5-2.5 (3) MG/3ML Soln Commonly known as: DUONEB Take 3 mLs by nebulization every 6 (six) hours as needed (shortness of breath).   L-Lysine 500 MG Tabs Take 500 mg by mouth daily.   levothyroxine 175 MCG tablet Commonly known as: SYNTHROID Take 175 mcg by mouth daily before breakfast.   metoprolol tartrate 25 MG tablet Commonly known as: LOPRESSOR Take 25 mg by mouth 2 (two) times daily.   multivitamin tablet Take 1 tablet by mouth daily.   pantoprazole 40 MG tablet Commonly known as: PROTONIX Take 40 mg by mouth daily.   polyethylene glycol 17 g packet Commonly known as: MIRALAX / GLYCOLAX Take 17 g by mouth daily. What changed:   when to take this  reasons  to take this   potassium chloride SA 20 MEQ tablet Commonly known as: KLOR-CON Take 20 mEq by mouth 2 (two) times daily.   sertraline 100 MG tablet Commonly known as: ZOLOFT Take 100 mg by mouth daily.   simvastatin 40 MG tablet Commonly known as: ZOCOR Take 40 mg by mouth every evening.   sodium bicarbonate 650 MG  tablet Take 2 tablets (1,300 mg total) by mouth 3 (three) times daily with meals.   Travoprost (BAK Free) 0.004 % Soln ophthalmic solution Commonly known as: TRAVATAN Place 1 drop into both eyes at bedtime.   Vitamin D3 50 MCG (2000 UT) capsule Take 2,000 Units by mouth See admin instructions.       No Known Allergies  Consultations:  Nephrology  GI   Procedures/Studies: DG CHEST PORT 1 VIEW  Result Date: 07/18/2020 CLINICAL DATA:  Shortness of breath. EXAM: PORTABLE CHEST 1 VIEW COMPARISON:  June 10, 2020. FINDINGS: Stable cardiomegaly. Sternotomy wires are noted. No pneumothorax or significant pleural effusion is noted. Right internal jugular catheter is noted with distal tip in expected position of cavoatrial junction. Increased bilateral lung opacities are noted concerning for multifocal pneumonia. Bony thorax is unremarkable. IMPRESSION: Increased bilateral lung opacities are noted concerning for multifocal pneumonia. Aortic Atherosclerosis (ICD10-I70.0). Electronically Signed   By: Marijo Conception M.D.   On: 07/18/2020 09:22   VAS US DUPLEX DIALYSIS ACCESS (AVF, AVG)  Result Date: 07/01/2020 DIALYSIS ACCESS Reason for Exam: Routine follow up. Access Site: Left Upper Extremity. Access Type: Radial-cephalic AVF. History: Placed 05/30/20. Performing Technologist: Ralene Cork RVT  Examination Guidelines: A complete evaluation includes B-mode imaging, spectral Doppler, color Doppler, and power Doppler as needed of all accessible portions of each vessel. Unilateral testing is considered an integral part of a complete examination. Limited examinations for reoccurring indications may be performed as noted.  Findings: +--------------------+----------+-----------------+--------+ AVF                 PSV (cm/s)Flow Vol (mL/min)Comments +--------------------+----------+-----------------+--------+ Native artery inflow   178           453                 +--------------------+----------+-----------------+--------+ AVF Anastomosis        494                              +--------------------+----------+-----------------+--------+  +------------+----------+-------------+----------+--------+ OUTFLOW VEINPSV (cm/s)Diameter (cm)Depth (cm)Describe +------------+----------+-------------+----------+--------+ Prox Forearm   101        0.56        0.31            +------------+----------+-------------+----------+--------+ Mid Forearm    108        0.54        0.28            +------------+----------+-------------+----------+--------+ Dist Forearm   257        0.39        0.29            +------------+----------+-------------+----------+--------+  Summary: Patent radiocephalic AVF. No stenosis noted. No significant branching. Outflow vein is slightly tortuous.  *See table(s) above for measurements and observations.  Diagnosing physician: Curt Jews MD Electronically signed by Curt Jews MD on 07/01/2020 at 3:27:46 PM.   --------------------------------------------------------------------------------   Final    VAS [place] LOWER EXTREMITY VENOUS (DVT)  Result Date: 07/15/2020  Lower Venous DVT Study Indications: Pulmonary Embolism, and Edema.  Comparison Study: Previous LLE  venous study 07/21/12 - negative. Performing Technologist: Rogelia Rohrer  Examination Guidelines: A complete evaluation includes B-mode imaging, spectral Doppler, color Doppler, and power Doppler as needed of all accessible portions of each vessel. Bilateral testing is considered an integral part of a complete examination. Limited examinations for reoccurring indications may be performed as noted. The reflux portion of the exam is performed with the patient in reverse Trendelenburg.  +---------+---------------+---------+-----------+----------+--------------+ RIGHT    CompressibilityPhasicitySpontaneityPropertiesThrombus Aging  +---------+---------------+---------+-----------+----------+--------------+ CFV      Full           Yes      Yes                                 +---------+---------------+---------+-----------+----------+--------------+ SFJ      Full                                                        +---------+---------------+---------+-----------+----------+--------------+ FV Prox  Full           Yes      Yes                                 +---------+---------------+---------+-----------+----------+--------------+ FV Mid   Full           Yes      Yes                                 +---------+---------------+---------+-----------+----------+--------------+ FV DistalFull           Yes      Yes                                 +---------+---------------+---------+-----------+----------+--------------+ PFV      Full                                                        +---------+---------------+---------+-----------+----------+--------------+ POP      Full           Yes      Yes                                 +---------+---------------+---------+-----------+----------+--------------+ PTV      Full                                                        +---------+---------------+---------+-----------+----------+--------------+ PERO     Full                                                        +---------+---------------+---------+-----------+----------+--------------+   +---------+---------------+---------+-----------+--------------+---------------+  LEFT     CompressibilityPhasicitySpontaneityProperties    Thrombus Aging  +---------+---------------+---------+-----------+--------------+---------------+ CFV      Full           Yes      Yes                                      +---------+---------------+---------+-----------+--------------+---------------+ SFJ      Full                                                              +---------+---------------+---------+-----------+--------------+---------------+ FV Prox  None           No       No                       Age                                                                       Indeterminate   +---------+---------------+---------+-----------+--------------+---------------+ FV Mid   Partial        Yes      Yes                      Age                                                                       Indeterminate   +---------+---------------+---------+-----------+--------------+---------------+ FV DistalNone           Yes      Yes                      Age                                                                       Indeterminate   +---------+---------------+---------+-----------+--------------+---------------+ PFV      Full                                                             +---------+---------------+---------+-----------+--------------+---------------+ POP      Partial        Yes      Yes  Age                                                                       Indeterminate   +---------+---------------+---------+-----------+--------------+---------------+ PTV      Full                                                             +---------+---------------+---------+-----------+--------------+---------------+ PERO     Full                                                             +---------+---------------+---------+-----------+--------------+---------------+ Gastroc  Partial        No       No         One of paired Age                                                         gastrocnemius Indeterminate   +---------+---------------+---------+-----------+--------------+---------------+    Summary: RIGHT: - There is no evidence of deep vein thrombosis in the lower extremity. - There is no evidence of superficial venous thrombosis.  - No cystic  structure found in the popliteal fossa.  LEFT: - Findings consistent with age indeterminate deep vein thrombosis involving the left femoral vein, left popliteal vein, and left gastrocnemius veins. - There is no evidence of superficial venous thrombosis.  - No cystic structure found in the popliteal fossa.  *See table(s) above for measurements and observations. Electronically signed by Ruta Hinds MD on 07/15/2020 at 5:18:31 PM.    Final       Subjective: Patient seen and examined. Sitting comfortably on recliner. Tells me that he is doing fine. No further episodes of melena. Denies chest pain, shortness of breath, epigastric pain, nausea or vomiting. Wishes to go home today.  Discharge Exam: Vitals:   07/17/20 2003 07/18/20 0642  BP: 118/78 (!) 141/77  Pulse: 98 70  Resp: 20 18  Temp: 99.3 F (37.4 C) (!) 97.5 F (36.4 C)  SpO2: 92% 92%   Vitals:   07/17/20 1922 07/17/20 1941 07/17/20 2003 07/18/20 0642  BP: 133/77  118/78 (!) 141/77  Pulse: 93  98 70  Resp:   20 18  Temp: 98.9 F (37.2 C)  99.3 F (37.4 C) (!) 97.5 F (36.4 C)  TempSrc: Oral  Oral   SpO2: (!) 89% 91% 92% 92%  Weight:      Height:        General: Pt is alert, awake, not in acute distress, on 2 L of oxygen via nasal cannula, communicating well, Cardiovascular: RRR, S1/S2 +, no rubs, no gallops Respiratory: CTA bilaterally, no wheezing, no rhonchi Abdominal: Soft, NT, ND, bowel  sounds + Extremities: no edema, no cyanosis    The results of significant diagnostics from this hospitalization (including imaging, microbiology, ancillary and laboratory) are listed below for reference.     Microbiology: Recent Results (from the past 240 hour(s))  SARS CORONAVIRUS 2 (TAT 6-24 HRS) Nasopharyngeal Nasopharyngeal Swab     Status: None   Collection Time: 07/14/20  3:37 PM   Specimen: Nasopharyngeal Swab  Result Value Ref Range Status   SARS Coronavirus 2 NEGATIVE NEGATIVE Final    Comment: (NOTE) SARS-CoV-2  target nucleic acids are NOT DETECTED.  The SARS-CoV-2 RNA is generally detectable in upper and lower respiratory specimens during the acute phase of infection. Negative results do not preclude SARS-CoV-2 infection, do not rule out co-infections with other pathogens, and should not be used as the sole basis for treatment or other patient management decisions. Negative results must be combined with clinical observations, patient history, and epidemiological information. The expected result is Negative.  Fact Sheet for Patients: SugarRoll.be  Fact Sheet for Healthcare Providers: https://www.woods-mathews.com/  This test is not yet approved or cleared by the [place] FDA and  has been authorized for detection and/or diagnosis of SARS-CoV-2 by FDA under an Emergency Use Authorization (EUA). This EUA will remain  in effect (meaning this test can be used) for the duration of the COVID-19 declaration under Se ction 564(b)(1) of the Act, 21 U.S.C. section 360bbb-3(b)(1), unless the authorization is terminated or revoked sooner.  Performed at [facility], [place] [address], [place], [place] [address]      Labs: BNP (last 3 results) Recent Labs    06/08/20 1647  BNP 4,332.9*   Basic Metabolic Panel: Recent Labs  Lab 07/14/20 1221 07/15/20 0610 07/16/20 0246 07/16/20 0807 07/17/20 0010 07/17/20 1000 07/18/20 0305  NA 141 142 144 144 143  --  143  K 3.6 4.0 3.8 3.9 3.2*  --  3.2*  CL 98 99 103 106 103  --  103  CO2 _0 --  29  --  28  GLUCOSE 97 97 98 90 142*  --  120*  BUN 43* 46* 48* 49* 43*  --  31*  CREATININE 5.59* 5.76* 5.90* 5.80* 5.67*  --  4.90*  CALCIUM 8.9 8.5* 8.7*  --  8.6*  --  8.7*  MG  --   --   --   --   --  2.1  --   PHOS  --   --  4.9*  --   --   --   --    Liver Function Tests: Recent Labs  Lab 07/14/20 1221  AST 26  ALT 15  ALKPHOS 66  BILITOT 0.4  PROT 5.8*  ALBUMIN 2.1*   Recent Labs   Lab 07/14/20 1221  LIPASE 42   No results for input(s): AMMONIA in the last 168 hours. CBC: Recent Labs  Lab 07/15/20 0610 07/15/20 2033 07/16/20 0246 07/16/20 0807 07/16/20 1211 07/17/20 0010 07/17/20 1530 07/18/20 0305  WBC 8.5  --  8.7  --  8.8 9.0  --  9.7  HGB 5.9*   < > 7.6* 8.8* 8.4* 7.7* 8.0* 8.1*  HCT 20.4*   < > 25.5* 26.0* 28.3* 25.7* 26.7* 27.6*  MCV 103.0*  --  99.2  --  99.6 99.2  --  100.0  PLT 198  --  222  --  247 228  --  248   < > = values in this interval not displayed.  Cardiac Enzymes: No results for input(s): CKTOTAL, CKMB, CKMBINDEX, TROPONINI in the last 168 hours. BNP: Invalid input(s): POCBNP CBG: No results for input(s): GLUCAP in the last 168 hours. D-Dimer No results for input(s): DDIMER in the last 72 hours. Hgb A1c No results for input(s): HGBA1C in the last 72 hours. Lipid Profile No results for input(s): CHOL, HDL, LDLCALC, TRIG, CHOLHDL, LDLDIRECT in the last 72 hours. Thyroid function studies No results for input(s): TSH, T4TOTAL, T3FREE, THYROIDAB in the last 72 hours.  Invalid input(s): FREET3 Anemia work up No results for input(s): VITAMINB12, FOLATE, FERRITIN, TIBC, IRON, RETICCTPCT in the last 72 hours. Urinalysis    Component Value Date/Time   COLORURINE YELLOW 06/08/2020 1859   APPEARANCEUR HAZY (A) 06/08/2020 1859   LABSPEC 1.014 06/08/2020 1859   PHURINE 5.0 06/08/2020 1859   GLUCOSEU 50 (A) 06/08/2020 1859   HGBUR NEGATIVE 06/08/2020 1859   BILIRUBINUR NEGATIVE 06/08/2020 1859   KETONESUR NEGATIVE 06/08/2020 1859   PROTEINUR >=300 (A) 06/08/2020 1859   UROBILINOGEN 0.2 07/11/2012 0545   NITRITE NEGATIVE 06/08/2020 1859   LEUKOCYTESUR NEGATIVE 06/08/2020 1859   Sepsis Labs Invalid input(s): PROCALCITONIN,  WBC,  LACTICIDVEN Microbiology Recent Results (from the past 240 hour(s))  SARS CORONAVIRUS 2 (TAT 6-24 HRS) Nasopharyngeal Nasopharyngeal Swab     Status: None   Collection Time: 07/14/20  3:37 PM    Specimen: Nasopharyngeal Swab  Result Value Ref Range Status   SARS Coronavirus 2 NEGATIVE NEGATIVE Final    Comment: (NOTE) SARS-CoV-2 target nucleic acids are NOT DETECTED.  The SARS-CoV-2 RNA is generally detectable in upper and lower respiratory specimens during the acute phase of infection. Negative results do not preclude SARS-CoV-2 infection, do not rule out co-infections with other pathogens, and should not be used as the sole basis for treatment or other patient management decisions. Negative results must be combined with clinical observations, patient history, and epidemiological information. The expected result is Negative.  Fact Sheet for Patients: SugarRoll.be  Fact Sheet for Healthcare Providers: https://www.woods-mathews.com/  This test is not yet approved or cleared by the [place] FDA and  has been authorized for detection and/or diagnosis of SARS-CoV-2 by FDA under an Emergency Use Authorization (EUA). This EUA will remain  in effect (meaning this test can be used) for the duration of the COVID-19 declaration under Se ction 564(b)(1) of the Act, 21 U.S.C. section 360bbb-3(b)(1), unless the authorization is terminated or revoked sooner.  Performed at [facility], [place] [address], [place], [place] [address]      Time coordinating discharge: Over 30 minutes  SIGNED:   Mckinley Jewel, MD  [facility] 07/18/2020, 2:04 PM Pager   If 7PM-7AM, please contact night-coverage [email or web address]

## 2020-07-18 NOTE — TOC Initial Note (Signed)
Transition of Care [facility]) - Initial/Assessment Note    Patient Details  Name: [name] MRN: [id number] Date of Birth: [birth year]  Transition [facility]) CM/SW Contact:    Marilu Favre, RN Phone Number: 07/18/2020, 2:41 PM  Clinical Narrative:                  Patient from home with wife. Spoke to patient and wife at bedside.   Patient from home with IV ABX through 07/22/20 with [facility] .   Pam with Infusion and Kenzie with [facility] aware discharge is today.   ID pharmacy will enter OPAT script.   Patient wife has patient's portable oxygen tank in car.  Expected Discharge Plan: [place] Barriers to Discharge: No Barriers Identified   Patient Goals and CMS Choice Patient states their goals for this hospitalization and ongoing recovery are:: to return to home CMS Medicare.gov Compare Post Acute Care list provided to:: Patient Choice offered to / list presented to : [facility]  Expected Discharge Plan and Services Expected Discharge Plan: [place]   Discharge Planning Services: CM Consult Post Acute Care Choice: [place] arrangements for the past 2 months: Single Family Home Expected Discharge Date: 07/18/20               DME Arranged: N/A DME Agency: NA       HH Arranged: RN,PT,OT [place] Agency: [place] ([place]) Date HH Agency Contacted: 07/18/20 Time HH Agency Contacted: [id number] Representative spoke with at [place]: [place] Arrangements/Services Living arrangements for the past 2 months: [place] Lives with:: Spouse Patient language and need for interpreter reviewed:: Yes Do you feel safe going back to the place where you live?: Yes      Need for Family Participation in Patient Care: Yes (Comment) Care giver support system in place?: Yes (comment) Current home services: DME Criminal Activity/Legal Involvement Pertinent to Current  Situation/Hospitalization: No - Comment as needed  Activities of Daily Living Home Assistive Devices/Equipment: None ADL Screening (condition at time of admission) Patient's cognitive ability adequate to safely complete daily activities?: Yes Does the patient have difficulty seeing, even when wearing glasses/contacts?: No Does the patient have difficulty concentrating, remembering, or making decisions?: No Patient able to express need for assistance with ADLs?: Yes Independently performs ADLs?: Yes (appropriate for developmental age)  Permission Sought/Granted   Permission granted to share information with : Yes, Verbal Permission Granted  Share Information with NAME: Cale Bethard wife  Permission granted to share info w AGENCY: [place]        Emotional Assessment Appearance:: Appears stated age Attitude/Demeanor/Rapport: Engaged Affect (typically observed): Accepting Orientation: : Oriented to Self,Oriented to Place,Oriented to  Time,Oriented to Situation Alcohol / Substance Use: Not Applicable Psych Involvement: No (comment)  Admission diagnosis:  GI bleed [K92.2] Acute GI bleeding [K92.2] Anticoagulated [Z79.01] ESRD (end stage renal disease) on dialysis ([place]) [N18.6, Z99.2] Symptomatic anemia [D64.9] Patient Active Problem List   Diagnosis Date Noted  . Acute GI bleeding 07/14/2020  . Symptomatic anemia   . Hypokalemia 07/10/2020  . Endocarditis   . Elevated d-dimer   . Acute on chronic congestive heart failure ([place])   . Demand ischemia ([place])   . Severe sepsis without septic shock (CODE) ([place])   . Enterococcal bacteremia   . S/P AVR (aortic valve replacement)   . Fever 06/08/2020  . MGUS (monoclonal  gammopathy of unknown significance) 02/05/2020  . Essential (primary) hypertension   . Chronic obstructive pulmonary disease, unspecified ([place])   . Hyperlipidemia, unspecified   . Hypothyroidism, unspecified   . Atherosclerotic heart  disease of native coronary artery without angina pectoris   . Gout, unspecified   . Major depressive disorder, single episode, unspecified   . Morbid (severe) obesity due to excess calories ([place])   . Nonrheumatic aortic (valve) stenosis   . Orthostatic hypotension   . Hypertension   . Thyroid disease   . Arthritis   . COPD (chronic obstructive pulmonary disease) ([place])   . Heart murmur   . Aortic stenosis   . CAD (coronary artery disease)   . Syncope   . Depression   . Kidney stones    PCP:  Celene Squibb, MD Pharmacy:   [place], [place] [address] Phone: [phone] Fax: [phone]  [facility] [place] [place] - [address] [place] [address] Phone: [phone] Fax: [phone]     Social Determinants of Health (SDOH) Interventions    Readmission Risk Interventions No flowsheet data found.

## 2020-07-19 ENCOUNTER — Telehealth: Payer: Self-pay | Admitting: *Deleted

## 2020-07-19 ENCOUNTER — Other Ambulatory Visit: Payer: Self-pay | Admitting: Infectious Disease

## 2020-07-19 DIAGNOSIS — R7881 Bacteremia: Secondary | ICD-10-CM

## 2020-07-19 DIAGNOSIS — I330 Acute and subacute infective endocarditis: Secondary | ICD-10-CM

## 2020-07-19 NOTE — Addendum Note (Signed)
Addended by: Landis Gandy on: 07/19/2020 04:20 PM   Modules accepted: Orders

## 2020-07-19 NOTE — Telephone Encounter (Signed)
Patient's wife calling to let [facility] know Monday 1/24 is patient's last dose of antibiotics. He has a tunneled PICC, will need order placed for IR to remove this if appropriate. Next clinic follow up appointment is 08/21/20. Please advise. Landis Gandy, RN

## 2020-07-19 NOTE — Telephone Encounter (Signed)
Sharyn Lull I put order in for it is too bad that he did not let me know that he was in the hospital as recently as yesterday because he could have had an easily removed by radiology while he was in the hospital

## 2020-07-19 NOTE — Telephone Encounter (Signed)
Left message in IR with Caryl Pina.  Notified patient's wife and Melissa at [place] of plan, patient/nursing will need to maintain PICC until it is removed. Landis Gandy, RN

## 2020-07-20 NOTE — Telephone Encounter (Signed)
Thanks Michelle

## 2020-07-23 ENCOUNTER — Other Ambulatory Visit (HOSPITAL_COMMUNITY)
Admission: RE | Admit: 2020-07-23 | Discharge: 2020-07-23 | Disposition: A | Discharge status: Home / Self Care | Payer: Managed Care, Other (non HMO) | Source: Other Acute Inpatient Hospital | Attending: Dermatology | Admitting: Dermatology

## 2020-07-23 DIAGNOSIS — R7881 Bacteremia: Secondary | ICD-10-CM | POA: Insufficient documentation

## 2020-07-23 DIAGNOSIS — B952 Enterococcus as the cause of diseases classified elsewhere: Secondary | ICD-10-CM | POA: Insufficient documentation

## 2020-07-23 LAB — CBC WITH DIFFERENTIAL/PLATELET
Abs Immature Granulocytes: 0.04 10*3/uL (ref 0.00–0.07)
Basophils Absolute: 0.1 10*3/uL (ref 0.0–0.1)
Basophils Relative: 1 %
Eosinophils Absolute: 0.6 10*3/uL — ABNORMAL HIGH (ref 0.0–0.5)
Eosinophils Relative: 7 %
HCT: 28 % — ABNORMAL LOW (ref 39.0–52.0)
Hemoglobin: 8.2 g/dL — ABNORMAL LOW (ref 13.0–17.0)
Immature Granulocytes: 0 %
Lymphocytes Relative: 37 %
Lymphs Abs: 3.4 10*3/uL (ref 0.7–4.0)
MCH: 29.3 pg (ref 26.0–34.0)
MCHC: 29.3 g/dL — ABNORMAL LOW (ref 30.0–36.0)
MCV: 100 fL (ref 80.0–100.0)
Monocytes Absolute: 0.8 10*3/uL (ref 0.1–1.0)
Monocytes Relative: 9 %
Neutro Abs: 4.2 10*3/uL (ref 1.7–7.7)
Neutrophils Relative %: 46 %
Platelets: 250 10*3/uL (ref 150–400)
RBC: 2.8 MIL/uL — ABNORMAL LOW (ref 4.22–5.81)
RDW: 17 % — ABNORMAL HIGH (ref 11.5–15.5)
WBC: 9.1 10*3/uL (ref 4.0–10.5)
nRBC: 0 % (ref 0.0–0.2)

## 2020-07-23 LAB — SEDIMENTATION RATE: Sed Rate: 118 mm/hr — ABNORMAL HIGH (ref 0–16)

## 2020-07-23 LAB — BASIC METABOLIC PANEL
Anion gap: 10 (ref 5–15)
BUN: 31 mg/dL — ABNORMAL HIGH (ref 8–23)
CO2: 29 mmol/L (ref 22–32)
Calcium: 8.5 mg/dL — ABNORMAL LOW (ref 8.9–10.3)
Chloride: 101 mmol/L (ref 98–111)
Creatinine, Ser: 4.43 mg/dL — ABNORMAL HIGH (ref 0.61–1.24)
GFR, Estimated: 13 mL/min — ABNORMAL LOW (ref >60)
Glucose, Bld: 88 mg/dL (ref 70–99)
Potassium: 4.3 mmol/L (ref 3.5–5.1)
Sodium: 140 mmol/L (ref 135–145)

## 2020-07-23 LAB — C-REACTIVE PROTEIN: CRP: 3.5 mg/dL — ABNORMAL HIGH (ref <1.0)

## 2020-07-26 ENCOUNTER — Ambulatory Visit (HOSPITAL_COMMUNITY)
Admission: RE | Admit: 2020-07-26 | Discharge: 2020-07-26 | Disposition: A | Discharge status: Home / Self Care | Payer: Managed Care, Other (non HMO) | Source: Ambulatory Visit | Attending: Infectious Disease | Admitting: Infectious Disease

## 2020-07-26 ENCOUNTER — Other Ambulatory Visit: Payer: Self-pay

## 2020-07-26 DIAGNOSIS — R7881 Bacteremia: Secondary | ICD-10-CM | POA: Insufficient documentation

## 2020-07-26 DIAGNOSIS — Z8639 Personal history of other endocrine, nutritional and metabolic disease: Secondary | ICD-10-CM | POA: Insufficient documentation

## 2020-07-26 DIAGNOSIS — N186 End stage renal disease: Secondary | ICD-10-CM | POA: Insufficient documentation

## 2020-07-26 DIAGNOSIS — I509 Heart failure, unspecified: Secondary | ICD-10-CM | POA: Insufficient documentation

## 2020-07-26 DIAGNOSIS — Z452 Encounter for adjustment and management of vascular access device: Secondary | ICD-10-CM | POA: Insufficient documentation

## 2020-07-26 DIAGNOSIS — B952 Enterococcus as the cause of diseases classified elsewhere: Secondary | ICD-10-CM

## 2020-07-26 HISTORY — PX: IR REMOVAL TUN CV CATH W/O FL: IMG2289

## 2020-07-26 MED ORDER — LIDOCAINE HCL 1 % IJ SOLN
INTRAMUSCULAR | Status: DC | PRN
  Administered 2020-07-26: 20 mL via INTRADERMAL

## 2020-07-26 MED ORDER — LIDOCAINE HCL 1 % IJ SOLN
INTRAMUSCULAR | Status: AC
  Filled 2020-07-26: qty 20

## 2020-07-26 NOTE — Procedures (Signed)
Successful removal of tunneled (R)IJ CVC No complications.  Ascencion Dike PA-C Interventional Radiology 07/26/2020 2:12 PM

## 2020-08-21 ENCOUNTER — Encounter: Payer: Self-pay | Admitting: Infectious Disease

## 2020-08-21 ENCOUNTER — Ambulatory Visit (INDEPENDENT_AMBULATORY_CARE_PROVIDER_SITE_OTHER): Payer: Managed Care, Other (non HMO) | Admitting: Infectious Disease

## 2020-08-21 ENCOUNTER — Other Ambulatory Visit: Payer: Self-pay

## 2020-08-21 VITALS — BP 132/71 | HR 69 | Temp 98.2°F | Wt 254.0 lb

## 2020-08-21 DIAGNOSIS — R7881 Bacteremia: Secondary | ICD-10-CM | POA: Diagnosis not present

## 2020-08-21 DIAGNOSIS — D472 Monoclonal gammopathy: Secondary | ICD-10-CM

## 2020-08-21 DIAGNOSIS — R6520 Severe sepsis without septic shock: Secondary | ICD-10-CM

## 2020-08-21 DIAGNOSIS — B952 Enterococcus as the cause of diseases classified elsewhere: Secondary | ICD-10-CM

## 2020-08-21 DIAGNOSIS — I330 Acute and subacute infective endocarditis: Secondary | ICD-10-CM

## 2020-08-21 DIAGNOSIS — Z952 Presence of prosthetic heart valve: Secondary | ICD-10-CM | POA: Diagnosis not present

## 2020-08-21 DIAGNOSIS — I2699 Other pulmonary embolism without acute cor pulmonale: Secondary | ICD-10-CM

## 2020-08-21 HISTORY — DX: Other pulmonary embolism without acute cor pulmonale: I26.99

## 2020-08-21 NOTE — Progress Notes (Signed)
Subjective:  Chief complaint follow-up for bacteremia and endocarditis  Patient ID: [name], male    DOB: [birth year], 75 y.o.   MRN: [id number]  HPI   Mr. Broz is 75 year old Caucasian man with multiple medical problems including bioprosthetic aortic valve in 2014, bilateral total knee arthroplasties, chronic kidney disease on peritoneal dialysis who was admitted with sepsis and found to have enterococcal bacteremia.  While his transesophageal echocardiogram failed to show evidence of a vegetation on TEE he had had a VQ scan in the context of a positive D-dimer that suggested a possible embolism in the lungs.  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  He has completed therapy and is here for surveillance blood cultures to prove cure of his bacteremia.   Past Medical History:  Diagnosis Date  . Aortic stenosis   . Arthritis   . Atherosclerotic heart disease of native coronary artery without angina pectoris   . CAD (coronary artery disease)   . Chronic obstructive pulmonary disease, unspecified ([place])   . COPD (chronic obstructive pulmonary disease) ([place])   . Depression   . Essential (primary) hypertension   . Gout, unspecified   . Heart murmur   . Hemorrhoid   . History of peritoneal dialysis   . Hyperlipidemia, unspecified   . Hypertension   . Hypokalemia 07/10/2020  . Hypothyroidism   . Hypothyroidism, unspecified   . Kidney stones   . Major depressive disorder, single episode, unspecified   . Morbid (severe) obesity due to excess calories ([place])   . Nonrheumatic aortic (valve) stenosis   . Orthostatic hypotension   . Syncope   . Thyroid disease     Past Surgical History:  Procedure Laterality Date  . ANGIOPASTY    . AORTIC VALVE REPLACEMENT  07/11/2012    Procedure: AORTIC VALVE REPLACEMENT (AVR);  Surgeon: Gaye Pollack, MD;  Location: [place];  Service: Open Heart Surgery;  Laterality: N/A;  . AV FISTULA PLACEMENT Left 05/30/2020   Procedure: ARTERIOVENOUS (AV) FISTULA CREATION LEFT;  Surgeon: [name], MD;  Location: [facility];  Service: Vascular;  Laterality: Left;  . CARDIAC CATHETERIZATION    . CAROTID ENDARTERECTOMY    . COLONOSCOPY WITH PROPOFOL N/A 07/16/2020   Procedure: COLONOSCOPY WITH PROPOFOL;  Surgeon: Doran Stabler, MD;  Location: [place];  Service: Gastroenterology;  Laterality: N/A;  . CORONARY ARTERY BYPASS GRAFT  07/11/2012   Procedure: CORONARY ARTERY BYPASS GRAFTING (CABG);  Surgeon: Gaye Pollack, MD;  Location: [place];  Service: Open Heart Surgery;  Laterality: N/A;  CABG x one,  using right leg greater saphenous vein harvested endoscopically  . ESOPHAGOGASTRODUODENOSCOPY (EGD) WITH PROPOFOL N/A 07/15/2020   Procedure: ESOPHAGOGASTRODUODENOSCOPY (EGD) WITH PROPOFOL;  Surgeon: Doran Stabler, MD;  Location: [place];  Service: Gastroenterology;  Laterality: N/A;  . HOT HEMOSTASIS N/A 07/16/2020   Procedure: HOT HEMOSTASIS (ARGON PLASMA COAGULATION/BICAP);  Surgeon: Doran Stabler, MD;  Location: [place];  Service: Gastroenterology;  Laterality: N/A;  . INTRAOPERATIVE TRANSESOPHAGEAL ECHOCARDIOGRAM  07/11/2012   Procedure: INTRAOPERATIVE TRANSESOPHAGEAL ECHOCARDIOGRAM;  Surgeon: Gaye Pollack, MD;  Location: [place];  Service: Open Heart Surgery;  Laterality: N/A;  . IR PERC TUN PERIT CATH WO PORT S&I D[place]a [place]  06/13/2020  . IR REMOVAL TUN CV CATH  W/O FL  07/26/2020  . IR US GUIDE VASC ACCESS RIGHT  06/13/2020  . JOINT REPLACEMENT    . TEE WITHOUT CARDIOVERSION N/A 06/12/2020   Procedure: TRANSESOPHAGEAL ECHOCARDIOGRAM (TEE);  Surgeon: Skeet Latch, MD;  [facility];  Service: Cardiovascular;  Laterality: N/A;    Family History  Problem Relation Age of Onset  . Heart disease Mother   .  Heart disease Father   . Hypertension Father       Social History   Socioeconomic History  . Marital status: Married    Spouse name: Not on file  . Number of children: 1  . Years of education: HS  . Highest education level: Not on file  Occupational History  . Occupation: Retired   Tobacco Use  . Smoking status: Former Smoker    Packs/day: 3.00    Years: 50.00    Pack years: 150.00    Types: Cigarettes    Quit date: 12/13/2010    Years since quitting: 9.6  . Smokeless tobacco: Former Systems developer  . Tobacco comment: Electronic cigarette...USES INFREQUENTLY NOW  Vaping Use  . Vaping Use: Former  . Start date: 02/01/2013  . Quit date: 06/08/2020  Substance and Sexual Activity  . Alcohol use: Not Currently    Comment: Rare  . Drug use: No  . Sexual activity: Not Currently  Other Topics Concern  . Not on file  Social History Narrative   Drinks about 3 cups of coffee a day, drinks about 2 sundrops a day    Social Determinants of Radio broadcast assistant Strain: Not on file  Food Insecurity: Not on file  Transportation Needs: Not on file  Physical Activity: Not on file  Stress: Not on file  Social Connections: Not on file    No Known Allergies   Current Outpatient Medications:  .  albuterol (PROVENTIL HFA;VENTOLIN HFA) 108 (90 BASE) MCG/ACT inhaler, Inhale 2 puffs into the lungs every 6 (six) hours as needed for wheezing or shortness of breath. , Disp: , Rfl:  .  allopurinol (ZYLOPRIM) 300 MG tablet, Take 300 mg by mouth daily., Disp: , Rfl:  .  ALPRAZolam (XANAX) 0.25 MG tablet, Take 0.125 mg by mouth at bedtime., Disp: , Rfl:  .  amLODipine (NORVASC) 5 MG tablet, Take 5 mg by mouth daily., Disp: , Rfl:  .  apixaban (ELIQUIS) 5 MG TABS tablet, Take 1 tablet (5 mg total) by mouth 2 (two) times daily., Disp: 60 tablet, Rfl: 3 .  aspirin EC 81 MG tablet, Take 1 tablet (81 mg total) by mouth daily. Swallow whole., Disp: 150 tablet, Rfl: 2 .  B Complex-C-Folic Acid (RENAL) 1  MG CAPS, Take 1 capsule by mouth daily., Disp: , Rfl:  .  Cholecalciferol (VITAMIN D3) 50 MCG (2000 UT) capsule, Take 2,000 Units by mouth See admin instructions., Disp: , Rfl:  .  docusate sodium (COLACE) 100 MG capsule, Take 100 mg by mouth daily., Disp: , Rfl:  .  finasteride (PROSCAR) 5 MG tablet, Take 5 mg by mouth daily., Disp: , Rfl:  .  fluconazole (DIFLUCAN) 100 MG tablet, Take 100 mg by mouth daily., Disp: , Rfl:  .  fluticasone (FLONASE) 50 MCG/ACT nasal spray, Place 1 spray into both nostrils daily., Disp: , Rfl:  .  furosemide (LASIX) 80 MG tablet, Take 80 mg by mouth 2 (two) times daily., Disp: , Rfl:  .  ipratropium-albuterol (DUONEB) 0.5-2.5 (3) MG/3ML SOLN, Take 3 mLs by nebulization every 6 (six) hours  as needed (shortness of breath)., Disp: , Rfl:  .  L-Lysine 500 MG TABS, Take 500 mg by mouth daily. , Disp: , Rfl:  .  levothyroxine (SYNTHROID) 175 MCG tablet, Take 175 mcg by mouth daily before breakfast. , Disp: , Rfl:  .  metoprolol tartrate (LOPRESSOR) 25 MG tablet, Take 25 mg by mouth 2 (two) times daily., Disp: , Rfl:  .  Multiple Minerals-Vitamins (CALCIUM-MAGNESIUM-ZINC-D3) TABS, Take 1 tablet by mouth daily., Disp: , Rfl:  .  Multiple Vitamin (MULTIVITAMIN) tablet, Take 1 tablet by mouth daily., Disp: , Rfl:  .  Omega-3 Fatty Acids (FISH OIL PO), Take 2 capsules by mouth daily. , Disp: , Rfl:  .  pantoprazole (PROTONIX) 40 MG tablet, Take 40 mg by mouth daily., Disp: , Rfl:  .  Potassium Bicarb-Citric Acid 20 MEQ TBEF, Take by mouth., Disp: , Rfl:  .  potassium chloride SA (KLOR-CON) 20 MEQ tablet, Take 20 mEq by mouth 2 (two) times daily., Disp: , Rfl:  .  sertraline (ZOLOFT) 100 MG tablet, Take 100 mg by mouth daily., Disp: , Rfl:  .  simvastatin (ZOCOR) 40 MG tablet, Take 40 mg by mouth every evening., Disp: , Rfl:  .  sodium bicarbonate 650 MG tablet, Take 2 tablets (1,300 mg total) by mouth 3 (three) times daily with meals., Disp: 90 tablet, Rfl: 2 .  Travoprost,  BAK Free, (TRAVATAN) 0.004 % SOLN ophthalmic solution, Place 1 drop into both eyes at bedtime., Disp: , Rfl:  .  losartan (COZAAR) 100 MG tablet, Take 100 mg by mouth daily. (Patient not taking: Reported on 08/21/2020), Disp: , Rfl:  .  polyethylene glycol (MIRALAX / GLYCOLAX) 17 g packet, Take 17 g by mouth daily. (Patient not taking: Reported on 08/21/2020), Disp: 14 each, Rfl: 0   Review of Systems  Constitutional: Negative for activity change, appetite change, chills, diaphoresis, fatigue, fever and unexpected weight change.  HENT: Negative for congestion, rhinorrhea, sinus pressure, sneezing, sore throat and trouble swallowing.   Eyes: Negative for photophobia and visual disturbance.  Respiratory: Negative for cough, chest tightness, shortness of breath, wheezing and stridor.   Cardiovascular: Negative for chest pain, palpitations and leg swelling.  Gastrointestinal: Negative for abdominal distention, abdominal pain, anal bleeding, blood in stool, constipation, diarrhea, nausea and vomiting.  Genitourinary: Negative for difficulty urinating, dysuria, flank pain and hematuria.  Musculoskeletal: Negative for arthralgias, back pain, gait problem, joint swelling and myalgias.  Skin: Negative for color change, pallor, rash and wound.  Neurological: Negative for dizziness, tremors, seizures, weakness and light-headedness.  Hematological: Negative for adenopathy. Does not bruise/bleed easily.  Psychiatric/Behavioral: Negative for agitation, behavioral problems, confusion, decreased concentration, dysphoric mood and sleep disturbance.       Objective:   Physical Exam Constitutional:      Appearance: He is well-developed.  HENT:     Head: Normocephalic and atraumatic.  Eyes:     Conjunctiva/sclera: Conjunctivae normal.  Cardiovascular:     Rate and Rhythm: Normal rate and regular rhythm.     Heart sounds: No murmur heard. No friction rub. No gallop.   Pulmonary:     Effort: Pulmonary  effort is normal. No respiratory distress.     Breath sounds: Normal breath sounds. No stridor. No wheezing or rhonchi.  Abdominal:     General: There is no distension.     Palpations: Abdomen is soft.  Musculoskeletal:        General: No tenderness. Normal range of motion.     Cervical  back: Normal range of motion and neck supple.  Skin:    General: Skin is warm and dry.     Coloration: Skin is not pale.     Findings: No erythema or rash.  Neurological:     General: No focal deficit present.     Mental Status: He is alert and oriented to person, place, and time.  Psychiatric:        Mood and Affect: Mood normal.        Behavior: Behavior normal.        Thought Content: Thought content normal.        Judgment: Judgment normal.              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

## 2020-08-28 LAB — CULTURE, BLOOD (SINGLE)
MICRO NUMBER:: 11574828
MICRO NUMBER:: 11574829

## 2020-09-03 NOTE — Telephone Encounter (Signed)
Labs drawn 2/23 were insufficient for cultures. Do you still want blood cultures x 2?  Please advise, and I will send written orders to [facility] locally if this is more convenient for the patient. Thank you! Landis Gandy, RN

## 2020-09-04 NOTE — Telephone Encounter (Signed)
Orders faxed to [facility] per Dr. Tommy Medal.   Beryle Flock, RN

## 2020-09-16 ENCOUNTER — Other Ambulatory Visit: Payer: Self-pay | Admitting: Infectious Disease

## 2020-09-22 LAB — CULTURE, BLOOD (SINGLE)
MICRO NUMBER:: 11676534
MICRO NUMBER:: 11676535
Result:: NO GROWTH
SPECIMEN QUALITY:: ADEQUATE

## 2020-10-24 ENCOUNTER — Other Ambulatory Visit: Payer: Self-pay

## 2020-10-24 ENCOUNTER — Ambulatory Visit (INDEPENDENT_AMBULATORY_CARE_PROVIDER_SITE_OTHER): Payer: Managed Care, Other (non HMO) | Admitting: Cardiology

## 2020-10-24 ENCOUNTER — Encounter: Payer: Self-pay | Admitting: Cardiology

## 2020-10-24 VITALS — BP 142/82 | HR 66 | Wt 255.0 lb

## 2020-10-24 DIAGNOSIS — I25119 Atherosclerotic heart disease of native coronary artery with unspecified angina pectoris: Secondary | ICD-10-CM | POA: Diagnosis not present

## 2020-10-24 DIAGNOSIS — Z953 Presence of xenogenic heart valve: Secondary | ICD-10-CM | POA: Diagnosis not present

## 2020-10-24 DIAGNOSIS — Z86711 Personal history of pulmonary embolism: Secondary | ICD-10-CM | POA: Diagnosis not present

## 2020-10-24 DIAGNOSIS — E782 Mixed hyperlipidemia: Secondary | ICD-10-CM

## 2020-10-24 MED ORDER — ELIQUIS 5 MG PO TABS: 5.0000 mg | ORAL_TABLET | Freq: Two times a day (BID) | ORAL | 3 refills | Status: DC

## 2020-10-24 NOTE — Patient Instructions (Signed)
Medication Instructions:  STOP Eliquis the end of June 2022  All other medications stay the same.   *If you need a refill on your cardiac medications before your next appointment, please call your pharmacy*   Lab Work: None today If you have labs (blood work) drawn today and your tests are completely normal, you will receive your results only by: Marland Kitchen MyChart Message (if you have MyChart) OR . A paper copy in the mail If you have any lab test that is abnormal or we need to change your treatment, we will call you to review the results.   Testing/Procedures: None today   Follow-Up: At [facility], you and your health needs are our priority.  As part of our continuing mission to provide you with exceptional heart care, we have created designated Provider Care Teams.  These Care Teams include your primary Cardiologist (physician) and Advanced Practice Providers (APPs -  Physician Assistants and Nurse Practitioners) who all work together to provide you with the care you need, when you need it.  We recommend signing up for the patient portal called "MyChart".  Sign up information is provided on this After Visit Summary.  MyChart is used to connect with patients for Virtual Visits (Telemedicine).  Patients are able to view lab/test results, encounter notes, upcoming appointments, etc.  Non-urgent messages can be sent to your provider as well.   To learn more about what you can do with MyChart, go to [email or web address].    Your next appointment:   6 month(s)  The format for your next appointment:   In Person  Provider:   Rozann Lesches, MD   Other Instructions None

## 2020-10-24 NOTE — Progress Notes (Signed)
Cardiology Office Note  Date: 10/24/2020   ID: Austin [name], [name] Austin [name] [name], MRN [id number]  PCP:  Austin Squibb, MD  Cardiologist:  Austin Lesches, MD Electrophysiologist:  None   Chief Complaint  Patient presents with  . Cardiac follow-up    History of Present Illness: Austin [name] [name] is a 75 y.o. male former patient of Austin [name] [name] now presenting to establish follow-up with me.  I reviewed his records and updated the chart.  He was last seen in January by Ms. Bonnell Public PA-C.  He is here today with his wife for a follow-up visit.  States that he has been doing well.  No angina symptoms or worsening shortness of breath beyond NYHA class II.  He continues on peritoneal dialysis at home, assisted by his wife.  I reviewed his medications which are noted below.  Diagnosis of subsegmental pulmonary emboli was made by VQ scan in December 2021.  Anticipate treatment course through June and then back to aspirin alone.  We discussed this today.  We are requesting his most recent lab work from Austin [name] [name].  He has had no intolerances to statin therapy.  Echocardiogram and TEE results from December 2021 are noted below.  Past Medical History:  Diagnosis Date  . Aortic stenosis    Status post AVR, 25 mm Edwards pericardial Magna-Ease valve 2014  . Arthritis   . CAD (coronary artery disease)    Status post SVG to OM 2014  . COPD (chronic obstructive pulmonary disease) ([place])   . Depression   . ESRD on peritoneal dialysis ([place])   . Essential hypertension   . Gout   . Hemorrhoid   . Hypothyroidism   . Kidney stones   . Mixed hyperlipidemia   . Orthostatic hypotension   . Pulmonary emboli ([place]) 08/21/2020  . Syncope     Past Surgical History:  Procedure Laterality Date  . ANGIOPASTY    . AORTIC VALVE REPLACEMENT  07/11/2012   Procedure: AORTIC VALVE REPLACEMENT (AVR);  Surgeon: Gaye Pollack, MD;  Location: [place];  Service: Open Heart Surgery;  Laterality: N/A;  . AV FISTULA PLACEMENT Left  05/30/2020   Procedure: ARTERIOVENOUS (AV) FISTULA CREATION LEFT;  Surgeon: [name], MD;  Location: [facility];  Service: Vascular;  Laterality: Left;  . CARDIAC CATHETERIZATION    . CAROTID ENDARTERECTOMY    . COLONOSCOPY WITH PROPOFOL N/A 07/16/2020   Procedure: COLONOSCOPY WITH PROPOFOL;  Surgeon: Austin Stabler, MD;  Location: [place];  Service: Gastroenterology;  Laterality: N/A;  . CORONARY ARTERY BYPASS GRAFT  07/11/2012   Procedure: CORONARY ARTERY BYPASS GRAFTING (CABG);  Surgeon: Gaye Pollack, MD;  Location: [place];  Service: Open Heart Surgery;  Laterality: N/A;  CABG x one,  using right leg greater saphenous vein harvested endoscopically  . ESOPHAGOGASTRODUODENOSCOPY (EGD) WITH PROPOFOL N/A 07/15/2020   Procedure: ESOPHAGOGASTRODUODENOSCOPY (EGD) WITH PROPOFOL;  Surgeon: Austin Stabler, MD;  Location: [place];  Service: Gastroenterology;  Laterality: N/A;  . HOT HEMOSTASIS N/A 07/16/2020   Procedure: HOT HEMOSTASIS (ARGON PLASMA COAGULATION/BICAP);  Surgeon: Austin Stabler, MD;  Location: [place];  Service: Gastroenterology;  Laterality: N/A;  . INTRAOPERATIVE TRANSESOPHAGEAL ECHOCARDIOGRAM  07/11/2012   Procedure: INTRAOPERATIVE TRANSESOPHAGEAL ECHOCARDIOGRAM;  Surgeon: Gaye Pollack, MD;  Location: [facility];  Service: Open Heart Surgery;  Laterality: N/A;  . IR PERC TUN PERIT CATH WO PORT S&I [place]  06/13/2020  . IR REMOVAL TUN CV CATH W/O FL  07/26/2020  . IR US GUIDE VASC ACCESS RIGHT  06/13/2020  . JOINT REPLACEMENT    . TEE WITHOUT CARDIOVERSION N/A 06/12/2020   Procedure: TRANSESOPHAGEAL ECHOCARDIOGRAM (TEE);  Surgeon: Austin Latch, MD;  Location: [facility] ENDOSCOPY;  Service: Cardiovascular;  Laterality: N/A;    Current Outpatient Medications  Medication Sig Dispense Refill  . albuterol (PROVENTIL HFA;VENTOLIN HFA) 108 (90 BASE) MCG/ACT inhaler Inhale 2 puffs into the lungs every 6 (six) hours as needed for wheezing or shortness of breath.     .  allopurinol (ZYLOPRIM) 300 MG tablet Take 300 mg by mouth daily.    Marland Kitchen aspirin EC 81 MG tablet Take 1 tablet (81 mg total) by mouth daily. Swallow whole. 150 tablet 2  . AURYXIA 1 GM 210 MG(Fe) tablet Take 1,000 mg by mouth 3 (three) times daily with meals.    . B Complex-C-Folic Acid (RENAL) 1 MG CAPS Take 1 capsule by mouth daily.    . Cholecalciferol (VITAMIN D3) 50 MCG (2000 UT) capsule Take 2,000 Units by mouth See admin instructions.    . docusate sodium (COLACE) 100 MG capsule Take 100 mg by mouth 2 (two) times daily.    . finasteride (PROSCAR) 5 MG tablet Take 5 mg by mouth daily.    . fluconazole (DIFLUCAN) 100 MG tablet Take 100 mg by mouth daily.    . fluticasone (FLONASE) 50 MCG/ACT nasal spray Place 1 spray into both nostrils daily.    . furosemide (LASIX) 80 MG tablet Take 80 mg by mouth 2 (two) times daily.    Marland Kitchen gabapentin (NEURONTIN) 100 MG capsule Take 100 mg by mouth at bedtime.    Marland Kitchen ipratropium-albuterol (DUONEB) 0.5-2.5 (3) MG/3ML SOLN Take 3 mLs by nebulization every 6 (six) hours as needed (shortness of breath).    . L-Lysine 500 MG TABS Take 500 mg by mouth daily.     Marland Kitchen levothyroxine (SYNTHROID) 175 MCG tablet Take 175 mcg by mouth daily before breakfast.     . metoprolol tartrate (LOPRESSOR) 25 MG tablet Take 25 mg by mouth 2 (two) times daily.    . Multiple Minerals-Vitamins (CALCIUM-MAGNESIUM-ZINC-D3) TABS Take 1 tablet by mouth daily.    . Multiple Vitamin (MULTIVITAMIN) tablet Take 1 tablet by mouth daily.    . Omega-3 Fatty Acids (FISH OIL PO) Take 2 capsules by mouth daily.     . pantoprazole (PROTONIX) 40 MG tablet Take 40 mg by mouth daily.    . sertraline (ZOLOFT) 100 MG tablet Take 100 mg by mouth daily.    . simvastatin (ZOCOR) 40 MG tablet Take 40 mg by mouth every evening.    . Travoprost, BAK Free, (TRAVATAN) 0.004 % SOLN ophthalmic solution Place 1 drop into both eyes at bedtime.    Marland Kitchen apixaban (ELIQUIS) 5 MG TABS tablet Take 1 tablet (5 mg total) by mouth 2  (two) times daily. STOP Eliquis the END OF June 2022 60 tablet 3  . polyethylene glycol (MIRALAX / GLYCOLAX) 17 g packet Take 17 g by mouth daily. (Patient not taking: No sig reported) 14 each 0   No current facility-administered medications for this visit.   Allergies:  Patient has no known allergies.   ROS: No palpitations or syncope.  Intermittent leg swelling.  Weight has been relatively stable.  Physical Exam: VS:  BP (!) 142/82   Pulse 66   Wt 255 lb (115.7 kg)   SpO2 95%   BMI 37.66 kg/m , BMI Body mass index is 37.66 kg/m.  Wt  Readings from Last 3 Encounters:  10/24/20 255 lb (115.7 kg)  08/21/20 254 lb (115.2 kg)  07/17/20 251 lb 5.2 oz (114 kg)    General: Patient appears comfortable at rest. HEENT: Conjunctiva and lids normal, wearing a mask. Neck: Supple, no elevated JVP, left carotid bruit, no thyromegaly. Lungs: Clear to auscultation, nonlabored breathing at rest. Cardiac: Regular rate and rhythm, no S3, 2/6 systolic murmur, no pericardial rub. Abdomen: Protuberant, bowel sounds present. Extremities: Mild lower leg edema.  ECG:  An ECG dated 07/15/2020 was personally reviewed today and demonstrated:  Sinus rhythm with prolonged PR interval  Recent Labwork: 06/08/2020: B Natriuretic Peptide 2,673.0 07/14/2020: ALT 15; AST 26 07/17/2020: Magnesium 2.1 07/23/2020: BUN 31; Creatinine, Ser 4.43; Hemoglobin 8.2; Platelets 250; Potassium 4.3; Sodium 140     Component Value Date/Time   CHOL 169 01/26/2019 0000   TRIG 80 06/08/2020 1621   HDL 45 01/26/2019 0000   CHOLHDL 4.5 08/23/2012 0710   VLDL 43 (H) 08/23/2012 0710   LDLCALC 99 01/26/2019 0000    Other Studies Reviewed Today:  Echocardiogram 06/10/2020: 1. Left ventricular ejection fraction, by estimation, is 60 to 65%. The  left ventricle has normal function. The left ventricle has no regional  wall motion abnormalities. There is moderate left ventricular hypertrophy.  Left ventricular diastolic   parameters are consistent with Grade II diastolic dysfunction  (pseudonormalization).  2. Right ventricular systolic function is mildly reduced. The right  ventricular size is mildly enlarged. There is severely elevated pulmonary  artery systolic pressure. The estimated right ventricular systolic  pressure is 29.9 mmHg.  3. Left atrial size was mild to moderately dilated.  4. Right atrial size was mildly dilated.  5. The mitral valve is degenerative. Mild to moderate mitral valve  regurgitation. Mild to moderate mitral stenosis, mean gradient 11 mmHg but  MVA 2.3 cm^2 by PHT. Suspect elevated gradient related in part to high  flow. Moderate to severe mitral annular  calcification.  6. Bioprosthetic aortic valve. Mean gradient 28 mmHg, moderately  elevated. No significant regurgitation.  7. Tricuspid valve regurgitation is moderate.  8. The inferior vena cava is dilated in size with <50% respiratory  variability, suggesting right atrial pressure of 15 mmHg.  9. Neither the bioprosthetic aortic valve nor the heavily calcified  mitral valve/annulus were visualized well enough to rule out endocarditis.  Suggest TEE.   TEE 06/12/2020: 1. Left ventricular ejection fraction, by estimation, is 60 to 65%. The  left ventricle has normal function. The left ventricle has no regional  wall motion abnormalities.  2. Right ventricular systolic function is normal. The right ventricular  size is normal.  3. Left atrial size was mildly dilated. No left atrial/left atrial  appendage thrombus was detected.  4. Right atrial size was mildly dilated.  5. Multiple regurgitant jets. The mitral valve is degenerative. There is  a mobile density on the anterior mitral valve leaflet that is poorly  visualized but seems to have also been present on the TTE 07/07/2017. The  mitral valve is degenerative. Mild  mitral valve regurgitation. Mild to moderate mitral stenosis. Moderate to  severe mitral  annular calcification.  6. Tricuspid valve regurgitation is moderate to severe.  7. 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 Aortic  valve gradients are moderately elevated, unchanged from 2019. There is a  fixed, calcified density in the LVOT unchanged from 2019. The aortic valve  has been repaired/replaced.  Aortic valve regurgitation is not visualized. Moderate aortic  valve  stenosis. 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  Aortic valve mean gradient measures 28.0 mmHg. Aortic valve Vmax measures  3.52 m/s.   Assessment and Plan:  1.  History of aortic stenosis status post bioprosthetic AVR in 2014.  Echocardiogram from December 2021 noted above.  He does have increased valve gradients, mean gradient 28 mmHg although leaflet motion normal and could be function of patient-prosthesis mismatch.  No definite stenosis.  2.  CAD status post SVG to OM in 2014.  No active angina symptoms. Continue aspirin and statin.  3.  Mixed hyperlipidemia, on Zocor.  Requesting interval lab work from Austin [name] [name].  4.  History of subsegmental pulmonary emboli diagnosed in December 2021 as noted above.  He will continue Eliquis through June and then discontinue.  5.  ESRD on peritoneal dialysis.  Medication Adjustments/Labs and Tests Ordered: Current medicines are reviewed at length with the patient today.  Concerns regarding medicines are outlined above.   Tests Ordered: No orders of the defined types were placed in this encounter.   Medication Changes: Meds ordered this encounter  Medications  . apixaban (ELIQUIS) 5 MG TABS tablet    Sig: Take 1 tablet (5 mg total) by mouth 2 (two) times daily. STOP Eliquis the END OF June 2022    Dispense:  60 tablet    Refill:  3    Disposition:  Follow up 6 months.  Signed, Satira Sark, MD, [facility] 10/24/2020 1:22 PM    [facility] [address] [place], [place]  [address] Phone: [phone]; Fax: [phone]

## 2020-11-14 DIAGNOSIS — F411 Generalized anxiety disorder: Secondary | ICD-10-CM | POA: Insufficient documentation

## 2020-12-11 ENCOUNTER — Other Ambulatory Visit: Payer: Self-pay

## 2020-12-11 ENCOUNTER — Encounter (HOSPITAL_COMMUNITY): Payer: Self-pay | Admitting: Physical Therapy

## 2020-12-11 ENCOUNTER — Ambulatory Visit (HOSPITAL_COMMUNITY): Payer: Managed Care, Other (non HMO) | Attending: Nephrology | Admitting: Physical Therapy

## 2020-12-11 DIAGNOSIS — R2689 Other abnormalities of gait and mobility: Secondary | ICD-10-CM | POA: Diagnosis present

## 2020-12-11 DIAGNOSIS — R29898 Other symptoms and signs involving the musculoskeletal system: Secondary | ICD-10-CM | POA: Insufficient documentation

## 2020-12-11 DIAGNOSIS — M6281 Muscle weakness (generalized): Secondary | ICD-10-CM | POA: Diagnosis not present

## 2020-12-11 NOTE — Therapy (Signed)
[name], [place], [address] Phone: [phone]   Fax:  [phone]  Physical Therapy Evaluation  Patient Details  Name: [name] MRN: [id number] Date of Birth: 1945-12-30 Referring Provider (PT): Lavonia Dana MD   Encounter Date: 12/11/2020   PT End of Session - 12/11/20 1209     Visit Number 1    Number of Visits 12   1-2x/week for 6 weeks   Date for PT Re-Evaluation 01/22/21    Authorization Type Primary [employer] (no vl, no auth) Secondary Medicare    Progress Note Due on Visit 10    PT Start Time 1130    PT Stop Time 1208    PT Time Calculation (min) 38 min    Equipment Utilized During Treatment Gait belt    Activity Tolerance Patient tolerated treatment well;Patient limited by fatigue    Behavior During Therapy [facility] for tasks assessed/performed             Past Medical History:  Diagnosis Date   Aortic stenosis    Status post AVR, 25 mm Edwards pericardial Magna-Ease valve 2014   Arthritis    CAD (coronary artery disease)    Status post SVG to OM 2014   COPD (chronic obstructive pulmonary disease) ([place])    Depression    ESRD on peritoneal dialysis ([place])    Essential hypertension    Gout    Hemorrhoid    Hypothyroidism    Kidney stones    Mixed hyperlipidemia    Orthostatic hypotension    Pulmonary emboli ([place]) 08/21/2020   Syncope     Past Surgical History:  Procedure Laterality Date   ANGIOPASTY     AORTIC VALVE REPLACEMENT  07/11/2012   Procedure: AORTIC VALVE REPLACEMENT (AVR);  Surgeon: Gaye Pollack, MD;  Location: [place];  Service: Open Heart Surgery;  Laterality: N/A;   AV FISTULA PLACEMENT Left 05/30/2020   Procedure: ARTERIOVENOUS (AV) FISTULA CREATION LEFT;  Surgeon: [name], MD;  [facility];  Service: Vascular;  Laterality: Left;   CARDIAC CATHETERIZATION     CAROTID ENDARTERECTOMY     COLONOSCOPY WITH PROPOFOL N/A 07/16/2020   Procedure: COLONOSCOPY WITH  PROPOFOL;  Surgeon: Doran Stabler, MD;  Location: [place];  Service: Gastroenterology;  Laterality: N/A;   CORONARY ARTERY BYPASS GRAFT  07/11/2012   Procedure: CORONARY ARTERY BYPASS GRAFTING (CABG);  Surgeon: Gaye Pollack, MD;  Location: [place];  Service: Open Heart Surgery;  Laterality: N/A;  CABG x one,  using right leg greater saphenous vein harvested endoscopically   ESOPHAGOGASTRODUODENOSCOPY (EGD) WITH PROPOFOL N/A 07/15/2020   Procedure: ESOPHAGOGASTRODUODENOSCOPY (EGD) WITH PROPOFOL;  Surgeon: Doran Stabler, MD;  Location: [place];  Service: Gastroenterology;  Laterality: N/A;   HOT HEMOSTASIS N/A 07/16/2020   Procedure: HOT HEMOSTASIS (ARGON PLASMA COAGULATION/BICAP);  Surgeon: Doran Stabler, MD;  Location: [place];  Service: Gastroenterology;  Laterality: N/A;   INTRAOPERATIVE TRANSESOPHAGEAL ECHOCARDIOGRAM  07/11/2012   Procedure: INTRAOPERATIVE TRANSESOPHAGEAL ECHOCARDIOGRAM;  Surgeon: Gaye Pollack, MD;  Location: [facility];  Service: Open Heart Surgery;  Laterality: N/A;   IR PERC TUN PERIT CATH WO PORT S&I /IMAG  06/13/2020   IR REMOVAL TUN CV CATH W/O FL  07/26/2020   IR US GUIDE VASC ACCESS RIGHT  06/13/2020   JOINT REPLACEMENT     TEE WITHOUT CARDIOVERSION N/A 06/12/2020   Procedure: TRANSESOPHAGEAL ECHOCARDIOGRAM (TEE);  Surgeon: Skeet Latch, MD;  Location: [facility];  Service: Cardiovascular;  Laterality: N/A;    There were no vitals filed for this visit.    Subjective Assessment - 12/11/20 1129     Subjective Patient is a 75 y.o. male who presents to physical therapy with referral for ESRD with c/o impaired balance and strength. He has fallen about 10-12 times over last few months. His balance is getting worse. He notes kidney trouble since December 2021 and he has been down hill since. He was in the hospital and everything has gotten worse. They told him not to lift more than 10 lbs. He does home dialysis. His main goal is to improve  balance, muscle tone, activity tolerance, and motivation. Has been using SPC and rollator lately.    Pertinent History ESRD, dialysis    Limitations Standing;Walking;House hold activities    How long can you walk comfortably? 10  minutes    Patient Stated Goals improve balance, muscle tone, activity tolerance, and motivation    Currently in Pain? No/denies                [facility] PT Assessment - 12/11/20 0001       Assessment   Medical Diagnosis ESRD, weakness, balance    Referring Provider (PT) Lavonia Dana MD    Onset Date/Surgical Date 05/29/20    Prior Therapy none      Precautions   Precautions Fall      Restrictions   Weight Bearing Restrictions No      Balance Screen   Has the patient fallen in the past 6 months Yes    How many times? 10-12    Has the patient had a decrease in activity level because of a fear of falling?  No    Is the patient reluctant to leave their home because of a fear of falling?  No      Prior Function   Level of Independence Independent    Vocation Retired      Charity fundraiser Status Within Functional Limits for tasks assessed      Observation/Other Assessments   Observations Ambulates without AD    Focus on Therapeutic Outcomes (FOTO)  n/a      ROM / Strength   AROM / PROM / Strength AROM;Strength      Strength   Strength Assessment Site Hip;Knee;Ankle    Right/Left Hip Right;Left    Right Hip Flexion 4-/5    Left Hip Flexion 4-/5    Right/Left Knee Right;Left    Right Knee Flexion 4-/5    Right Knee Extension 4/5    Left Knee Flexion 4-/5    Left Knee Extension 4-/5    Right/Left Ankle Right;Left    Right Ankle Dorsiflexion 5/5    Left Ankle Dorsiflexion 4/5      Transfers   Five time sit to stand comments  13.52 seconds without use of hands      Ambulation/Gait   Ambulation/Gait Yes    Ambulation Distance (Feet) 330 Feet    Assistive device None    Gait Pattern Poor foot clearance - left;Poor foot  clearance - right    Ambulation Surface Level;Indoor    Gait Comments 2MWT      Standardized Balance Assessment   Standardized Balance Assessment Dynamic Gait Index      Dynamic Gait Index   Level Surface Mild Impairment    Change in Gait Speed Mild Impairment    Gait with Horizontal Head Turns Moderate Impairment  Gait with Vertical Head Turns Moderate Impairment    Gait and Pivot Turn Mild Impairment    Step Over Obstacle Moderate Impairment    Step Around Obstacles Mild Impairment    Steps Moderate Impairment    Total Score 12    DGI comment: increased risk for fall                        Objective measurements completed on examination: See above findings.       [facility] Adult PT Treatment/Exercise - 12/11/20 0001       Exercises   Exercises Knee/Hip      Knee/Hip Exercises: Seated   Long Arc Quad 1 set;10 reps    Long Arc Quad Limitations 5 second holds    Other Seated Knee/Hip Exercises marching 10x 5 second holds    Sit to [employer]                    PT Education - 12/11/20 1129     Education Details Patient educaed on exam findings, POC, scope of PT, HEP    Person(s) Educated Patient    Methods Explanation;Demonstration;Handout    Comprehension Verbalized understanding;Returned demonstration              PT Short Term Goals - 12/11/20 1214       PT SHORT TERM GOAL #1   Title Patient will be independent with HEP in order to improve functional outcomes.    Time 3    Period Weeks    Status New    Target Date 01/01/21      PT SHORT TERM GOAL #2   Title Patient will report at least 25% improvement in symptoms for improved quality of life.    Time 3    Period Weeks    Status New    Target Date 01/01/21               PT Long Term Goals - 12/11/20 1214       PT LONG TERM GOAL #1   Title Patient will report at least 75% improvement in symptoms for improved quality of life.    Time 6    Period Weeks    Status  New    Target Date 01/22/21      PT LONG TERM GOAL #2   Title Patient will be able to complete 5x STS in under 11.4 seconds in order to reduce the risk of falls.    Time 6    Period Weeks    Status New    Target Date 01/22/21      PT LONG TERM GOAL #3   Title Patient will be able to ambulate at least 400 feet in 2MWT in order to demonstrate improved gait speed for community ambulation.    Time 6    Period Weeks    Status New    Target Date 01/22/21      PT LONG TERM GOAL #4   Title Patient will score at least 19/24 on DGI to indicate improved balance to reduce risk for falling.    Time 6    Period Weeks    Status New    Target Date 01/22/21                    Plan - 12/11/20 1210     Clinical Impression Statement Patient is a 75 y.o. male who presents to physical therapy  with referral for ESRD with c/o impaired balance and strength. He presents with deficits in LE strength, gait, balance, endurance, functional mobility with ADL. He is having to modify and restrict ADL as indicated by DGI score, 5xSTS, 2MWT, as well as subjective information and objective measures which is affecting overall participation. Patient will benefit from skilled physical therapy in order to improve function and reduce impairment.    Personal Factors and Comorbidities Age;Fitness;Behavior Pattern;Past/Current Experience;Comorbidity 3+;Time since onset of injury/illness/exacerbation    Comorbidities ESRD, dialysis, COPD    Examination-Activity Limitations Locomotion Level;Transfers;Stand;Stairs;Squat;Lift;Hygiene/Grooming    Examination-Participation Restrictions Meal Prep;Cleaning;Community Activity;Shop;Volunteer;Dorita Sciara    Stability/Clinical Decision Making Stable/Uncomplicated    Clinical Decision Making Low    Rehab Potential Good    PT Frequency Other (comment)   1-2x/week   PT Duration 6 weeks    PT Treatment/Interventions ADLs/Self Care Home Management;Aquatic  Therapy;Cryotherapy;Electrical Stimulation;Iontophoresis 4mg /ml Dexamethasone;Moist Heat;Traction;Ultrasound;Parrafin;Fluidtherapy;Contrast Bath;DME Instruction;Gait training;Stair training;Therapeutic exercise;Functional mobility training;Therapeutic activities;Canalith Repostioning;Balance training;Neuromuscular re-education;Patient/family education;Orthotic Fit/Training;Manual techniques;Compression bandaging;Dry needling;Energy conservation;Splinting;Taping    PT Next Visit Plan begin functional strengthening, balance training, and UE strengthening and progress as tolerated    PT Home Exercise Plan 6/15 marching, LAQ, STS    Consulted and Agree with Plan of Care Patient             Patient will benefit from skilled therapeutic intervention in order to improve the following deficits and impairments:  Abnormal gait, Decreased endurance, Decreased activity tolerance, Decreased balance, Improper body mechanics, Decreased strength, Decreased mobility  Visit Diagnosis: Muscle weakness (generalized)  Other abnormalities of gait and mobility  Other symptoms and signs involving the musculoskeletal system     Problem List Patient Active Problem List   Diagnosis Date Noted   Pulmonary emboli ([place]) 08/21/2020   Acute GI bleeding 07/14/2020   Symptomatic anemia    Hypokalemia 07/10/2020   Endocarditis    Elevated d-dimer    Acute on chronic congestive heart failure (HCC)    Demand ischemia (HCC)    Severe sepsis without septic shock (CODE) (HCC)    Enterococcal bacteremia    S/P AVR (aortic valve replacement)    Fever 06/08/2020   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   Essential (primary) hypertension    Chronic obstructive pulmonary disease, unspecified (HCC)    Hyperlipidemia, unspecified    Hypothyroidism, unspecified    Atherosclerotic heart disease of native coronary artery without angina pectoris    Gout, unspecified    Major depressive disorder,  single episode, unspecified    Morbid (severe) obesity due to excess calories (HCC)    Nonrheumatic aortic (valve) stenosis    Orthostatic hypotension    Hypertension    Thyroid disease    Arthritis    COPD (chronic obstructive pulmonary disease) (HCC)    Heart murmur    Aortic stenosis    CAD (coronary artery disease)    Syncope    Depression    Kidney stones    12:17 PM, 12/11/20 Mearl Latin PT, DPT Physical Therapist at [place] [address] [place], [place], [address] Phone: [phone]   Fax:  [phone]  Name: MAGNUS CRESCENZO MRN: [id number] Date of Birth: Dec 08, 1945

## 2020-12-11 NOTE — Patient Instructions (Signed)
Access Code: [email or web address] URL: [email or web address] Date: 12/11/2020 Prepared by: [facility] [name]  Exercises Seated Long Arc Quad - 1-2 x daily - 7 x weekly - 1 sets - 10 reps - 5 second hold Seated March - 1-2 x daily - 7 x weekly - 1 sets - 10 reps - 5 second hold Sit to Stand with Arms Crossed - 1-2 x daily - 7 x weekly - 1 sets - 10 reps

## 2020-12-16 ENCOUNTER — Ambulatory Visit (HOSPITAL_COMMUNITY): Payer: Managed Care, Other (non HMO)

## 2020-12-16 ENCOUNTER — Telehealth (HOSPITAL_COMMUNITY): Payer: Self-pay

## 2020-12-16 NOTE — Telephone Encounter (Signed)
Called left message regarding missed appointment and notified of next appointment time/date.  10:06 AM, 12/16/20 M. Sherlyn Lees, PT, DPT Physical Therapist- [place] Office Number: [phone]

## 2020-12-24 ENCOUNTER — Other Ambulatory Visit: Payer: Self-pay

## 2020-12-24 ENCOUNTER — Encounter (HOSPITAL_COMMUNITY): Payer: Self-pay | Admitting: Physical Therapy

## 2020-12-24 ENCOUNTER — Ambulatory Visit (HOSPITAL_COMMUNITY): Payer: Managed Care, Other (non HMO) | Admitting: Physical Therapy

## 2020-12-24 DIAGNOSIS — M6281 Muscle weakness (generalized): Secondary | ICD-10-CM | POA: Diagnosis not present

## 2020-12-24 DIAGNOSIS — R2689 Other abnormalities of gait and mobility: Secondary | ICD-10-CM

## 2020-12-24 DIAGNOSIS — R29898 Other symptoms and signs involving the musculoskeletal system: Secondary | ICD-10-CM

## 2020-12-24 NOTE — Therapy (Signed)
[facility] [address] [place], [place], [address] Phone: [phone]   Fax:  [phone]  Physical Therapy Treatment  Patient Details  Name: [name] MRN: [id number] Date of Birth: 05-29-1946 Referring Provider (PT): Lavonia Dana MD   Encounter Date: 12/24/2020   PT End of Session - 12/24/20 1404     Visit Number 2    Number of Visits 12   1-2x/week for 6 weeks   Date for PT Re-Evaluation 01/22/21    Authorization Type Primary [employer] (no vl, no auth) Secondary Medicare    Progress Note Due on Visit 10    PT Start Time 3295    PT Stop Time 1884    PT Time Calculation (min) 38 min    Equipment Utilized During Treatment Gait belt    Activity Tolerance Patient tolerated treatment well;Patient limited by fatigue    Behavior During Therapy [facility] for tasks assessed/performed             Past Medical History:  Diagnosis Date   Aortic stenosis    Status post AVR, 25 mm Edwards pericardial Magna-Ease valve 2014   Arthritis    CAD (coronary artery disease)    Status post SVG to OM 2014   COPD (chronic obstructive pulmonary disease) ([place])    Depression    ESRD on peritoneal dialysis ([place])    Essential hypertension    Gout    Hemorrhoid    Hypothyroidism    Kidney stones    Mixed hyperlipidemia    Orthostatic hypotension    Pulmonary emboli ([place]) 08/21/2020   Syncope     Past Surgical History:  Procedure Laterality Date   ANGIOPASTY     AORTIC VALVE REPLACEMENT  07/11/2012   Procedure: AORTIC VALVE REPLACEMENT (AVR);  Surgeon: Gaye Pollack, MD;  Location: [place];  Service: Open Heart Surgery;  Laterality: N/A;   AV FISTULA PLACEMENT Left 05/30/2020   Procedure: ARTERIOVENOUS (AV) FISTULA CREATION LEFT;  Surgeon: [name], MD;  [facility]: [facility];  Service: Vascular;  Laterality: Left;   CARDIAC CATHETERIZATION     CAROTID ENDARTERECTOMY     COLONOSCOPY WITH PROPOFOL N/A 07/16/2020   Procedure: COLONOSCOPY WITH  PROPOFOL;  Surgeon: Doran Stabler, MD;  Location: [place];  Service: Gastroenterology;  Laterality: N/A;   CORONARY ARTERY BYPASS GRAFT  07/11/2012   Procedure: CORONARY ARTERY BYPASS GRAFTING (CABG);  Surgeon: Gaye Pollack, MD;  Location: [place];  Service: Open Heart Surgery;  Laterality: N/A;  CABG x one,  using right leg greater saphenous vein harvested endoscopically   ESOPHAGOGASTRODUODENOSCOPY (EGD) WITH PROPOFOL N/A 07/15/2020   Procedure: ESOPHAGOGASTRODUODENOSCOPY (EGD) WITH PROPOFOL;  Surgeon: Doran Stabler, MD;  Location: [place];  Service: Gastroenterology;  Laterality: N/A;   HOT HEMOSTASIS N/A 07/16/2020   Procedure: HOT HEMOSTASIS (ARGON PLASMA COAGULATION/BICAP);  Surgeon: Doran Stabler, MD;  Location: [place];  Service: Gastroenterology;  Laterality: N/A;   INTRAOPERATIVE TRANSESOPHAGEAL ECHOCARDIOGRAM  07/11/2012   Procedure: INTRAOPERATIVE TRANSESOPHAGEAL ECHOCARDIOGRAM;  Surgeon: Gaye Pollack, MD;  Location[facility];  Service: Open Heart Surgery;  Laterality: N/A;   IR PERC TUN PERIT CATH WO PORT S&I /IMAG  06/13/2020   IR REMOVAL TUN CV CATH W/O FL  07/26/2020   IR US GUIDE VASC ACCESS RIGHT  06/13/2020   JOINT REPLACEMENT     TEE WITHOUT CARDIOVERSION N/A 06/12/2020   Procedure: TRANSESOPHAGEAL ECHOCARDIOGRAM (TEE);  Surgeon: Skeet Latch, MD;  [facility];  Service: Cardiovascular;  Laterality: N/A;    There were no vitals filed for this visit.   Subjective Assessment - 12/24/20 1407     Subjective States he has been doing his exercises and trying to do them every other day because he doesn't have the energy to do things. States he is eating regularly.    Pertinent History ESRD, dialysis    Limitations Standing;Walking;House hold activities    How long can you walk comfortably? 10  minutes    Patient Stated Goals improve balance, muscle tone, activity tolerance, and motivation    Currently in Pain? No/denies                 [facility] PT Assessment - 12/24/20 0001       Assessment   Medical Diagnosis ESRD, weakness, balance    Referring Provider (PT) Lavonia Dana MD    Onset Date/Surgical Date 05/29/20                           [facility] Adult PT Treatment/Exercise - 12/24/20 0001       Knee/Hip Exercises: Standing   Hip Abduction AROM;Stengthening;Both;4 sets;5 reps;Knee straight   with hands on counter   Hip Extension AROM;Both;4 sets;5 reps;Knee bent   UE support   SLS 3x5 10" holds 1 hand support - bilateral    Other Standing Knee Exercises lateral stepping at counter 4x5      Knee/Hip Exercises: Seated   Long Arc Quad Both;Strengthening;15 reps;2 sets    Other Seated Knee/Hip Exercises marching 10x2 5 second holds    Sit to Sand 10 reps;without UE support;2 sets                      PT Short Term Goals - 12/11/20 1214       PT SHORT TERM GOAL #1   Title Patient will be independent with HEP in order to improve functional outcomes.    Time 3    Period Weeks    Status New    Target Date 01/01/21      PT SHORT TERM GOAL #2   Title Patient will report at least 25% improvement in symptoms for improved quality of life.    Time 3    Period Weeks    Status New    Target Date 01/01/21               PT Long Term Goals - 12/11/20 1214       PT LONG TERM GOAL #1   Title Patient will report at least 75% improvement in symptoms for improved quality of life.    Time 6    Period Weeks    Status New    Target Date 01/22/21      PT LONG TERM GOAL #2   Title Patient will be able to complete 5x STS in under 11.4 seconds in order to reduce the risk of falls.    Time 6    Period Weeks    Status New    Target Date 01/22/21      PT LONG TERM GOAL #3   Title Patient will be able to ambulate at least 400 feet in 2MWT in order to demonstrate improved gait speed for community ambulation.    Time 6    Period Weeks    Status New    Target Date 01/22/21       PT  LONG TERM GOAL #4   Title Patient will score at least 19/24 on DGI to indicate improved balance to reduce risk for falling.    Time 6    Period Weeks    Status New    Target Date 01/22/21                   Plan - 12/24/20 1406     Clinical Impression Statement Able to progress HEP as tolerated but patient did fatigue quickly. Cued patient to breath during exercises and to take longer rest break between sets to allow for respiratory rate to return to baseline. Printed off exercises for improved HEP adherence. Will continued to advance HEP and POC as tolerated.    Personal Factors and Comorbidities Age;Fitness;Behavior Pattern;Past/Current Experience;Comorbidity 3+;Time since onset of injury/illness/exacerbation    Comorbidities ESRD, dialysis, COPD    Examination-Activity Limitations Locomotion Level;Transfers;Stand;Stairs;Squat;Lift;Hygiene/Grooming    Examination-Participation Restrictions Meal Prep;Cleaning;Community Activity;Shop;Volunteer;[facility]alla Leaver [facility]    Stability/Clinical Decision Making Stable/Uncomplicated    Rehab Potential Good    PT Frequency Other (comment)   1-2x/week   PT Duration 6 weeks    PT Treatment/Interventions ADLs/Self Care Home Management;Aquatic Therapy;Cryotherapy;Electrical Stimulation;Iontophoresis 4mg /ml Dexamethasone;Moist Heat;Traction;Ultrasound;Parrafin;Fluidtherapy;Contrast Bath;DME Instruction;Gait training;Stair training;Therapeutic exercise;Functional mobility training;Therapeutic activities;Canalith Repostioning;Balance training;Neuromuscular re-education;Patient/family education;Orthotic Fit/Training;Manual techniques;Compression bandaging;Dry needling;Energy conservation;Splinting;Taping    PT Next Visit Plan begin functional strengthening, balance training, and UE strengthening and progress as tolerated    PT Home Exercise Plan 6/15 marching, LAQ, STS; 6/28 hip extension, hip abd, side stepping    Consulted and Agree with Plan of Care  Patient             Patient will benefit from skilled therapeutic intervention in order to improve the following deficits and impairments:  Abnormal gait, Decreased endurance, Decreased activity tolerance, Decreased balance, Improper body mechanics, Decreased strength, Decreased mobility  Visit Diagnosis: Muscle weakness (generalized)  Other abnormalities of gait and mobility  Other symptoms and signs involving the musculoskeletal system     Problem List Patient Active Problem List   Diagnosis Date Noted   Pulmonary emboli ([place]) 08/21/2020   Acute GI bleeding 07/14/2020   Symptomatic anemia    Hypokalemia 07/10/2020   Endocarditis    Elevated d-dimer    Acute on chronic congestive heart failure (HCC)    Demand ischemia (HCC)    Severe sepsis without septic shock (CODE) (HCC)    Enterococcal bacteremia    S/P AVR (aortic valve replacement)    Fever 06/08/2020   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   Essential (primary) hypertension    Chronic obstructive pulmonary disease, unspecified (HCC)    Hyperlipidemia, unspecified    Hypothyroidism, unspecified    Atherosclerotic heart disease of native coronary artery without angina pectoris    Gout, unspecified    Major depressive disorder, single episode, unspecified    Morbid (severe) obesity due to excess calories (HCC)    Nonrheumatic aortic (valve) stenosis    Orthostatic hypotension    Hypertension    Thyroid disease    Arthritis    COPD (chronic obstructive pulmonary disease) (HCC)    Heart murmur    Aortic stenosis    CAD (coronary artery disease)    Syncope    Depression    Kidney stones   2:51 PM, 12/24/20 Jerene Pitch, DPT Physical Therapy with [facility]  [phone] office   [place] [address] [place], [place], [address] Phone: [phone]   Fax:  [phone]  Name: PAXTON BINNS MRN:  [id number] Date of Birth: [birth year]

## 2020-12-26 ENCOUNTER — Telehealth (HOSPITAL_COMMUNITY): Payer: Self-pay | Admitting: Physical Therapy

## 2020-12-26 NOTE — Telephone Encounter (Signed)
Pt has a Dialysis apptment on this date with MD and will not be here. They did not want to r/s - he will return on 01/08/21.

## 2021-01-01 ENCOUNTER — Ambulatory Visit (HOSPITAL_COMMUNITY): Payer: Managed Care, Other (non HMO) | Admitting: Physical Therapy

## 2021-01-08 ENCOUNTER — Encounter (HOSPITAL_COMMUNITY): Payer: Managed Care, Other (non HMO) | Admitting: Physical Therapy

## 2021-01-09 ENCOUNTER — Telehealth (HOSPITAL_COMMUNITY): Payer: Self-pay | Admitting: Physical Therapy

## 2021-01-09 NOTE — Telephone Encounter (Signed)
Patient no show for appointment on 7/13, left message for patient making him aware of missed appointment and reminded him of next appointment.   1:37 PM, 01/09/21 Mearl Latin PT, DPT Physical Therapist at [facility]

## 2021-01-15 ENCOUNTER — Encounter (HOSPITAL_COMMUNITY): Payer: Managed Care, Other (non HMO) | Admitting: Physical Therapy

## 2021-01-20 ENCOUNTER — Other Ambulatory Visit: Payer: Self-pay

## 2021-01-20 ENCOUNTER — Encounter (HOSPITAL_COMMUNITY): Payer: Self-pay | Admitting: Physical Therapy

## 2021-01-20 ENCOUNTER — Ambulatory Visit (HOSPITAL_COMMUNITY): Payer: Managed Care, Other (non HMO) | Attending: Nephrology | Admitting: Physical Therapy

## 2021-01-20 DIAGNOSIS — R29898 Other symptoms and signs involving the musculoskeletal system: Secondary | ICD-10-CM | POA: Insufficient documentation

## 2021-01-20 DIAGNOSIS — R2689 Other abnormalities of gait and mobility: Secondary | ICD-10-CM | POA: Insufficient documentation

## 2021-01-20 DIAGNOSIS — M6281 Muscle weakness (generalized): Secondary | ICD-10-CM | POA: Diagnosis not present

## 2021-01-20 NOTE — Therapy (Signed)
[name] [address] [place], [place], [address] Phone: [phone]   Fax:  [phone]  Physical Therapy Treatment  and Discharge Note  Patient Details  Name: [name] MRN: [id number] Date of Birth: September 18, 1945 Referring Provider (PT): Lavonia Dana MD   PHYSICAL THERAPY DISCHARGE SUMMARY  Visits from Start of Care: 3  Current functional level related to goals / functional outcomes: 1/2 STG and 3/4 LTG met   Remaining deficits: See below   Education / Equipment: See below   Patient agrees to discharge. Patient goals were partially met. Patient is being discharged due to being pleased with the current functional level.    Encounter Date: 01/20/2021   PT End of Session - 01/20/21 0745     Visit Number 3    Number of Visits 12   1-2x/week for 6 weeks   Date for PT Re-Evaluation 01/22/21    Authorization Type Primary [employer] (no vl, no auth) Secondary Medicare    Progress Note Due on Visit 10    PT Start Time 0745    PT Stop Time 0824    PT Time Calculation (min) 39 min    Equipment Utilized During Treatment Gait belt    Activity Tolerance Patient tolerated treatment well;Patient limited by fatigue    Behavior During Therapy [facility] for tasks assessed/performed             Past Medical History:  Diagnosis Date   Aortic stenosis    Status post AVR, 25 mm Edwards pericardial Magna-Ease valve 2014   Arthritis    CAD (coronary artery disease)    Status post SVG to OM 2014   COPD (chronic obstructive pulmonary disease) ([place])    Depression    ESRD on peritoneal dialysis ([place])    Essential hypertension    Gout    Hemorrhoid    Hypothyroidism    Kidney stones    Mixed hyperlipidemia    Orthostatic hypotension    Pulmonary emboli ([place]) 08/21/2020   Syncope     Past Surgical History:  Procedure Laterality Date   ANGIOPASTY     AORTIC VALVE REPLACEMENT  07/11/2012   Procedure: AORTIC VALVE REPLACEMENT (AVR);   Surgeon: Gaye Pollack, MD;  Location: [place];  Service: Open Heart Surgery;  Laterality: N/A;   AV FISTULA PLACEMENT Left 05/30/2020   Procedure: ARTERIOVENOUS (AV) FISTULA CREATION LEFT;  Surgeon: [name], MD;  Location: [facility];  Service: Vascular;  Laterality: Left;   CARDIAC CATHETERIZATION     CAROTID ENDARTERECTOMY     COLONOSCOPY WITH PROPOFOL N/A 07/16/2020   Procedure: COLONOSCOPY WITH PROPOFOL;  Surgeon: Doran Stabler, MD;  Location: [place];  Service: Gastroenterology;  Laterality: N/A;   CORONARY ARTERY BYPASS GRAFT  07/11/2012   Procedure: CORONARY ARTERY BYPASS GRAFTING (CABG);  Surgeon: Gaye Pollack, MD;  Location: [place];  Service: Open Heart Surgery;  Laterality: N/A;  CABG x one,  using right leg greater saphenous vein harvested endoscopically   ESOPHAGOGASTRODUODENOSCOPY (EGD) WITH PROPOFOL N/A 07/15/2020   Procedure: ESOPHAGOGASTRODUODENOSCOPY (EGD) WITH PROPOFOL;  Surgeon: Doran Stabler, MD;  Location: [name];  Service: Gastroenterology;  Laterality: N/A;   HOT HEMOSTASIS N/A 07/16/2020   Procedure: HOT HEMOSTASIS (ARGON PLASMA COAGULATION/BICAP);  Surgeon: Doran Stabler, MD;  Location: [place];  Service: Gastroenterology;  Laterality: N/A;   INTRAOPERATIVE TRANSESOPHAGEAL ECHOCARDIOGRAM  07/11/2012   Procedure: INTRAOPERATIVE TRANSESOPHAGEAL ECHOCARDIOGRAM;  Surgeon: Gaye Pollack, MD;  [facility];  Service: Open Heart Surgery;  Laterality: N/A;   IR PERC TUN PERIT CATH WO PORT S&I /IMAG  06/13/2020   IR REMOVAL TUN CV CATH W/O FL  07/26/2020   IR US GUIDE VASC ACCESS RIGHT  06/13/2020   JOINT REPLACEMENT     TEE WITHOUT CARDIOVERSION N/A 06/12/2020   Procedure: TRANSESOPHAGEAL ECHOCARDIOGRAM (TEE);  Surgeon: Skeet Latch, MD;  Location: [place];  Service: Cardiovascular;  Laterality: N/A;    There were no vitals filed for this visit.   Subjective Assessment - 01/20/21 0750     Subjective States he has been up since 3am  and couldn't go back to sleep. State's he tries to do some of his exercises everyday. States he doesn't quite have the incentive when he is up here. States that he is not in pain but his stomach is rolling. States that he is 60-75% better. States that he is still having problems with his balance when he has any kind of uneven surfaces.    Pertinent History ESRD, dialysis    Limitations Standing;Walking;House hold activities    How long can you walk comfortably? 10  minutes    Patient Stated Goals improve balance, muscle tone, activity tolerance, and motivation    Currently in Pain? No/denies                [facility] PT Assessment - 01/20/21 0001       Assessment   Medical Diagnosis ESRD, weakness, balance    Referring Provider (PT) Lavonia Dana MD    Onset Date/Surgical Date 05/29/20      Transfers   Five time sit to stand comments  9.03 seconds without use of hands      Ambulation/Gait   Ambulation/Gait Yes    Ambulation Distance (Feet) 452 Feet    Assistive device None    Ambulation Surface Level;Indoor    Gait Comments 2MWT      Standardized Balance Assessment   Standardized Balance Assessment Dynamic Gait Index      Dynamic Gait Index   Level Surface Normal    Change in Gait Speed Normal    Gait with Horizontal Head Turns Normal   reports off feeling   Gait with Vertical Head Turns Normal   reports off feeling   Gait and Pivot Turn Normal    Step Over Obstacle Normal    Step Around Obstacles Normal    Steps Mild Impairment   reports off feeling   Total Score 23                           [facility] Adult PT Treatment/Exercise - 01/20/21 0001       Knee/Hip Exercises: Aerobic   Stationary Bike 4 minutes at end to introduce to machinge at [employer]      Knee/Hip Exercises: Standing   Other Standing Knee Exercises stepping near counter head nods x10, walking near counter head rotation x10 each    Other Standing Knee Exercises head nod 2x15. head rotation  2x15 bilateral                    PT Education - 01/20/21 0759     Education Details on current presentation, HEP and plan moving forward, on [employer] and different machines (aerobic machines) to use at [employer] as well as walking at [employer]) Educated Patient    Methods Explanation    Comprehension Verbalized  understanding              PT Short Term Goals - 01/20/21 0749       PT SHORT TERM GOAL #1   Title Patient will be independent with HEP in order to improve functional outcomes.    Time 3    Period Weeks    Status On-going    Target Date 01/01/21      PT SHORT TERM GOAL #2   Title Patient will report at least 25% improvement in symptoms for improved quality of life.    Baseline 60-70% better    Time 3    Period Weeks    Status Achieved    Target Date 01/01/21               PT Long Term Goals - 01/20/21 0751       PT LONG TERM GOAL #1   Title Patient will report at least 75% improvement in symptoms for improved quality of life.    Baseline 60-70% better    Time 6    Period Weeks    Status On-going      PT LONG TERM GOAL #2   Title Patient will be able to complete 5x STS in under 11.4 seconds in order to reduce the risk of falls.    Baseline 9.04 seconds no hands    Time 6    Period Weeks    Status Achieved      PT LONG TERM GOAL #3   Title Patient will be able to ambulate at least 400 feet in 2MWT in order to demonstrate improved gait speed for community ambulation.    Baseline 452 feet    Time 6    Period Weeks    Status Achieved      PT LONG TERM GOAL #4   Title Patient will score at least 19/24 on DGI to indicate improved balance to reduce risk for falling.    Time 6    Period Weeks    Status Achieved                   Plan - 01/20/21 0759     Clinical Impression Statement Overall patient is progressing well. Patient has met  short term goals and  long term goals. Patient has met all functional and  balance goals at this time. Reviewed HEP and added additional HEP to continue to work on balance. Answered all questions. Patient to discharge from PT to HEP at this time.    Personal Factors and Comorbidities Age;Fitness;Behavior Pattern;Past/Current Experience;Comorbidity 3+;Time since onset of injury/illness/exacerbation    Comorbidities ESRD, dialysis, COPD    Examination-Activity Limitations Locomotion Level;Transfers;Stand;Stairs;Squat;Lift;Hygiene/Grooming    Examination-Participation Restrictions Meal Prep;Cleaning;Community Activity;Shop;Volunteer;[facility]alla Leaver [facility]    Stability/Clinical Decision Making Stable/Uncomplicated    Rehab Potential Good    PT Frequency Other (comment)   1-2x/week   PT Duration 6 weeks    PT Treatment/Interventions ADLs/Self Care Home Management;Aquatic Therapy;Cryotherapy;Electrical Stimulation;Iontophoresis 42m/ml Dexamethasone;Moist Heat;Traction;Ultrasound;Parrafin;Fluidtherapy;Contrast Bath;DME Instruction;Gait training;Stair training;Therapeutic exercise;Functional mobility training;Therapeutic activities;Canalith Repostioning;Balance training;Neuromuscular re-education;Patient/family education;Orthotic Fit/Training;Manual techniques;Compression bandaging;Dry needling;Energy conservation;Splinting;Taping    PT Next Visit Plan DC to HEP    PT Home Exercise Plan 6/15 marching, LAQ, STS; 6/28 hip extension, hip abd, side stepping    Consulted and Agree with Plan of Care Patient             Patient will benefit from skilled therapeutic intervention in order to improve the following deficits and impairments:  Abnormal gait,  Decreased endurance, Decreased activity tolerance, Decreased balance, Improper body mechanics, Decreased strength, Decreased mobility  Visit Diagnosis: Muscle weakness (generalized)  Other abnormalities of gait and mobility  Other symptoms and signs involving the musculoskeletal system     Problem List Patient Active Problem  List   Diagnosis Date Noted   Pulmonary emboli ([place]) 08/21/2020   Acute GI bleeding 07/14/2020   Symptomatic anemia    Hypokalemia 07/10/2020   Endocarditis    Elevated d-dimer    Acute on chronic congestive heart failure (HCC)    Demand ischemia (HCC)    Severe sepsis without septic shock (CODE) (HCC)    Enterococcal bacteremia    S/P AVR (aortic valve replacement)    Fever 06/08/2020   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   Essential (primary) hypertension    Chronic obstructive pulmonary disease, unspecified (HCC)    Hyperlipidemia, unspecified    Hypothyroidism, unspecified    Atherosclerotic heart disease of native coronary artery without angina pectoris    Gout, unspecified    Major depressive disorder, single episode, unspecified    Morbid (severe) obesity due to excess calories (HCC)    Nonrheumatic aortic (valve) stenosis    Orthostatic hypotension    Hypertension    Thyroid disease    Arthritis    COPD (chronic obstructive pulmonary disease) (HCC)    Heart murmur    Aortic stenosis    CAD (coronary artery disease)    Syncope    Depression    Kidney stones     8:25 AM, 01/20/21 Jerene Pitch, DPT Physical Therapy with [facility]  [phone] office   [place], [place], [address] Phone: [phone]   Fax:  [phone]  Name: [name] MRN: [id number] Date of Birth: [birth year]

## 2021-04-01 NOTE — Progress Notes (Signed)
Cardiology Office Note  Date: 04/03/2021   ID: Austin, Nelson 21-Jun-1946, MRN [id number]  PCP:  Celene Squibb, MD  Cardiologist:  Rozann Lesches, MD Electrophysiologist:  None   Chief Complaint: 64-month follow-up low heart rate 35 to 41 bpm  History of Present Illness: [name] is a 75 y.o. male with a history of aortic stenosis, CAD/CABG, ESRD on PD, HTN, hypothyroidism, HLD, PE, syncope.  He was last seen by Dr. Domenic Polite on 10/24/2020.  He had been doing well.  Had no anginal symptoms or worsening shortness of breath beyond NYHA class II.  He continued on peritoneal dialysis at home.  His medications were reviewed.  He had a previous diagnosis of subsegmental PE by VQ scan in December 2021.  Most recent lab work was requested from Dr. Nevada Crane.  He had no intolerances to statin therapy.  Echocardiogram and TEE results in December were reviewed by Dr. Domenic Polite.  Dr. Domenic Polite noted a history of aortic stenosis status post bioprosthetic AVR in 2014.  Echocardiogram from December 2021 noted he did have increased valve gradients with mean gradient of 6mmHg although leaflet motion normal and could be functional patient/prosthesis mismatch.  No definite stenosis.  Status post SVG to OM 2014.  No active anginal symptoms.  He was continuing aspirin and statin.  He was continuing on Zocor and lab work from PCP was requested.  Continuing PD for ESRD.  He is here for 43-month follow-up today and recheck of his slow heart rate.  His metoprolol has been decreased to once a day 25 mg and heart rate has increased appropriately.  Heart rate today is 64.  Blood pressure 126/64.  He states he feels tired at times but he attributes it to his multiple comorbidities including COPD, ESRD, CAD.  He continues with PD.  He has left upper extremity AV fistula in anticipation HD in the future.  He has chronic DOE which he attributes to his COPD.  States he is in the process of obtaining a mobility device.  Denies any  current anginal symptoms.     Past Medical History:  Diagnosis Date   Aortic stenosis    Status post AVR, 25 mm Edwards pericardial Magna-Ease valve 2014   Arthritis    CAD (coronary artery disease)    Status post SVG to OM 2014   COPD (chronic obstructive pulmonary disease) (HCC)    Depression    ESRD on peritoneal dialysis ([place])    Essential hypertension    Gout    Hemorrhoid    Hypothyroidism    Kidney stones    Mixed hyperlipidemia    Orthostatic hypotension    Pulmonary emboli ([place]) 08/21/2020   Syncope     Past Surgical History:  Procedure Laterality Date   ANGIOPASTY     AORTIC VALVE REPLACEMENT  07/11/2012   Procedure: AORTIC VALVE REPLACEMENT (AVR);  Surgeon: Gaye Pollack, MD;  Location: [facility];  Service: Open Heart Surgery;  Laterality: N/A;   AV FISTULA PLACEMENT Left 05/30/2020   Procedure: ARTERIOVENOUS (AV) FISTULA CREATION LEFT;  Surgeon: [name], MD;  Location: [facility];  Service: Vascular;  Laterality: Left;   CARDIAC CATHETERIZATION     CAROTID ENDARTERECTOMY     COLONOSCOPY WITH PROPOFOL N/A 07/16/2020   Procedure: COLONOSCOPY WITH PROPOFOL;  Surgeon: Doran Stabler, MD;  Location: [facility];  Service: Gastroenterology;  Laterality: N/A;   CORONARY ARTERY BYPASS GRAFT  07/11/2012   Procedure: CORONARY ARTERY  BYPASS GRAFTING (CABG);  Surgeon: Gaye Pollack, MD;  Location: [facility];  Service: Open Heart Surgery;  Laterality: N/A;  CABG x one,  using right leg greater saphenous vein harvested endoscopically   ESOPHAGOGASTRODUODENOSCOPY (EGD) WITH PROPOFOL N/A 07/15/2020   Procedure: ESOPHAGOGASTRODUODENOSCOPY (EGD) WITH PROPOFOL;  Surgeon: Doran Stabler, MD;  Location: [facility];  Service: Gastroenterology;  Laterality: N/A;   HOT HEMOSTASIS N/A 07/16/2020   Procedure: HOT HEMOSTASIS (ARGON PLASMA COAGULATION/BICAP);  Surgeon: Doran Stabler, MD;  Location: [facility];  Service: Gastroenterology;  Laterality: N/A;   INTRAOPERATIVE  TRANSESOPHAGEAL ECHOCARDIOGRAM  07/11/2012   Procedure: INTRAOPERATIVE TRANSESOPHAGEAL ECHOCARDIOGRAM;  Surgeon: Gaye Pollack, MD;  Location: [facility];  Service: Open Heart Surgery;  Laterality: N/A;   IR PERC TUN PERIT CATH WO PORT S&I /IMAG  06/13/2020   IR REMOVAL TUN CV CATH W/O FL  07/26/2020   IR US GUIDE VASC ACCESS RIGHT  06/13/2020   JOINT REPLACEMENT     TEE WITHOUT CARDIOVERSION N/A 06/12/2020   Procedure: TRANSESOPHAGEAL ECHOCARDIOGRAM (TEE);  Surgeon: Skeet Latch, MD;  [facility];  Service: Cardiovascular;  Laterality: N/A;    Current Outpatient Medications  Medication Sig Dispense Refill   albuterol (PROVENTIL HFA;VENTOLIN HFA) 108 (90 BASE) MCG/ACT inhaler Inhale 2 puffs into the lungs every 6 (six) hours as needed for wheezing or shortness of breath.      allopurinol (ZYLOPRIM) 300 MG tablet Take 300 mg by mouth daily.     apixaban (ELIQUIS) 5 MG TABS tablet Take 1 tablet (5 mg total) by mouth 2 (two) times daily. STOP Eliquis the END OF June 2022 60 tablet 3   aspirin EC 81 MG tablet Take 1 tablet (81 mg total) by mouth daily. Swallow whole. 150 tablet 2   AURYXIA 1 GM 210 MG(Fe) tablet Take 500 mg by mouth 3 (three) times daily with meals.     B Complex-C-Folic Acid (RENAL) 1 MG CAPS Take 1 capsule by mouth daily.     Cholecalciferol (VITAMIN D3) 50 MCG (2000 UT) capsule Take 2,000 Units by mouth See admin instructions.     docusate sodium (COLACE) 100 MG capsule Take 100 mg by mouth 2 (two) times daily as needed.     finasteride (PROSCAR) 5 MG tablet Take 5 mg by mouth daily.     fluconazole (DIFLUCAN) 100 MG tablet Take 100 mg by mouth daily.     fluticasone (FLONASE) 50 MCG/ACT nasal spray Place 1 spray into both nostrils daily as needed.     gabapentin (NEURONTIN) 100 MG capsule Take 100 mg by mouth at bedtime.     ipratropium-albuterol (DUONEB) 0.5-2.5 (3) MG/3ML SOLN Take 3 mLs by nebulization every 6 (six) hours as needed (shortness of breath).      L-Lysine 500 MG TABS Take 500 mg by mouth daily.      levothyroxine (SYNTHROID) 175 MCG tablet Take 175 mcg by mouth daily before breakfast.      Multiple Minerals-Vitamins (CALCIUM-MAGNESIUM-ZINC-D3) TABS Take 1 tablet by mouth daily.     Multiple Vitamin (MULTIVITAMIN) tablet Take 1 tablet by mouth daily.     Omega-3 Fatty Acids (FISH OIL PO) Take 2 capsules by mouth daily.      pantoprazole (PROTONIX) 40 MG tablet Take 40 mg by mouth daily.     polyethylene glycol (MIRALAX / GLYCOLAX) 17 g packet Take 17 g by mouth daily. 14 each 0   simvastatin (ZOCOR) 40 MG tablet Take 40 mg by mouth every  evening.     torsemide (DEMADEX) 100 MG tablet Take 1 tablet by mouth 2 (two) times daily.     Travoprost, BAK Free, (TRAVATAN) 0.004 % SOLN ophthalmic solution Place 1 drop into both eyes at bedtime.     TRINTELLIX 20 MG TABS tablet Take 20 mg by mouth daily.     metoprolol tartrate (LOPRESSOR) 25 MG tablet Take 0.5 tablets (12.5 mg total) by mouth 2 (two) times daily. 30 tablet 6   No current facility-administered medications for this visit.   Allergies:  Patient has no known allergies.   Social History: The patient  reports that he quit smoking about 10 years ago. His smoking use included cigarettes. He has a 150.00 pack-year smoking history. He has quit using smokeless tobacco. He reports that he does not currently use alcohol. He reports that he does not use drugs.   Family History: The patient's family history includes Heart disease in his father and mother; Hypertension in his father.   ROS:  Please see the history of present illness. Otherwise, complete review of systems is positive for none.  All other systems are reviewed and negative.   Physical Exam: VS:  BP 126/64   Pulse 64   Ht 5\' 9"  (1.753 m)   Wt 267 lb 9.6 oz (121.4 kg)   SpO2 98%   BMI 39.52 kg/m , BMI Body mass index is 39.52 kg/m.  Wt Readings from Last 3 Encounters:  04/03/21 267 lb 9.6 oz (121.4 kg)  10/24/20 255 lb  (115.7 kg)  08/21/20 254 lb (115.2 kg)    General: Patient appears comfortable at rest.  Neck: Morbidly obese Supple, no elevated JVP or carotid bruits, no thyromegaly. Lungs: Clear to auscultation, nonlabored breathing at rest. Cardiac: Regular rate and rhythm, no S3 or significant systolic murmur, no pericardial rub. Abdomen: Soft, nontender, no hepatomegaly, bowel sounds present, no guarding or rebound. Extremities: No pitting edema, distal pulses 2+. Skin: Warm and dry. Musculoskeletal: No kyphosis. Neuropsychiatric: Alert and oriented x3, affect grossly appropriate.  ECG:  EKG 07/15/2020 sinus rhythm with rate of 71, prolonged PR  Recent Labwork: 06/08/2020: B Natriuretic Peptide 2,673.0 07/14/2020: ALT 15; AST 26 07/17/2020: Magnesium 2.1 07/23/2020: BUN 31; Creatinine, Ser 4.43; Hemoglobin 8.2; Platelets 250; Potassium 4.3; Sodium 140     Component Value Date/Time   CHOL 169 01/26/2019 0000   TRIG 80 06/08/2020 1621   HDL 45 01/26/2019 0000   CHOLHDL 4.5 08/23/2012 0710   VLDL 43 (H) 08/23/2012 0710   LDLCALC 99 01/26/2019 0000    Other Studies Reviewed Today:  Echocardiogram 06/10/2020:  1. Left ventricular ejection fraction, by estimation, is 60 to 65%. The  left ventricle has normal function. The left ventricle has no regional  wall motion abnormalities. There is moderate left ventricular hypertrophy.  Left ventricular diastolic  parameters are consistent with Grade II diastolic dysfunction  (pseudonormalization).   2. Right ventricular systolic function is mildly reduced. The right  ventricular size is mildly enlarged. There is severely elevated pulmonary  artery systolic pressure. The estimated right ventricular systolic  pressure is 05.3 mmHg.   3. Left atrial size was mild to moderately dilated.   4. Right atrial size was mildly dilated.   5. The mitral valve is degenerative. Mild to moderate mitral valve  regurgitation. Mild to moderate mitral stenosis, mean  gradient 11 mmHg but  MVA 2.3 cm^2 by PHT. Suspect elevated gradient related in part to high  flow. Moderate to severe mitral annular  calcification.   6. Bioprosthetic aortic valve. Mean gradient 28 mmHg, moderately  elevated. No significant regurgitation.   7. Tricuspid valve regurgitation is moderate.   8. The inferior vena cava is dilated in size with <50% respiratory  variability, suggesting right atrial pressure of 15 mmHg.   9. Neither the bioprosthetic aortic valve nor the heavily calcified  mitral valve/annulus were visualized well enough to rule out endocarditis.  Suggest TEE.    TEE 06/12/2020:  1. Left ventricular ejection fraction, by estimation, is 60 to 65%. The  left ventricle has normal function. The left ventricle has no regional  wall motion abnormalities.   2. Right ventricular systolic function is normal. The right ventricular  size is normal.   3. Left atrial size was mildly dilated. No left atrial/left atrial  appendage thrombus was detected.   4. Right atrial size was mildly dilated.   5. Multiple regurgitant jets. The mitral valve is degenerative. There is  a mobile density on the anterior mitral valve leaflet that is poorly  visualized but seems to have also been present on the TTE 07/07/2017. The  mitral valve is degenerative. Mild  mitral valve regurgitation. Mild to moderate mitral stenosis. Moderate to  severe mitral annular calcification.   6. Tricuspid valve regurgitation is moderate to severe.   7. 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 Aortic  valve gradients are moderately elevated, unchanged from 2019. There is a  fixed, calcified density in the LVOT unchanged from 2019. The aortic valve  has been repaired/replaced.  Aortic valve regurgitation is not visualized. Moderate aortic valve  stenosis. 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  Aortic valve mean gradient measures 28.0 mmHg. Aortic valve Vmax  measures  3.52 m/s.  Assessment and Plan:  1. History of aortic valve replacement with bioprosthetic valve   2. CAD in native artery   3. Mixed hyperlipidemia   4. History of pulmonary embolism   5. ESRD (end stage renal disease) ([place])   6. Slow heart rate    1. History of aortic valve replacement with bioprosthetic valve TEE in December 2021 showed bioprosthetic aortic valve leaflets were thin and moving well.  Aortic valve gradients were mildly elevated unchanged from 2019.  There was a fixed, calcified density in the LVOT unchanged from 2019.  Aortic valve has been repaired and replaced.  Aortic regurgitation not visualized, moderate aortic valve stenosis, prosthetic valve present in the aortic position.  Aortic valve mean gradient 28 mmHg, aortic valve V-max measures 3.5 m/s.  Continue Eliquis 5 mg p.o. twice daily.   2. CAD in native artery Denies any current anginal symptoms.  Continue aspirin 81 mg daily.   3. Mixed hyperlipidemia Continue simvastatin 40 mg p.o. daily.  4. History of pulmonary embolism No recent DVT or PE-like symptoms.  Continue Eliquis 5 mg p.o. twice daily.  5. ESRD (end stage renal disease) ([place]) Continues with PD without issue.  He has an AV fistula in his left upper extremity in anticipation of HD should PD catheter fail.  Continue torsemide 100 mg p.o. twice daily.  6.  Slow heart rate Patient states his metoprolol was recently decreased to once a day 25 mg.  States his heart rate has increased since the change.  Advised him to take 12.5 mg p.o. twice daily instead of once a day.  Heart rate today 64.  Blood pressure 126/64  Medication Adjustments/Labs and Tests Ordered: Current medicines are reviewed at length with  the patient today.  Concerns regarding medicines are outlined above.   Disposition: Follow-up with Dr. Domenic Polite or APP 6 months  Signed, Levell July, NP 04/03/2021 10:49 AM    [facility] [place], [place], [place] [address] Phone: [phone]; Fax: [phone]

## 2021-04-03 ENCOUNTER — Encounter: Payer: Self-pay | Admitting: Family Medicine

## 2021-04-03 ENCOUNTER — Other Ambulatory Visit: Payer: Self-pay

## 2021-04-03 ENCOUNTER — Ambulatory Visit (INDEPENDENT_AMBULATORY_CARE_PROVIDER_SITE_OTHER): Payer: Managed Care, Other (non HMO) | Admitting: Family Medicine

## 2021-04-03 VITALS — BP 126/64 | HR 64 | Ht 69.0 in | Wt 267.6 lb

## 2021-04-03 DIAGNOSIS — I2510 Atherosclerotic heart disease of native coronary artery without angina pectoris: Secondary | ICD-10-CM

## 2021-04-03 DIAGNOSIS — E782 Mixed hyperlipidemia: Secondary | ICD-10-CM | POA: Diagnosis not present

## 2021-04-03 DIAGNOSIS — Z86711 Personal history of pulmonary embolism: Secondary | ICD-10-CM | POA: Diagnosis not present

## 2021-04-03 DIAGNOSIS — R001 Bradycardia, unspecified: Secondary | ICD-10-CM

## 2021-04-03 DIAGNOSIS — N186 End stage renal disease: Secondary | ICD-10-CM

## 2021-04-03 DIAGNOSIS — Z953 Presence of xenogenic heart valve: Secondary | ICD-10-CM

## 2021-04-03 MED ORDER — METOPROLOL TARTRATE 25 MG PO TABS: 12.5000 mg | ORAL_TABLET | Freq: Two times a day (BID) | ORAL | 6 refills | Status: DC

## 2021-04-03 NOTE — Patient Instructions (Signed)
Medication Instructions:  Your physician has recommended you make the following change in your medication:  Change metoprolol tartrate to 12.5 mg twice daily Continue other medications the same  Labwork: none  Testing/Procedures: none  Follow-Up: Your physician recommends that you schedule a follow-up appointment in: 6 months  Any Other Special Instructions Will Be Listed Below (If Applicable).  If you need a refill on your cardiac medications before your next appointment, please call your pharmacy.

## 2021-06-03 ENCOUNTER — Encounter (HOSPITAL_COMMUNITY): Payer: Self-pay

## 2021-06-03 ENCOUNTER — Other Ambulatory Visit: Payer: Self-pay

## 2021-06-03 ENCOUNTER — Encounter (HOSPITAL_COMMUNITY)
Admission: RE | Admit: 2021-06-03 | Discharge: 2021-06-03 | Disposition: A | Discharge status: Home / Self Care | Payer: Managed Care, Other (non HMO) | Source: Ambulatory Visit | Attending: Ophthalmology | Admitting: Ophthalmology

## 2021-06-03 HISTORY — DX: Dependence on renal dialysis: Z99.2

## 2021-06-04 NOTE — H&P (Signed)
Surgical History & Physical  Patient Name: [name] DOB: [birth year]  Surgery: Cataract extraction with intraocular lens implant phacoemulsification; Right Eye & Insertion of aqueous drainage device, stent; Right Eye  Surgeon: Baruch Goldmann MD Surgery Date:  06-09-21 Pre-Op Date:  05-28-21  HPI: A 69 Yr. old male patient is here for 1 month cataract eval 1. The patient complains of nighttime light - car headlights, street lamps etc. glare causing poor vision, which began 6 months ago. Both eyes are affected. The episode is gradual. The condition's severity increased since last visit. Symptoms occur when the patient is driving and outside. This is negatively affecting the patient's quality of life. Pt is using Lumigan QHS OU, has some trouble taking this drop. HPI was performed by Baruch Goldmann .  Medical History: Glaucoma Cataracts no family h/o glaucoma or AMD Gout Heart Problem High Blood Pressure Thyroid Problems  Review of Systems Negative Allergic/Immunologic Negative Cardiovascular Negative Constitutional Negative Ear, Nose, Mouth & Throat Negative Endocrine Negative Eyes Negative Gastrointestinal Negative Genitourinary Negative Hemotologic/Lymphatic Negative Integumentary Negative Musculoskeletal Negative Neurological Negative Psychiatry Negative Respiratory  Social   Former smoker   Medication Artifical Tears, Lumigan, Allopurinol, Metoprolol, Synthroid, Zocor,   Sx/Procedures Laser Trabeculoplasty, Laser Trabeculoplasty, Laser Trabeculoplasty, Durysta Implant,  Open Heart Bypass, Carotid Artery Sx, Bilateral Knee Replacement,   Drug Allergies  Dorzolamide,   History & Physical: Heent: Cataract, Right Eye; Glaucoma, Right Eye NECK: supple without bruits LUNGS: lungs clear to auscultation CV: regular rate and rhythm Abdomen: soft and non-tender  Impression & Plan: Assessment: 1.  COMBINED FORMS AGE RELATED CATARACT; Both Eyes (H25.813) 2.  PRIMARY  OPEN ANGLE GLAUCOMA; Right Eye Mild, Left Eye Severe (H40.1111, H40.1123) 3.  BLEPHARITIS; Right Lower Lid, Left Upper Lid, Left Lower Lid, Right Upper Lid (H01.001, H01.002,H01.004,H01.005) 4.  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  Pinguecula; Both Eyes (H11.153) 6.  Hyperopia ; Right Eye (H52.01)  Plan: 1.  Cataract accounts for the patient's decreased vision. This visual impairment is not correctable with a tolerable change in glasses or contact lenses. Cataract surgery with an implantation of a new lens should significantly improve the visual and functional status of the patient. Discussed all risks, benefits, alternatives, and potential complications. Discussed the procedures and recovery. Patient desires to have surgery. A-scan ordered and performed today for intra-ocular lens calculations. The surgery will be performed in order to improve vision for driving, reading, and for eye examinations. Recommend phacoemulsification with intra-ocular lens. Recommend Dextenza for post-operative pain and inflammation. Right Eye. Worse first. Dilates poorly - shugarcaine by protocol. Omidira. Recommend iSent inject W to control pressure in setting of glaucoma.  2.  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  S/P Durysta OS 12/07/2019 - implant has dissolved  Patient has failed to maintain IOP at goal with use of Lumigan and Latanoprost alone. Patient has failed to maintain IOP goal with Alphagan. Patient cannot take timolol due to his COPD.  Eye pressure at goal OU today. HVF 24-2 shows inferior arcuate OD, global dense depression OS 7/22 OCT rNFL thinning OU - stable 7/22  Continue Lumigan 1 drop both eyes every night at bedtime.  Detailed discussion about glaucoma today including importance of maintaining good follow up and following treatment plan, and the possibility of irreversible blindness as part of this disease process. Patient has numerous drop  intolerances and has trouble with compliance. Recommend iStent inject W both eyes at the time of cataract surgery to improve compliance and help  lower eye pressure.  3.  Begin/continue lid scrubs. Continue lid scrubs.  4.  Artificial tears 2-3x/day PRN for symptoms  5.  Observe; Artificial tears as needed for irritation.  6.

## 2021-06-09 ENCOUNTER — Ambulatory Visit (HOSPITAL_COMMUNITY)
Admission: RE | Admit: 2021-06-09 | Discharge: 2021-06-09 | Disposition: A | Discharge status: Home / Self Care | Payer: Managed Care, Other (non HMO) | Attending: Ophthalmology | Admitting: Ophthalmology

## 2021-06-09 ENCOUNTER — Encounter (HOSPITAL_COMMUNITY): Payer: Self-pay | Admitting: Ophthalmology

## 2021-06-09 ENCOUNTER — Ambulatory Visit (HOSPITAL_COMMUNITY): Payer: Managed Care, Other (non HMO) | Admitting: Anesthesiology

## 2021-06-09 ENCOUNTER — Encounter (HOSPITAL_COMMUNITY)
Admission: RE | Disposition: A | Discharge status: Home / Self Care | Payer: Self-pay | Source: Home / Self Care | Attending: Ophthalmology

## 2021-06-09 DIAGNOSIS — H0100B Unspecified blepharitis left eye, upper and lower eyelids: Secondary | ICD-10-CM | POA: Diagnosis not present

## 2021-06-09 DIAGNOSIS — M3501 Sicca syndrome with keratoconjunctivitis: Secondary | ICD-10-CM | POA: Diagnosis not present

## 2021-06-09 DIAGNOSIS — H11153 Pinguecula, bilateral: Secondary | ICD-10-CM | POA: Insufficient documentation

## 2021-06-09 DIAGNOSIS — H5201 Hypermetropia, right eye: Secondary | ICD-10-CM | POA: Diagnosis not present

## 2021-06-09 DIAGNOSIS — H25811 Combined forms of age-related cataract, right eye: Secondary | ICD-10-CM | POA: Insufficient documentation

## 2021-06-09 DIAGNOSIS — Z87891 Personal history of nicotine dependence: Secondary | ICD-10-CM | POA: Diagnosis not present

## 2021-06-09 DIAGNOSIS — H401111 Primary open-angle glaucoma, right eye, mild stage: Secondary | ICD-10-CM | POA: Diagnosis not present

## 2021-06-09 DIAGNOSIS — H0100A Unspecified blepharitis right eye, upper and lower eyelids: Secondary | ICD-10-CM | POA: Insufficient documentation

## 2021-06-09 HISTORY — PX: INSERTION OF ANTERIOR SEGMENT AQUEOUS DRAINAGE DEVICE (ISTENT): SHX6783

## 2021-06-09 HISTORY — PX: CATARACT EXTRACTION W/PHACO: SHX586

## 2021-06-09 LAB — POCT I-STAT, CHEM 8
BUN: 75 mg/dL — ABNORMAL HIGH (ref 8–23)
Calcium, Ion: 1.14 mmol/L — ABNORMAL LOW (ref 1.15–1.40)
Chloride: 103 mmol/L (ref 98–111)
Creatinine, Ser: 8.6 mg/dL — ABNORMAL HIGH (ref 0.61–1.24)
Glucose, Bld: 98 mg/dL (ref 70–99)
HCT: 38 % — ABNORMAL LOW (ref 39.0–52.0)
Hemoglobin: 12.9 g/dL — ABNORMAL LOW (ref 13.0–17.0)
Potassium: 4.5 mmol/L (ref 3.5–5.1)
Sodium: 140 mmol/L (ref 135–145)
TCO2: 21 mmol/L — ABNORMAL LOW (ref 22–32)

## 2021-06-09 SURGERY — PHACOEMULSIFICATION, CATARACT, WITH IOL INSERTION
Anesthesia: Monitor Anesthesia Care | Site: Eye | Laterality: Right

## 2021-06-09 MED ORDER — PHENYLEPHRINE-KETOROLAC 1-0.3 % IO SOLN
INTRAOCULAR | Status: DC | PRN
  Administered 2021-06-09: 500 mL via OPHTHALMIC

## 2021-06-09 MED ORDER — TROPICAMIDE 1 % OP SOLN
1.0000 [drp] | OPHTHALMIC | Status: AC | PRN
  Administered 2021-06-09 (×3): 1 [drp] via OPHTHALMIC
  Filled 2021-06-09: qty 2

## 2021-06-09 MED ORDER — PHENYLEPHRINE HCL 2.5 % OP SOLN
1.0000 [drp] | OPHTHALMIC | Status: AC | PRN
  Administered 2021-06-09 (×3): 1 [drp] via OPHTHALMIC

## 2021-06-09 MED ORDER — SODIUM HYALURONATE 10 MG/ML IO SOLUTION
PREFILLED_SYRINGE | INTRAOCULAR | Status: DC | PRN
  Administered 2021-06-09: 0.85 mL via INTRAOCULAR

## 2021-06-09 MED ORDER — BSS IO SOLN
INTRAOCULAR | Status: DC | PRN
  Administered 2021-06-09: 15 mL via INTRAOCULAR

## 2021-06-09 MED ORDER — SODIUM HYALURONATE 23MG/ML IO SOSY
PREFILLED_SYRINGE | INTRAOCULAR | Status: DC | PRN
  Administered 2021-06-09: 0.6 mL via INTRAOCULAR

## 2021-06-09 MED ORDER — POVIDONE-IODINE 5 % OP SOLN
OPHTHALMIC | Status: DC | PRN
  Administered 2021-06-09: 1 via OPHTHALMIC

## 2021-06-09 MED ORDER — LIDOCAINE HCL (PF) 1 % IJ SOLN
INTRAOCULAR | Status: DC | PRN
  Administered 2021-06-09: 1 mL via OPHTHALMIC

## 2021-06-09 MED ORDER — NEOMYCIN-POLYMYXIN-DEXAMETH 3.5-10000-0.1 OP SUSP
OPHTHALMIC | Status: DC | PRN
  Administered 2021-06-09: 1 [drp] via OPHTHALMIC

## 2021-06-09 MED ORDER — STERILE WATER FOR IRRIGATION IR SOLN
Status: DC | PRN
  Administered 2021-06-09: 250 mL

## 2021-06-09 MED ORDER — PHENYLEPHRINE-KETOROLAC 1-0.3 % IO SOLN
INTRAOCULAR | Status: AC
  Filled 2021-06-09: qty 4

## 2021-06-09 MED ORDER — TETRACAINE HCL 0.5 % OP SOLN
1.0000 [drp] | OPHTHALMIC | Status: AC | PRN
  Administered 2021-06-09 (×3): 1 [drp] via OPHTHALMIC

## 2021-06-09 MED ORDER — MIDAZOLAM HCL 2 MG/2ML IJ SOLN
INTRAMUSCULAR | Status: AC
  Filled 2021-06-09: qty 2

## 2021-06-09 MED ORDER — MIDAZOLAM HCL 2 MG/2ML IJ SOLN
INTRAMUSCULAR | Status: DC | PRN
  Administered 2021-06-09: 2 mg via INTRAVENOUS

## 2021-06-09 MED ORDER — EPINEPHRINE PF 1 MG/ML IJ SOLN
INTRAMUSCULAR | Status: AC
  Filled 2021-06-09: qty 1

## 2021-06-09 MED ORDER — LIDOCAINE HCL 3.5 % OP GEL
1.0000 "application " | Freq: Once | OPHTHALMIC | Status: AC
  Administered 2021-06-09: 1 via OPHTHALMIC

## 2021-06-09 SURGICAL SUPPLY — 16 items
CATARACT SUITE SIGHTPATH (MISCELLANEOUS) ×1 IMPLANT
CLOTH BEACON ORANGE TIMEOUT ST (SAFETY) ×1 IMPLANT
DEVICE IMPLANT ISTENT GLAUKOS (Stent) IMPLANT
EYE SHIELD UNIVERSAL CLEAR (GAUZE/BANDAGES/DRESSINGS) ×1 IMPLANT
GLOVE SURG UNDER POLY LF SZ6.5 (GLOVE) ×1 IMPLANT
GLOVE SURG UNDER POLY LF SZ7 (GLOVE) ×1 IMPLANT
ISTENT GLAUKOS (Stent) ×2 IMPLANT
LOOP SUT CHROMIC 2-0 SGL1 (SUTURE) ×1 IMPLANT
NDL HYPO 18GX1.5 BLUNT FILL (NEEDLE) IMPLANT
NEEDLE HYPO 18GX1.5 BLUNT FILL (NEEDLE) ×2 IMPLANT
PAD ARMBOARD 7.5X6 YLW CONV (MISCELLANEOUS) ×1 IMPLANT
RayOne EMV US (Intraocular Lens) ×1 IMPLANT
SYR TB 1ML LL NO SAFETY (SYRINGE) ×1 IMPLANT
TAPE SURG TRANSPORE 1 IN (GAUZE/BANDAGES/DRESSINGS) IMPLANT
TAPE SURGICAL TRANSPORE 1 IN (GAUZE/BANDAGES/DRESSINGS) ×2
WATER STERILE IRR 250ML POUR (IV SOLUTION) ×1 IMPLANT

## 2021-06-09 NOTE — Discharge Instructions (Addendum)
Please discharge patient when stable, will follow up today with Dr. Wrzosek at the [facility] [place] office immediately following discharge.  Leave shield in place until visit.  All paperwork with discharge instructions will be given at the office.  [facility] Address:  [address]  [place], [place] [address]  

## 2021-06-09 NOTE — Anesthesia Preprocedure Evaluation (Signed)
Anesthesia Evaluation  Patient identified by MRN, date of birth, ID band Patient awake    Reviewed: Allergy & Precautions, NPO status , Patient's Chart, lab work & pertinent test results, reviewed documented beta blocker date and time   Airway Mallampati: II  TM Distance: >3 FB Neck ROM: Full    Dental  (+) Dental Advisory Given, Edentulous Upper, Missing   Pulmonary COPD,  COPD inhaler, former smoker,    Pulmonary exam normal breath sounds clear to auscultation       Cardiovascular Exercise Tolerance: Good hypertension, Pt. on medications and Pt. on home beta blockers + CAD, + CABG and +CHF  Normal cardiovascular exam+ Valvular Problems/Murmurs (AVR) AS  Rhythm:Regular Rate:Normal     Neuro/Psych PSYCHIATRIC DISORDERS Depression    GI/Hepatic negative GI ROS, Neg liver ROS,   Endo/Other  Hypothyroidism   Renal/GU ESRF and DialysisRenal disease     Musculoskeletal  (+) Arthritis ,   Abdominal   Peds  Hematology  (+) anemia ,   Anesthesia Other Findings   Reproductive/Obstetrics                            Anesthesia Physical Anesthesia Plan  ASA: 3  Anesthesia Plan: MAC   Post-op Pain Management: Minimal or no pain anticipated   Induction:   PONV Risk Score and Plan:   Airway Management Planned: Nasal Cannula and Natural Airway  Additional Equipment:   Intra-op Plan:   Post-operative Plan:   Informed Consent: I have reviewed the patients History and Physical, chart, labs and discussed the procedure including the risks, benefits and alternatives for the proposed anesthesia with the patient or authorized representative who has indicated his/her understanding and acceptance.     Dental advisory given  Plan Discussed with: CRNA and Surgeon  Anesthesia Plan Comments:         Anesthesia Quick Evaluation

## 2021-06-09 NOTE — Interval H&P Note (Signed)
History and Physical Interval Note:  06/09/2021 11:37 AM  [name]  has presented today for surgery, with the diagnosis of nuclear cataract right eye, glaucoma.  The various methods of treatment have been discussed with the patient and family. After consideration of risks, benefits and other options for treatment, the patient has consented to  Procedure(s) with comments: CATARACT EXTRACTION PHACO AND INTRAOCULAR LENS PLACEMENT ([place]) (Right) - Pt does at home dialysis every night [name] (ISTENT) (Right) as a surgical intervention.  The patient's history has been reviewed, patient examined, no change in status, stable for surgery.  I have reviewed the patient's chart and labs.  Questions were answered to the patient's satisfaction.     Baruch Goldmann

## 2021-06-09 NOTE — Transfer of Care (Signed)
Immediate Anesthesia Transfer of Care Note  Patient: [name]  Procedure(s) Performed: CATARACT EXTRACTION RIGHT EYE W/ PHACO AND INTRAOCULAR LENS PLACEMENT ([facility]) (Right: Eye) INSERTION OF RIGHT EYE ANTERIOR SEGMENT AQUEOUS DRAINAGE DEVICE (ISTENT) (Right: Eye)  Patient Location: Short Stay  Anesthesia Type:MAC  Level of Consciousness: awake, alert  and oriented  Airway & Oxygen Therapy: Patient Spontanous Breathing  Post-op Assessment: Report given to RN, Post -op Vital signs reviewed and stable and Patient moving all extremities X 4  Post vital signs: Reviewed and stable  Last Vitals:  Vitals Value Taken Time  BP    Temp    Pulse    Resp    SpO2      Last Pain:  Vitals:   06/09/21 1111  TempSrc: Oral  PainSc: 0-No pain      Patients Stated Pain Goal: 5 (69/62/95 2841)  Complications: No notable events documented.

## 2021-06-09 NOTE — Anesthesia Procedure Notes (Signed)
Procedure Name: MAC Date/Time: 06/09/2021 11:45 AM Performed by: Jonna Munro, CRNA Pre-anesthesia Checklist: Patient identified, Emergency Drugs available, Suction available and Patient being monitored Patient Re-evaluated:Patient Re-evaluated prior to induction Oxygen Delivery Method: Nasal cannula Placement Confirmation: positive ETCO2

## 2021-06-09 NOTE — Op Note (Signed)
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 Kahron Kauth, MD, MA  Anesthesia: MAC, Topical Akten  Complications: None  Estimated Blood Loss: <64m (minimal)  Specimens: None  Implants: As above  Indications:  Visually significant age-related cataract, Right Eye; Glaucoma, Right Eye  Procedure:  The patient was seen and identified in the pre-operative area. The operative eye was identified and dilated.  The operative eye was marked.  Topical anesthesia was administered to the operative eye.     The patient was then to the operative suite and placed in the supine position.  A timeout was performed confirming the patient, procedure to be performed, and all other relevant information.   The patient's face was prepped and draped in the usual fashion for intra-ocular surgery.  A lid speculum was placed into the operative eye and the surgical microscope moved into place and focused.  A superotemporal paracentesis was created using a 20 gauge paracentesis blade.  Shugarcaine was injected into the anterior chamber.  Viscoelastic was injected into the anterior chamber.  A temporal clear-corneal main wound incision was created using a 2.473mmicrokeratome.  A continuous curvilinear capsulorrhexis was initiated using an irrigating cystitome and completed using capsulorrhexis forceps.  Hydrodissection and hydrodeliniation were performed.  Viscoelastic was  injected into the anterior chamber.  A phacoemulsification handpiece and a chopper as a second instrument were used to remove the nucleus and epinucleus. The irrigation/aspiration handpiece was used to remove any remaining cortical material.   The capsular bag was reinflated with viscoelastic, checked, and found to be intact.  The intraocular lens was inserted into the capsular bag and dialed into place using a Kuglen hook.    The patient's head was repositioned.  iStent inject W was used to place 1 stent under gonioscopy in the nasal trabecular meshwork without complication. The second stent was not able to be placed.   The patient's head was returned to neutral.  The irrigation/aspiration handpiece was used to remove any remaining viscoelastic.  The clear corneal wound and paracentesis wounds were then hydrated and checked with Weck-Cels to be watertight.  The lid-speculum was removed.  The drape was removed.  The patient's face was cleaned with a wet and dry 4x4.  Maxitrol was instilled in the eye before a clear shield was taped over the eye. The patient was taken to the post-operative care unit in good condition, having tolerated the procedure well.  Post-Op Instructions: [facility] and will receive all other orders and instructions.

## 2021-06-09 NOTE — Anesthesia Postprocedure Evaluation (Signed)
Anesthesia Post Note  Patient: [name]  Procedure(s) Performed: CATARACT EXTRACTION RIGHT EYE W/ PHACO AND INTRAOCULAR LENS PLACEMENT (IOC) (Right: Eye) INSERTION OF RIGHT EYE ANTERIOR SEGMENT AQUEOUS DRAINAGE DEVICE (ISTENT) (Right: Eye)  Patient location during evaluation: Phase II Anesthesia Type: MAC Level of consciousness: awake and alert and oriented Pain management: pain level controlled Vital Signs Assessment: post-procedure vital signs reviewed and stable Respiratory status: spontaneous breathing, nonlabored ventilation and respiratory function stable Cardiovascular status: stable and blood pressure returned to baseline Postop Assessment: no apparent nausea or vomiting Anesthetic complications: no   No notable events documented.   Last Vitals:  Vitals:   06/09/21 1111 06/09/21 1230  BP: (!) 142/78 (!) 146/80  Pulse:  88  Resp: 17 18  Temp: 36.6 C 36.6 C  SpO2: 97% 98%    Last Pain:  Vitals:   06/09/21 1230  TempSrc: Oral  PainSc: 0-No pain                 Dorita Rowlands C Donald Memoli

## 2021-06-10 ENCOUNTER — Encounter (HOSPITAL_COMMUNITY): Payer: Self-pay | Admitting: Ophthalmology

## 2021-06-20 ENCOUNTER — Encounter (HOSPITAL_COMMUNITY)
Admit: 2021-06-20 | Discharge: 2021-06-20 | Disposition: A | Discharge status: Home / Self Care | Payer: Managed Care, Other (non HMO) | Attending: Ophthalmology | Admitting: Ophthalmology

## 2021-06-20 ENCOUNTER — Other Ambulatory Visit: Payer: Self-pay

## 2021-06-20 ENCOUNTER — Encounter (HOSPITAL_COMMUNITY): Payer: Self-pay

## 2021-06-24 NOTE — H&P (Signed)
Surgical History & Physical  Patient Name: [name] DOB: [birth year]  Surgery: Cataract extraction with intraocular lens implant phacoemulsification; Left Eye & iAccess Goniotomy; Left Eye  Surgeon: Baruch Goldmann MD Surgery Date:  06-27-21 Pre-Op Date:  06-16-21  HPI: A 68 Yr. old male patient The patient is returning after cataract surgery. The right eye is affected. Status post cataract surgery, which began 1 week ago:06/09/21. Since the last visit, the affected area is doing well and improved since visit with Dr. Cephus Shelling. The patient's vision is much brighter. Patient is following medication instructions. Pt taking PO combo drop TID OD and Lumigan qhs OU. Pt denies any throbbing pain or flashes/floaters. The patient complains of nighttime light - car headlights, street lamps etc. glare causing poor vision, which began 6 months ago. The left eye is affected. The episode is gradual. The condition's severity increased since last visit. Symptoms occur when the patient is driving and outside. This is negatively affecting the patient's quality of life and the patient is unable to function adequately in life with the current level of vision. HPI was performed by Baruch Goldmann .  Medical History: Glaucoma Cataracts no family h/o glaucoma or AMD Gout Heart Problem High Blood Pressure Thyroid Problems  Review of Systems Negative Allergic/Immunologic Negative Cardiovascular Negative Constitutional Negative Ear, Nose, Mouth & Throat Negative Endocrine Negative Eyes Negative Gastrointestinal Negative Genitourinary Negative Hemotologic/Lymphatic Negative Integumentary Negative Musculoskeletal Negative Neurological Negative Psychiatry Negative Respiratory  Social   Former smoker  Medication Artifical Tears, Lumigan, Prednisolone-Moxifloxacin-Bromfenac, Allopurinol, Metoprolol, Synthroid, Zocor,   Sx/Procedures Laser Trabeculoplasty, Laser Trabeculoplasty, Laser Trabeculoplasty, Durysta  Implant, Phaco c IOL OD with Glaucoma I-Stent, Open Heart Bypass, Carotid Artery Sx, Bilateral Knee Replacement,   Drug Allergies  Dorzolamide,   History & Physical: Heent: Cataract, Left Eye & Glaucoma, Left Eye NECK: supple without bruits LUNGS: lungs clear to auscultation CV: regular rate and rhythm Abdomen: soft and non-tender  Impression & Plan: Assessment: 1.  CATARACT EXTRACTION STATUS; Right Eye (Z98.41) 2.  COMBINED FORMS AGE RELATED CATARACT; Left Eye (H25.812) 3.  PRIMARY OPEN ANGLE GLAUCOMA; Right Eye Mild, Left Eye Severe (H40.1111, N36.1443)  Plan: 1.  1 week after cataract surgery. Doing well with improved vision and normal eye pressure. Call with any problems or concerns. Continue Pred-Moxi-Brom 2x/day for 3 more weeks.  2.  Cataract accounts for the patient's decreased vision. This visual impairment is not correctable with a tolerable change in glasses or contact lenses. Cataract surgery with an implantation of a new lens should significantly improve the visual and functional status of the patient. Discussed all risks, benefits, alternatives, and potential complications. Discussed the procedures and recovery. Patient desires to have surgery. A-scan ordered and performed today for intra-ocular lens calculations. The surgery will be performed in order to improve vision for driving, reading, and for eye examinations. Recommend phacoemulsification with intra-ocular lens. Recommend Dextenza for post-operative pain and inflammation. Left Eye. Surgery required to correct imbalance of vision. Dilates poorly - shugarcaine by protocol. Malyugin Ring. Omidira. Recommend iAccess goniotomy for severe glaucoma OS.  3.  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  S/P Durysta OS 12/07/2019 - implant has dissolved S/P IStent inject W OD.  Patient has failed to maintain IOP at goal with use of Lumigan and Latanoprost alone. Patient has failed to maintain IOP  goal with Alphagan. Patient cannot take timolol due to his COPD.  Eye pressure at goal OD today. Stop Lumigan QHS OD, continue OS.  HVF 24-2  shows inferior arcuate OD, global dense depression OS 7/22 OCT rNFL thinning OU - stable 7/22  Detailed discussion about glaucoma today including importance of maintaining good follow up and following treatment plan, and the possibility of irreversible blindness as part of this disease process. Patient has numerous drop intolerances and has trouble with compliance. Recommend iAccess goniotomy left eye at the time of cataract surgery.

## 2021-06-27 ENCOUNTER — Encounter (HOSPITAL_COMMUNITY)
Admission: RE | Disposition: A | Discharge status: Home / Self Care | Payer: Self-pay | Source: Home / Self Care | Attending: Ophthalmology

## 2021-06-27 ENCOUNTER — Ambulatory Visit (HOSPITAL_COMMUNITY): Payer: Managed Care, Other (non HMO) | Admitting: Anesthesiology

## 2021-06-27 ENCOUNTER — Other Ambulatory Visit: Payer: Self-pay

## 2021-06-27 ENCOUNTER — Ambulatory Visit (HOSPITAL_COMMUNITY)
Admission: RE | Admit: 2021-06-27 | Discharge: 2021-06-27 | Disposition: A | Discharge status: Home / Self Care | Payer: Managed Care, Other (non HMO) | Attending: Ophthalmology | Admitting: Ophthalmology

## 2021-06-27 ENCOUNTER — Encounter (HOSPITAL_COMMUNITY): Payer: Self-pay | Admitting: Ophthalmology

## 2021-06-27 DIAGNOSIS — I110 Hypertensive heart disease with heart failure: Secondary | ICD-10-CM | POA: Insufficient documentation

## 2021-06-27 DIAGNOSIS — H401111 Primary open-angle glaucoma, right eye, mild stage: Secondary | ICD-10-CM | POA: Insufficient documentation

## 2021-06-27 DIAGNOSIS — I509 Heart failure, unspecified: Secondary | ICD-10-CM | POA: Insufficient documentation

## 2021-06-27 DIAGNOSIS — Z9841 Cataract extraction status, right eye: Secondary | ICD-10-CM | POA: Diagnosis not present

## 2021-06-27 DIAGNOSIS — Z87891 Personal history of nicotine dependence: Secondary | ICD-10-CM | POA: Diagnosis not present

## 2021-06-27 DIAGNOSIS — H25819 Combined forms of age-related cataract, unspecified eye: Secondary | ICD-10-CM | POA: Diagnosis present

## 2021-06-27 DIAGNOSIS — H401123 Primary open-angle glaucoma, left eye, severe stage: Secondary | ICD-10-CM | POA: Diagnosis not present

## 2021-06-27 DIAGNOSIS — H25812 Combined forms of age-related cataract, left eye: Secondary | ICD-10-CM | POA: Diagnosis not present

## 2021-06-27 HISTORY — PX: CATARACT EXTRACTION W/PHACO: SHX586

## 2021-06-27 LAB — POCT I-STAT, CHEM 8
BUN: 59 mg/dL — ABNORMAL HIGH (ref 8–23)
Calcium, Ion: 1.11 mmol/L — ABNORMAL LOW (ref 1.15–1.40)
Chloride: 105 mmol/L (ref 98–111)
Creatinine, Ser: 9.7 mg/dL — ABNORMAL HIGH (ref 0.61–1.24)
Glucose, Bld: 93 mg/dL (ref 70–99)
HCT: 36 % — ABNORMAL LOW (ref 39.0–52.0)
Hemoglobin: 12.2 g/dL — ABNORMAL LOW (ref 13.0–17.0)
Potassium: 4.8 mmol/L (ref 3.5–5.1)
Sodium: 139 mmol/L (ref 135–145)
TCO2: 22 mmol/L (ref 22–32)

## 2021-06-27 SURGERY — PHACOEMULSIFICATION, CATARACT, WITH IOL INSERTION
Anesthesia: General | Site: Eye | Laterality: Left

## 2021-06-27 MED ORDER — TROPICAMIDE 1 % OP SOLN
1.0000 [drp] | OPHTHALMIC | Status: AC | PRN
  Administered 2021-06-27 (×3): 1 [drp] via OPHTHALMIC

## 2021-06-27 MED ORDER — NEOMYCIN-POLYMYXIN-DEXAMETH 3.5-10000-0.1 OP SUSP
OPHTHALMIC | Status: DC | PRN
  Administered 2021-06-27: 1 [drp] via OPHTHALMIC

## 2021-06-27 MED ORDER — PHENYLEPHRINE HCL 2.5 % OP SOLN
1.0000 [drp] | OPHTHALMIC | Status: AC | PRN
  Administered 2021-06-27 (×3): 1 [drp] via OPHTHALMIC

## 2021-06-27 MED ORDER — PHENYLEPHRINE-KETOROLAC 1-0.3 % IO SOLN
INTRAOCULAR | Status: DC | PRN
  Administered 2021-06-27: 14:00:00 500 mL via OPHTHALMIC

## 2021-06-27 MED ORDER — SODIUM HYALURONATE 23MG/ML IO SOSY
PREFILLED_SYRINGE | INTRAOCULAR | Status: DC | PRN
  Administered 2021-06-27: 0.6 mL via INTRAOCULAR

## 2021-06-27 MED ORDER — SODIUM HYALURONATE 10 MG/ML IO SOLUTION
PREFILLED_SYRINGE | INTRAOCULAR | Status: DC | PRN
  Administered 2021-06-27: 0.85 mL via INTRAOCULAR

## 2021-06-27 MED ORDER — LIDOCAINE HCL 3.5 % OP GEL
1.0000 "application " | Freq: Once | OPHTHALMIC | Status: AC
  Administered 2021-06-27: 1 via OPHTHALMIC

## 2021-06-27 MED ORDER — MIDAZOLAM HCL 2 MG/2ML IJ SOLN
INTRAMUSCULAR | Status: AC
  Filled 2021-06-27: qty 2

## 2021-06-27 MED ORDER — TETRACAINE HCL 0.5 % OP SOLN
1.0000 [drp] | OPHTHALMIC | Status: AC | PRN
  Administered 2021-06-27 (×3): 1 [drp] via OPHTHALMIC

## 2021-06-27 MED ORDER — POVIDONE-IODINE 5 % OP SOLN
OPHTHALMIC | Status: DC | PRN
  Administered 2021-06-27: 1 via OPHTHALMIC

## 2021-06-27 MED ORDER — SODIUM CHLORIDE 0.9% FLUSH
INTRAVENOUS | Status: DC | PRN
  Administered 2021-06-27: 5 mL via INTRAVENOUS

## 2021-06-27 MED ORDER — LIDOCAINE HCL (PF) 1 % IJ SOLN
INTRAOCULAR | Status: DC | PRN
  Administered 2021-06-27: 14:00:00 1 mL via OPHTHALMIC

## 2021-06-27 MED ORDER — BSS IO SOLN
INTRAOCULAR | Status: DC | PRN
  Administered 2021-06-27: 15 mL via INTRAOCULAR

## 2021-06-27 MED ORDER — EPINEPHRINE PF 1 MG/ML IJ SOLN
INTRAMUSCULAR | Status: AC
  Filled 2021-06-27: qty 1

## 2021-06-27 MED ORDER — STERILE WATER FOR IRRIGATION IR SOLN
Status: DC | PRN
  Administered 2021-06-27: 250 mL

## 2021-06-27 MED ORDER — MIDAZOLAM HCL 5 MG/5ML IJ SOLN
INTRAMUSCULAR | Status: DC | PRN
  Administered 2021-06-27: 2 mg via INTRAVENOUS

## 2021-06-27 MED ORDER — PHENYLEPHRINE-KETOROLAC 1-0.3 % IO SOLN
INTRAOCULAR | Status: AC
  Filled 2021-06-27: qty 4

## 2021-06-27 SURGICAL SUPPLY — 12 items
CLOTH BEACON ORANGE TIMEOUT ST (SAFETY) ×1 IMPLANT
EYE SHIELD UNIVERSAL CLEAR (GAUZE/BANDAGES/DRESSINGS) ×1 IMPLANT
GLOVE SURG UNDER POLY LF SZ7 (GLOVE) ×2 IMPLANT
NDL HYPO 18GX1.5 BLUNT FILL (NEEDLE) IMPLANT
NEEDLE HYPO 18GX1.5 BLUNT FILL (NEEDLE) ×2 IMPLANT
PAD ARMBOARD 7.5X6 YLW CONV (MISCELLANEOUS) ×1 IMPLANT
RayOne EMV US (Intraocular Lens) ×1 IMPLANT
SYR TB 1ML LL NO SAFETY (SYRINGE) ×1 IMPLANT
TAPE SURG TRANSPORE 1 IN (GAUZE/BANDAGES/DRESSINGS) IMPLANT
TAPE SURGICAL TRANSPORE 1 IN (GAUZE/BANDAGES/DRESSINGS) ×2
TREPHINE GLAUKO IACCESS TRBCLR (OPHTHALMIC) ×1 IMPLANT
WATER STERILE IRR 250ML POUR (IV SOLUTION) ×1 IMPLANT

## 2021-06-27 NOTE — Transfer of Care (Signed)
Immediate Anesthesia Transfer of Care Note  Patient: [name]  Procedure(s) Performed: CATARACT EXTRACTION PHACO AND INTRAOCULAR LENS PLACEMENT (IOC) WITH IACCESS GONIOTOMY (Left: Eye)  Patient Location: Short Stay  Anesthesia Type:MAC  Level of Consciousness: awake  Airway & Oxygen Therapy: Patient Spontanous Breathing  Post-op Assessment: Report given to RN  Post vital signs: Reviewed  Last Vitals:  Vitals Value Taken Time  BP    Temp 36.7 C 06/27/21 1437  Pulse 66 06/27/21 1437  Resp 16 06/27/21 1437  SpO2 99 % 06/27/21 1437    Last Pain:  Vitals:   06/27/21 1437  TempSrc: Oral  PainSc: 0-No pain         Complications: No notable events documented.

## 2021-06-27 NOTE — Anesthesia Preprocedure Evaluation (Signed)
Anesthesia Evaluation  Patient identified by MRN, date of birth, ID band Patient awake    Reviewed: Allergy & Precautions, H&P , NPO status , Patient's Chart, lab work & pertinent test results, reviewed documented beta blocker date and time   Airway Mallampati: II  TM Distance: >3 FB Neck ROM: full    Dental no notable dental hx.    Pulmonary COPD, former smoker,    Pulmonary exam normal breath sounds clear to auscultation       Cardiovascular Exercise Tolerance: Good hypertension, + CAD and +CHF   Rhythm:regular Rate:Normal     Neuro/Psych PSYCHIATRIC DISORDERS Depression negative neurological ROS     GI/Hepatic negative GI ROS, Neg liver ROS,   Endo/Other  Hypothyroidism   Renal/GU Renal disease  negative genitourinary   Musculoskeletal   Abdominal   Peds  Hematology  (+) Blood dyscrasia, anemia ,   Anesthesia Other Findings   Reproductive/Obstetrics negative OB ROS                             Anesthesia Physical Anesthesia Plan  ASA: 3  Anesthesia Plan: MAC   Post-op Pain Management:    Induction:   PONV Risk Score and Plan:   Airway Management Planned:   Additional Equipment:   Intra-op Plan:   Post-operative Plan:   Informed Consent: I have reviewed the patients History and Physical, chart, labs and discussed the procedure including the risks, benefits and alternatives for the proposed anesthesia with the patient or authorized representative who has indicated his/her understanding and acceptance.     Dental Advisory Given  Plan Discussed with: CRNA  Anesthesia Plan Comments:         Anesthesia Quick Evaluation

## 2021-06-27 NOTE — Interval H&P Note (Signed)
History and Physical Interval Note:  06/27/2021 1:34 PM  [name]  has presented today for surgery, with the diagnosis of combined age related catarct Left eye , primary open angle glaucoma Left eye severe.  The various methods of treatment have been discussed with the patient and family. After consideration of risks, benefits and other options for treatment, the patient has consented to  Procedure(s) with comments: CATARACT EXTRACTION PHACO AND INTRAOCULAR LENS PLACEMENT ([place]) WITH IACCESS GONIOTOMY (Left) - left as a surgical intervention.  The patient's history has been reviewed, patient examined, no change in status, stable for surgery.  I have reviewed the patient's chart and labs.  Questions were answered to the patient's satisfaction.     Baruch Goldmann

## 2021-06-27 NOTE — Discharge Instructions (Signed)
Please discharge patient when stable, will follow up today with Dr. Kyung Muto at the [facility] office immediately following discharge.  Leave shield in place until visit.  All paperwork with discharge instructions will be given at the office.  [facility] Address:  [address]  [place], [place] [address]  

## 2021-06-27 NOTE — Op Note (Signed)
Date of procedure: 06/27/21  Pre-operative diagnosis:   1. Visually significant age-related cataract, Left Eye (H25.?2) 2. Primary open angle glaucoma, severe stage, left eye  Post-operative diagnosis:  1. Visually significant age-related cataract, Left Eye  2. Primary open angle glaucoma, ?? stage, left eye  Procedure:  1) Removal of cataract via phacoemulsification and insertion of intra-ocular lens Rayner RAO200E +18.5D into the capsular bag of the Left Eye 2) Goniotomy of the trabecular meshwork of the left eye using iAccess  Attending surgeon: Gerda Diss. Sagrario Lineberry, MD, MA  Anesthesia: MAC, Topical Akten  Complications: None  Estimated Blood Loss: <24m (minimal)  Specimens: None  Implants: As above  Indications:  1. Visually significant age-related cataract, Left Eye  2. Primary open angle glaucoma, severe stage, left eye  Procedure:  The patient was seen and identified in the pre-operative area. The operative eye was identified and dilated.  The operative eye was marked.  Topical anesthesia was administered to the operative eye.     The patient was then to the operative suite and placed in the supine position.  A timeout was performed confirming the patient, procedure to be performed, and all other relevant information.   The patient's face was prepped and draped in the usual fashion for intra-ocular surgery.  A lid speculum was placed into the operative eye and the surgical microscope moved into place and focused.  An inferotemporal paracentesis was created using a 20 gauge paracentesis blade.  Shugarcaine was injected into the anterior chamber.  Viscoelastic was injected into the anterior chamber.  A temporal clear-corneal main wound incision was created using a 2.451mmicrokeratome.  A continuous curvilinear capsulorrhexis was initiated using an irrigating cystitome and completed using capsulorrhexis forceps.  Hydrodissection and hydrodeliniation were performed.  Viscoelastic was  injected into the anterior chamber.  A phacoemulsification handpiece and a chopper as a second instrument were used to remove the nucleus and epinucleus. The irrigation/aspiration handpiece was used to remove any remaining cortical material.   The capsular bag was reinflated with viscoelastic, checked, and found to be intact.  The intraocular lens was inserted into the capsular bag and dialed into place using a Kuglen hook.    At this point, the patient's head and the microscope were repositioned.  The iAccess was used to create 5 goniotomies in the nasal trabecular meshwork.    The patient's head and microscope were returned to the original position.  The irrigation/aspiration handpiece was used to remove any remaining viscoelastic.  The clear corneal wound and paracentesis wounds were then hydrated and checked with Weck-Cels to be watertight.  The lid-speculum and drape were removed, and the patient's face was cleaned with a wet and dry 4x4.  Maxitrol was instilled in the eye before a clear shield was taped over the eye. The patient was taken to the post-operative care unit in good condition, having tolerated the procedure well.  Post-Op Instructions: [facility] and will receive all other orders and instructions.

## 2021-06-27 NOTE — Anesthesia Preprocedure Evaluation (Deleted)
Anesthesia Evaluation  Patient identified by MRN, date of birth, ID band Patient awake    Reviewed: Allergy & Precautions, H&P , NPO status , Patient's Chart, lab work & pertinent test results, reviewed documented beta blocker date and time   Airway Mallampati: II  TM Distance: >3 FB Neck ROM: full    Dental no notable dental hx.    Pulmonary neg pulmonary ROS, former smoker,    Pulmonary exam normal breath sounds clear to auscultation       Cardiovascular Exercise Tolerance: Good hypertension, + CAD and +CHF   Rhythm:regular Rate:Normal     Neuro/Psych PSYCHIATRIC DISORDERS Depression negative neurological ROS     GI/Hepatic negative GI ROS, Neg liver ROS,   Endo/Other  negative endocrine ROSHypothyroidism   Renal/GU ESRF and DialysisRenal diseasenegative Renal ROS  negative genitourinary   Musculoskeletal   Abdominal   Peds  Hematology negative hematology ROS (+) Blood dyscrasia, anemia ,   Anesthesia Other Findings   Reproductive/Obstetrics negative OB ROS                                                             Anesthesia Evaluation  Patient identified by MRN, date of birth, ID band Patient awake    Reviewed: Allergy & Precautions, NPO status , Patient's Chart, lab work & pertinent test results, reviewed documented beta blocker date and time   Airway Mallampati: II  TM Distance: >3 FB Neck ROM: Full    Dental  (+) Dental Advisory Given, Edentulous Upper, Missing   Pulmonary COPD,  COPD inhaler, former smoker,    Pulmonary exam normal breath sounds clear to auscultation       Cardiovascular Exercise Tolerance: Good hypertension, Pt. on medications and Pt. on home beta blockers + CAD, + CABG and +CHF  Normal cardiovascular exam+ Valvular Problems/Murmurs (AVR) AS  Rhythm:Regular Rate:Normal     Neuro/Psych PSYCHIATRIC DISORDERS Depression     GI/Hepatic negative GI ROS, Neg liver ROS,   Endo/Other  Hypothyroidism   Renal/GU ESRF and DialysisRenal disease     Musculoskeletal  (+) Arthritis ,   Abdominal   Peds  Hematology  (+) anemia ,   Anesthesia Other Findings   Reproductive/Obstetrics                            Anesthesia Physical Anesthesia Plan  ASA: 3  Anesthesia Plan: MAC   Post-op Pain Management: Minimal or no pain anticipated   Induction:   PONV Risk Score and Plan:   Airway Management Planned: Nasal Cannula and Natural Airway  Additional Equipment:   Intra-op Plan:   Post-operative Plan:   Informed Consent: I have reviewed the patients History and Physical, chart, labs and discussed the procedure including the risks, benefits and alternatives for the proposed anesthesia with the patient or authorized representative who has indicated his/her understanding and acceptance.     Dental advisory given  Plan Discussed with: CRNA and Surgeon  Anesthesia Plan Comments:         Anesthesia Quick Evaluation                                   Anesthesia Evaluation  Patient  identified by MRN, date of birth, ID band Patient awake    Reviewed: Allergy & Precautions, NPO status , Patient's Chart, lab work & pertinent test results, reviewed documented beta blocker date and time   Airway Mallampati: II  TM Distance: >3 FB Neck ROM: Full    Dental  (+) Dental Advisory Given, Edentulous Upper, Missing   Pulmonary COPD,  COPD inhaler, former smoker,    Pulmonary exam normal breath sounds clear to auscultation       Cardiovascular Exercise Tolerance: Good hypertension, Pt. on medications and Pt. on home beta blockers + CAD, + CABG and +CHF  Normal cardiovascular exam+ Valvular Problems/Murmurs (AVR) AS  Rhythm:Regular Rate:Normal     Neuro/Psych PSYCHIATRIC DISORDERS Depression    GI/Hepatic negative GI ROS, Neg liver ROS,   Endo/Other   Hypothyroidism   Renal/GU ESRF and DialysisRenal disease     Musculoskeletal  (+) Arthritis ,   Abdominal   Peds  Hematology  (+) anemia ,   Anesthesia Other Findings   Reproductive/Obstetrics                            Anesthesia Physical Anesthesia Plan  ASA: 3  Anesthesia Plan: MAC   Post-op Pain Management: Minimal or no pain anticipated   Induction:   PONV Risk Score and Plan:   Airway Management Planned: Nasal Cannula and Natural Airway  Additional Equipment:   Intra-op Plan:   Post-operative Plan:   Informed Consent: I have reviewed the patients History and Physical, chart, labs and discussed the procedure including the risks, benefits and alternatives for the proposed anesthesia with the patient or authorized representative who has indicated his/her understanding and acceptance.     Dental advisory given  Plan Discussed with: CRNA and Surgeon  Anesthesia Plan Comments:         Anesthesia Quick Evaluation  Anesthesia Physical Anesthesia Plan  ASA: 3  Anesthesia Plan: MAC   Post-op Pain Management:    Induction:   PONV Risk Score and Plan:   Airway Management Planned:   Additional Equipment:   Intra-op Plan:   Post-operative Plan:   Informed Consent: I have reviewed the patients History and Physical, chart, labs and discussed the procedure including the risks, benefits and alternatives for the proposed anesthesia with the patient or authorized representative who has indicated his/her understanding and acceptance.     Dental Advisory Given  Plan Discussed with: CRNA  Anesthesia Plan Comments:        Anesthesia Quick Evaluation

## 2021-06-27 NOTE — Anesthesia Postprocedure Evaluation (Signed)
Anesthesia Post Note  Patient: [name]  Procedure(s) Performed: CATARACT EXTRACTION PHACO AND INTRAOCULAR LENS PLACEMENT (IOC) WITH IACCESS GONIOTOMY (Left: Eye)  Patient location during evaluation: Short Stay Anesthesia Type: General Level of consciousness: awake and alert Pain management: pain level controlled Vital Signs Assessment: post-procedure vital signs reviewed and stable Respiratory status: spontaneous breathing Cardiovascular status: blood pressure returned to baseline and stable Postop Assessment: no apparent nausea or vomiting Anesthetic complications: no   No notable events documented.   Last Vitals:  Vitals:   06/27/21 1202 06/27/21 1437  BP: 128/88   Pulse: 90 66  Resp: 16 16  Temp: 36.7 C 36.7 C  SpO2: 98% 99%    Last Pain:  Vitals:   06/27/21 1437  TempSrc: Oral  PainSc: 0-No pain                 Naiyana Barbian

## 2021-07-02 ENCOUNTER — Encounter (HOSPITAL_COMMUNITY): Payer: Self-pay | Admitting: Ophthalmology

## 2021-07-30 ENCOUNTER — Ambulatory Visit (HOSPITAL_COMMUNITY)
Admission: RE | Admit: 2021-07-30 | Discharge: 2021-07-30 | Disposition: A | Discharge status: Home / Self Care | Payer: Managed Care, Other (non HMO) | Source: Ambulatory Visit | Attending: Family Medicine | Admitting: Family Medicine

## 2021-07-30 ENCOUNTER — Other Ambulatory Visit: Payer: Self-pay

## 2021-07-30 ENCOUNTER — Other Ambulatory Visit (HOSPITAL_COMMUNITY): Payer: Self-pay | Admitting: Family Medicine

## 2021-07-30 DIAGNOSIS — R0600 Dyspnea, unspecified: Secondary | ICD-10-CM | POA: Diagnosis not present

## 2021-09-18 DIAGNOSIS — Z953 Presence of xenogenic heart valve: Secondary | ICD-10-CM | POA: Insufficient documentation

## 2021-09-24 ENCOUNTER — Encounter: Payer: Self-pay | Admitting: *Deleted

## 2021-09-25 ENCOUNTER — Ambulatory Visit (INDEPENDENT_AMBULATORY_CARE_PROVIDER_SITE_OTHER): Payer: Managed Care, Other (non HMO)

## 2021-09-25 ENCOUNTER — Encounter: Payer: Self-pay | Admitting: Cardiology

## 2021-09-25 ENCOUNTER — Other Ambulatory Visit: Payer: Self-pay | Admitting: Cardiology

## 2021-09-25 ENCOUNTER — Ambulatory Visit (INDEPENDENT_AMBULATORY_CARE_PROVIDER_SITE_OTHER): Payer: Managed Care, Other (non HMO) | Admitting: Cardiology

## 2021-09-25 VITALS — BP 118/58 | HR 63 | Ht 68.0 in | Wt 265.6 lb

## 2021-09-25 DIAGNOSIS — I483 Typical atrial flutter: Secondary | ICD-10-CM | POA: Diagnosis not present

## 2021-09-25 DIAGNOSIS — I059 Rheumatic mitral valve disease, unspecified: Secondary | ICD-10-CM

## 2021-09-25 DIAGNOSIS — I4892 Unspecified atrial flutter: Secondary | ICD-10-CM

## 2021-09-25 DIAGNOSIS — I25119 Atherosclerotic heart disease of native coronary artery with unspecified angina pectoris: Secondary | ICD-10-CM

## 2021-09-25 DIAGNOSIS — Z953 Presence of xenogenic heart valve: Secondary | ICD-10-CM | POA: Diagnosis not present

## 2021-09-25 NOTE — Progress Notes (Signed)
? ? ?Cardiology Office Note ? ?Date: 09/25/2021  ? ?ID: [name], DOB Sep 23, 1945, MRN [id number] ? ?PCP:  Celene Squibb, MD  ?Cardiologist:  Rozann Lesches, MD ?Electrophysiologist:  None  ? ?Chief Complaint  ?Patient presents with  ? Cardiac follow-up  ? ? ?History of Present Illness: ?[name] is a 76 y.o. male last seen in October 2022 by Mr. Leonides Sake NP.  He is here today with family member for a follow-up visit.  Per discussion and record review I see that he was recently hospitalized at [facility] having presented with uremic encephalopathy and hypotension in the setting of ESRD on peritoneal dialysis.  He had been having trouble with balance and death perception, also leg weakness and decreased appetite.  Peripheral neuropathy also becoming more of a problem.  He was admitted for further evaluation, evaluated by nephrology, assessed by PT/OT with recommendation for outpatient treatment, treated for possible E. coli UTI, taken off baseline antihypertensives with improvement in blood pressure, also found to be in reported atrial flutter with variable conduction by initial ECG.  Unfortunately, I cannot see the actual tracing but this was the interpretation, heart rate in the 60s (described as poor quality tracing).  Unclear whether this resolved spontaneously or not.  He was not felt to be a candidate for anticoagulation given prior history of GI bleeding while on anticoagulation for treatment of pulmonary embolus.  He did have a follow-up echocardiogram which is noted below. ? ?I reviewed his medications which are noted below. ? ?I personally reviewed his ECG today which shows typical atrial flutter with variable conduction and heart rate 65 bpm. ? ?Past Medical History:  ?Diagnosis Date  ? Aortic stenosis   ? Status post AVR, 25 mm Edwards pericardial Magna-Ease valve 2014  ? Arthritis   ? CAD (coronary artery disease)   ? Status post SVG to OM 2014  ? COPD (chronic obstructive pulmonary disease) ([place])   ?  Depression   ? Dialysis patient [facility])   ? on PD 7 night weekly at home  ? ESRD on peritoneal dialysis [facility])   ? Essential hypertension   ? Gout   ? Hemorrhoid   ? Hypothyroidism   ? Kidney stones   ? Mixed hyperlipidemia   ? Orthostatic hypotension   ? Pulmonary emboli ([place]) 08/21/2020  ? Syncope   ? ? ?Past Surgical History:  ?Procedure Laterality Date  ? ANGIOPASTY    ? AORTIC VALVE REPLACEMENT  07/11/2012  ? Procedure: AORTIC VALVE REPLACEMENT (AVR);  Surgeon: Gaye Pollack, MD;  Location: [place];  Service: Open Heart Surgery;  Laterality: N/A;  ? AV FISTULA PLACEMENT Left 05/30/2020  ? Procedure: ARTERIOVENOUS (AV) FISTULA CREATION LEFT;  Surgeon: [name], MD;  [facility]: [facility];  Service: Vascular;  Laterality: Left;  ? CARDIAC CATHETERIZATION    ? CAROTID ENDARTERECTOMY Left   ? CATARACT EXTRACTION W/PHACO Right 06/09/2021  ? Procedure: CATARACT EXTRACTION RIGHT EYE W/ PHACO AND INTRAOCULAR LENS PLACEMENT (IOC);  Surgeon: Baruch Goldmann, MD;  [facility]: [facility];  Service: Ophthalmology;  Laterality: Right;  CDE: 12.97  ? CATARACT EXTRACTION W/PHACO Left 06/27/2021  ? Procedure: CATARACT EXTRACTION PHACO AND INTRAOCULAR LENS PLACEMENT ([place]) WITH IACCESS GONIOTOMY;  Surgeon: Baruch Goldmann, MD;  [facility]: [facility];  Service: Ophthalmology;  Laterality: Left;  CDE: 10.08  ? CIRCUMCISION    ? COLONOSCOPY WITH PROPOFOL N/A 07/16/2020  ? Procedure: COLONOSCOPY WITH PROPOFOL;  Surgeon: Doran Stabler, MD;  Location: [place];  Service: Gastroenterology;  Laterality: N/A;  ? CORONARY ARTERY BYPASS GRAFT  07/11/2012  ? Procedure: CORONARY ARTERY BYPASS GRAFTING (CABG);  Surgeon: Gaye Pollack, MD;  Location: [place];  Service: Open Heart Surgery;  Laterality: N/A;  CABG x one,  using right leg greater saphenous vein harvested endoscopically  ? ESOPHAGOGASTRODUODENOSCOPY (EGD) WITH PROPOFOL N/A 07/15/2020  ? Procedure: ESOPHAGOGASTRODUODENOSCOPY (EGD) WITH PROPOFOL;  Surgeon: Doran Stabler, MD;  Location:  [place];  Service: Gastroenterology;  Laterality: N/A;  ? HOT HEMOSTASIS N/A 07/16/2020  ? Procedure: HOT HEMOSTASIS (ARGON PLASMA COAGULATION/BICAP);  Surgeon: Doran Stabler, MD;  Location: [place];  Service: Gastroenterology;  Laterality: N/A;  ? INSERTION OF ANTERIOR SEGMENT AQUEOUS DRAINAGE DEVICE (ISTENT) Right 06/09/2021  ? Procedure: INSERTION OF RIGHT EYE ANTERIOR SEGMENT AQUEOUS DRAINAGE DEVICE (ISTENT);  Surgeon: Baruch Goldmann, MD;  [facility];  Service: Ophthalmology;  Laterality: Right;  CDE: 12.97  ? INTRAOPERATIVE TRANSESOPHAGEAL ECHOCARDIOGRAM  07/11/2012  ? Procedure: INTRAOPERATIVE TRANSESOPHAGEAL ECHOCARDIOGRAM;  Surgeon: Gaye Pollack, MD;  Location: [facility] OR;  Service: Open Heart Surgery;  Laterality: N/A;  ? IR PERC TUN PERIT CATH WO PORT S&I /IMAG  06/13/2020  ? IR REMOVAL TUN CV CATH W/O FL  07/26/2020  ? IR US GUIDE VASC ACCESS RIGHT  06/13/2020  ? JOINT REPLACEMENT Bilateral   ? knees  ? TEE WITHOUT CARDIOVERSION N/A 06/12/2020  ? Procedure: TRANSESOPHAGEAL ECHOCARDIOGRAM (TEE);  Surgeon: Skeet Latch, MD;  Location: [place];  Service: Cardiovascular;  Laterality: N/A;  ? ? ?Current Outpatient Medications  ?Medication Sig Dispense Refill  ? albuterol (PROVENTIL HFA;VENTOLIN HFA) 108 (90 BASE) MCG/ACT inhaler Inhale 2 puffs into the lungs every 6 (six) hours as needed for wheezing or shortness of breath.     ? allopurinol (ZYLOPRIM) 300 MG tablet Take 300 mg by mouth daily.    ? aspirin EC 81 MG tablet Take 81 mg by mouth daily. Swallow whole.    ? AURYXIA 1 GM 210 MG(Fe) tablet Take 210 mg by mouth 3 (three) times daily.    ? B Complex-C-Folic Acid (RENAL) 1 MG CAPS Take 1 capsule by mouth daily.    ? calcium acetate, Phos Binder, (PHOSLYRA) 667 MG/5ML SOLN Take by mouth 3 (three) times daily with meals.    ? docusate sodium (COLACE) 100 MG capsule Take 100 mg by mouth 2 (two) times daily as needed.    ? finasteride (PROSCAR) 5 MG tablet Take 5 mg by mouth  daily.    ? fluticasone (FLONASE) 50 MCG/ACT nasal spray Place 1 spray into both nostrils daily as needed.    ? ipratropium-albuterol (DUONEB) 0.5-2.5 (3) MG/3ML SOLN Take 3 mLs by nebulization every 6 (six) hours as needed (shortness of breath).    ? L-Lysine 1000 MG TABS Take 1 tablet by mouth daily.    ? levothyroxine (SYNTHROID) 175 MCG tablet Take 175 mcg by mouth daily before breakfast.     ? Multiple Vitamin (MULTIVITAMIN) tablet Take 1 tablet by mouth daily.    ? Omega-3 Fatty Acids (FISH OIL PO) Take 2 capsules by mouth daily.     ? pantoprazole (PROTONIX) 40 MG tablet Take 40 mg by mouth daily.    ? polyethylene glycol (MIRALAX / GLYCOLAX) 17 g packet Take 17 g by mouth daily. 14 each 0  ? sertraline (ZOLOFT) 50 MG tablet Take 50 mg by mouth daily.    ? spironolactone (ALDACTONE) 25 MG tablet Take 25 mg by mouth daily.    ?  torsemide (DEMADEX) 100 MG tablet Take 1 tablet by mouth 2 (two) times daily.    ? TRINTELLIX 20 MG TABS tablet Take 20 mg by mouth daily.    ? Cholecalciferol (VITAMIN D3) 50 MCG (2000 UT) capsule Take 2,000 Units by mouth See admin instructions. (Patient not taking: Reported on 09/25/2021)    ? gabapentin (NEURONTIN) 100 MG capsule Take 100 mg by mouth at bedtime. (Patient not taking: Reported on 09/25/2021)    ? metoprolol tartrate (LOPRESSOR) 25 MG tablet Take 0.5 tablets (12.5 mg total) by mouth 2 (two) times daily. (Patient not taking: Reported on 09/25/2021) 30 tablet 6  ? simvastatin (ZOCOR) 40 MG tablet Take 40 mg by mouth every evening. (Patient not taking: Reported on 09/25/2021)    ? ?No current facility-administered medications for this visit.  ? ?Allergies:  Patient has no known allergies.  ? ?ROS: No sense of palpitations.  No syncope. ? ?Physical Exam: ?VS:  BP (!) 118/58   Pulse 63   Ht '5\' 8"'$  (1.727 m)   Wt 265 lb 9.6 oz (120.5 kg)   SpO2 96%   BMI 40.38 kg/m? , BMI Body mass index is 40.38 kg/m?. ? ?Wt Readings from Last 3 Encounters:  ?09/25/21 265 lb 9.6 oz (120.5  kg)  ?06/20/21 255 lb 11.7 oz (116 kg)  ?06/03/21 255 lb 11.7 oz (116 kg)  ?  ?General: Patient appears comfortable at rest. ?HEENT: Conjunctiva and lids normal. ?Neck: Supple, no elevated JVP or carotid bruits, no t

## 2021-09-25 NOTE — Patient Instructions (Signed)
Medication Instructions:  ?Your physician recommends that you continue on your current medications as directed. Please refer to the Current Medication list given to you today. ? ?Labwork: ?none ? ?Testing/Procedures: ?ZIO- Long Term Monitor Instructions  ? ?Your physician has requested you wear your ZIO patch monitor 3 days.  ? ?This is a single patch monitor.  [employer] supplies one patch monitor per enrollment.  Additional stickers are not available. ?  ?Please do not apply patch if you will be having a Nuclear Stress Test, Echocardiogram, Cardiac CT, MRI, or Chest Xray during the time frame you would be wearing the monitor. The patch cannot be worn during these tests.  You cannot remove and re-apply the ZIO XT patch monitor. ?  ? ?  ?Once you have received you monitor, please review enclosed instructions.  Your monitor has already been registered assigning a specific monitor serial # to you. ? ? ?Applying the monitor  ? ?Shave hair from upper left chest. ?  ?Hold abrader disc by orange tab.  Rub abrader in 40 strokes over left upper chest as indicated in your monitor instructions. ?  ?Clean area with 4 enclosed alcohol pads .  Use all pads to assure are is cleaned thoroughly.  Let dry.  ? ?Apply patch as indicated in monitor instructions.  Patch will be place under collarbone on left side of chest with arrow pointing upward. ?  ?Rub patch adhesive wings for 2 minutes.Remove white label marked "1".  Remove white label marked "2".  Rub patch adhesive wings for 2 additional minutes. ?  ?While looking in a mirror, press and release button in center of patch.  A small [place] light will flash 3-4 times .  This will be your only indicator the monitor has been turned on. ?    ?Do not shower for the first 24 hours.  You may shower after the first 24 hours. ?  ?Press button if you feel a symptom. You will hear a small click.  Record Date, Time and Symptom in the Patient Log Book. ?  ?When you are ready to remove patch, follow  instructions on last 2 pages of Patient Log Book.  Stick patch monitor onto last page of Patient Log Book. ?  ?Place Patient Log Book in [facility] box.  Use locking tab on box and tape box closed securely.  The [employer] has [employer] on it.  Please place in mailbox as soon as possible.  Your physician should have your test results approximately 7 days after the monitor has been mailed back to [facility]. ?  ?Call [facility] at [phone] if you have questions regarding your ZIO XT patch monitor.  Call them immediately if you see an orange light blinking on your monitor. ?  ?If your monitor falls off in less than 4 days contact our Monitor department at [phone].  If your monitor becomes loose or falls off after 4 days call [employer] at [phone] for suggestions on securing your monitor. ? ?Follow-Up: ?Your physician recommends that you schedule a follow-up appointment in: 6 weeks ? ?Any Other Special Instructions Will Be Listed Below (If Applicable). ? ?If you need a refill on your cardiac medications before your next appointment, please call your pharmacy. ?

## 2021-10-03 ENCOUNTER — Telehealth: Payer: Self-pay | Admitting: Cardiology

## 2021-10-03 NOTE — Telephone Encounter (Signed)
[employer] calling to report zio results ?

## 2021-10-03 NOTE — Telephone Encounter (Signed)
Spoke with Raquel Sarna from [place]. She states that pt had A-flutter continuously. Slowest rate was 39 lasting 60 sec. Total wear time was 3 days.   ?

## 2021-10-06 DIAGNOSIS — E538 Deficiency of other specified B group vitamins: Secondary | ICD-10-CM | POA: Insufficient documentation

## 2021-10-14 ENCOUNTER — Other Ambulatory Visit: Payer: Self-pay | Admitting: *Deleted

## 2021-10-16 ENCOUNTER — Ambulatory Visit: Payer: Managed Care, Other (non HMO) | Admitting: Physician Assistant

## 2021-10-31 ENCOUNTER — Other Ambulatory Visit (HOSPITAL_COMMUNITY): Payer: Self-pay | Admitting: *Deleted

## 2021-10-31 ENCOUNTER — Encounter: Payer: Self-pay | Admitting: Nurse Practitioner

## 2021-10-31 ENCOUNTER — Ambulatory Visit (INDEPENDENT_AMBULATORY_CARE_PROVIDER_SITE_OTHER): Payer: Managed Care, Other (non HMO) | Admitting: Nurse Practitioner

## 2021-10-31 VITALS — BP 128/80 | HR 67 | Ht 68.0 in | Wt 248.6 lb

## 2021-10-31 DIAGNOSIS — Z86010 Personal history of colonic polyps: Secondary | ICD-10-CM | POA: Diagnosis not present

## 2021-10-31 DIAGNOSIS — R059 Cough, unspecified: Secondary | ICD-10-CM

## 2021-10-31 DIAGNOSIS — K5900 Constipation, unspecified: Secondary | ICD-10-CM | POA: Diagnosis not present

## 2021-10-31 DIAGNOSIS — I25119 Atherosclerotic heart disease of native coronary artery with unspecified angina pectoris: Secondary | ICD-10-CM

## 2021-10-31 DIAGNOSIS — R1310 Dysphagia, unspecified: Secondary | ICD-10-CM

## 2021-10-31 NOTE — Patient Instructions (Addendum)
Constipation ?Wife has put the miralax in a fruit smoothie since he was on a fluid restriction. This works well. Would do this once daily.  ?Glycerin suppositories as needed as we discussed today ?Continue stool softeners twice daily  ?Drink as much water as your kidney doctor will allow ( right now not on a fluid restriction) ? ?Follow up with Dr.Danis on 12/02/21 at 2:00pm ? ? ?You have been scheduled for a modified barium swallow on 11/11/21 at 11:30am. Please arrive 15 minutes prior to your test for registration. You will go to [place]: Entrance A ( for your appointment. Should you need to cancel or reschedule your appointment, please contact [phone] [place]) or [phone] [place]). ?_____________________________________________________________________ ?A Modified Barium Swallow Study, or MBS, is a special x-ray that is taken to check swallowing skills. It is carried out by a Stage manager and a Psychologist, clinical (SLP). During this test, yourmouth, throat, and esophagus, a muscular tube which connects your mouth to your stomach, is checked. The test will help you, your doctor, and the SLP plan what types of foods and liquids are easier for you to swallow. The SLP ?will also identify positions and ways to help you swallow more easily and safely. ?What will happen during an ?MBS? ?You will be taken to an x-ray room and seated comfortably. You will be asked to swallow small amounts of food and liquid mixed with barium. Barium is a liquid or paste that allows images of your mouth, throat and esophagus to be seen on x-ray. The x-ray captures moving images of the food you are swallowing as it travels from your mouth ?through your throat and into your esophagus. This test helps identify whether food or liquid is entering your lungs (aspiration). The test also shows which part of your mouth or throat lacks strength or coordination to move the food or liquid in the right  direction. This test typically takes 30 minutes to 1 hour to complete. ?_______________________________________________________ ? ?Thank you for choosing me and [facility]. ? ? ? ?

## 2021-10-31 NOTE — Progress Notes (Signed)
? ? ?Assessment  ? ?Patient Profile:  ?[name] is a 76 y.o. male known to Dr. Loletha Carrow. He has multiple medical problems not limited to ESRD on PD,  DVT / PE, MGUS, hypothyroidism, gout, kidney stones, aortic stenosis, s/p AVR ( [place]) COPD, peripheral neuropathy.  Additional medical history as listed in [place].  ? ?Constipation. Hard stools / difficult evacuation ? ?Coughing / "choking" on liquid and solids.  ?Per wife patient had an EGD during recent admission to [facility]. I cannot see results in Care Everywhere  ? ?Colon polyps.  ?Several polyps found at time of colonoscopy in January 2022 performed for a GI bleed on anticoagulation. Polyps were not removed ? 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  ?Likely secondary to chronic disease / CKD. No overt GI bleeding since hospitalization Jan 2022 ? ? ?Plan  ? ?Wife has put miralax in a fruit smoothie when patient was on a fluid restriction. This works well. Would do this once daily.  ?Glycerin suppositories as needed  ?Continue stool softeners BID ?Drink as much water as Nephrology will allow you too ( right now not on a fluid restriction) ?Obtain EGD report from [place] ?MBSS for what sounds to be pharyngeal dysphagia ?Follow up with Dr. Loletha Carrow after MBSS.  ?I don't know that there are any plans to remove the colon polyps but will defer to Dr. Loletha Carrow. Patient is at high risk for procedures.   ? ? ?HPI  ? ?Chief Complaint :  Coughing / choking with solids and fluids. Recent inpatient EGD for these symptoms. Constipation.  ? ?[name] was in the hospital in January 2022 with melena on anticoagulation. EGD was normal. We treated a bleeding AVM in right colon on colonoscopy.  Several polyps were found but left in place. We have not seen him since. Patient says he is here because his Nephrologist wanted him to follow up on colon polyps. Also, he scheduled this appointment because of dry heaving which has now resolved.  He was taken off anticoagulation last June.  ? ?Interval History ?Admitted to  [place] late March with dehydration, elevated WBC UTI, uremic encephalopathy, hypotension. He was having dry heaves at home prior to admission but that has resolved. In the hospital he started having difficulty swallowing. He and wife describe choking / coughing with both solids and liquids. No dysphagia. No odynophagia. He says that he has no history of GERD ( but is taking protonix). Apparently had an EGD at [facility] while hospitalized.  ? ?[name] is constipated. He has been taking two stool softeners twice a day for a couple of months. He gets urge to have a BM about every other day. Stools are hard and wife doesn't think he has the strength to push the stool out from rectum.  Miralax and colace in hospital worked well. A few times wife has given him miralax at home but doesn't know who often he can have it. He is not on a fluid restriction right now due to recent admission for dehydration but normally is limited to 32 oz fluid / day ? ?When on fluid restriction wife has put the miralax in a fruit smoothie which has worked well.   ? ?Laboratory data: ? ?  Latest Ref Rng & Units 07/14/2020  ? 12:21 PM 06/13/2020  ?  3:26 AM 06/12/2020  ?  4:42 AM  ?Hepatic Function  ?Total Protein 6.5 - 8.1 g/dL 5.8      ?Albumin 3.5 - 5.0 g/dL 2.1   2.5  2.5    ?AST 15 - 41 U/L 26      ?ALT 0 - 44 U/L 15      ?Alk Phosphatase 38 - 126 U/L 66      ?Total Bilirubin 0.3 - 1.2 mg/dL 0.4      ? ? ? ?  Latest Ref Rng & Units 06/27/2021  ? 12:11 PM 06/09/2021  ? 11:19 AM 07/23/2020  ?  1:37 PM  ?CBC  ?WBC 4.0 - 10.5 K/uL   9.1    ?Hemoglobin 13.0 - 17.0 g/dL 12.2   12.9   8.2    ?Hematocrit 39.0 - 52.0 % 36.0   38.0   28.0    ?Platelets 150 - 400 K/uL   250    ? ? ?Imaging:  ?09/17/21 US spleen ?--normal.  ? ?09/19/21 Echocardiogram  ?INTERPRETATION ---------------------------------------------------------------  ?  NORMAL LEFT VENTRICULAR SYSTOLIC FUNCTION WITH MILD LVH  ?  MILD RV SYSTOLIC DYSFUNCTION (See above)  ?  VALVULAR REGURGITATION:  MILD [place], MODERATE TR  ?  VALVULAR STENOSIS: MODERATE MS  ?  ELEVATED GRADIENTS ACROSS THE BIOPROSTHETIC AORTIC VALVE. (AVA = 0.7cm2)  ?  RVSP IS ATLEAST 36mHg PLUS RIGHT ATRIAL PRESSURE.  ?  NO PRIOR STUDY FOR COMPARISON   ? ?Previous GI Evaluation  ? ?Jan 2022 colonoscopy  ?-The examined portion of the ileum was normal. ?- A few bleeding colonic angioectasias. Treated with argon plasma coagulation (APC). ?- Multiple small polyps in the entire colon. ?- Internal hemorrhoids. ?- The examination was otherwise normal on direct and retroflexion views. ?- No specimens collected. ? ? ?Past Medical History:  ?Diagnosis Date  ? Aortic stenosis   ? Status post AVR, 25 mm Edwards pericardial Magna-Ease valve 2014  ? Arthritis   ? CAD (coronary artery disease)   ? Status post SVG to OM 2014  ? COPD (chronic obstructive pulmonary disease) ([place]   ? Depression   ? Dialysis patient ([place]   ? on PD 7 night weekly at home  ? ESRD on peritoneal dialysis ([facility]   ? Essential hypertension   ? Gout   ? Hemorrhoid   ? Hypertension   ? Hypothyroidism   ? Kidney stones   ? Lumbar spondylolysis   ? Mixed hyperlipidemia   ? Orthostatic hypotension   ? Osteoarthritis   ? Pulmonary emboli ([place] 08/21/2020  ? Syncope   ? Vitamin D deficiency   ? ? ?Past Surgical History:  ?Procedure Laterality Date  ? ANGIOPASTY    ? AORTIC VALVE REPLACEMENT  07/11/2012  ? Procedure: AORTIC VALVE REPLACEMENT (AVR);  Surgeon: BGaye Pollack MD;  Location[place]  Service: Open Heart Surgery;  Laterality: N/A;  ? AV FISTULA PLACEMENT Left 05/30/2020  ? Procedure: ARTERIOVENOUS (AV) FISTULA CREATION LEFT;  Surgeon: ERosetta Posner MD;  [facility];  Service: Vascular;  Laterality: Left;  ? CARDIAC CATHETERIZATION    ? CAROTID ENDARTERECTOMY Left   ? CATARACT EXTRACTION W/PHACO Right 06/09/2021  ? Procedure: CATARACT EXTRACTION RIGHT EYE W/ PHACO AND INTRAOCULAR LENS PLACEMENT (IOC);  Surgeon: WBaruch Goldmann MD;  [facility]: [facility];  Service: Ophthalmology;   Laterality: Right;  CDE: 12.97  ? CATARACT EXTRACTION W/PHACO Left 06/27/2021  ? Procedure: CATARACT EXTRACTION PHACO AND INTRAOCULAR LENS PLACEMENT ([place] WITH IACCESS GONIOTOMY;  Surgeon: WBaruch Goldmann MD;  Location: [facility];  Service: Ophthalmology;  Laterality: Left;  CDE: 10.08  ? CIRCUMCISION    ? COLONOSCOPY WITH PROPOFOL N/A 07/16/2020  ? Procedure: COLONOSCOPY WITH  PROPOFOL;  Surgeon: Doran Stabler, MD;  Location: [place];  Service: Gastroenterology;  Laterality: N/A;  ? CORONARY ARTERY BYPASS GRAFT  07/11/2012  ? Procedure: CORONARY ARTERY BYPASS GRAFTING (CABG);  Surgeon: Gaye Pollack, MD;  Location: [place];  Service: Open Heart Surgery;  Laterality: N/A;  CABG x one,  using right leg greater saphenous vein harvested endoscopically  ? ESOPHAGOGASTRODUODENOSCOPY (EGD) WITH PROPOFOL N/A 07/15/2020  ? Procedure: ESOPHAGOGASTRODUODENOSCOPY (EGD) WITH PROPOFOL;  Surgeon: Doran Stabler, MD;  Location: [place];  Service: Gastroenterology;  Laterality: N/A;  ? HOT HEMOSTASIS N/A 07/16/2020  ? Procedure: HOT HEMOSTASIS (ARGON PLASMA COAGULATION/BICAP);  Surgeon: Doran Stabler, MD;  Location: [place];  Service: Gastroenterology;  Laterality: N/A;  ? INSERTION OF ANTERIOR SEGMENT AQUEOUS DRAINAGE DEVICE (ISTENT) Right 06/09/2021  ? Procedure: INSERTION OF RIGHT EYE ANTERIOR SEGMENT AQUEOUS DRAINAGE DEVICE (ISTENT);  Surgeon: Baruch Goldmann, MD;  [facility]: [facility];  Service: Ophthalmology;  Laterality: Right;  CDE: 12.97  ? INTRAOPERATIVE TRANSESOPHAGEAL ECHOCARDIOGRAM  07/11/2012  ? Procedure: INTRAOPERATIVE TRANSESOPHAGEAL ECHOCARDIOGRAM;  Surgeon: Gaye Pollack, MD;  Location: [facility] OR;  Service: Open Heart Surgery;  Laterality: N/A;  ? IR PERC TUN PERIT CATH WO PORT S&I /IMAG  06/13/2020  ? IR REMOVAL TUN CV CATH W/O FL  07/26/2020  ? IR US GUIDE VASC ACCESS RIGHT  06/13/2020  ? JOINT REPLACEMENT Bilateral   ? knees  ? TEE WITHOUT CARDIOVERSION N/A 06/12/2020  ? Procedure:  TRANSESOPHAGEAL ECHOCARDIOGRAM (TEE);  Surgeon: Skeet Latch, MD;  Location: [place];  Service: Cardiovascular;  Laterality: N/A;  ? ? ?Current Medications, Allergies, Family History and Social History were

## 2021-11-03 NOTE — Progress Notes (Signed)
____________________________________________________________ ? ?Attending physician addendum: ? ?Thank you for sending this case to me. ?I have reviewed the entire note and agree with the plan. ? ?This constipation is likely related to chronic medical conditions and debility with side effects of diuretics and oral iron.  (IV iron with nephrology would be preferable to oral iron for him) ?Therefore MiraLAX is a good choice of therapy for him. ?Also, I believe the risks of repeat colonoscopy with polypectomy outweigh the expected benefits removing the small benign-appearing polyp seen on colonoscopy earlier this year. ? ?Wilfrid Lund, MD ? ?____________________________________________________________ ? ?

## 2021-11-06 ENCOUNTER — Other Ambulatory Visit: Payer: Self-pay | Admitting: Neurology

## 2021-11-06 DIAGNOSIS — R4189 Other symptoms and signs involving cognitive functions and awareness: Secondary | ICD-10-CM

## 2021-11-06 DIAGNOSIS — R0989 Other specified symptoms and signs involving the circulatory and respiratory systems: Secondary | ICD-10-CM

## 2021-11-06 NOTE — Progress Notes (Signed)
? ?Cardiology Office Note   ? ?Date:  11/07/2021  ? ?ID:  [name], DOB Jan 22, 1946, MRN [id number] ? ?PCP:  Celene Squibb, MD  ?Cardiologist: Rozann Lesches, MD   ? ?Chief Complaint  ?Patient presents with  ? Follow-up  ?  6 week visit  ? ? ?History of Present Illness:   ? ?[name] is a 76 y.o. male with past medical history of CAD (s/p SVG to OM in 2014), aortic stenosis (s/p AVR in 2014 with Edwards pericardial valve), HTN, HLD, ESRD and history of PE who presents to the office today for 6-week follow-up. ? ?He was last examined by Dr. Domenic Polite in 08/2021 and had recently been hospitalized at [facility] for uremic encephalopathy and hypotension in the setting of ESRD. He reported worsening balance issues along with a decreased appetite. Apparently there was concern that he had been having episodes of atrial flutter during admission but his actual tracings were not available for review. A repeat tracing was obtained and showed that he was in typical atrial flutter with variable conduction. He was not felt to be a good candidate for long-term anticoagulation given prior issues with GI bleeding while on anticoagulation and was also not an optimal candidate for Watchman device given his comorbidities. A 72-hour Zio patch was placed to follow his heart rate. This showed his predominant rhythm was atrial flutter with variable conduction and his average heart rate was at 51 bpm and ranged from 37 to 90 bpm. Dr. Domenic Polite recommended remaining off AV nodal blocking agents for now given his slower conduction. ? ?In talking with the patient and his wife today, he reports overall doing well from a cardiac perspective since his last visit. He is having more issues with solid food dysphagia and is being followed by GI with plans for a barium swallow study. Also reports more frequent falls in the setting of neuropathy and a Brain MRI was ordered for further evaluation but has not yet been performed. No recent chest pain or  palpitations. He is not overly active at baseline but no progressive dyspnea on exertion, orthopnea, PND or pitting edema. His wife has been assisting with PD and says his dry weight has been stable.  ? ?Past Medical History:  ?Diagnosis Date  ? Aortic stenosis   ? Status post AVR, 25 mm Edwards pericardial Magna-Ease valve 2014  ? Arthritis   ? CAD (coronary artery disease)   ? Status post SVG to OM 2014  ? COPD (chronic obstructive pulmonary disease) ([place])   ? Depression   ? Dialysis patient [facility])   ? on PD 7 night weekly at home  ? ESRD on peritoneal dialysis [facility])   ? Essential hypertension   ? Gout   ? Hemorrhoid   ? Hypertension   ? Hypothyroidism   ? Kidney stones   ? Lumbar spondylolysis   ? Mixed hyperlipidemia   ? Orthostatic hypotension   ? Osteoarthritis   ? Pulmonary emboli ([place]) 08/21/2020  ? Syncope   ? Vitamin D deficiency   ? ? ?Past Surgical History:  ?Procedure Laterality Date  ? ANGIOPASTY    ? AORTIC VALVE REPLACEMENT  07/11/2012  ? Procedure: AORTIC VALVE REPLACEMENT (AVR);  Surgeon: Gaye Pollack, MD;  Location: [place];  Service: Open Heart Surgery;  Laterality: N/A;  ? AV FISTULA PLACEMENT Left 05/30/2020  ? Procedure: ARTERIOVENOUS (AV) FISTULA CREATION LEFT;  Surgeon: [name], MD;  Location: [facility];  Service: Vascular;  Laterality:  Left;  ? CARDIAC CATHETERIZATION    ? CAROTID ENDARTERECTOMY Left   ? CATARACT EXTRACTION W/PHACO Right 06/09/2021  ? Procedure: CATARACT EXTRACTION RIGHT EYE W/ PHACO AND INTRAOCULAR LENS PLACEMENT (IOC);  Surgeon: Baruch Goldmann, MD;  Location: [facility];  Service: Ophthalmology;  Laterality: Right;  CDE: 12.97  ? CATARACT EXTRACTION W/PHACO Left 06/27/2021  ? Procedure: CATARACT EXTRACTION PHACO AND INTRAOCULAR LENS PLACEMENT ([place]) WITH IACCESS GONIOTOMY;  Surgeon: Baruch Goldmann, MD;  Location: [facility];  Service: Ophthalmology;  Laterality: Left;  CDE: 10.08  ? CIRCUMCISION    ? COLONOSCOPY WITH PROPOFOL N/A 07/16/2020  ? Procedure: COLONOSCOPY WITH  PROPOFOL;  Surgeon: Doran Stabler, MD;  Location: [place];  Service: Gastroenterology;  Laterality: N/A;  ? CORONARY ARTERY BYPASS GRAFT  07/11/2012  ? Procedure: CORONARY ARTERY BYPASS GRAFTING (CABG);  Surgeon: Gaye Pollack, MD;  Location: [place];  Service: Open Heart Surgery;  Laterality: N/A;  CABG x one,  using right leg greater saphenous vein harvested endoscopically  ? ESOPHAGOGASTRODUODENOSCOPY (EGD) WITH PROPOFOL N/A 07/15/2020  ? Procedure: ESOPHAGOGASTRODUODENOSCOPY (EGD) WITH PROPOFOL;  Surgeon: Doran Stabler, MD;  Location: [place];  Service: Gastroenterology;  Laterality: N/A;  ? HOT HEMOSTASIS N/A 07/16/2020  ? Procedure: HOT HEMOSTASIS (ARGON PLASMA COAGULATION/BICAP);  Surgeon: Doran Stabler, MD;  Location: [place];  Service: Gastroenterology;  Laterality: N/A;  ? INSERTION OF ANTERIOR SEGMENT AQUEOUS DRAINAGE DEVICE (ISTENT) Right 06/09/2021  ? Procedure: INSERTION OF RIGHT EYE ANTERIOR SEGMENT AQUEOUS DRAINAGE DEVICE (ISTENT);  Surgeon: Baruch Goldmann, MD;  Location: [facility];  Service: Ophthalmology;  Laterality: Right;  CDE: 12.97  ? INTRAOPERATIVE TRANSESOPHAGEAL ECHOCARDIOGRAM  07/11/2012  ? Procedure: INTRAOPERATIVE TRANSESOPHAGEAL ECHOCARDIOGRAM;  Surgeon: Gaye Pollack, MD;  Location: [facility] OR;  Service: Open Heart Surgery;  Laterality: N/A;  ? IR PERC TUN PERIT CATH WO PORT S&I /IMAG  06/13/2020  ? IR REMOVAL TUN CV CATH W/O FL  07/26/2020  ? IR US GUIDE VASC ACCESS RIGHT  06/13/2020  ? JOINT REPLACEMENT Bilateral   ? knees  ? TEE WITHOUT CARDIOVERSION N/A 06/12/2020  ? Procedure: TRANSESOPHAGEAL ECHOCARDIOGRAM (TEE);  Surgeon: Skeet Latch, MD;  Location: [place];  Service: Cardiovascular;  Laterality: N/A;  ? ? ?Current Medications: ?Outpatient Medications Prior to Visit  ?Medication Sig Dispense Refill  ? albuterol (PROVENTIL HFA;VENTOLIN HFA) 108 (90 BASE) MCG/ACT inhaler Inhale 2 puffs into the lungs every 6 (six) hours as needed for wheezing or  shortness of breath.     ? aspirin EC 81 MG tablet Take 81 mg by mouth daily. Swallow whole.    ? AURYXIA 1 GM 210 MG(Fe) tablet Take 210 mg by mouth 3 (three) times daily.    ? calcium acetate, Phos Binder, (PHOSLYRA) 667 MG/5ML SOLN Take by mouth 3 (three) times daily with meals.    ? Cyanocobalamin (B-12 COMPLIANCE INJECTION IJ) Inject as directed.    ? docusate sodium (COLACE) 100 MG capsule Take 100 mg by mouth 2 (two) times daily as needed.    ? finasteride (PROSCAR) 5 MG tablet Take 5 mg by mouth daily.    ? ipratropium-albuterol (DUONEB) 0.5-2.5 (3) MG/3ML SOLN Take 3 mLs by nebulization every 6 (six) hours as needed (shortness of breath).    ? L-Lysine 1000 MG TABS Take 1 tablet by mouth daily.    ? levothyroxine (SYNTHROID) 175 MCG tablet Take 175 mcg by mouth daily before breakfast.     ? Multiple Vitamin (MULTIVITAMIN) tablet Take 1 tablet  by mouth daily.    ? Omega-3 Fatty Acids (FISH OIL PO) Take 2 capsules by mouth daily.     ? pantoprazole (PROTONIX) 40 MG tablet Take 40 mg by mouth daily.    ? polyethylene glycol (MIRALAX / GLYCOLAX) 17 g packet Take 17 g by mouth daily. 14 each 0  ? sertraline (ZOLOFT) 50 MG tablet Take 50 mg by mouth daily.    ? spironolactone (ALDACTONE) 25 MG tablet Take 25 mg by mouth daily.    ? torsemide (DEMADEX) 100 MG tablet Take 1 tablet by mouth 2 (two) times daily.    ? TRINTELLIX 20 MG TABS tablet Take 20 mg by mouth daily.    ? fluticasone (FLONASE) 50 MCG/ACT nasal spray Place 1 spray into both nostrils daily as needed. (Patient not taking: Reported on 10/31/2021)    ? allopurinol (ZYLOPRIM) 300 MG tablet Take 300 mg by mouth daily.    ? simvastatin (ZOCOR) 40 MG tablet Take 40 mg by mouth every evening. (Patient not taking: Reported on 11/07/2021)    ? ?No facility-administered medications prior to visit.  ?  ? ?Allergies:   Patient has no known allergies.  ? ?Social History  ? ?Socioeconomic History  ? Marital status: Married  ?  Spouse name: Not on file  ? Number of  children: 1  ? Years of education: HS  ? Highest education level: Not on file  ?Occupational History  ? Occupation: Retired   ?Tobacco Use  ? Smoking status: Former  ?  Packs/day: 3.00  ?  Years: 50.00  ?  Pack

## 2021-11-07 ENCOUNTER — Encounter: Payer: Self-pay | Admitting: Student

## 2021-11-07 ENCOUNTER — Ambulatory Visit (INDEPENDENT_AMBULATORY_CARE_PROVIDER_SITE_OTHER): Payer: Managed Care, Other (non HMO) | Admitting: Student

## 2021-11-07 VITALS — BP 120/80 | HR 51 | Ht 68.0 in | Wt 259.0 lb

## 2021-11-07 DIAGNOSIS — I25119 Atherosclerotic heart disease of native coronary artery with unspecified angina pectoris: Secondary | ICD-10-CM

## 2021-11-07 DIAGNOSIS — I483 Typical atrial flutter: Secondary | ICD-10-CM | POA: Diagnosis not present

## 2021-11-07 DIAGNOSIS — Z953 Presence of xenogenic heart valve: Secondary | ICD-10-CM

## 2021-11-07 DIAGNOSIS — E785 Hyperlipidemia, unspecified: Secondary | ICD-10-CM

## 2021-11-07 DIAGNOSIS — I2510 Atherosclerotic heart disease of native coronary artery without angina pectoris: Secondary | ICD-10-CM | POA: Diagnosis not present

## 2021-11-07 DIAGNOSIS — I059 Rheumatic mitral valve disease, unspecified: Secondary | ICD-10-CM | POA: Diagnosis not present

## 2021-11-07 MED ORDER — SIMVASTATIN 40 MG PO TABS: 40.0000 mg | ORAL_TABLET | Freq: Every evening | ORAL | 6 refills | Status: DC

## 2021-11-07 NOTE — Patient Instructions (Addendum)
Medication Instructions:  ?Restart Simvastatin 40 mg tablets ? ?Labwork: ?None ? ?Testing/Procedures: ?None ? ?Follow-Up: ?Follow up with Dr. Domenic Polite in 6 months.  ? ?Any Other Special Instructions Will Be Listed Below (If Applicable). ? ? ? ? ?If you need a refill on your cardiac medications before your next appointment, please call your pharmacy. ? ?

## 2021-11-11 ENCOUNTER — Ambulatory Visit (HOSPITAL_COMMUNITY): Payer: Medicare Other

## 2021-11-11 ENCOUNTER — Encounter (HOSPITAL_COMMUNITY): Payer: Medicare Other

## 2021-11-13 ENCOUNTER — Ambulatory Visit (HOSPITAL_COMMUNITY)
Admission: RE | Admit: 2021-11-13 | Discharge: 2021-11-13 | Disposition: A | Discharge status: Home / Self Care | Payer: Managed Care, Other (non HMO) | Source: Ambulatory Visit | Attending: Nurse Practitioner | Admitting: Nurse Practitioner

## 2021-11-13 DIAGNOSIS — R1310 Dysphagia, unspecified: Secondary | ICD-10-CM | POA: Insufficient documentation

## 2021-11-13 DIAGNOSIS — K5900 Constipation, unspecified: Secondary | ICD-10-CM | POA: Insufficient documentation

## 2021-11-13 DIAGNOSIS — R059 Cough, unspecified: Secondary | ICD-10-CM | POA: Diagnosis not present

## 2021-11-13 DIAGNOSIS — Z86010 Personal history of colonic polyps: Secondary | ICD-10-CM | POA: Insufficient documentation

## 2021-11-13 NOTE — Progress Notes (Signed)
Modified Barium Swallow Progress Note  Patient Details  Name: [name] MRN: [id number] Date of Birth: [birth year]  Today's Date: 11/13/2021  Modified Barium Swallow completed.  Full report located under Chart Review in the Imaging Section.  Brief recommendations include the following:  Clinical Impression  Pt demonstrated min-mild oropharyngeal dysphagia with laryngeal penetration. Reduced oral cohesion led to anterior sulcus residue, minimal lingual residue spilled into vallecuale. Laryngeal closure was reached but late with liquid reaching the pyriform sinuses and penetrated into laryngeal vestibule (PAS 3) on anterior vestibule wall. Pt was instructed on chin tuck technique that consistently prevented material from entering airway during study.Minimal vallecular residue observed. Esophageal sweep was unremarkable although not diagnosed with MBS. Pt was instructed to continue with chin tuck strategy with all liquids and provided written information. Recommend continue regular texture as pt able to masticate, thin liquids, pills whole in puree.   Swallow Evaluation Recommendations       SLP Diet Recommendations: Regular solids;Thin liquid   Liquid Administration via: Cup;No straw   Medication Administration: Whole meds with puree   Supervision: Patient able to self feed   Compensations: Chin tuck (with liquids)   Postural Changes: Seated upright at 90 degrees   Oral Care Recommendations: Oral care BID        Houston Siren 11/13/2021,2:28 PM

## 2021-11-19 ENCOUNTER — Encounter (HOSPITAL_COMMUNITY): Payer: Self-pay

## 2021-11-19 ENCOUNTER — Emergency Department (HOSPITAL_COMMUNITY): Payer: Managed Care, Other (non HMO)

## 2021-11-19 ENCOUNTER — Emergency Department (HOSPITAL_COMMUNITY)
Admission: EM | Admit: 2021-11-19 | Discharge: 2021-11-19 | Disposition: A | Discharge status: Home / Self Care | Payer: Managed Care, Other (non HMO) | Attending: Emergency Medicine | Admitting: Emergency Medicine

## 2021-11-19 ENCOUNTER — Other Ambulatory Visit: Payer: Self-pay

## 2021-11-19 DIAGNOSIS — N186 End stage renal disease: Secondary | ICD-10-CM | POA: Insufficient documentation

## 2021-11-19 DIAGNOSIS — R42 Dizziness and giddiness: Secondary | ICD-10-CM | POA: Insufficient documentation

## 2021-11-19 DIAGNOSIS — Z992 Dependence on renal dialysis: Secondary | ICD-10-CM | POA: Insufficient documentation

## 2021-11-19 DIAGNOSIS — D72829 Elevated white blood cell count, unspecified: Secondary | ICD-10-CM | POA: Insufficient documentation

## 2021-11-19 DIAGNOSIS — R310 Gross hematuria: Secondary | ICD-10-CM | POA: Insufficient documentation

## 2021-11-19 DIAGNOSIS — N3090 Cystitis, unspecified without hematuria: Secondary | ICD-10-CM

## 2021-11-19 DIAGNOSIS — E876 Hypokalemia: Secondary | ICD-10-CM | POA: Insufficient documentation

## 2021-11-19 DIAGNOSIS — Z7982 Long term (current) use of aspirin: Secondary | ICD-10-CM | POA: Diagnosis not present

## 2021-11-19 DIAGNOSIS — I69393 Ataxia following cerebral infarction: Secondary | ICD-10-CM | POA: Diagnosis not present

## 2021-11-19 DIAGNOSIS — I120 Hypertensive chronic kidney disease with stage 5 chronic kidney disease or end stage renal disease: Secondary | ICD-10-CM | POA: Insufficient documentation

## 2021-11-19 DIAGNOSIS — I2510 Atherosclerotic heart disease of native coronary artery without angina pectoris: Secondary | ICD-10-CM | POA: Insufficient documentation

## 2021-11-19 DIAGNOSIS — N3091 Cystitis, unspecified with hematuria: Secondary | ICD-10-CM | POA: Diagnosis not present

## 2021-11-19 DIAGNOSIS — R531 Weakness: Secondary | ICD-10-CM | POA: Diagnosis present

## 2021-11-19 LAB — URINALYSIS, ROUTINE W REFLEX MICROSCOPIC
Bilirubin Urine: NEGATIVE
Glucose, UA: 50 mg/dL — AB
Ketones, ur: NEGATIVE mg/dL
Nitrite: NEGATIVE
Protein, ur: 100 mg/dL — AB
RBC / HPF: >50 RBC/hpf — ABNORMAL HIGH (ref 0–5)
Renal Epithelial: 1
Specific Gravity, Urine: 1.028 (ref 1.005–1.030)
WBC, UA: >50 WBC/hpf — ABNORMAL HIGH (ref 0–5)
pH: 5 (ref 5.0–8.0)

## 2021-11-19 LAB — CBC WITH DIFFERENTIAL/PLATELET
Abs Immature Granulocytes: 0.11 10*3/uL — ABNORMAL HIGH (ref 0.00–0.07)
Basophils Absolute: 0.1 10*3/uL (ref 0.0–0.1)
Basophils Relative: 1 %
Eosinophils Absolute: 0.3 10*3/uL (ref 0.0–0.5)
Eosinophils Relative: 3 %
HCT: 34.1 % — ABNORMAL LOW (ref 39.0–52.0)
Hemoglobin: 11.7 g/dL — ABNORMAL LOW (ref 13.0–17.0)
Immature Granulocytes: 1 %
Lymphocytes Relative: 27 %
Lymphs Abs: 3 10*3/uL (ref 0.7–4.0)
MCH: 33.7 pg (ref 26.0–34.0)
MCHC: 34.3 g/dL (ref 30.0–36.0)
MCV: 98.3 fL (ref 80.0–100.0)
Monocytes Absolute: 0.9 10*3/uL (ref 0.1–1.0)
Monocytes Relative: 8 %
Neutro Abs: 6.7 10*3/uL (ref 1.7–7.7)
Neutrophils Relative %: 60 %
Platelets: 203 10*3/uL (ref 150–400)
RBC: 3.47 MIL/uL — ABNORMAL LOW (ref 4.22–5.81)
RDW: 14 % (ref 11.5–15.5)
WBC: 11.1 10*3/uL — ABNORMAL HIGH (ref 4.0–10.5)
nRBC: 0 % (ref 0.0–0.2)

## 2021-11-19 LAB — COMPREHENSIVE METABOLIC PANEL
ALT: 15 U/L (ref 0–44)
AST: 17 U/L (ref 15–41)
Albumin: 3.5 g/dL (ref 3.5–5.0)
Alkaline Phosphatase: 88 U/L (ref 38–126)
Anion gap: 13 (ref 5–15)
BUN: 52 mg/dL — ABNORMAL HIGH (ref 8–23)
CO2: 27 mmol/L (ref 22–32)
Calcium: 10.5 mg/dL — ABNORMAL HIGH (ref 8.9–10.3)
Chloride: 95 mmol/L — ABNORMAL LOW (ref 98–111)
Creatinine, Ser: 8.39 mg/dL — ABNORMAL HIGH (ref 0.61–1.24)
GFR, Estimated: 6 mL/min — ABNORMAL LOW (ref >60)
Glucose, Bld: 128 mg/dL — ABNORMAL HIGH (ref 70–99)
Potassium: 2.9 mmol/L — ABNORMAL LOW (ref 3.5–5.1)
Sodium: 135 mmol/L (ref 135–145)
Total Bilirubin: 0.3 mg/dL (ref 0.3–1.2)
Total Protein: 6.9 g/dL (ref 6.5–8.1)

## 2021-11-19 MED ORDER — CEPHALEXIN 500 MG PO CAPS: 500.0000 mg | ORAL_CAPSULE | Freq: Four times a day (QID) | ORAL | 0 refills | Status: DC

## 2021-11-19 MED ORDER — POTASSIUM CHLORIDE CRYS ER 20 MEQ PO TBCR
40.0000 meq | EXTENDED_RELEASE_TABLET | Freq: Once | ORAL | Status: AC
  Administered 2021-11-19: 40 meq via ORAL
  Filled 2021-11-19: qty 2

## 2021-11-19 MED ORDER — SODIUM CHLORIDE 0.9 % IV SOLN
1.0000 g | Freq: Once | INTRAVENOUS | Status: AC
  Administered 2021-11-19: 1 g via INTRAVENOUS
  Filled 2021-11-19: qty 10

## 2021-11-19 MED ORDER — POTASSIUM CHLORIDE CRYS ER 20 MEQ PO TBCR
40.0000 meq | EXTENDED_RELEASE_TABLET | Freq: Once | ORAL | Status: AC
Start: 2021-11-19 — End: 2021-11-19
  Administered 2021-11-19: 40 meq via ORAL
  Filled 2021-11-19: qty 2

## 2021-11-19 NOTE — ED Notes (Signed)
Dr at bedside.

## 2021-11-19 NOTE — ED Triage Notes (Signed)
Patient from home with complaints of generalized weakness, difficulty standing and blood in urine for the past three weeks.

## 2021-11-19 NOTE — ED Notes (Signed)
Pt's spouse at bedside brought in some info about the pt's peritoneal dialysis. The type of dialysis pt receives can alter blood sugar readings on some monitors. Laboratory values are best, there are only certain glucose meters that read correctly with the dialysis that the pt is receiving.  Also, states that she was told that if pt needs to be admitted, cannot be admitted to [facility] due to the inability to perform Peritoneal dialysis?

## 2021-11-19 NOTE — ED Provider Notes (Addendum)
[facility] EMERGENCY DEPARTMENT Provider Note   CSN: [id number] Arrival date & time: 11/19/21  1533     History  Chief Complaint  Patient presents with   Weakness    [name] is a 76 y.o. male.  Patient with several day to weeks of feeling off balance.  Followed by neurology.  They have ordered outpatient MRI but has not been done yet.  Patient also with a history of hematuria for approximately a week.  Patient is a peritoneal dialysis patient.  Patient also followed by cardiology.  With stents.  Some question of atrial flutter in the past.  Patient not considered to be a good candidate for anticoagulation.  Last heart catheterization was in 2013.  Followed by cardiology here.  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  Past medical history significant as mentioned for the coronary artery disease seizure disorder he is on Dilantin.  Chronic back pain hypertension deep vein thrombosis pulmonary embolism arthritis hyperlipidemia.  Patient never smoked.      Home Medications Prior to Admission medications   Medication Sig Start Date End Date Taking? Authorizing Provider  albuterol (PROVENTIL HFA;VENTOLIN HFA) 108 (90 BASE) MCG/ACT inhaler Inhale 2 puffs into the lungs every 6 (six) hours as needed for wheezing or shortness of breath.    Yes [provider]  aspirin EC 81 MG tablet Take 81 mg by mouth daily. Swallow whole.   Yes [provider]  AURYXIA 1 GM 210 MG(Fe) tablet Take 210 mg by mouth 3 (three) times daily. 10/23/20  Yes [provider]  B Complex-C-Folic Acid (RENO CAPS PO) Take 1 capsule by mouth daily.   Yes [provider]  calcium acetate, Phos Binder, (PHOSLYRA) 667 MG/5ML SOLN Take 1,334 mg by mouth 3 (three) times daily with meals.   Yes [provider]  collagenase (SANTYL) 250 UNIT/GM  ointment Apply 1 application. topically daily as needed (sore).   Yes [provider]  cyanocobalamin (,VITAMIN B-12,) 1000 MCG/ML injection cyanocobalamin (vit B-12) 1,000 mcg/mL injection solution   Yes [provider]  docusate sodium (COLACE) 100 MG capsule Take 200 mg by mouth 2 (two) times daily as needed for mild constipation.   Yes [provider]  finasteride (PROSCAR) 5 MG tablet Take 5 mg by mouth daily.   Yes [provider]  L-Lysine 1000 MG TABS Take 1 tablet by mouth daily.   Yes [provider]  levothyroxine (SYNTHROID) 175 MCG tablet Take 175 mcg by mouth daily before breakfast.  05/12/12  Yes [provider]  Multiple Vitamin (MULTIVITAMIN) tablet Take 1 tablet by mouth daily.   Yes [provider]  Omega-3 Fatty Acids (FISH OIL PO) Take 1 capsule by mouth 2 (two) times daily.   Yes [provider]  pantoprazole (PROTONIX) 40 MG tablet Take 40 mg by mouth daily.   Yes [provider]  polyethylene glycol (MIRALAX / GLYCOLAX) 17 g packet Take 17 g by mouth daily. 06/14/20  Yes Regalado, Belkys A, MD  sertraline (ZOLOFT) 50 MG tablet Take 50 mg by mouth daily.   Yes [provider]  spironolactone (ALDACTONE) 25 MG tablet Take 25 mg by mouth daily.   Yes [provider]  torsemide (DEMADEX) 100 MG tablet Take 1 tablet by mouth 2 (two) times daily. 03/26/21  Yes [provider]  TRINTELLIX 20 MG TABS tablet Take  20 mg by mouth daily. 03/19/21  Yes [provider]  fluticasone (FLONASE) 50 MCG/ACT nasal spray Place 1 spray into both nostrils daily as needed. Patient not taking: Reported on 10/31/2021    [provider]  simvastatin (ZOCOR) 40 MG tablet Take 1 tablet (40 mg total) by mouth every evening. Patient not taking: Reported on 11/19/2021 11/07/21   Erma Heritage, PA-C      Allergies    Patient has no known allergies.    Review of Systems   Review  of Systems  Constitutional:  Negative for chills and fever.  HENT:  Negative for ear pain and sore throat.   Eyes:  Negative for pain and visual disturbance.  Respiratory:  Negative for cough and shortness of breath.   Cardiovascular:  Negative for chest pain and palpitations.  Gastrointestinal:  Negative for abdominal pain and vomiting.  Genitourinary:  Positive for hematuria. Negative for dysuria.  Musculoskeletal:  Negative for arthralgias and back pain.  Skin:  Negative for color change and rash.  Neurological:  Positive for dizziness and weakness. Negative for seizures, syncope, facial asymmetry, speech difficulty, light-headedness, numbness and headaches.  All other systems reviewed and are negative.  Physical Exam Updated Vital Signs BP 130/81   Pulse 88   Temp 98.6 F (37 C)   Resp (!) 28   Ht 1.727 m ('5\' 8"'$ )   Wt 116.1 kg   SpO2 100%   BMI 38.92 kg/m  Physical Exam Vitals and nursing note reviewed.  Constitutional:      General: He is not in acute distress.    Appearance: Normal appearance. He is well-developed.  HENT:     Head: Normocephalic and atraumatic.  Eyes:     Conjunctiva/sclera: Conjunctivae normal.  Cardiovascular:     Rate and Rhythm: Normal rate and regular rhythm.     Heart sounds: No murmur heard. Pulmonary:     Effort: Pulmonary effort is normal. No respiratory distress.     Breath sounds: Normal breath sounds.  Abdominal:     General: There is no distension.     Palpations: Abdomen is soft.     Tenderness: There is no abdominal tenderness. There is no guarding.     Comments: Peritoneal dialysis right anterior abdomen  Musculoskeletal:        General: No swelling.     Cervical back: Normal range of motion and neck supple.     Right lower leg: No edema.     Left lower leg: No edema.  Skin:    General: Skin is warm and dry.     Capillary Refill: Capillary refill takes less than 2 seconds.  Neurological:     General: No focal deficit  present.     Mental Status: He is alert and oriented to person, place, and time.     Cranial Nerves: No cranial nerve deficit.     Sensory: No sensory deficit.     Motor: No weakness.     Comments: Good upper extremity lower extremity strength.  Psychiatric:        Mood and Affect: Mood normal.    ED Results / Procedures / Treatments   Labs (all labs ordered are listed, but only abnormal results are displayed) Labs Reviewed  COMPREHENSIVE METABOLIC PANEL - Abnormal; Notable for the following components:      Result Value   Potassium 2.9 (*)    Chloride 95 (*)    Glucose, Bld 128 (*)    BUN 52 (*)  Creatinine, Ser 8.39 (*)    Calcium 10.5 (*)    GFR, Estimated 6 (*)    All other components within normal limits  CBC WITH DIFFERENTIAL/PLATELET - Abnormal; Notable for the following components:   WBC 11.1 (*)    RBC 3.47 (*)    Hemoglobin 11.7 (*)    HCT 34.1 (*)    Abs Immature Granulocytes 0.11 (*)    All other components within normal limits  URINALYSIS, ROUTINE W REFLEX MICROSCOPIC - Abnormal; Notable for the following components:   Color, Urine AMBER (*)    APPearance CLOUDY (*)    Glucose, UA 50 (*)    Hgb urine dipstick LARGE (*)    Protein, ur 100 (*)    Leukocytes,Ua LARGE (*)    RBC / HPF >50 (*)    WBC, UA >50 (*)    Bacteria, UA FEW (*)    All other components within normal limits  URINE CULTURE    EKG EKG Interpretation  Date/Time:  Wednesday Nov 19 2021 15:41:55 EDT Ventricular Rate:  71 PR Interval:    QRS Duration: 114 QT Interval:  448 QTC Calculation: 487 R Axis:   54 Text Interpretation: Atrial flutter LVH with secondary repolarization abnormality Anterior Q waves, possibly due to LVH Confirmed by Fredia Sorrow [phone]) on 11/19/2021 5:17:33 PM  Radiology DG Chest 1 View  Result Date: 11/19/2021 CLINICAL DATA:  Weakness EXAM: CHEST  1 VIEW COMPARISON:  Chest x-ray dated July 30, 2021 FINDINGS: Unchanged cardiomegaly. Mild bilateral  perihilar opacities. No large pleural effusion. No evidence of pneumothorax. IMPRESSION: Mild bilateral perihilar opacities, possibly atelectasis or mild pulmonary edema. Electronically Signed   By: Yetta Glassman M.D.   On: 11/19/2021 18:00   MR ANGIO HEAD WO CONTRAST  Result Date: 11/19/2021 CLINICAL DATA:  Acute neuro deficit.  Abnormal balance. EXAM: MRI HEAD WITHOUT CONTRAST MRA HEAD WITHOUT CONTRAST TECHNIQUE: Multiplanar, multi-echo pulse sequences of the brain and surrounding structures were acquired without intravenous contrast. Angiographic images of the Circle of Willis were acquired using MRA technique without intravenous contrast. COMPARISON:  CT head 07/06/2017, MRI 09/20/2008 FINDINGS: MRI HEAD FINDINGS Brain: Scattered small areas of mildly increased signal on diffusion-weighted imaging without definite restricted diffusion on ADC map. Possible subacute or chronic infarct involving the right frontal parietal white matter and cerebellum bilaterally. Some of these areas show increased signal on FLAIR. Image quality degraded by motion. Patient not able to complete the study. Generalized atrophy without hydrocephalus. No mass or midline shift. Chronic microvascular ischemia is present. Vascular: Normal arterial flow voids at the skull base Skull and upper cervical spine: No focal skeletal abnormality. Sinuses/Orbits: Paranasal sinuses clear. Bilateral cataract extraction Other: None MRA HEAD FINDINGS Anterior circulation: Internal carotid artery widely patent with mild atherosclerotic irregularity. Anterior and middle cerebral arteries patent bilaterally. Atherosclerotic stenosis in MCA branches bilaterally. Moderate stenosis superior division right MCA. No large vessel occlusion. Negative for aneurysm Posterior circulation: Both vertebral arteries patent to the basilar. PICA not included on the study. Basilar widely patent. Posterior cerebral arteries patent bilaterally. Atherosclerotic  irregularity in the distal PCA bilaterally. Fetal origin right PCA. No aneurysm. Anatomic variants: None IMPRESSION: Multiple small areas of increased signal on diffusion imaging without definite restricted diffusion on ADC. These areas include the right frontal parietal white matter and cerebellum bilaterally. These may be due to subacute or chronic infarct. Intracranial atherosclerotic disease in the middle cerebral arteries and posterior cerebral arteries bilaterally. No large vessel occlusion. Electronically Signed  By: Franchot Gallo M.D.   On: 11/19/2021 18:26   MR Brain Wo Contrast (neuro protocol)  Result Date: 11/19/2021 CLINICAL DATA:  Acute neuro deficit.  Abnormal balance. EXAM: MRI HEAD WITHOUT CONTRAST MRA HEAD WITHOUT CONTRAST TECHNIQUE: Multiplanar, multi-echo pulse sequences of the brain and surrounding structures were acquired without intravenous contrast. Angiographic images of the Circle of Willis were acquired using MRA technique without intravenous contrast. COMPARISON:  CT head 07/06/2017, MRI 09/20/2008 FINDINGS: MRI HEAD FINDINGS Brain: Scattered small areas of mildly increased signal on diffusion-weighted imaging without definite restricted diffusion on ADC map. Possible subacute or chronic infarct involving the right frontal parietal white matter and cerebellum bilaterally. Some of these areas show increased signal on FLAIR. Image quality degraded by motion. Patient not able to complete the study. Generalized atrophy without hydrocephalus. No mass or midline shift. Chronic microvascular ischemia is present. Vascular: Normal arterial flow voids at the skull base Skull and upper cervical spine: No focal skeletal abnormality. Sinuses/Orbits: Paranasal sinuses clear. Bilateral cataract extraction Other: None MRA HEAD FINDINGS Anterior circulation: Internal carotid artery widely patent with mild atherosclerotic irregularity. Anterior and middle cerebral arteries patent bilaterally.  Atherosclerotic stenosis in MCA branches bilaterally. Moderate stenosis superior division right MCA. No large vessel occlusion. Negative for aneurysm Posterior circulation: Both vertebral arteries patent to the basilar. PICA not included on the study. Basilar widely patent. Posterior cerebral arteries patent bilaterally. Atherosclerotic irregularity in the distal PCA bilaterally. Fetal origin right PCA. No aneurysm. Anatomic variants: None IMPRESSION: Multiple small areas of increased signal on diffusion imaging without definite restricted diffusion on ADC. These areas include the right frontal parietal white matter and cerebellum bilaterally. These may be due to subacute or chronic infarct. Intracranial atherosclerotic disease in the middle cerebral arteries and posterior cerebral arteries bilaterally. No large vessel occlusion. Electronically Signed   By: Franchot Gallo M.D.   On: 11/19/2021 18:26   CT Renal Stone Study  Result Date: 11/19/2021 CLINICAL DATA:  Flank pain.  Rule out kidney stone EXAM: CT ABDOMEN AND PELVIS WITHOUT CONTRAST TECHNIQUE: Multidetector CT imaging of the abdomen and pelvis was performed following the standard protocol without IV contrast. RADIATION DOSE REDUCTION: This exam was performed according to the departmental dose-optimization program which includes automated exposure control, adjustment of the mA and/or kV according to patient size and/or use of iterative reconstruction technique. COMPARISON:  Abdominal sonogram 07/20/2017 FINDINGS: Lower chest: No acute abnormality. No focal liver abnormality identified. Gallbladder appears normal. No bile duct dilatation. Hepatobiliary: No focal liver abnormality is seen. No gallstones, gallbladder wall thickening, or biliary dilatation. Pancreas: Unremarkable. No pancreatic ductal dilatation or surrounding inflammatory changes. Spleen: Normal in size without focal abnormality. Adrenals/Urinary Tract: Normal adrenal glands. There are 2  small foci of gas identified within the upper pole collecting system of the left kidney, image 27/2 and image 29/2. No kidney stone or mass noted bilaterally. 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 8 mm calcified right renal artery aneurysm noted. No nephrolithiasis or hydronephrosis identified bilaterally. Mild bilateral renal cortical thinning. No hydroureter or ureteral lithiasis identified. There is diffuse bladder wall thickening with mild surrounding soft tissue haziness. Gas is noted within the wall of the urinary bladder. Stomach/Bowel: Small hiatal hernia. The appendix is visualized and appears normal. No bowel wall thickening, inflammation or distension. Large desiccated stool ball is identified within the rectum measuring 7.4 x 6.3 cm. Vascular/Lymphatic: Aortic atherosclerosis without aneurysm. No signs of abdominopelvic adenopathy. Reproductive: Prostate is unremarkable. Other: Peritoneal dialysis  catheter is identified with moderate volume of free fluid within the abdomen and pelvis. Musculoskeletal: No acute or significant osseous findings. IMPRESSION: 1. There is diffuse bladder wall thickening with mild surrounding soft tissue haziness. Gas is noted within the wall of the urinary bladder. Findings are concerning for cystitis. Emphysematous cystitis not excluded. Small foci of gas is also noted within the left renal collecting system. 2. No nephrolithiasis or hydronephrosis identified bilaterally. 3. Large desiccated stool ball identified within the rectum. Correlate for any clinical signs or symptoms of fecal impaction. 4. Bilateral calcified renal artery aneurysms. 5. Dialysis catheter in place with moderate free fluid in the abdomen pelvis likely reflecting dialysate. 6. Aortic Atherosclerosis (ICD10-I70.0). Critical Value/emergent results were called by telephone at the time of interpretation on 11/19/2021 at 6:01 pm to provider Fredia Sorrow , who verbally acknowledged  these results. Electronically Signed   By: Kerby Moors M.D.   On: 11/19/2021 18:02    Procedures Procedures    Medications Ordered in ED Medications  cefTRIAXone (ROCEPHIN) 1 g in sodium chloride 0.9 % 100 mL IVPB (has no administration in time range)  potassium chloride SA (KLOR-CON M) CR tablet 40 mEq (has no administration in time range)    ED Course/ Medical Decision Making/ A&P                           Medical Decision Making Amount and/or Complexity of Data Reviewed Labs: ordered. Radiology: ordered.  Risk Prescription drug management.   Patient here to try to get the MRI early.  And concerned about the hematuria has been going for a week.  Patient still makes urine although receiving peritoneal dialysis.  We will get urinalysis here send urine for culture.  We will check kidney function labs.  We will get MRI brain to evaluate the concern neurology had.  And will get CT renal as well as chest x-ray just to evaluate the hematuria and for the generalized weakness.  We will discuss with on-call nephrology about the hematuria urinalysis has greater than 50 reds greater than 50 whites.  Clearly will need treatment with antibiotics.  Question is whether he needs admission.  Electrolytes off a little bit potassium 2.9 BUN 52 creatinine 8.39.  GFR 6 but he is a peritoneal dialysis patient.  Mild leukocytosis of 11 and hemoglobin is 11.7.  MRI brain and MR angio without contrast multiple small areas of increased signal on dilute on diffusion imaging they feel that these are most likely due to subacute or chronic infarcts.'s areas include the right frontal parietal white matter and cerebellum bilaterally.  These locations certainly could explain some of his current symptoms.  He is followed by neurology at [facility].  They have spoken with them on the telephone as of May 15.  Needs close follow-up with them.  May require additional outpatient work-up.  Gust with Dr. Johnney Ou  nephrology recommending treating with Rocephin here and then Keflex at home.  Also recommended giving him a potassium orally here was okay with 40 mill equivalents now and then a repeat 40 mill equivalents.  Based on the potassium of 2.9.  Even though he is peritoneal dialysis patient.  And then they can have that followed up.     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

## 2021-11-19 NOTE — Discharge Instructions (Signed)
Follow back up with neurology at the [facility] clinic.  For the concerns for the subacute chronic strokes.  Also follow-up with nephrology or your primary care doctor to have potassium rechecked.  Was a little low here today and nephrology gave okay to give you some oral potassium here tonight.  Would expect improvement with the blood in the urine over the next few days.  If it does not completely clear up may need follow-up with urology.  But your doctors can schedule that.

## 2021-11-21 LAB — URINE CULTURE: Culture: >=100000 — AB

## 2021-11-22 ENCOUNTER — Telehealth (HOSPITAL_BASED_OUTPATIENT_CLINIC_OR_DEPARTMENT_OTHER): Payer: Self-pay | Admitting: *Deleted

## 2021-11-22 NOTE — Telephone Encounter (Signed)
Post ED Visit - Positive Culture Follow-up  Culture report reviewed by antimicrobial stewardship pharmacist: [place] Team '[]'$  Elenor Quinones, Pharm.D. '[]'$  Heide Guile, Pharm.D., BCPS AQ-ID '[]'$  Parks Neptune, Pharm.D., BCPS '[]'$  Alycia Rossetti, Pharm.D., BCPS '[]'$  [place], Pharm.D., BCPS, AAHIVP '[]'$  Legrand Como, Pharm.D., BCPS, AAHIVP '[]'$  Salome Arnt, PharmD, BCPS '[]'$  Johnnette Gourd, PharmD, BCPS '[]'$  Hughes Better, PharmD, BCPS '[]'$  Leeroy Cha, PharmD '[]'$  Laqueta Linden, PharmD, BCPS '[x]'$  Bertis Ruddy, PharmD  [place] Team '[]'$  Leodis Sias, PharmD '[]'$  Lindell Spar, PharmD '[]'$  Royetta Asal, PharmD '[]'$  Graylin Shiver, Rph '[]'$  Rema Fendt) Glennon Mac, PharmD '[]'$  Arlyn Dunning, PharmD '[]'$  Netta Cedars, PharmD '[]'$  Dia Sitter, PharmD '[]'$  Leone Haven, PharmD '[]'$  Gretta Arab, PharmD '[]'$  Theodis Shove, PharmD '[]'$  Peggyann Juba, PharmD '[]'$  Reuel Boom, PharmD   Positive urine culture Treated with Cephalexin, organism sensitive to the same and no further patient follow-up is required at this time.  Rosie Fate 11/22/2021, 11:36 AM

## 2021-12-02 ENCOUNTER — Encounter: Payer: Self-pay | Admitting: Gastroenterology

## 2021-12-02 ENCOUNTER — Ambulatory Visit (INDEPENDENT_AMBULATORY_CARE_PROVIDER_SITE_OTHER): Payer: Managed Care, Other (non HMO) | Admitting: Gastroenterology

## 2021-12-02 VITALS — BP 92/60 | HR 76 | Ht 68.0 in | Wt 225.0 lb

## 2021-12-02 DIAGNOSIS — I25119 Atherosclerotic heart disease of native coronary artery with unspecified angina pectoris: Secondary | ICD-10-CM

## 2021-12-02 DIAGNOSIS — R1314 Dysphagia, pharyngoesophageal phase: Secondary | ICD-10-CM

## 2021-12-02 DIAGNOSIS — K5909 Other constipation: Secondary | ICD-10-CM | POA: Diagnosis not present

## 2021-12-02 NOTE — Patient Instructions (Signed)
If you are age 77 or older, your body mass index should be between 23-30. Your Body mass index is 34.21 kg/m. If this is out of the aforementioned range listed, please consider follow up with your Primary Care Provider.  If you are age 51 or younger, your body mass index should be between 19-25. Your Body mass index is 34.21 kg/m. If this is out of the aformentioned range listed, please consider follow up with your Primary Care Provider.   ________________________________________________________  The [place] [facility] providers would like to encourage you to use [facility] to communicate with providers for non-urgent requests or questions.  Due to long hold times on the telephone, sending your provider a message by [facility] may be a faster and more efficient way to get a response.  Please allow 48 business hours for a response.  Please remember that this is for non-urgent requests.  _______________________________________________________  It was a pleasure to see you today!  Thank you for trusting me with your gastrointestinal care!

## 2021-12-02 NOTE — Progress Notes (Signed)
[facility] Progress Note  Chief Complaint: Chronic constipation, oropharyngeal dysphagia with cough  Subjective  History: Mary follows up today after his office visit a month ago.  Clinical details in that comprehensive note.  He has severe constipation that he believes is due to dialysis. Incidental colon polyps discovered on colonoscopy for GI bleeding January 2022, not removed because he was on anticoagulation.  After his recent office visit, my impression was that the risk of a repeat colonoscopy with polypectomy outweigh the expected benefits with the small benign-appearing polyps seen at that time.  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  Coronary disease, seizure disorder, DVT/PE He was recently in the ED for loss of equilibrium and hematuria.  MRI showed chronic white matter changes felt likely due to subacute or chronic infarcts in the right frontoparietal white matter and bilateral cerebellum. Regarding his chronic dysphagia, modified barium study ordered after his last office visit with [place], with results/report as below.  Fenton was seen with his daughter today.  She has been giving him MiraLAX about once a week and was uncertain the optimal frequency with which she should take it.  She also just picked up some glycerin suppositories and was planning to start using them this week but also uncertain how often she use one. He has had some intermittent "dry heaves" and these often seem to occur with changes in position. (Of note, Hillman arrived 3 hours early for his appointment today but was worked into the morning clinic session) ROS: Cardiovascular:  no chest pain Respiratory: no dyspnea  The patient's Past Medical, Family and Social History were reviewed and are on file in the EMR.  Objective:  Med list reviewed  Current Outpatient Medications:    albuterol (PROVENTIL  HFA;VENTOLIN HFA) 108 (90 BASE) MCG/ACT inhaler, Inhale 2 puffs into the lungs every 6 (six) hours as needed for wheezing or shortness of breath. , Disp: , Rfl:    aspirin EC 81 MG tablet, Take 81 mg by mouth daily. Swallow whole., Disp: , Rfl:    AURYXIA 1 GM 210 MG(Fe) tablet, Take 210 mg by mouth 3 (three) times daily., Disp: , Rfl:    calcium acetate, Phos Binder, (PHOSLYRA) 667 MG/5ML SOLN, Take 1,334 mg by mouth 3 (three) times daily with meals., Disp: , Rfl:    collagenase (SANTYL) 250 UNIT/GM ointment, Apply 1 application. topically daily as needed (sore)., Disp: , Rfl:    cyanocobalamin (,VITAMIN B-12,) 1000 MCG/ML injection, cyanocobalamin (vit B-12) 1,000 mcg/mL injection solution, Disp: , Rfl:    docusate sodium (COLACE) 100 MG capsule, Take 200 mg by mouth 2 (two) times daily as needed for mild constipation., Disp: , Rfl:    finasteride (PROSCAR) 5 MG tablet, Take 5 mg by mouth daily., Disp: , Rfl:    fluticasone (FLONASE) 50 MCG/ACT nasal spray, Place 1 spray into both nostrils daily as needed., Disp: , Rfl:    L-Lysine 1000 MG TABS, Take 1 tablet by mouth daily., Disp: , Rfl:    levothyroxine (SYNTHROID) 175 MCG tablet, Take 175 mcg by mouth daily before breakfast. , Disp: , Rfl:    Multiple Vitamin (MULTIVITAMIN) tablet, Take 1 tablet by mouth daily., Disp: , Rfl:    Omega-3 Fatty Acids (FISH OIL PO), Take 1 capsule by mouth 2 (two) times daily., Disp: , Rfl:    pantoprazole (PROTONIX) 40 MG tablet, Take 40 mg by mouth daily., Disp: ,  Rfl:    polyethylene glycol (MIRALAX / GLYCOLAX) 17 g packet, Take 17 g by mouth daily., Disp: 14 each, Rfl: 0   sertraline (ZOLOFT) 50 MG tablet, Take 50 mg by mouth daily., Disp: , Rfl:    simvastatin (ZOCOR) 40 MG tablet, Take 1 tablet (40 mg total) by mouth every evening., Disp: 30 tablet, Rfl: 6   spironolactone (ALDACTONE) 25 MG tablet, Take 25 mg by mouth daily., Disp: , Rfl:    torsemide (DEMADEX) 100 MG tablet, Take 1 tablet by mouth 2 (two)  times daily., Disp: , Rfl:    TRINTELLIX 20 MG TABS tablet, Take 20 mg by mouth daily., Disp: , Rfl:    Vital signs in last 24 hrs: Vitals:   12/02/21 1113  BP: 92/60  Pulse: 76   Wt Readings from Last 3 Encounters:  12/02/21 225 lb (102.1 kg)  11/19/21 256 lb (116.1 kg)  11/07/21 259 lb (117.5 kg)    Physical Exam  Wheelchair-bound, limits exam. No oropharyngeal lesions or neck tenderness or abnormality Cardiac rhythm is regular Lungs clear bilaterally, fair inspiratory effort Abdomen obese, soft nontender, right-sided PD catheter  Labs:   ___________________________________________ Radiologic studies:  Pt demonstrated min-mild oropharyngeal dysphagia with laryngeal penetration. Reduced oral cohesion led to anterior sulcus residue, minimal lingual residue spilled into vallecuale. Laryngeal closure was reached but late with liquid reaching the pyriform sinuses and penetrated into laryngeal vestibule (PAS 3) on anterior vestibule wall. Pt was instructed on chin tuck technique that consistently prevented material from entering airway during study.Minimal vallecular residue observed. Esophageal sweep was unremarkable although not diagnosed with MBS. Pt was instructed to continue with chin tuck strategy with all liquids and provided written information. Recommend continue regular texture as pt able to masticate, thin liquids, pills whole in puree. SLP Visit Diagnosis Dysphagia, oropharyngeal phase (R13.12) Impact on safety and function Mild aspiration risk  ____________________________________________ Other:   _____________________________________________ Assessment & Plan  Assessment: Encounter Diagnoses  Name Primary?   Chronic constipation Yes   Pharyngoesophageal dysphagia    Multifactorial constipation.  Caution warranted regarding prescription medicines given his age medical conditions and polypharmacy. Safest thing is MiraLAX, which should be used at least every  other day.  I also agree with the glycerin suppository, I would try to do so nightly for the first week, and then 2-3 times a week thereafter if feasible. Frequency of both MiraLAX and glycerin can be changed depending upon their affect. His daughter will contact us with further advice, and I will try to manage as much as possible over the phone or computer given her father's limited mobility and difficulty getting to appointments.  No current plans for endoscopic testing   24 minutes were spent on this encounter (including chart review, history/exam, counseling/coordination of care, and documentation) > 50% of that time was spent on counseling and coordination of care.   Nelida Meuse III

## 2021-12-13 ENCOUNTER — Emergency Department (HOSPITAL_COMMUNITY): Payer: Managed Care, Other (non HMO)

## 2021-12-13 ENCOUNTER — Inpatient Hospital Stay (HOSPITAL_COMMUNITY)
Admission: EM | Admit: 2021-12-13 | Discharge: 2021-12-17 | DRG: 867 | Disposition: A | Discharge status: Home Health | Payer: Managed Care, Other (non HMO) | Attending: Family Medicine | Admitting: Family Medicine

## 2021-12-13 DIAGNOSIS — D472 Monoclonal gammopathy: Secondary | ICD-10-CM | POA: Diagnosis present

## 2021-12-13 DIAGNOSIS — J449 Chronic obstructive pulmonary disease, unspecified: Secondary | ICD-10-CM | POA: Diagnosis present

## 2021-12-13 DIAGNOSIS — M4306 Spondylolysis, lumbar region: Secondary | ICD-10-CM | POA: Diagnosis present

## 2021-12-13 DIAGNOSIS — Z87891 Personal history of nicotine dependence: Secondary | ICD-10-CM

## 2021-12-13 DIAGNOSIS — N390 Urinary tract infection, site not specified: Secondary | ICD-10-CM | POA: Diagnosis present

## 2021-12-13 DIAGNOSIS — M109 Gout, unspecified: Secondary | ICD-10-CM | POA: Diagnosis present

## 2021-12-13 DIAGNOSIS — T8029XA Infection following other infusion, transfusion and therapeutic injection, initial encounter: Principal | ICD-10-CM | POA: Diagnosis present

## 2021-12-13 DIAGNOSIS — E079 Disorder of thyroid, unspecified: Secondary | ICD-10-CM | POA: Diagnosis present

## 2021-12-13 DIAGNOSIS — R319 Hematuria, unspecified: Secondary | ICD-10-CM | POA: Diagnosis present

## 2021-12-13 DIAGNOSIS — R4781 Slurred speech: Secondary | ICD-10-CM | POA: Diagnosis present

## 2021-12-13 DIAGNOSIS — E669 Obesity, unspecified: Secondary | ICD-10-CM | POA: Diagnosis present

## 2021-12-13 DIAGNOSIS — I120 Hypertensive chronic kidney disease with stage 5 chronic kidney disease or end stage renal disease: Secondary | ICD-10-CM | POA: Diagnosis present

## 2021-12-13 DIAGNOSIS — Z961 Presence of intraocular lens: Secondary | ICD-10-CM | POA: Diagnosis present

## 2021-12-13 DIAGNOSIS — B9620 Unspecified Escherichia coli [E. coli] as the cause of diseases classified elsewhere: Secondary | ICD-10-CM | POA: Diagnosis present

## 2021-12-13 DIAGNOSIS — E871 Hypo-osmolality and hyponatremia: Secondary | ICD-10-CM | POA: Diagnosis present

## 2021-12-13 DIAGNOSIS — T8571XA Infection and inflammatory reaction due to peritoneal dialysis catheter, initial encounter: Principal | ICD-10-CM

## 2021-12-13 DIAGNOSIS — Z951 Presence of aortocoronary bypass graft: Secondary | ICD-10-CM

## 2021-12-13 DIAGNOSIS — M898X9 Other specified disorders of bone, unspecified site: Secondary | ICD-10-CM | POA: Diagnosis present

## 2021-12-13 DIAGNOSIS — E782 Mixed hyperlipidemia: Secondary | ICD-10-CM | POA: Diagnosis present

## 2021-12-13 DIAGNOSIS — Z87442 Personal history of urinary calculi: Secondary | ICD-10-CM

## 2021-12-13 DIAGNOSIS — K659 Peritonitis, unspecified: Secondary | ICD-10-CM | POA: Diagnosis not present

## 2021-12-13 DIAGNOSIS — E039 Hypothyroidism, unspecified: Secondary | ICD-10-CM | POA: Diagnosis present

## 2021-12-13 DIAGNOSIS — Z9842 Cataract extraction status, left eye: Secondary | ICD-10-CM

## 2021-12-13 DIAGNOSIS — R1310 Dysphagia, unspecified: Secondary | ICD-10-CM | POA: Diagnosis present

## 2021-12-13 DIAGNOSIS — Z8249 Family history of ischemic heart disease and other diseases of the circulatory system: Secondary | ICD-10-CM

## 2021-12-13 DIAGNOSIS — K650 Generalized (acute) peritonitis: Secondary | ICD-10-CM | POA: Diagnosis present

## 2021-12-13 DIAGNOSIS — Z9841 Cataract extraction status, right eye: Secondary | ICD-10-CM

## 2021-12-13 DIAGNOSIS — E876 Hypokalemia: Secondary | ICD-10-CM | POA: Diagnosis present

## 2021-12-13 DIAGNOSIS — Z66 Do not resuscitate: Secondary | ICD-10-CM | POA: Diagnosis present

## 2021-12-13 DIAGNOSIS — N186 End stage renal disease: Secondary | ICD-10-CM | POA: Diagnosis present

## 2021-12-13 DIAGNOSIS — Z7982 Long term (current) use of aspirin: Secondary | ICD-10-CM

## 2021-12-13 DIAGNOSIS — Z8673 Personal history of transient ischemic attack (TIA), and cerebral infarction without residual deficits: Secondary | ICD-10-CM

## 2021-12-13 DIAGNOSIS — Z6834 Body mass index (BMI) 34.0-34.9, adult: Secondary | ICD-10-CM

## 2021-12-13 DIAGNOSIS — Y841 Kidney dialysis as the cause of abnormal reaction of the patient, or of later complication, without mention of misadventure at the time of the procedure: Secondary | ICD-10-CM | POA: Diagnosis present

## 2021-12-13 DIAGNOSIS — I10 Essential (primary) hypertension: Secondary | ICD-10-CM | POA: Diagnosis present

## 2021-12-13 DIAGNOSIS — R739 Hyperglycemia, unspecified: Secondary | ICD-10-CM | POA: Diagnosis present

## 2021-12-13 DIAGNOSIS — Z953 Presence of xenogenic heart valve: Secondary | ICD-10-CM

## 2021-12-13 DIAGNOSIS — I4892 Unspecified atrial flutter: Secondary | ICD-10-CM | POA: Diagnosis present

## 2021-12-13 DIAGNOSIS — F32A Depression, unspecified: Secondary | ICD-10-CM | POA: Diagnosis present

## 2021-12-13 DIAGNOSIS — Z79899 Other long term (current) drug therapy: Secondary | ICD-10-CM

## 2021-12-13 DIAGNOSIS — Z86711 Personal history of pulmonary embolism: Secondary | ICD-10-CM

## 2021-12-13 DIAGNOSIS — E8770 Fluid overload, unspecified: Secondary | ICD-10-CM | POA: Diagnosis present

## 2021-12-13 DIAGNOSIS — Z992 Dependence on renal dialysis: Secondary | ICD-10-CM

## 2021-12-13 DIAGNOSIS — L8990 Pressure ulcer of unspecified site, unspecified stage: Secondary | ICD-10-CM | POA: Insufficient documentation

## 2021-12-13 DIAGNOSIS — L89312 Pressure ulcer of right buttock, stage 2: Secondary | ICD-10-CM | POA: Diagnosis present

## 2021-12-13 DIAGNOSIS — Z79890 Hormone replacement therapy (postmenopausal): Secondary | ICD-10-CM

## 2021-12-13 DIAGNOSIS — Z96653 Presence of artificial knee joint, bilateral: Secondary | ICD-10-CM | POA: Diagnosis present

## 2021-12-13 DIAGNOSIS — I2510 Atherosclerotic heart disease of native coronary artery without angina pectoris: Secondary | ICD-10-CM | POA: Diagnosis present

## 2021-12-13 DIAGNOSIS — F419 Anxiety disorder, unspecified: Secondary | ICD-10-CM | POA: Diagnosis present

## 2021-12-13 DIAGNOSIS — D631 Anemia in chronic kidney disease: Secondary | ICD-10-CM | POA: Diagnosis present

## 2021-12-13 NOTE — ED Triage Notes (Signed)
BIB [facility] from home. 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 Family reports mentation is at baseline. Family called EMS because they noticed blood in the patient's urine and peritoneal dialysis fluid was cloudy. Afebrile.

## 2021-12-14 ENCOUNTER — Encounter (HOSPITAL_COMMUNITY): Payer: Self-pay | Admitting: Internal Medicine

## 2021-12-14 DIAGNOSIS — E669 Obesity, unspecified: Secondary | ICD-10-CM | POA: Diagnosis present

## 2021-12-14 DIAGNOSIS — Y841 Kidney dialysis as the cause of abnormal reaction of the patient, or of later complication, without mention of misadventure at the time of the procedure: Secondary | ICD-10-CM | POA: Diagnosis present

## 2021-12-14 DIAGNOSIS — Z961 Presence of intraocular lens: Secondary | ICD-10-CM | POA: Diagnosis present

## 2021-12-14 DIAGNOSIS — L89312 Pressure ulcer of right buttock, stage 2: Secondary | ICD-10-CM | POA: Diagnosis present

## 2021-12-14 DIAGNOSIS — J449 Chronic obstructive pulmonary disease, unspecified: Secondary | ICD-10-CM | POA: Diagnosis present

## 2021-12-14 DIAGNOSIS — L8990 Pressure ulcer of unspecified site, unspecified stage: Secondary | ICD-10-CM | POA: Diagnosis not present

## 2021-12-14 DIAGNOSIS — F419 Anxiety disorder, unspecified: Secondary | ICD-10-CM | POA: Diagnosis present

## 2021-12-14 DIAGNOSIS — F32A Depression, unspecified: Secondary | ICD-10-CM | POA: Diagnosis present

## 2021-12-14 DIAGNOSIS — K659 Peritonitis, unspecified: Secondary | ICD-10-CM

## 2021-12-14 DIAGNOSIS — N390 Urinary tract infection, site not specified: Secondary | ICD-10-CM | POA: Diagnosis present

## 2021-12-14 DIAGNOSIS — I4892 Unspecified atrial flutter: Secondary | ICD-10-CM | POA: Diagnosis present

## 2021-12-14 DIAGNOSIS — I120 Hypertensive chronic kidney disease with stage 5 chronic kidney disease or end stage renal disease: Secondary | ICD-10-CM | POA: Diagnosis present

## 2021-12-14 DIAGNOSIS — K650 Generalized (acute) peritonitis: Secondary | ICD-10-CM | POA: Diagnosis present

## 2021-12-14 DIAGNOSIS — Z992 Dependence on renal dialysis: Secondary | ICD-10-CM | POA: Diagnosis not present

## 2021-12-14 DIAGNOSIS — E876 Hypokalemia: Secondary | ICD-10-CM | POA: Diagnosis present

## 2021-12-14 DIAGNOSIS — D472 Monoclonal gammopathy: Secondary | ICD-10-CM | POA: Diagnosis present

## 2021-12-14 DIAGNOSIS — I2510 Atherosclerotic heart disease of native coronary artery without angina pectoris: Secondary | ICD-10-CM | POA: Diagnosis present

## 2021-12-14 DIAGNOSIS — Z953 Presence of xenogenic heart valve: Secondary | ICD-10-CM | POA: Diagnosis not present

## 2021-12-14 DIAGNOSIS — A419 Sepsis, unspecified organism: Secondary | ICD-10-CM | POA: Diagnosis not present

## 2021-12-14 DIAGNOSIS — R319 Hematuria, unspecified: Secondary | ICD-10-CM | POA: Diagnosis present

## 2021-12-14 DIAGNOSIS — N186 End stage renal disease: Secondary | ICD-10-CM | POA: Diagnosis present

## 2021-12-14 DIAGNOSIS — T8029XA Infection following other infusion, transfusion and therapeutic injection, initial encounter: Secondary | ICD-10-CM | POA: Diagnosis present

## 2021-12-14 DIAGNOSIS — E039 Hypothyroidism, unspecified: Secondary | ICD-10-CM | POA: Diagnosis present

## 2021-12-14 DIAGNOSIS — I639 Cerebral infarction, unspecified: Secondary | ICD-10-CM | POA: Diagnosis not present

## 2021-12-14 DIAGNOSIS — D631 Anemia in chronic kidney disease: Secondary | ICD-10-CM | POA: Diagnosis present

## 2021-12-14 DIAGNOSIS — E782 Mixed hyperlipidemia: Secondary | ICD-10-CM | POA: Diagnosis present

## 2021-12-14 DIAGNOSIS — Z66 Do not resuscitate: Secondary | ICD-10-CM | POA: Diagnosis present

## 2021-12-14 DIAGNOSIS — M4306 Spondylolysis, lumbar region: Secondary | ICD-10-CM | POA: Diagnosis present

## 2021-12-14 DIAGNOSIS — E871 Hypo-osmolality and hyponatremia: Secondary | ICD-10-CM | POA: Diagnosis present

## 2021-12-14 LAB — COMPREHENSIVE METABOLIC PANEL
ALT: 18 U/L (ref 0–44)
ALT: 21 U/L (ref 0–44)
AST: 54 U/L — ABNORMAL HIGH (ref 15–41)
AST: 56 U/L — ABNORMAL HIGH (ref 15–41)
Albumin: 2.7 g/dL — ABNORMAL LOW (ref 3.5–5.0)
Albumin: 3.3 g/dL — ABNORMAL LOW (ref 3.5–5.0)
Alkaline Phosphatase: 69 U/L (ref 38–126)
Alkaline Phosphatase: 85 U/L (ref 38–126)
Anion gap: 14 (ref 5–15)
Anion gap: 16 — ABNORMAL HIGH (ref 5–15)
BUN: 40 mg/dL — ABNORMAL HIGH (ref 8–23)
BUN: 42 mg/dL — ABNORMAL HIGH (ref 8–23)
CO2: 25 mmol/L (ref 22–32)
CO2: 26 mmol/L (ref 22–32)
Calcium: 8.9 mg/dL (ref 8.9–10.3)
Calcium: 9.8 mg/dL (ref 8.9–10.3)
Chloride: 92 mmol/L — ABNORMAL LOW (ref 98–111)
Chloride: 96 mmol/L — ABNORMAL LOW (ref 98–111)
Creatinine, Ser: 6.53 mg/dL — ABNORMAL HIGH (ref 0.61–1.24)
Creatinine, Ser: 6.74 mg/dL — ABNORMAL HIGH (ref 0.61–1.24)
GFR, Estimated: 8 mL/min — ABNORMAL LOW (ref >60)
GFR, Estimated: 8 mL/min — ABNORMAL LOW (ref >60)
Glucose, Bld: 125 mg/dL — ABNORMAL HIGH (ref 70–99)
Glucose, Bld: 187 mg/dL — ABNORMAL HIGH (ref 70–99)
Potassium: 2.7 mmol/L — CL (ref 3.5–5.1)
Potassium: 3.1 mmol/L — ABNORMAL LOW (ref 3.5–5.1)
Sodium: 134 mmol/L — ABNORMAL LOW (ref 135–145)
Sodium: 135 mmol/L (ref 135–145)
Total Bilirubin: 0.6 mg/dL (ref 0.3–1.2)
Total Bilirubin: 0.8 mg/dL (ref 0.3–1.2)
Total Protein: 6.4 g/dL — ABNORMAL LOW (ref 6.5–8.1)
Total Protein: 7.7 g/dL (ref 6.5–8.1)

## 2021-12-14 LAB — BODY FLUID CELL COUNT WITH DIFFERENTIAL
Eos, Fluid: 0 %
Lymphs, Fluid: 0 %
Monocyte-Macrophage-Serous Fluid: 3 % — ABNORMAL LOW (ref 50–90)
Neutrophil Count, Fluid: 97 % — ABNORMAL HIGH (ref 0–25)
Total Nucleated Cell Count, Fluid: 8306 cu mm — ABNORMAL HIGH (ref 0–1000)

## 2021-12-14 LAB — CBC WITH DIFFERENTIAL/PLATELET
Abs Immature Granulocytes: 0.1 10*3/uL — ABNORMAL HIGH (ref 0.00–0.07)
Abs Immature Granulocytes: 0.13 10*3/uL — ABNORMAL HIGH (ref 0.00–0.07)
Basophils Absolute: 0 10*3/uL (ref 0.0–0.1)
Basophils Absolute: 0 10*3/uL (ref 0.0–0.1)
Basophils Relative: 0 %
Basophils Relative: 0 %
Eosinophils Absolute: 0 10*3/uL (ref 0.0–0.5)
Eosinophils Absolute: 0 10*3/uL (ref 0.0–0.5)
Eosinophils Relative: 0 %
Eosinophils Relative: 0 %
HCT: 37.1 % — ABNORMAL LOW (ref 39.0–52.0)
HCT: 41.1 % (ref 39.0–52.0)
Hemoglobin: 13.4 g/dL (ref 13.0–17.0)
Hemoglobin: 15 g/dL (ref 13.0–17.0)
Immature Granulocytes: 1 %
Immature Granulocytes: 1 %
Lymphocytes Relative: 19 %
Lymphocytes Relative: 8 %
Lymphs Abs: 1.1 10*3/uL (ref 0.7–4.0)
Lymphs Abs: 1.9 10*3/uL (ref 0.7–4.0)
MCH: 34.6 pg — ABNORMAL HIGH (ref 26.0–34.0)
MCH: 34.8 pg — ABNORMAL HIGH (ref 26.0–34.0)
MCHC: 36.1 g/dL — ABNORMAL HIGH (ref 30.0–36.0)
MCHC: 36.5 g/dL — ABNORMAL HIGH (ref 30.0–36.0)
MCV: 95.4 fL (ref 80.0–100.0)
MCV: 95.9 fL (ref 80.0–100.0)
Monocytes Absolute: 0.4 10*3/uL (ref 0.1–1.0)
Monocytes Absolute: 0.4 10*3/uL (ref 0.1–1.0)
Monocytes Relative: 3 %
Monocytes Relative: 4 %
Neutro Abs: 12.9 10*3/uL — ABNORMAL HIGH (ref 1.7–7.7)
Neutro Abs: 7.6 10*3/uL (ref 1.7–7.7)
Neutrophils Relative %: 76 %
Neutrophils Relative %: 88 %
Platelets: 173 10*3/uL (ref 150–400)
Platelets: 198 10*3/uL (ref 150–400)
RBC: 3.87 MIL/uL — ABNORMAL LOW (ref 4.22–5.81)
RBC: 4.31 MIL/uL (ref 4.22–5.81)
RDW: 13.2 % (ref 11.5–15.5)
RDW: 13.5 % (ref 11.5–15.5)
WBC: 14.6 10*3/uL — ABNORMAL HIGH (ref 4.0–10.5)
WBC: 9.9 10*3/uL (ref 4.0–10.5)
nRBC: 0 % (ref 0.0–0.2)
nRBC: 0 % (ref 0.0–0.2)

## 2021-12-14 LAB — URINALYSIS, ROUTINE W REFLEX MICROSCOPIC
Bilirubin Urine: NEGATIVE
Glucose, UA: 50 mg/dL — AB
Ketones, ur: NEGATIVE mg/dL
Nitrite: NEGATIVE
Protein, ur: 100 mg/dL — AB
RBC / HPF: >50 RBC/hpf — ABNORMAL HIGH (ref 0–5)
Specific Gravity, Urine: 1.03 (ref 1.005–1.030)
WBC, UA: >50 WBC/hpf — ABNORMAL HIGH (ref 0–5)
pH: 5 (ref 5.0–8.0)

## 2021-12-14 LAB — PROTEIN, PLEURAL OR PERITONEAL FLUID: Total protein, fluid: <3 g/dL

## 2021-12-14 LAB — LACTIC ACID, PLASMA
Lactic Acid, Venous: 1.6 mmol/L (ref 0.5–1.9)
Lactic Acid, Venous: 1.9 mmol/L (ref 0.5–1.9)
Lactic Acid, Venous: 2.8 mmol/L (ref 0.5–1.9)
Lactic Acid, Venous: 3.1 mmol/L (ref 0.5–1.9)

## 2021-12-14 LAB — MAGNESIUM: Magnesium: 1.6 mg/dL — ABNORMAL LOW (ref 1.7–2.4)

## 2021-12-14 LAB — GLUCOSE, PLEURAL OR PERITONEAL FLUID: Glucose, Fluid: 447 mg/dL

## 2021-12-14 LAB — ALBUMIN, PLEURAL OR PERITONEAL FLUID: Albumin, Fluid: <1.5 g/dL

## 2021-12-14 LAB — LIPASE, BLOOD: Lipase: 38 U/L (ref 11–51)

## 2021-12-14 LAB — PHOSPHORUS: Phosphorus: 3.1 mg/dL (ref 2.5–4.6)

## 2021-12-14 LAB — LACTATE DEHYDROGENASE, PLEURAL OR PERITONEAL FLUID: LD, Fluid: 27 U/L — ABNORMAL HIGH (ref 3–23)

## 2021-12-14 LAB — PROTIME-INR
INR: 1.1 (ref 0.8–1.2)
Prothrombin Time: 13.7 seconds (ref 11.4–15.2)

## 2021-12-14 MED ORDER — CALCIUM ACETATE (PHOS BINDER) 667 MG/5ML PO SOLN
1334.0000 mg | Freq: Three times a day (TID) | ORAL | Status: DC
  Filled 2021-12-14 (×5): qty 10

## 2021-12-14 MED ORDER — SODIUM CHLORIDE 0.9 % IV SOLN: 2.0000 g | INTRAVENOUS | Status: DC

## 2021-12-14 MED ORDER — LEVOTHYROXINE SODIUM 75 MCG PO TABS
175.0000 ug | ORAL_TABLET | Freq: Every day | ORAL | Status: DC
  Administered 2021-12-14 – 2021-12-17 (×4): 175 ug via ORAL
  Filled 2021-12-14 (×4): qty 1

## 2021-12-14 MED ORDER — SODIUM CHLORIDE 0.9 % IV SOLN
2.0000 g | Freq: Once | INTRAVENOUS | Status: AC
  Administered 2021-12-14: 2 g via INTRAVENOUS
  Filled 2021-12-14: qty 12.5

## 2021-12-14 MED ORDER — POLYETHYLENE GLYCOL 3350 17 G PO PACK: 17.0000 g | PACK | Freq: Every day | ORAL | Status: DC | PRN

## 2021-12-14 MED ORDER — GENTAMICIN SULFATE 0.1 % EX CREA
1.0000 | TOPICAL_CREAM | Freq: Every day | CUTANEOUS | Status: DC
  Administered 2021-12-15 – 2021-12-17 (×4): 1 via TOPICAL
  Filled 2021-12-14: qty 15

## 2021-12-14 MED ORDER — ACETAMINOPHEN 500 MG PO TABS: 500.0000 mg | ORAL_TABLET | Freq: Four times a day (QID) | ORAL | Status: DC | PRN

## 2021-12-14 MED ORDER — SIMVASTATIN 20 MG PO TABS
40.0000 mg | ORAL_TABLET | Freq: Every evening | ORAL | Status: DC
  Administered 2021-12-14 – 2021-12-16 (×3): 40 mg via ORAL
  Filled 2021-12-14 (×3): qty 2

## 2021-12-14 MED ORDER — HEPARIN SODIUM (PORCINE) 5000 UNIT/ML IJ SOLN
5000.0000 [IU] | Freq: Three times a day (TID) | INTRAMUSCULAR | Status: DC
  Administered 2021-12-14 – 2021-12-17 (×9): 5000 [IU] via SUBCUTANEOUS
  Filled 2021-12-14 (×10): qty 1

## 2021-12-14 MED ORDER — PANTOPRAZOLE SODIUM 40 MG PO TBEC
40.0000 mg | DELAYED_RELEASE_TABLET | Freq: Every day | ORAL | Status: DC
  Administered 2021-12-14 – 2021-12-17 (×4): 40 mg via ORAL
  Filled 2021-12-14 (×4): qty 1

## 2021-12-14 MED ORDER — ALBUTEROL SULFATE (2.5 MG/3ML) 0.083% IN NEBU
2.5000 mg | INHALATION_SOLUTION | Freq: Four times a day (QID) | RESPIRATORY_TRACT | Status: DC | PRN
Start: 2021-12-14 — End: 2021-12-18

## 2021-12-14 MED ORDER — SERTRALINE HCL 50 MG PO TABS
50.0000 mg | ORAL_TABLET | Freq: Every day | ORAL | Status: DC
  Administered 2021-12-14 – 2021-12-17 (×4): 50 mg via ORAL
  Filled 2021-12-14 (×4): qty 1

## 2021-12-14 MED ORDER — OXYCODONE HCL 5 MG PO TABS: 5.0000 mg | ORAL_TABLET | Freq: Four times a day (QID) | ORAL | Status: DC | PRN

## 2021-12-14 MED ORDER — GERHARDT'S BUTT CREAM
TOPICAL_CREAM | CUTANEOUS | Status: DC | PRN
  Filled 2021-12-14: qty 1

## 2021-12-14 MED ORDER — ASPIRIN 81 MG PO TBEC
81.0000 mg | DELAYED_RELEASE_TABLET | Freq: Every day | ORAL | Status: DC
  Administered 2021-12-14 – 2021-12-17 (×4): 81 mg via ORAL
  Filled 2021-12-14 (×4): qty 1

## 2021-12-14 MED ORDER — HYDROMORPHONE HCL 1 MG/ML IJ SOLN: 0.5000 mg | INTRAMUSCULAR | Status: DC | PRN

## 2021-12-14 MED ORDER — DELFLEX-LC/1.5% DEXTROSE 344 MOSM/L IP SOLN: INTRAPERITONEAL | Status: DC

## 2021-12-14 MED ORDER — VANCOMYCIN VARIABLE DOSE PER UNSTABLE RENAL FUNCTION (PHARMACIST DOSING): Status: DC

## 2021-12-14 MED ORDER — LACTATED RINGERS IV BOLUS
1000.0000 mL | Freq: Once | INTRAVENOUS | Status: AC
Start: 2021-12-14 — End: 2021-12-14
  Administered 2021-12-14: 1000 mL via INTRAVENOUS

## 2021-12-14 MED ORDER — PROCHLORPERAZINE EDISYLATE 10 MG/2ML IJ SOLN
10.0000 mg | Freq: Four times a day (QID) | INTRAMUSCULAR | Status: DC | PRN
  Filled 2021-12-14: qty 2

## 2021-12-14 MED ORDER — SODIUM CHLORIDE 0.9 % IV SOLN
2.0000 g | INTRAVENOUS | Status: DC
  Administered 2021-12-14: 2 g via INTRAVENOUS
  Filled 2021-12-14: qty 12.5

## 2021-12-14 MED ORDER — VANCOMYCIN HCL 2000 MG/400ML IV SOLN
2000.0000 mg | Freq: Once | INTRAVENOUS | Status: AC
  Administered 2021-12-14: 2000 mg via INTRAVENOUS
  Filled 2021-12-14: qty 400

## 2021-12-14 MED ORDER — SPIRONOLACTONE 25 MG PO TABS
25.0000 mg | ORAL_TABLET | Freq: Every day | ORAL | Status: DC
  Administered 2021-12-14 – 2021-12-17 (×4): 25 mg via ORAL
  Filled 2021-12-14 (×4): qty 1

## 2021-12-14 MED ORDER — FINASTERIDE 5 MG PO TABS
5.0000 mg | ORAL_TABLET | Freq: Every day | ORAL | Status: DC
  Administered 2021-12-14 – 2021-12-17 (×4): 5 mg via ORAL
  Filled 2021-12-14 (×4): qty 1

## 2021-12-14 MED ORDER — POTASSIUM CHLORIDE 10 MEQ/100ML IV SOLN
10.0000 meq | INTRAVENOUS | Status: AC
  Administered 2021-12-14 (×3): 10 meq via INTRAVENOUS
  Filled 2021-12-14 (×3): qty 100

## 2021-12-14 NOTE — ED Notes (Signed)
Dialysis yeam here for cloudy peritoneal fluid

## 2021-12-14 NOTE — Progress Notes (Signed)
Pharmacy Antibiotic Note  [name] is a 76 y.o. male admitted on 12/13/2021 with  ?peritonitis .  Pharmacy has been consulted for Vancomycin/Cefepime dosing. Pt is on peritoneal dialysis at home. WBC is mildly elevated.   Plan: Vancomycin 2000 mg IV x 1 >>>Check vancomycin level in 72 hours Cefepime 2g IV q48h Trend WBC, temp F/U infectious work-up   Temp (24hrs), Avg:97.9 F (36.6 C), Min:97.9 F (36.6 C), Max:97.9 F (36.6 C)  Recent Labs  Lab 12/13/21 2350  WBC 14.6*  CREATININE 6.74*  LATICACIDVEN 3.1*    Estimated Creatinine Clearance: 10.8 mL/min (A) (by C-G formula based on SCr of 6.74 mg/dL (H)).    No Known Allergies  Narda Bonds, PharmD, BCPS Clinical Pharmacist Phone: [phone]

## 2021-12-14 NOTE — Consult Note (Signed)
Renal Service Consult Note [name]  [name] 12/14/2021 Austin Blazing, MD Requesting Physician: Dr. Waldron Labs  Reason for Consult: ESRD pt on PD w/ cloudy fluid HPI: The patient is a 76 y.o. year-old w/ hx of AS sp AVR, DJD, CAD sp CABGx1, COPD, depression, ESRD on PD, HTN, gout, HL, hx PE who developed cloudy PD fluid which was noted yesterday. Their PD nurse instructed them to go to ED. Also has hx of CVA and some speech and swallowing difficulties. Pt had been afebrile. Also hx of atrial flutter. In ED PD fluid was sampled and sent to lab and patient received IV abx.  PD cell count was 8300 and gram stain showed wbc's and GPC. Pt was admitted. We are asked to see for CCPD.    Pt seen in room.  Family states he hasn't been "himself" for about 6- 8 wks, they saw a neurologist in OP setting I believe, and they say that he "had a stroke" but they say that they didn't understand why he hasn't eaten for 8-10 days, and they don't seem comfortable with his overall neuro status. Patient has not been confused. Speech is hard to discern at times. Pt states he is having significant problems swallowing food. Family notes that "he is taking his pills though". They live in [place]. On PD for about 1 year, no HD access placed yet. Pt answers questions relatively appropriately.   ROS - denies CP, no joint pain, no HA, no blurry vision, no rash, no diarrhea, no nausea/ vomiting, no dysuria, no difficulty voiding   Past Medical History  Past Medical History:  Diagnosis Date   Aortic stenosis    Status post AVR, 25 mm Edwards pericardial Magna-Ease valve 2014   Arthritis    CAD (coronary artery disease)    Status post SVG to OM 2014   COPD (chronic obstructive pulmonary disease) ([place])    Depression    Dialysis patient ([place])    on PD 7 night weekly at home   ESRD on peritoneal dialysis [facility])    Essential hypertension    Gout    Hemorrhoid    Hypertension    Hypothyroidism     Kidney stones    Lumbar spondylolysis    Mixed hyperlipidemia    Orthostatic hypotension    Osteoarthritis    Pulmonary emboli ([place]) 08/21/2020   Syncope    Vitamin D deficiency    Past Surgical History  Past Surgical History:  Procedure Laterality Date   ANGIOPASTY     AORTIC VALVE REPLACEMENT  07/11/2012   Procedure: AORTIC VALVE REPLACEMENT (AVR);  Surgeon: Gaye Pollack, MD;  Location: [place];  Service: Open Heart Surgery;  Laterality: N/A;   AV FISTULA PLACEMENT Left 05/30/2020   Procedure: ARTERIOVENOUS (AV) FISTULA CREATION LEFT;  Surgeon: [name], MD;  Location: [facility];  Service: Vascular;  Laterality: Left;   CARDIAC CATHETERIZATION     CAROTID ENDARTERECTOMY Left    CATARACT EXTRACTION W/PHACO Right 06/09/2021   Procedure: CATARACT EXTRACTION RIGHT EYE W/ PHACO AND INTRAOCULAR LENS PLACEMENT ([place]);  Surgeon: Baruch Goldmann, MD;  Location: [facility];  Service: Ophthalmology;  Laterality: Right;  CDE: 12.97   CATARACT EXTRACTION W/PHACO Left 06/27/2021   Procedure: CATARACT EXTRACTION PHACO AND INTRAOCULAR LENS PLACEMENT ([place]) WITH IACCESS GONIOTOMY;  Surgeon: Baruch Goldmann, MD;  Location: [facility];  Service: Ophthalmology;  Laterality: Left;  CDE: 10.08   CIRCUMCISION     COLONOSCOPY WITH PROPOFOL N/A 07/16/2020  Procedure: COLONOSCOPY WITH PROPOFOL;  Surgeon: Doran Stabler, MD;  Location: [place];  Service: Gastroenterology;  Laterality: N/A;   CORONARY ARTERY BYPASS GRAFT  07/11/2012   Procedure: CORONARY ARTERY BYPASS GRAFTING (CABG);  Surgeon: Gaye Pollack, MD;  Location: [place];  Service: Open Heart Surgery;  Laterality: N/A;  CABG x one,  using right leg greater saphenous vein harvested endoscopically   ESOPHAGOGASTRODUODENOSCOPY (EGD) WITH PROPOFOL N/A 07/15/2020   Procedure: ESOPHAGOGASTRODUODENOSCOPY (EGD) WITH PROPOFOL;  Surgeon: Doran Stabler, MD;  Location: [place];  Service: Gastroenterology;  Laterality: N/A;   HOT HEMOSTASIS N/A  07/16/2020   Procedure: HOT HEMOSTASIS (ARGON PLASMA COAGULATION/BICAP);  Surgeon: Doran Stabler, MD;  Location: [place];  Service: Gastroenterology;  Laterality: N/A;   INSERTION OF ANTERIOR SEGMENT AQUEOUS DRAINAGE DEVICE (ISTENT) Right 06/09/2021   Procedure: INSERTION OF RIGHT EYE ANTERIOR SEGMENT AQUEOUS DRAINAGE DEVICE (ISTENT);  Surgeon: Baruch Goldmann, MD;  [facility];  Service: Ophthalmology;  Laterality: Right;  CDE: 12.97   INTRAOPERATIVE TRANSESOPHAGEAL ECHOCARDIOGRAM  07/11/2012   Procedure: INTRAOPERATIVE TRANSESOPHAGEAL ECHOCARDIOGRAM;  Surgeon: Gaye Pollack, MD;  Location: [facility];  Service: Open Heart Surgery;  Laterality: N/A;   IR PERC TUN PERIT CATH WO PORT S&I /IMAG  06/13/2020   IR REMOVAL TUN CV CATH W/O FL  07/26/2020   IR US GUIDE VASC ACCESS RIGHT  06/13/2020   JOINT REPLACEMENT Bilateral    knees   TEE WITHOUT CARDIOVERSION N/A 06/12/2020   Procedure: TRANSESOPHAGEAL ECHOCARDIOGRAM (TEE);  Surgeon: Skeet Latch, MD;  Location[facility] ENDOSCOPY;  Service: Cardiovascular;  Laterality: N/A;   Family History  Family History  Problem Relation Age of Onset   Heart disease Mother    Heart disease Father    Hypertension Father    Colon cancer Neg Hx    Esophageal cancer Neg Hx    Stomach cancer Neg Hx    Social History  reports that he quit smoking about 11 years ago. His smoking use included cigarettes. He has a 150.00 pack-year smoking history. He has quit using smokeless tobacco. He reports that he does not currently use alcohol. He reports that he does not use drugs. Allergies No Known Allergies Home medications Prior to Admission medications   Medication Sig Start Date End Date Taking? Authorizing Provider  albuterol (PROVENTIL HFA;VENTOLIN HFA) 108 (90 BASE) MCG/ACT inhaler Inhale 2 puffs into the lungs every 6 (six) hours as needed for wheezing or shortness of breath.    Yes [provider]  ALPRAZolam (XANAX) 0.25 MG tablet Take 0.25  mg by mouth at bedtime as needed for anxiety.   Yes [provider]  aspirin EC 81 MG tablet Take 81 mg by mouth daily. Swallow whole.   Yes [provider]  AURYXIA 1 GM 210 MG(Fe) tablet Take 210 mg by mouth 3 (three) times daily. 10/23/20  Yes [provider]  calcium acetate, Phos Binder, (PHOSLYRA) 667 MG/5ML SOLN Take 1,334 mg by mouth 3 (three) times daily with meals.   Yes [provider]  collagenase (SANTYL) 250 UNIT/GM ointment Apply 1 application. topically daily as needed (sore).   Yes [provider]  cyanocobalamin (,VITAMIN B-12,) 1000 MCG/ML injection cyanocobalamin (vit B-12) 1,000 mcg/mL injection solution   Yes [provider]  docusate sodium (COLACE) 100 MG capsule Take 200 mg by mouth 2 (two) times daily as needed for mild constipation.   Yes [provider]  finasteride (PROSCAR) 5 MG tablet Take 5  mg by mouth daily.   Yes [provider]  fluticasone (FLONASE) 50 MCG/ACT nasal spray Place 1 spray into both nostrils daily as needed for allergies.   Yes [provider]  L-Lysine 1000 MG TABS Take 1 tablet by mouth daily.   Yes [provider]  levothyroxine (SYNTHROID) 175 MCG tablet Take 175 mcg by mouth daily before breakfast.  05/12/12  Yes [provider]  Multiple Vitamin (MULTIVITAMIN) tablet Take 1 tablet by mouth daily.   Yes [provider]  Omega-3 Fatty Acids (FISH OIL PO) Take 1 capsule by mouth 2 (two) times daily.   Yes [provider]  pantoprazole (PROTONIX) 40 MG tablet Take 40 mg by mouth daily.   Yes [provider]  polyethylene glycol (MIRALAX / GLYCOLAX) 17 g packet Take 17 g by mouth daily. Patient taking differently: Take 17 g by mouth daily as needed for mild constipation or moderate constipation. 06/14/20  Yes Regalado, Belkys A, MD  potassium chloride (KLOR-CON) 10 MEQ tablet Take 10 mEq by mouth daily. 12/08/21  Yes [provider]  sertraline (ZOLOFT) 50 MG tablet Take 50 mg by mouth daily.   Yes [provider]  simvastatin (ZOCOR) 40 MG tablet Take 1 tablet (40 mg total) by mouth every evening. 11/07/21  Yes Strader, Fransisco Hertz, PA-C  spironolactone (ALDACTONE) 25 MG tablet Take 25 mg by mouth daily.   Yes [provider]  torsemide (DEMADEX) 100 MG tablet Take 1 tablet by mouth 2 (two) times daily. 03/26/21  Yes [provider]  TRINTELLIX 20 MG TABS tablet Take 20 mg by mouth daily. 03/19/21  Yes [provider]     Vitals:   12/14/21 1287 12/14/21 0845 12/14/21 0910 12/14/21 1135  BP:  113/77 126/80 116/69  Pulse:  68 77 74  Resp:  '20 18 18  '$ Temp: 98 F (36.7 C) 98.4 F (36.9 C) 97.8 F (36.6 C) 99.3 F (37.4 C)  TempSrc:  Axillary Oral Oral  SpO2:  100% 95% 99%  Weight:   102 kg   Height:   '5\' 8"'$  (1.727 m)    Exam Gen alert, no distress, +slurred speech, no aphasia No rash, cyanosis or gangrene Sclera anicteric, throat clear  No jvd or bruits Chest clear bilat to bases, no rales/ wheezing RRR no MRG Abd soft ntnd no mass or ascites +bs, PD cath mid abdomen w/ clean exit site GU normal male MS no joint effusions or deformity Ext no LE or UE edema, no wounds or ulcers Neuro is alert, Ox 3 , nf   Home meds include - albuterol, aspirin, calcium acetate 2 ac, finasteride, levothyroxine, pantoprazole, sertraline, simvastatin, spironolactone 25     OP PD: is in with [facility] , [facility], no records available over the w/e   Assessment/ Plan: PD catheter- related peritonitis - TNC from 6/17 sample was 8306 with 97% pmn's and gram stain showed GPC.  He has not got a lot of pain, but is tender in the lower abdomen. Getting IV abx now w/ gram + and gram neg coverage. When culture returns we can taper the antibiotics, and upon dc he will most likely get intraperitoneal antibiotics through his renal provider. We can collect another cell count maybe  Tuesday morning to compare to the original. Will follow.  ESRD - on CCPD x 1 year. Get records in am. Pt is from [place]. For tonight plan 5 exchanges, will use 2- 2.5 L dwells.  Volume -  looks euvolemic, w/ low normal BP's. Will use all 1.5% fluids tonight. Dtr says he is not eating "for the past 1-2 weeks".  Slurred speech/ dysphagia - hx of recent CVA per family, possibly was picked up in the OP setting. Not sure details. They are saying he is "not the same person" that he was before whatever event happened, this is over the last 1-2 mos.  Hypothyroidism - per pmd MBD ckd - CCa and phos in range, cont phoslo as binder.  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       Kelly Splinter  MD 12/14/2021, 3:29 PM Recent Labs  Lab 12/13/21 2350 12/14/21 0558  HGB 15.0 13.4  ALBUMIN 3.3* 2.7*  CALCIUM 9.8 8.9  PHOS  --  3.1  CREATININE 6.74* 6.53*  K 2.7* 3.1*

## 2021-12-14 NOTE — H&P (Signed)
History and Physical  [name] JOA:[id number] DOB: March 17, 1946 DOA: 12/13/2021  Referring physician: Yvonne Kendall, EDP  PCP: Celene Squibb, MD  Outpatient Specialists: Nephrology, Neurology, GI Patient coming from: Home  Chief Complaint: Abdominal pain, cloudy dialysate fluid.   HPI: [name] is a 76 y.o. male with medical history significant for ESRD on peritoneal dialysis (makes urine), anemia of chronic disease, prior CVA, dysphagia on pured diet, paroxysmal A-flutter, followed by cardiology (was on Eliquis, discontinued per cardiology), coronary artery disease, aortic stenosis status post AVR with bioprosthetic valve, moderate MS and moderate TR, essential hypertension, hyperlipidemia, hypothyroidism, history of PE, chronic constipation, followed by GI, chronic anxiety/depression who presented to [name] from home with complaints of abdominal pain and cloudy dialysate fluid.  Patient's family called PD nurse who recommended to present to the ED for further evaluation given concern for peritonitis.  Peritoneal fluid analysis was done in the ED.  Few gram-positive cocci seen on Gram stain with moderate WBC present, predominantly PMN.  The patient was started on IV vancomycin and cefepime empirically in the ED.  Recently treated with antibiotics for urinary tract infection that presented with hematuria, completed course of antibiotics a week ago.  Hematuria returned yesterday and has now cleared at the time of this visit.  TRH, hospitalist service was asked to admit.  ED Course: Tmax 97.9.  BP 125/88, pulse 66, respiratory 17, saturation 96% on room air.  Lab studies remarkable for serum sodium 134, potassium 2.7, serum glucose 187, BUN 40, creatinine 6.74, serum bicarb 26, anion gap 16.  AST 54.  Lactic acid 3.1, 2.8.  WBC 14.6, neutrophil count 12.9.  Review of Systems: Review of systems as noted in the HPI. All other systems reviewed and are negative.   Past Medical History:  Diagnosis  Date   Aortic stenosis    Status post AVR, 25 mm Edwards pericardial Magna-Ease valve 2014   Arthritis    CAD (coronary artery disease)    Status post SVG to OM 2014   COPD (chronic obstructive pulmonary disease) ([place])    Depression    Dialysis patient ([place])    on PD 7 night weekly at home   ESRD on peritoneal dialysis [facility])    Essential hypertension    Gout    Hemorrhoid    Hypertension    Hypothyroidism    Kidney stones    Lumbar spondylolysis    Mixed hyperlipidemia    Orthostatic hypotension    Osteoarthritis    Pulmonary emboli ([place]) 08/21/2020   Syncope    Vitamin D deficiency    Past Surgical History:  Procedure Laterality Date   ANGIOPASTY     AORTIC VALVE REPLACEMENT  07/11/2012   Procedure: AORTIC VALVE REPLACEMENT (AVR);  Surgeon: Gaye Pollack, MD;  Location: [place];  Service: Open Heart Surgery;  Laterality: N/A;   AV FISTULA PLACEMENT Left 05/30/2020   Procedure: ARTERIOVENOUS (AV) FISTULA CREATION LEFT;  Surgeon: [name], MD;  Location: [facility];  Service: Vascular;  Laterality: Left;   CARDIAC CATHETERIZATION     CAROTID ENDARTERECTOMY Left    CATARACT EXTRACTION W/PHACO Right 06/09/2021   Procedure: CATARACT EXTRACTION RIGHT EYE W/ PHACO AND INTRAOCULAR LENS PLACEMENT ([place]);  Surgeon: Baruch Goldmann, MD;  Location: [facility];  Service: Ophthalmology;  Laterality: Right;  CDE: 12.97   CATARACT EXTRACTION W/PHACO Left 06/27/2021   Procedure: CATARACT EXTRACTION PHACO AND INTRAOCULAR LENS PLACEMENT ([place]) WITH IACCESS GONIOTOMY;  Surgeon: Baruch Goldmann, MD;  Location:  [facility];  Service: Ophthalmology;  Laterality: Left;  CDE: 10.08   CIRCUMCISION     COLONOSCOPY WITH PROPOFOL N/A 07/16/2020   Procedure: COLONOSCOPY WITH PROPOFOL;  Surgeon: Doran Stabler, MD;  Location: [place];  Service: Gastroenterology;  Laterality: N/A;   CORONARY ARTERY BYPASS GRAFT  07/11/2012   Procedure: CORONARY ARTERY BYPASS GRAFTING (CABG);  Surgeon: Gaye Pollack, MD;   Location: [place];  Service: Open Heart Surgery;  Laterality: N/A;  CABG x one,  using right leg greater saphenous vein harvested endoscopically   ESOPHAGOGASTRODUODENOSCOPY (EGD) WITH PROPOFOL N/A 07/15/2020   Procedure: ESOPHAGOGASTRODUODENOSCOPY (EGD) WITH PROPOFOL;  Surgeon: Doran Stabler, MD;  Location: [place];  Service: Gastroenterology;  Laterality: N/A;   HOT HEMOSTASIS N/A 07/16/2020   Procedure: HOT HEMOSTASIS (ARGON PLASMA COAGULATION/BICAP);  Surgeon: Doran Stabler, MD;  Location: [place];  Service: Gastroenterology;  Laterality: N/A;   INSERTION OF ANTERIOR SEGMENT AQUEOUS DRAINAGE DEVICE (ISTENT) Right 06/09/2021   Procedure: INSERTION OF RIGHT EYE ANTERIOR SEGMENT AQUEOUS DRAINAGE DEVICE (ISTENT);  Surgeon: Baruch Goldmann, MD;  [facility]: [facility];  Service: Ophthalmology;  Laterality: Right;  CDE: 12.97   INTRAOPERATIVE TRANSESOPHAGEAL ECHOCARDIOGRAM  07/11/2012   Procedure: INTRAOPERATIVE TRANSESOPHAGEAL ECHOCARDIOGRAM;  Surgeon: Gaye Pollack, MD;  Location: [place] [facility];  Service: Open Heart Surgery;  Laterality: N/A;   IR PERC TUN PERIT CATH WO PORT S&I /IMAG  06/13/2020   IR REMOVAL TUN CV CATH W/O FL  07/26/2020   IR US GUIDE VASC ACCESS RIGHT  06/13/2020   JOINT REPLACEMENT Bilateral    knees   TEE WITHOUT CARDIOVERSION N/A 06/12/2020   Procedure: TRANSESOPHAGEAL ECHOCARDIOGRAM (TEE);  Surgeon: Skeet Latch, MD;  Location: [place];  Service: Cardiovascular;  Laterality: N/A;    Social History:  reports that he quit smoking about 11 years ago. His smoking use included cigarettes. He has a 150.00 pack-year smoking history. He has quit using smokeless tobacco. He reports that he does not currently use alcohol. He reports that he does not use drugs.   No Known Allergies  Family History  Problem Relation Age of Onset   Heart disease Mother    Heart disease Father    Hypertension Father    Colon cancer Neg Hx    Esophageal cancer Neg Hx    Stomach  cancer Neg Hx       Prior to Admission medications   Medication Sig Start Date End Date Taking? Authorizing Provider  albuterol (PROVENTIL HFA;VENTOLIN HFA) 108 (90 BASE) MCG/ACT inhaler Inhale 2 puffs into the lungs every 6 (six) hours as needed for wheezing or shortness of breath.     [provider]  aspirin EC 81 MG tablet Take 81 mg by mouth daily. Swallow whole.    [provider]  AURYXIA 1 GM 210 MG(Fe) tablet Take 210 mg by mouth 3 (three) times daily. 10/23/20   [provider]  calcium acetate, Phos Binder, (PHOSLYRA) 667 MG/5ML SOLN Take 1,334 mg by mouth 3 (three) times daily with meals.    [provider]  collagenase (SANTYL) 250 UNIT/GM ointment Apply 1 application. topically daily as needed (sore).    [provider]  cyanocobalamin (,VITAMIN B-12,) 1000 MCG/ML injection cyanocobalamin (vit B-12) 1,000 mcg/mL injection solution    [provider]  docusate sodium (COLACE) 100 MG capsule Take 200 mg by mouth 2 (two) times daily as needed for mild constipation.    [provider]  finasteride (PROSCAR) 5 MG  tablet Take 5 mg by mouth daily.    [provider]  fluticasone (FLONASE) 50 MCG/ACT nasal spray Place 1 spray into both nostrils daily as needed.    [provider]  L-Lysine 1000 MG TABS Take 1 tablet by mouth daily.    [provider]  levothyroxine (SYNTHROID) 175 MCG tablet Take 175 mcg by mouth daily before breakfast.  05/12/12   [provider]  Multiple Vitamin (MULTIVITAMIN) tablet Take 1 tablet by mouth daily.    [provider]  Omega-3 Fatty Acids (FISH OIL PO) Take 1 capsule by mouth 2 (two) times daily.    [provider]  pantoprazole (PROTONIX) 40 MG tablet Take 40 mg by mouth daily.    [provider]  polyethylene glycol (MIRALAX / GLYCOLAX) 17 g packet Take 17 g by mouth daily. 06/14/20   Regalado, Belkys A, MD  sertraline (ZOLOFT) 50  MG tablet Take 50 mg by mouth daily.    [provider]  simvastatin (ZOCOR) 40 MG tablet Take 1 tablet (40 mg total) by mouth every evening. 11/07/21   Strader, Fransisco Hertz, PA-C  spironolactone (ALDACTONE) 25 MG tablet Take 25 mg by mouth daily.    [provider]  torsemide (DEMADEX) 100 MG tablet Take 1 tablet by mouth 2 (two) times daily. 03/26/21   [provider]  TRINTELLIX 20 MG TABS tablet Take 20 mg by mouth daily. 03/19/21   [provider]    Physical Exam: BP 127/76   Pulse 69   Temp 97.9 F (36.6 C) (Oral)   Resp 16   SpO2 99%   General: 76 y.o. year-old male well developed well nourished in no acute distress.  Alert and interactive. Cardiovascular: Regular rate and rhythm with no rubs or gallops.  No thyromegaly or JVD noted.  No lower extremity edema. 2/4 pulses in all 4 extremities. Respiratory: Clear to auscultation with no wheezes or rales.  Poor inspiratory effort. Abdomen: Distended, diffusely tender with palpation.  With normal bowel sounds x4 quadrants. Muskuloskeletal: No cyanosis, clubbing or edema noted bilaterally Neuro: CN II-XII intact, strength, sensation, reflexes Skin: No ulcerative lesions noted or rashes Psychiatry: Mood is appropriate for condition and setting          Labs on Admission:  Basic Metabolic Panel: Recent Labs  Lab 12/13/21 2350  NA 134*  K 2.7*  CL 92*  CO2 26  GLUCOSE 187*  BUN 40*  CREATININE 6.74*  CALCIUM 9.8   Liver Function Tests: Recent Labs  Lab 12/13/21 2350  AST 54*  ALT 21  ALKPHOS 85  BILITOT 0.6  PROT 7.7  ALBUMIN 3.3*   Recent Labs  Lab 12/13/21 2350  LIPASE 38   No results for input(s): "AMMONIA" in the last 168 hours. CBC: Recent Labs  Lab 12/13/21 2350  WBC 14.6*  NEUTROABS 12.9*  HGB 15.0  HCT 41.1  MCV 95.4  PLT 198   Cardiac Enzymes: No results for input(s): "CKTOTAL", "CKMB", "CKMBINDEX", "TROPONINI" in the last 168 hours.  BNP (last 3  results) No results for input(s): "BNP" in the last 8760 hours.  ProBNP (last 3 results) No results for input(s): "PROBNP" in the last 8760 hours.  CBG: No results for input(s): "GLUCAP" in the last 168 hours.  Radiological Exams on Admission: DG Chest Portable 1 View  Result Date: 12/14/2021 CLINICAL DATA:  ams, abdominal pain EXAM: PORTABLE CHEST 1 VIEW COMPARISON:  Chest x-ray 11/19/2021 FINDINGS: Cardiomegaly. The heart and mediastinal contours  are unchanged. Aortic calcification. Low lung volumes. Slightly improved aeration of the lower lungs with persistent retrocardiac and right middle lobe airspace opacity. No pulmonary edema. No pleural effusion. No pneumothorax. No acute osseous abnormality. IMPRESSION: Low lung volumes with slightly improved aeration of the lower lungs with persistent retrocardiac and right middle lobe airspace opacity. Followup PA and lateral chest X-ray is recommended in 3-4 weeks following therapy to ensure resolution and exclude underlying malignancy. Electronically Signed   By: Iven Finn M.D.   On: 12/14/2021 00:00    EKG: I independently viewed the EKG done and my findings are as followed: A flutter rate of 79.  Nonspecific ST-T changes.  QTc 608.  Assessment/Plan Present on Admission:  Peritonitis [facility])  Principal Problem:   Peritonitis ([place])  Abdominal pain, cloudy dialysate with concern for peritonitis, POA Peritoneal fluid with gram-positive cocci in on Gram stain and moderate WBC predominantly PMNs, follow cultures, body fluid and blood cultures. Leukocytosis, WBC 14.6 K Monitor fever curve and WBC Analgesics as needed  ESRD on PD with concern for peritonitis Consult nephrology in the morning to assist with hemodialysis Volume status and electrolytes abnormalities addressed with hemodialysis  Hypervolemic hyponatremia Serum sodium 134 Volume status addressed with hemodialysis  Hypokalemia Serum potassium 2.7 Repleted intravenously  while awaiting for hemodialysis Repeat renal panel in the morning  Dysphagia Patient on pured diet at home with no restrictions per his wife, no-salt or carb-modified restrictions. Aspiration precautions in place  History of CVA Resume home regimen Fall and aspiration precautions in place  Hyperglycemia Serum glucose 187 Obtain A1c Insulin sliding scale Avoid hypoglycemia in the setting of insulin use and ESRD  Isolated elevated AST AST mildly elevated 54 Trend Repeat CMP in the morning   Critical care time: 65 minutes   DVT prophylaxis: Subcu heparin 3 times daily  Code Status: DNR  Family Communication: Wife at bedside  Disposition Plan: Admitted to progressive unit  Consults called: Please consult nephrology in the morning to assist with hemodialysis.  Admission status: Inpatient status.   Status is: Inpatient The patient requires at least 2 midnights for further evaluation and treatment of present condition.   Kayleen Memos MD [facility] Pager [phone]  If 7PM-7AM, please contact night-coverage [email or web address] Password [email or web address]  12/14/2021, 4:09 AM

## 2021-12-14 NOTE — ED Notes (Signed)
Pt had a 5 beat run of wide complex beats while having blood drawn. Cardiac strip printed and given to provider. Pt also vomiting

## 2021-12-14 NOTE — ED Provider Notes (Signed)
[facility] EMERGENCY DEPARTMENT Provider Note   CSN: [id number] Arrival date & time: 12/13/21  2243     History  Chief Complaint  Patient presents with   Abdominal Pain    [name] is a 76 y.o. 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  Patient is afebrile. History provided primarily by patient's wife [name]; she states that he was recently treated with antibiotics for urinary tract infection that presented with hematuria.  She states unfortunately that his hematuria returned yesterday.  When [name] noticed that the patient's peritoneal dialysis fluid was cloudy she stopped his cycle and contacted his PD nurse recommended that they hold the fluid in his belly until he can present to the emergency department given concern for peritonitis.  According to [name], [name] has been steadily declining since February of this year but more so significantly in the last week.  He has only been taking approximate 12 ounces of liquids per day and she states she has a total of approximately 1 cup of food in his system for the last 5 days.  Bowel movement earlier today.  Also endorses decline in his mental status.  Patient is not under the care of hospice as he is continuing peritoneal dialysis.  Patient's family initially declined palliative care consult stating that they prefer to manage him themselves, however they are in close contact with their caseworker regarding palliative intervention.  According to the patient's wife who is his POA at the bedside he is a DNR; patient able to nod yes in agreement with this.  In addition to the above listed history patient has history of hypertension, COPD, hypothyroidism, hyperlipidemia, MGUS.  He is not anticoagulated.  HPI     Home Medications Prior to Admission  medications   Medication Sig Start Date End Date Taking? Authorizing Provider  albuterol (PROVENTIL HFA;VENTOLIN HFA) 108 (90 BASE) MCG/ACT inhaler Inhale 2 puffs into the lungs every 6 (six) hours as needed for wheezing or shortness of breath.    Yes [provider]  ALPRAZolam (XANAX) 0.25 MG tablet Take 0.25 mg by mouth at bedtime as needed for anxiety.   Yes [provider]  aspirin EC 81 MG tablet Take 81 mg by mouth daily. Swallow whole.   Yes [provider]  AURYXIA 1 GM 210 MG(Fe) tablet Take 210 mg by mouth 3 (three) times daily. 10/23/20  Yes [provider]  calcium acetate, Phos Binder, (PHOSLYRA) 667 MG/5ML SOLN Take 1,334 mg by mouth 3 (three) times daily with meals.   Yes [provider]  collagenase (SANTYL) 250 UNIT/GM ointment Apply 1 application. topically daily as needed (sore).   Yes [provider]  cyanocobalamin (,VITAMIN B-12,) 1000 MCG/ML injection cyanocobalamin (vit B-12) 1,000 mcg/mL injection solution   Yes [provider]  docusate sodium (COLACE) 100 MG capsule Take 200 mg by mouth 2 (two) times daily as needed for mild constipation.   Yes [provider]  finasteride (PROSCAR) 5 MG tablet Take 5 mg by mouth daily.   Yes [provider]  fluticasone (FLONASE) 50 MCG/ACT nasal spray Place 1 spray into both nostrils daily as needed for allergies.   Yes [provider]  L-Lysine 1000 MG TABS Take 1 tablet by mouth daily.   Yes [provider]  levothyroxine (SYNTHROID) 175 MCG tablet Take 175  mcg by mouth daily before breakfast.  05/12/12  Yes [provider]  Multiple Vitamin (MULTIVITAMIN) tablet Take 1 tablet by mouth daily.   Yes [provider]  Omega-3 Fatty Acids (FISH OIL PO) Take 1 capsule by mouth 2 (two) times daily.   Yes [provider]  pantoprazole (PROTONIX) 40 MG tablet Take 40 mg by mouth daily.   Yes [provider]   polyethylene glycol (MIRALAX / GLYCOLAX) 17 g packet Take 17 g by mouth daily. Patient taking differently: Take 17 g by mouth daily as needed for mild constipation or moderate constipation. 06/14/20  Yes Regalado, Belkys A, MD  potassium chloride (KLOR-CON) 10 MEQ tablet Take 10 mEq by mouth daily. 12/08/21  Yes [provider]  sertraline (ZOLOFT) 50 MG tablet Take 50 mg by mouth daily.   Yes [provider]  simvastatin (ZOCOR) 40 MG tablet Take 1 tablet (40 mg total) by mouth every evening. 11/07/21  Yes Strader, Fransisco Hertz, PA-C  spironolactone (ALDACTONE) 25 MG tablet Take 25 mg by mouth daily.   Yes [provider]  torsemide (DEMADEX) 100 MG tablet Take 1 tablet by mouth 2 (two) times daily. 03/26/21  Yes [provider]  TRINTELLIX 20 MG TABS tablet Take 20 mg by mouth daily. 03/19/21  Yes [provider]      Allergies    Patient has no known allergies.    Review of Systems   Review of Systems  Unable to perform ROS: Mental status change  Constitutional:  Positive for appetite change.  Gastrointestinal:  Positive for abdominal pain.  Genitourinary:  Positive for hematuria.   Physical Exam Updated Vital Signs BP 121/87   Pulse 69   Temp 97.9 F (36.6 C) (Oral)   Resp 19   SpO2 93%  Physical Exam Vitals and nursing note reviewed.  Constitutional:      Appearance: He is obese. He is not toxic-appearing.  HENT:     Head: Normocephalic and atraumatic.     Mouth/Throat:     Mouth: Mucous membranes are moist.     Pharynx: Oropharynx is clear. Uvula midline. No oropharyngeal exudate or posterior oropharyngeal erythema.  Eyes:     General: Lids are normal. Vision grossly intact.        Right eye: No discharge.        Left eye: No discharge.     Conjunctiva/sclera: Conjunctivae normal.     Pupils: Pupils are equal, round, and reactive to light.  Neck:     Trachea: Trachea normal.  Cardiovascular:     Rate and Rhythm: Normal rate  and regular rhythm.     Pulses: Normal pulses.     Heart sounds: Murmur heard.  Pulmonary:     Effort: Pulmonary effort is normal. No tachypnea, bradypnea, accessory muscle usage or respiratory distress.     Breath sounds: Normal breath sounds. No wheezing or rales.  Chest:     Chest wall: No mass, lacerations, deformity, swelling, tenderness, crepitus or edema.  Abdominal:     General: Bowel sounds are normal. There is distension.     Palpations: Abdomen is soft.     Tenderness: There is generalized abdominal tenderness. There is rebound.  Musculoskeletal:        General: No deformity.     Cervical back: Normal range of motion and neck supple.     Right lower leg: No edema.     Left lower leg: No edema.  Lymphadenopathy:  Cervical: No cervical adenopathy.  Skin:    General: Skin is warm and dry.     Capillary Refill: Capillary refill takes less than 2 seconds.  Neurological:     Mental Status: He is alert. Mental status is at baseline.     GCS: GCS eye subscore is 4. GCS verbal subscore is 4. GCS motor subscore is 5.  Psychiatric:        Mood and Affect: Mood normal.     ED Results / Procedures / Treatments   Labs (all labs ordered are listed, but only abnormal results are displayed) Labs Reviewed  COMPREHENSIVE METABOLIC PANEL - Abnormal; Notable for the following components:      Result Value   Sodium 134 (*)    Potassium 2.7 (*)    Chloride 92 (*)    Glucose, Bld 187 (*)    BUN 40 (*)    Creatinine, Ser 6.74 (*)    Albumin 3.3 (*)    AST 54 (*)    GFR, Estimated 8 (*)    Anion gap 16 (*)    All other components within normal limits  CBC WITH DIFFERENTIAL/PLATELET - Abnormal; Notable for the following components:   WBC 14.6 (*)    MCH 34.8 (*)    MCHC 36.5 (*)    Neutro Abs 12.9 (*)    Abs Immature Granulocytes 0.13 (*)    All other components within normal limits  LACTIC ACID, PLASMA - Abnormal; Notable for the following components:   Lactic Acid, Venous  3.1 (*)    All other components within normal limits  LACTIC ACID, PLASMA - Abnormal; Notable for the following components:   Lactic Acid, Venous 2.8 (*)    All other components within normal limits  URINALYSIS, ROUTINE W REFLEX MICROSCOPIC - Abnormal; Notable for the following components:   Color, Urine AMBER (*)    APPearance CLOUDY (*)    Glucose, UA 50 (*)    Hgb urine dipstick LARGE (*)    Protein, ur 100 (*)    Leukocytes,Ua LARGE (*)    RBC / HPF >50 (*)    WBC, UA >50 (*)    Bacteria, UA FEW (*)    Non Squamous Epithelial 0-5 (*)    All other components within normal limits  LACTATE DEHYDROGENASE, PLEURAL OR PERITONEAL FLUID - Abnormal; Notable for the following components:   LD, Fluid 27 (*)    All other components within normal limits  BODY FLUID CELL COUNT WITH DIFFERENTIAL - Abnormal; Notable for the following components:   Color, Fluid COLORLESS (*)    Appearance, Fluid CLOUDY (*)    Total Nucleated Cell Count, Fluid 8,306 (*)    Neutrophil Count, Fluid 97 (*)    Monocyte-Macrophage-Serous Fluid 3 (*)    All other components within normal limits  BODY FLUID CULTURE W GRAM STAIN  URINE CULTURE  CULTURE, BLOOD (ROUTINE X 2)  CULTURE, BLOOD (ROUTINE X 2)  LIPASE, BLOOD  PROTIME-INR  GLUCOSE, PLEURAL OR PERITONEAL FLUID  PROTEIN, PLEURAL OR PERITONEAL FLUID  ALBUMIN, PLEURAL OR PERITONEAL FLUID   PATHOLOGIST SMEAR REVIEW  LACTIC ACID, PLASMA  LACTIC ACID, PLASMA  CBC WITH DIFFERENTIAL/PLATELET  COMPREHENSIVE METABOLIC PANEL  MAGNESIUM  PHOSPHORUS    EKG None  Radiology DG Chest Portable 1 View  Result Date: 12/14/2021 CLINICAL DATA:  ams, abdominal pain EXAM: PORTABLE CHEST 1 VIEW COMPARISON:  Chest x-ray 11/19/2021 FINDINGS: Cardiomegaly. The heart and mediastinal contours are unchanged. Aortic calcification. Low lung volumes. Slightly  improved aeration of the lower lungs with persistent retrocardiac and right middle lobe airspace opacity. No pulmonary  edema. No pleural effusion. No pneumothorax. No acute osseous abnormality. IMPRESSION: Low lung volumes with slightly improved aeration of the lower lungs with persistent retrocardiac and right middle lobe airspace opacity. Followup PA and lateral chest X-ray is recommended in 3-4 weeks following therapy to ensure resolution and exclude underlying malignancy. Electronically Signed   By: Iven Finn M.D.   On: 12/14/2021 00:00    Procedures .Critical Care  Performed by: Emeline Darling, PA-C Authorized by: Emeline Darling, PA-C   Critical care provider statement:    Critical care time (minutes):  45   Critical care was time spent personally by me on the following activities:  Development of treatment plan with patient or surrogate, discussions with consultants, evaluation of patient's response to treatment, examination of patient, obtaining history from patient or surrogate, ordering and performing treatments and interventions, ordering and review of laboratory studies, ordering and review of radiographic studies, pulse oximetry and re-evaluation of patient's condition    Medications Ordered in ED Medications  ceFEPIme (MAXIPIME) 2 g in sodium chloride 0.9 % 100 mL IVPB (has no administration in time range)  vancomycin variable dose per unstable renal function (pharmacist dosing) (has no administration in time range)  heparin injection 5,000 Units (has no administration in time range)  prochlorperazine (COMPAZINE) injection 10 mg (has no administration in time range)  acetaminophen (TYLENOL) tablet 500 mg (has no administration in time range)  oxyCODONE (Oxy IR/ROXICODONE) immediate release tablet 5 mg (has no administration in time range)  HYDROmorphone (DILAUDID) injection 0.5 mg (has no administration in time range)  polyethylene glycol (MIRALAX / GLYCOLAX) packet 17 g (has no administration in time range)  albuterol (PROVENTIL) (2.5 MG/3ML) 0.083% nebulizer solution 2.5 mg  (has no administration in time range)  aspirin EC tablet 81 mg (has no administration in time range)  calcium acetate (Phos Binder) (PHOSLYRA) 667 MG/5ML oral solution 1,334 mg (has no administration in time range)  finasteride (PROSCAR) tablet 5 mg (has no administration in time range)  levothyroxine (SYNTHROID) tablet 175 mcg (has no administration in time range)  pantoprazole (PROTONIX) EC tablet 40 mg (has no administration in time range)  sertraline (ZOLOFT) tablet 50 mg (has no administration in time range)  simvastatin (ZOCOR) tablet 40 mg (has no administration in time range)  spironolactone (ALDACTONE) tablet 25 mg (has no administration in time range)  potassium chloride 10 mEq in 100 mL IVPB (0 mEq Intravenous Stopped 12/14/21 0510)  lactated ringers bolus 1,000 mL (0 mLs Intravenous Stopped 12/14/21 0226)  vancomycin (VANCOREADY) IVPB 2000 mg/400 mL (0 mg Intravenous Stopped 12/14/21 0509)  ceFEPIme (MAXIPIME) 2 g in sodium chloride 0.9 % 100 mL IVPB (0 g Intravenous Stopped 12/14/21 0214)    ED Course/ Medical Decision Making/ A&P Clinical Course as of 12/14/21 7673  Sat Dec 13, 2021  2342 Patient needs peritoneal fluid analysis; RN aware to call dialysis team to draw fluid off of peritoneal dialysis catheter.  [RS]  Sun Dec 14, 2021  0009 RN informed this provider that patient had a run of Vtach in the room, while undergoing phlebotomy. Unfortunately, he was not admitted on the monitor, therefore unable to print rhythm strips from leading up to episode. Subsequently vomited, now resting fine. EKG collected [RS]  0106 Consult to Jeneen Rinks, pharmacist, to discuss antibiotic choice for patient on peritoneal dialysis with concern for peritonitis.  He recommends Vanco and  cefepime . I appreciate his collaboration in the care of this patient. [RS]  0120 Consult to nephrologist, Dr. Jonnie Finner, who agrees with plan for antibiotics and admission to medicine.  Will consult in the morning and plan for  dialysis tomorrow. [RS]    Clinical Course User Index [RS] Aura Dials                           Medical Decision Making 76 year old male with history of multiple CVAs and ESRD on peritoneal dialysis who presents with concern for abdominal pain and cloudiness in his peritoneal fluid at home.  Vital signs are normal and intake though BP is on the low end of normal 102/83 and intake now improved to the 1 teens systolic.  Cardiopulmonary exam is largely unremarkable, abdominal exam with distention generalized tenderness palpation with rebound.  Peritoneal dialysis catheter present in the right lower abdomen.  Neurovascular intact in all extremities.  Per wife at patient's new baseline mental status though this has declined over the last several months and more acutely in the last week.  Concern for possible peritonitis.  Amount and/or Complexity of Data Reviewed Labs: ordered.    Details: CBC with leukocytosis of 14,000 but no anemia.  CMP with hyponatremia to 134 and critical hypokalemia to 2.7.  Baseline creatinine of 6.  Lactic acid elevated to 3.1.  Lipase is normal. Peritoneal fluid cloudy in appearance with greater than 8000 total nucleated cells with 97% neutrophils.  Peritoneal fluid as well.  Findings consistent with dialysis associated bacterial peritonitis.  Lactic improved to 2.8. Radiology: ordered.    Details: Chest x-ray with persistent retrocardiac and right middle lobe airspace opacities with recommendation for follow-up x-ray in 3 to 4 weeks to rule out malignancy and ensure resolution.  Visualized by this provider. ECG/medicine tests:     Details: EKG with aflutter, history of same on holter monitor in 3/23  Risk Prescription drug management. Decision regarding hospitalization.   Patient will require admission for management of peritonitis associated with peritoneal dialysis. Consult to hospitalist as above. Lot and his family voiced understanding of his  medical evaluation and treatment plan. Each of their questions was answered to their expressed satisfaction. He and his wife are amenable to admission at this time.   This chart was dictated using voice recognition software, Dragon. Despite the best efforts of this provider to proofread and correct errors, errors may still occur which can change documentation meaning.    Final Clinical Impression(s) / ED Diagnoses Final diagnoses:  Peritonitis associated with peritoneal dialysis, initial encounter [facility])    Rx / DC Orders ED Discharge Orders     None         Aura Dials 12/14/21 7829    Merryl Hacker, MD 12/15/21 [phone]

## 2021-12-14 NOTE — ED Notes (Signed)
Pt repositioned  c/o feeling hot  t 98.0

## 2021-12-14 NOTE — Progress Notes (Signed)
PROGRESS NOTE    [name]  BJS:[id number] DOB: 1945-10-14 DOA: 12/13/2021 PCP: Celene Squibb, MD   Chief Complaint  Patient presents with   Abdominal Pain    Brief Narrative:   This is a no charge note as patient was seen and admitted earlier today by Dr. Nevada Crane, patient was seen and examined, imaging, labs and chart were reviewed.  HPI: [name] is a 76 y.o. male with medical history significant for ESRD on peritoneal dialysis (makes urine), anemia of chronic disease, prior CVA, dysphagia on pured diet, paroxysmal A-flutter, followed by cardiology (was on Eliquis, discontinued per cardiology), coronary artery disease, aortic stenosis status post AVR with bioprosthetic valve, moderate MS and moderate TR, essential hypertension, hyperlipidemia, hypothyroidism, history of PE, chronic constipation, followed by GI, chronic anxiety/depression who presented to [facility] ED from home with complaints of abdominal pain and cloudy dialysate fluid.  Patient's family called PD nurse who recommended to present to the ED for further evaluation given concern for peritonitis.  Peritoneal fluid analysis was done in the ED.  Few gram-positive cocci seen on Gram stain with moderate WBC present, predominantly PMN.  The patient was started on IV vancomycin and cefepime empirically in the ED.  Recently treated with antibiotics for urinary tract infection that presented with hematuria, completed course of antibiotics a week ago.  Hematuria returned yesterday and has now cleared at the time of this visit.  TRH, hospitalist service was asked to admit.   ED Course: Tmax 97.9.  BP 125/88, pulse 66, respiratory 17, saturation 96% on room air.  Lab studies remarkable for serum sodium 134, potassium 2.7, serum glucose 187, BUN 40, creatinine 6.74, serum bicarb 26, anion gap 16.  AST 54.  Lactic acid 3.1, 2.8.  WBC 14.6, neutrophil count 12.9.  Assessment & Plan:   Principal Problem:   Peritonitis ([place])   Abdominal  pain, cloudy dialysate with concern for peritonitis, POA Peritoneal fluid with gram-positive cocci in on Gram stain and moderate WBC predominantly PMNs, follow cultures, body fluid and blood cultures. Leukocytosis, WBC 14.6 K Monitor fever curve and WBC Analgesics as needed Continue with IV vancomycin and cefepime, and de-escalate when more cultures available   ESRD on PD with concern for peritonitis Renal consulted Volume status and electrolytes abnormalities addressed with hemodialysis   Hypervolemic hyponatremia Serum sodium 134 Volume status addressed with hemodialysis   Hypokalemia Serum potassium 2.7 Repleted intravenously while awaiting for hemodialysis Repeat renal panel in the morning   Dysphagia Patient on pured diet at home with no restrictions per his wife, no-salt or carb-modified restrictions. Aspiration precautions in place   History of CVA Resume home regimen Fall and aspiration precautions in place   Hyperglycemia Serum glucose 187 Obtain A1c Insulin sliding scale Avoid hypoglycemia in the setting of insulin use and ESRD   Isolated elevated AST AST mildly elevated 54 Trend Repeat CMP in the morning     DVT prophylaxis: [place] heparin Code Status: Full Family Communication: D/W wife at [place] Disposition:   Status is: Inpatient    Consultants:  Renal  Subjective:  No fever, no chills, reports nausea  Objective: Vitals:   12/14/21 0600 12/14/21 0658 12/14/21 0845 12/14/21 0910  BP: 121/87  113/77 126/80  Pulse: 69  68 77  Resp: '19  20 18  '$ Temp:  98 F (36.7 C) 98.4 F (36.9 C) 97.8 F (36.6 C)  TempSrc:   Axillary Oral  SpO2: 93%  100% 95%  Weight:  102 kg  Height:    '5\' 8"'$  (1.727 m)   No intake or output data in the 24 hours ending 12/14/21 0948 Filed Weights   12/14/21 0910  Weight: 102 kg    Examination:  Awake Alert, ill-appearing, in no apparent distress Symmetrical Chest wall movement, Good air movement bilaterally,  CTAB RRR,No Gallops,Rubs or new Murmurs, No Parasternal Heave +ve B.Sounds, Abd Soft, No tenderness, No rebound - guarding or rigidity. No Cyanosis, Clubbing or edema, No new Rash or bruise       Data Reviewed: I have personally reviewed following labs and imaging studies  CBC: Recent Labs  Lab 12/13/21 2350 12/14/21 0558  WBC 14.6* 9.9  NEUTROABS 12.9* 7.6  HGB 15.0 13.4  HCT 41.1 37.1*  MCV 95.4 95.9  PLT 198 010    Basic Metabolic Panel: Recent Labs  Lab 12/13/21 2350 12/14/21 0558  NA 134* 135  K 2.7* 3.1*  CL 92* 96*  CO2 26 25  GLUCOSE 187* 125*  BUN 40* 42*  CREATININE 6.74* 6.53*  CALCIUM 9.8 8.9  MG  --  1.6*  PHOS  --  3.1    GFR: Estimated Creatinine Clearance: 11.1 mL/min (A) (by C-G formula based on SCr of 6.53 mg/dL (H)).  Liver Function Tests: Recent Labs  Lab 12/13/21 2350 12/14/21 0558  AST 54* 56*  ALT 21 18  ALKPHOS 85 69  BILITOT 0.6 0.8  PROT 7.7 6.4*  ALBUMIN 3.3* 2.7*    CBG: No results for input(s): "GLUCAP" in the last 168 hours.   Recent Results (from the past 240 hour(s))  Body fluid culture w Gram Stain     Status: None (Preliminary result)   Collection Time: 12/14/21 12:37 AM   Specimen: Peritoneal Cavity; Peritoneal Fluid  Result Value Ref Range Status   Specimen Description PERITONEAL CAVITY  Final   Special Requests NONE  Final   Gram Stain   Final    CYTOSPIN SMEAR MODERATE WBC PRESENT, PREDOMINANTLY PMN FEW GRAM POSITIVE COCCI Performed at [facility], [address], [place], [place] [address]    Culture PENDING  Incomplete   Report Status PENDING  Incomplete         Radiology Studies: DG Chest Portable 1 View  Result Date: 12/14/2021 CLINICAL DATA:  ams, abdominal pain EXAM: PORTABLE CHEST 1 VIEW COMPARISON:  Chest x-ray 11/19/2021 FINDINGS: Cardiomegaly. The heart and mediastinal contours are unchanged. Aortic calcification. Low lung volumes. Slightly improved aeration of the lower lungs  with persistent retrocardiac and right middle lobe airspace opacity. No pulmonary edema. No pleural effusion. No pneumothorax. No acute osseous abnormality. IMPRESSION: Low lung volumes with slightly improved aeration of the lower lungs with persistent retrocardiac and right middle lobe airspace opacity. Followup PA and lateral chest X-ray is recommended in 3-4 weeks following therapy to ensure resolution and exclude underlying malignancy. Electronically Signed   By: Iven Finn M.D.   On: 12/14/2021 00:00        Scheduled Meds:  aspirin EC  81 mg Oral Daily   calcium acetate (Phos Binder)  1,334 mg Oral TID WC   finasteride  5 mg Oral Daily   heparin  5,000 Units Subcutaneous Q8H   levothyroxine  175 mcg Oral QAC breakfast   pantoprazole  40 mg Oral Daily   sertraline  50 mg Oral Daily   simvastatin  40 mg Oral QPM   spironolactone  25 mg Oral Daily   vancomycin variable dose per unstable renal  function (pharmacist dosing)   Does not apply See admin instructions   Continuous Infusions:  [START ON 12/15/2021] ceFEPime (MAXIPIME) IV       LOS: 0 days       Phillips Climes, MD [facility]   To contact the attending provider between 7A-7P or the covering provider during after hours 7P-7A, please log into the web site [email or web address] and access using universal [place] password for that web site. If you do not have the password, please call the hospital operator.  12/14/2021, 9:48 AM

## 2021-12-15 DIAGNOSIS — I639 Cerebral infarction, unspecified: Secondary | ICD-10-CM

## 2021-12-15 DIAGNOSIS — E876 Hypokalemia: Secondary | ICD-10-CM | POA: Diagnosis not present

## 2021-12-15 DIAGNOSIS — L8990 Pressure ulcer of unspecified site, unspecified stage: Secondary | ICD-10-CM

## 2021-12-15 DIAGNOSIS — K659 Peritonitis, unspecified: Secondary | ICD-10-CM | POA: Diagnosis not present

## 2021-12-15 LAB — COMPREHENSIVE METABOLIC PANEL
ALT: 16 U/L (ref 0–44)
AST: 31 U/L (ref 15–41)
Albumin: 2.2 g/dL — ABNORMAL LOW (ref 3.5–5.0)
Alkaline Phosphatase: 60 U/L (ref 38–126)
Anion gap: 14 (ref 5–15)
BUN: 43 mg/dL — ABNORMAL HIGH (ref 8–23)
CO2: 23 mmol/L (ref 22–32)
Calcium: 8.8 mg/dL — ABNORMAL LOW (ref 8.9–10.3)
Chloride: 97 mmol/L — ABNORMAL LOW (ref 98–111)
Creatinine, Ser: 6.28 mg/dL — ABNORMAL HIGH (ref 0.61–1.24)
GFR, Estimated: 9 mL/min — ABNORMAL LOW (ref >60)
Glucose, Bld: 148 mg/dL — ABNORMAL HIGH (ref 70–99)
Potassium: 2.4 mmol/L — CL (ref 3.5–5.1)
Sodium: 134 mmol/L — ABNORMAL LOW (ref 135–145)
Total Bilirubin: 0.6 mg/dL (ref 0.3–1.2)
Total Protein: 5.4 g/dL — ABNORMAL LOW (ref 6.5–8.1)

## 2021-12-15 LAB — CBC
HCT: 30 % — ABNORMAL LOW (ref 39.0–52.0)
Hemoglobin: 11.1 g/dL — ABNORMAL LOW (ref 13.0–17.0)
MCH: 34.9 pg — ABNORMAL HIGH (ref 26.0–34.0)
MCHC: 37 g/dL — ABNORMAL HIGH (ref 30.0–36.0)
MCV: 94.3 fL (ref 80.0–100.0)
Platelets: 139 10*3/uL — ABNORMAL LOW (ref 150–400)
RBC: 3.18 MIL/uL — ABNORMAL LOW (ref 4.22–5.81)
RDW: 13.4 % (ref 11.5–15.5)
WBC: 8.2 10*3/uL (ref 4.0–10.5)
nRBC: 0 % (ref 0.0–0.2)

## 2021-12-15 LAB — HEMOGLOBIN A1C
Hgb A1c MFr Bld: 5.5 % (ref 4.8–5.6)
Mean Plasma Glucose: 111.15 mg/dL

## 2021-12-15 LAB — POTASSIUM: Potassium: 3.1 mmol/L — ABNORMAL LOW (ref 3.5–5.1)

## 2021-12-15 MED ORDER — SODIUM CHLORIDE 0.9 % IV SOLN
1.0000 g | INTRAVENOUS | Status: DC
  Administered 2021-12-15: 1 g via INTRAVENOUS
  Filled 2021-12-15 (×3): qty 1

## 2021-12-15 MED ORDER — POTASSIUM CHLORIDE CRYS ER 20 MEQ PO TBCR
40.0000 meq | EXTENDED_RELEASE_TABLET | Freq: Once | ORAL | Status: AC
  Administered 2021-12-15: 40 meq via ORAL
  Filled 2021-12-15: qty 2

## 2021-12-15 MED ORDER — GENTAMICIN SULFATE 0.1 % EX CREA: 1.0000 | TOPICAL_CREAM | Freq: Every day | CUTANEOUS | Status: DC

## 2021-12-15 MED ORDER — VORTIOXETINE HBR 20 MG PO TABS
20.0000 mg | ORAL_TABLET | Freq: Every day | ORAL | Status: DC
  Administered 2021-12-15 – 2021-12-17 (×3): 20 mg via ORAL
  Filled 2021-12-15 (×3): qty 1

## 2021-12-15 NOTE — Evaluation (Signed)
Physical Therapy Evaluation Patient Details Name: [name] MRN: [id number] DOB: 1946-03-19 Today's Date: 12/15/2021  History of Present Illness  Patient is 76 y/o male who presented to ED from home 12/13/21 for abdominal pain and dyalysate fluid.  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 PTA pt was Mod independent using a RW for mobility within household until last few weeks when he started needing Max assist for pivot transfers. He lives with his wife in a one level house with 2 steps. Currently pt requires Mod assist for bed mobility and Max assist and use of the [place] equipment for transfers. Pt will benefit from skilled PT for above impairments. Recommending AIR after DC from hospital. Will progress as able.      Recommendations for follow up therapy are one component of a multi-disciplinary discharge planning process, led by the attending physician.  Recommendations may be updated based on patient status, additional functional criteria and insurance authorization.  Follow Up Recommendations Acute inpatient rehab (3hours/day)    Assistance Recommended at Discharge Frequent or constant Supervision/Assistance  Patient can return home with the following  A lot of help with walking and/or transfers;A lot of help with bathing/dressing/bathroom;Assistance with cooking/housework;Assistance with feeding;Direct supervision/assist for medications management;Direct supervision/assist for financial management;Assist for transportation;Help with stairs or ramp for entrance    Equipment Recommendations Other (comment) Charlaine Dalton)  Recommendations for Other Services       Functional Status Assessment Patient has  had a recent decline in their functional status and demonstrates the ability to make significant improvements in function in a reasonable and predictable amount of time.     Precautions / Restrictions Precautions Precautions: Fall Precaution Comments: ataxia bilaterally Restrictions Weight Bearing Restrictions: No      Mobility  Bed Mobility Overal bed mobility: Needs Assistance Bed Mobility: Supine to Sit     Supine to sit: Mod assist     General bed mobility comments: Pt relied on VC's for hand placement and forward trunk scooting to EOB.    Transfers Overall transfer level: Needs assistance Equipment used: Rolling walker (2 wheels) Transfers: Sit to/from Stand, Bed to chair/wheelchair/BSC Sit to Stand: Max assist   Step pivot transfers: Max assist       General transfer comment: With RW- pt requires Mod assist with completed lift. With [employer] assist. Pt completed 5 sit to stands from [place]. Transfer via Lift Equipment: Stedy  Ambulation/Gait                  Stairs            Wheelchair Mobility    Modified Rankin (Stroke Patients Only)       Balance Overall balance assessment: Needs assistance Sitting-balance support: Bilateral upper extremity supported Sitting balance-Leahy Scale: Poor Sitting balance - Comments: Pt able to maintain balance at EOB when Bil UE are supporting. Postural control: Left lateral lean Standing balance support: Bilateral upper extremity supported, Reliant on assistive device for balance Standing balance-Leahy Scale: Poor Standing balance comment: Pt able to stand for 60 seconds on the Stedy, relying on Bil UE support.                             Pertinent  Vitals/Pain Pain Assessment Pain Assessment: No/denies pain    Home Living Family/patient expects to be discharged to:: Private residence Living Arrangements: Spouse/significant other Available Help at Discharge: Family Type of Home:  House Home Access: Stairs to enter Entrance Stairs-Rails: Right Entrance Stairs-Number of Steps: 2   Home Layout: One level Home Equipment: Conservation officer, nature (2 wheels);Rollator (4 wheels);Hospital bed;Tub bench;Wheelchair - manual      Prior Function Prior Level of Function : Needs assist       Physical Assist : Mobility (physical) Mobility (physical): Bed mobility;Transfers   Mobility Comments: Pt's wife reported he used a rollator months prior to admission, but has been using a RW last few months around the house, with gradual decline with needing assist to pivot over last 2-3 weeks. ADLs Comments: Last couple weeks hes been having to take sponge baths in the bed, but prior to that he was able to stand and take steps to get into the bathroom     Hand Dominance   Dominant Hand: Right    Extremity/Trunk Assessment   Upper Extremity Assessment Upper Extremity Assessment: Defer to OT evaluation RUE Deficits / Details: AROM WFLS grossly for all joints.  Strength 4/5 throughout although increased ataxia noted with finger to nose testing but less than on the left side.  Decreased FM coordination also noted with attempts to donn his gripper socks. RUE Sensation: WNL RUE Coordination: decreased fine motor;decreased gross motor LUE Deficits / Details: Pt with AROM shoulder flexion 0-100 degrees, AAROM 0-140 approximately.  Decreased finger to nose noted with pt being severely inaccurate with reaching to therapist's finger.  Elbow flexion/extension and grip 3+/5. LUE Sensation: decreased light touch (able to detect but reports difference in the hand compared to the right) LUE Coordination: decreased fine motor;decreased gross motor    Lower Extremity Assessment Lower Extremity Assessment: Difficult to assess due to impaired cognition    Cervical / Trunk Assessment Cervical / Trunk Assessment: Kyphotic  Communication   Communication: Expressive difficulties  Cognition  Arousal/Alertness: Awake/alert Behavior During Therapy: WFL for tasks assessed/performed Overall Cognitive Status: Impaired/Different from baseline Area of Impairment: Attention, Orientation, Memory, Following commands, Safety/judgement, Problem solving, Awareness                 Orientation Level: Disoriented to, Time, Situation Current Attention Level: Sustained Memory: Decreased recall of precautions, Decreased short-term memory Following Commands: Follows multi-step commands with increased time, Follows multi-step commands inconsistently, Follows one step commands consistently Safety/Judgement: Decreased awareness of safety, Decreased awareness of deficits Awareness: Intellectual Problem Solving: Slow processing, Decreased initiation, Difficulty sequencing, Requires tactile cues, Requires verbal cues General Comments: Pt relied on VC's spoken repeatedly throughout session for hand placement and direction of weight shift. Pt reports feeling straight when he's leaning, in sitting.        General Comments      Exercises     Assessment/Plan    PT Assessment Patient needs continued PT services  PT Problem List Decreased strength;Decreased activity tolerance;Decreased balance;Decreased mobility;Decreased coordination;Decreased cognition;Decreased knowledge of use of DME;Decreased safety awareness       PT Treatment Interventions DME instruction;Gait training;Stair training;Functional mobility training;Therapeutic activities;Therapeutic exercise;Balance training;Cognitive remediation;Patient/family education;Neuromuscular re-education    PT Goals (Current goals can be found in the Care Plan section)  Acute Rehab PT Goals Patient Stated Goal: To go home PT Goal Formulation: With patient/family Time For Goal Achievement: 12/29/21 Potential to Achieve Goals: Fair    Frequency Min 3X/week     Co-evaluation  AM-PAC PT "6 Clicks" Mobility  Outcome  Measure Help needed turning from your back to your side while in a flat bed without using bedrails?: A Lot Help needed moving from lying on your back to sitting on the side of a flat bed without using bedrails?: A Lot Help needed moving to and from a bed to a chair (including a wheelchair)?: Total Help needed standing up from a chair using your arms (e.g., wheelchair or bedside chair)?: A Lot Help needed to walk in hospital room?: Total Help needed climbing 3-5 steps with a railing? : Total 6 Click Score: 9    End of Session Equipment Utilized During Treatment: Gait belt Activity Tolerance: Patient tolerated treatment well Patient left: in chair;with call bell/phone within reach;with chair alarm set;with family/visitor present Nurse Communication: Mobility status;Need for lift equipment (Use of Stedy back to bed.) PT Visit Diagnosis: Unsteadiness on feet (R26.81);Other abnormalities of gait and mobility (R26.89);Muscle weakness (generalized) (M62.81)    Time: 9758-8325 PT Time Calculation (min) (ACUTE ONLY): 33 min   Charges:   PT Evaluation $PT Eval Moderate Complexity: 1 Mod PT Treatments $Therapeutic Activity: 8-22 mins        Margie Ege, SPT [place] 12/15/2021, 2:30 PM

## 2021-12-15 NOTE — Evaluation (Signed)
Clinical/Bedside Swallow Evaluation Patient Details  Name: [name] MRN: [id number] Date of Birth: [birth year]  Today's Date: 12/15/2021 Time: SLP Start Time (ACUTE ONLY): 7829 SLP Stop Time (ACUTE ONLY): 5621 SLP Time Calculation (min) (ACUTE ONLY): 23 min  Past Medical History:  Past Medical History:  Diagnosis Date   Aortic stenosis    Status post AVR, 25 mm Edwards pericardial Magna-Ease valve 2014   Arthritis    CAD (coronary artery disease)    Status post SVG to OM 2014   COPD (chronic obstructive pulmonary disease) ([place])    Depression    Dialysis patient ([place])    on PD 7 night weekly at home   ESRD on peritoneal dialysis [facility])    Essential hypertension    Gout    Hemorrhoid    Hypertension    Hypothyroidism    Kidney stones    Lumbar spondylolysis    Mixed hyperlipidemia    Orthostatic hypotension    Osteoarthritis    Pulmonary emboli ([place]) 08/21/2020   Syncope    Vitamin D deficiency    Past Surgical History:  Past Surgical History:  Procedure Laterality Date   ANGIOPASTY     AORTIC VALVE REPLACEMENT  07/11/2012   Procedure: AORTIC VALVE REPLACEMENT (AVR);  Surgeon: Gaye Pollack, MD;  Location: [facility];  Service: Open Heart Surgery;  Laterality: N/A;   AV FISTULA PLACEMENT Left 05/30/2020   Procedure: ARTERIOVENOUS (AV) FISTULA CREATION LEFT;  Surgeon: [name], MD;  Location: [facility];  Service: Vascular;  Laterality: Left;   CARDIAC CATHETERIZATION     CAROTID ENDARTERECTOMY Left    CATARACT EXTRACTION W/PHACO Right 06/09/2021   Procedure: CATARACT EXTRACTION RIGHT EYE W/ PHACO AND INTRAOCULAR LENS PLACEMENT ([place]);  Surgeon: Baruch Goldmann, MD;  Location: [facility];  Service: Ophthalmology;  Laterality: Right;  CDE: 12.97   CATARACT EXTRACTION W/PHACO Left 06/27/2021   Procedure: CATARACT EXTRACTION PHACO AND INTRAOCULAR LENS PLACEMENT ([place]) WITH IACCESS GONIOTOMY;  Surgeon: Baruch Goldmann, MD;  Location: [facility];  Service: Ophthalmology;  Laterality: Left;   CDE: 10.08   CIRCUMCISION     COLONOSCOPY WITH PROPOFOL N/A 07/16/2020   Procedure: COLONOSCOPY WITH PROPOFOL;  Surgeon: Doran Stabler, MD;  Location: [place];  Service: Gastroenterology;  Laterality: N/A;   CORONARY ARTERY BYPASS GRAFT  07/11/2012   Procedure: CORONARY ARTERY BYPASS GRAFTING (CABG);  Surgeon: Gaye Pollack, MD;  Location: [place];  Service: Open Heart Surgery;  Laterality: N/A;  CABG x one,  using right leg greater saphenous vein harvested endoscopically   ESOPHAGOGASTRODUODENOSCOPY (EGD) WITH PROPOFOL N/A 07/15/2020   Procedure: ESOPHAGOGASTRODUODENOSCOPY (EGD) WITH PROPOFOL;  Surgeon: Doran Stabler, MD;  Location: [facility];  Service: Gastroenterology;  Laterality: N/A;   HOT HEMOSTASIS N/A 07/16/2020   Procedure: HOT HEMOSTASIS (ARGON PLASMA COAGULATION/BICAP);  Surgeon: Doran Stabler, MD;  Location: [facility];  Service: Gastroenterology;  Laterality: N/A;   INSERTION OF ANTERIOR SEGMENT AQUEOUS DRAINAGE DEVICE (ISTENT) Right 06/09/2021   Procedure: INSERTION OF RIGHT EYE ANTERIOR SEGMENT AQUEOUS DRAINAGE DEVICE (ISTENT);  Surgeon: Baruch Goldmann, MD;  Location: [facility];  Service: Ophthalmology;  Laterality: Right;  CDE: 12.97   INTRAOPERATIVE TRANSESOPHAGEAL ECHOCARDIOGRAM  07/11/2012   Procedure: INTRAOPERATIVE TRANSESOPHAGEAL ECHOCARDIOGRAM;  Surgeon: Gaye Pollack, MD;  Location: [facility];  Service: Open Heart Surgery;  Laterality: N/A;   IR PERC TUN PERIT CATH WO PORT S&I /IMAG  06/13/2020   IR REMOVAL TUN CV CATH W/O FL  07/26/2020  IR US GUIDE VASC ACCESS RIGHT  06/13/2020   JOINT REPLACEMENT Bilateral    knees   TEE WITHOUT CARDIOVERSION N/A 06/12/2020   Procedure: TRANSESOPHAGEAL ECHOCARDIOGRAM (TEE);  Surgeon: Skeet Latch, MD;  Location: [facility] ENDOSCOPY;  Service: Cardiovascular;  Laterality: N/A;   HPI:  Pt is a 76 year old admitted with possible peritoneal infection (on peritoneal dialysis). Pt with a history of dysphagia since  10/2021. SLP reconsulted. Pt had MBS that showed mild oral dysphagia with premature spillage at times causing penetration. Pt instructed to use chin tuck.    Assessment / Plan / Recommendation  Clinical Impression  Pt demosntrates a persistent dysphagia. Caregiver at bedside reports that they have had to start pureeing his foods, but regardless he spits them out or refuses them. He reports no appetite and early satiety. Upon SLP observation he has immediate coughing with thin liquids and gagging with pudding. Pt appears to likely still have some problems with adequate timing of swallowing and oral cohesion of the bolus.  A chin tuck was helpful, but has not been functional as pt cannot implement it without heavy assistance from caregiver. Most of his nutrition has come from protein shakes. Generally he has demonstrated a failure to thrive that goes beyond his oral and pharyngeal dysphagia. Pt may benefit from further discussions about his wishes and plan of care as pt and caregiver wondered if supplemental nutrition is appropriate, though given the complexity of pts medical situation it may not be. Will proceed with MBS tomorrow to see if anything more can be offered from a therapeutic standpoint, though texture modification has not led to improvement so far. SLP Visit Diagnosis: Dysphagia, oropharyngeal phase (R13.12)    Aspiration Risk  Moderate aspiration risk    Diet Recommendation Dysphagia 1 (Puree);Thin liquid   Liquid Administration via: Cup;Straw    Other  Recommendations      Recommendations for follow up therapy are one component of a multi-disciplinary discharge planning process, led by the attending physician.  Recommendations may be updated based on patient status, additional functional criteria and insurance authorization.  Follow up Recommendations        Assistance Recommended at Discharge    Functional Status Assessment    Frequency and Duration min 2x/week  2 weeks        Prognosis        Swallow Study   General HPI: Pt is a 76 year old admitted with possible peritoneal infection (on peritoneal dialysis). Pt with a history of dysphagia since 10/2021. SLP reconsulted. Pt had MBS that showed mild oral dysphagia with premature spillage at times causing penetration. Pt instructed to use chin tuck. Type of Study: Bedside Swallow Evaluation Previous Swallow Assessment: see HPI Diet Prior to this Study: Dysphagia 1 (puree);Thin liquids Temperature Spikes Noted: No Respiratory Status: Room air History of Recent Intubation: No Behavior/Cognition: Alert;Cooperative;Pleasant mood Oral Cavity Assessment: Within Functional Limits Oral Care Completed by SLP: No Oral Cavity - Dentition: Edentulous Vision: Functional for self-feeding Self-Feeding Abilities: Able to feed self Patient Positioning: Postural control interferes with function Baseline Vocal Quality: Normal Volitional Cough: Strong Volitional Swallow: Able to elicit    Oral/Motor/Sensory Function Overall Oral Motor/Sensory Function: Within functional limits   Ice Chips     Thin Liquid Thin Liquid: Impaired Presentation: Cup;Straw Pharyngeal  Phase Impairments: Cough - Immediate    Nectar Thick Nectar Thick Liquid: Not tested   Honey Thick Honey Thick Liquid: Not tested   Puree Puree: Impaired Presentation: Spoon Pharyngeal Phase  Impairments: Cough - Immediate (gag)   Solid     Solid: Not tested      Lynann Beaver 12/15/2021,3:06 PM

## 2021-12-15 NOTE — Progress Notes (Signed)
[facility] Progress Note  Name: [name] MRN: [id number] DOB: Oct 31, 1945  Chief Complaint:  Cloudy PD fluid   Subjective:   patient got potassium overnight 40 meq PO once for hypokalemia.  Per charting had 504 mL net UF with PD overnight ending 6/19 am.  Spoke with his outpatient PD RN - he has had strokes which neurology saw him for last month.  SW has discussed end of life with patient but they do not wish to stop PD yet but he's declining.  eating very little.  Fill volumes estimated at 2.4 liter per RN (per her recall) and last bag fill of icodextran 2 liters. 5 cycles over 9 hours. No hx of peritonitis.  His wife has continued to work and his neighbor has been unhooking it.  Review of systems:  Denies shortness of breath or chest pain  Limited given AMS  ---------- Background on consult:   Reason for Consult: ESRD pt on PD w/ cloudy fluid HPI: The patient is a 76 y.o. year-old w/ hx of AS sp AVR, DJD, CAD sp CABGx1, COPD, depression, ESRD on PD, HTN, gout, HL, hx PE who developed cloudy PD fluid which was noted yesterday. Their PD nurse instructed them to go to ED. Also has hx of CVA and some speech and swallowing difficulties. Pt had been afebrile. Also hx of atrial flutter. In ED PD fluid was sampled and sent to lab and patient received IV abx.  PD cell count was 8300 and gram stain showed wbc's and GPC. Pt was admitted. We are asked to see for CCPD.     Pt seen in room.  Family states he hasn't been "himself" for about 6- 8 wks, they saw a neurologist in OP setting I believe, and they say that he "had a stroke" but they say that they didn't understand why he hasn't eaten for 8-10 days, and they don't seem comfortable with his overall neuro status. Patient has not been confused. Speech is hard to discern at times. Pt states he is having significant problems swallowing food. Family notes that "he is taking his pills though". They live in [place]. On PD for about 1  year, no HD access placed yet. Pt answers questions relatively appropriately.     Intake/Output Summary (Last 24 hours) at 12/15/2021 0826 Last data filed at 12/15/2021 0010 Gross per 24 hour  Intake 170 ml  Output 50 ml  Net 120 ml    Vitals:  Vitals:   12/14/21 1959 12/14/21 2000 12/15/21 0010 12/15/21 0423  BP:  104/64 (!) 119/59 122/65  Pulse: 75 66    Resp: '15 16 14 14  '$ Temp: 98.2 F (36.8 C)  98 F (36.7 C) 98.1 F (36.7 C)  TempSrc: Oral  Oral Oral  SpO2:    95%  Weight:    102.9 kg  Height:         Physical Exam:  General elderly gentleman in bed in no acute distress HEENT normocephalic atraumatic extraocular movements intact sclera anicteric Neck supple trachea midline Lungs clear to auscultation bilaterally normal work of breathing at rest on room air Heart S1S2 no rub Abdomen soft nontender nondistended Extremities no edema  Psych calm mood and affect Neuro - confused.  not oriented to time or location - the year is 1973; able to provide name and birthdate. Thinks we are at [facility]  PD catheter in place   Medications reviewed   Labs:     Latest Ref Rng &  Units 12/15/2021    4:10 AM 12/14/2021    5:58 AM 12/13/2021   11:50 PM  BMP  Glucose 70 - 99 mg/dL 148  125  187   BUN 8 - 23 mg/dL 43  42  40   Creatinine 0.61 - 1.24 mg/dL 6.28  6.53  6.74   Sodium 135 - 145 mmol/L 134  135  134   Potassium 3.5 - 5.1 mmol/L 2.4  C 3.1  2.7   Chloride 98 - 111 mmol/L 97  96  92   CO2 22 - 32 mmol/L '23  25  26   '$ Calcium 8.9 - 10.3 mg/dL 8.8  8.9  9.8     C Corrected result   Outpatient PD orders:  Davita Putnam 2.4 liter per RN (per her recall) and last bag fill of icodextran 2 liters. 5 cycles over 9 hours. Outpatient PD RN Malachy Mood [phone].     Assessment/Plan:   PD catheter- related peritonitis - TNC from 6/17 sample was 8306 with 97% pmn's and gram stain showed GPC.  lower abd TTP. Getting IV abx now w/ gram + and gram neg coverage.   Follow-up PD  fluid culture - pending IV antibiotics here and upon dc he will get intraperitoneal antibiotics through his renal provider Update PD fluid cell count   Watch mental status with elderly gentleman and cefepime - we are switching him to ceftazidime 1 gram daily out of an abundance of caution as he is already confused.  Appreciate pharmacy Anticipate 2 weeks of therapy ESRD - on CCPD x 1 year.  Will contact outpatient unit for additional records.  Pt is from [place].  5 exchanges with 2 L dwells here - continue 1.5% dextrose with report of markedly reduced intake .  Hypokalemia - repleted this am.  Repeat K and see additional K is ordered - ok to give  Slurred speech/ dysphagia - hx of recent CVA per family, possibly was picked up in the OP setting. Not sure details. They are saying he is "not the same person" that he was before whatever event happened, this is over the last 1-2 mos.  Hypothyroidism - per pmd Metabolic bone disease ckd - Ca and phos in range.  Will pause phoslo for now as not really eating well.  Anemia esrd - no esa needs at this time.  Disposition - await PD fluid culture.  Per outpatient PD RN he has had considerable decline over the past few months. Would benefit from continued goals of care discussions  Claudia Desanctis, MD 12/15/2021 9:02 AM

## 2021-12-15 NOTE — Consult Note (Signed)
WOC Nurse Consult Note: Reason for Consult:Stage 2 pressure injury to right buttock, POA Wound type:Pressure Pressure Injury POA: Yes Measurement:2cm x 1cm x 0.1cm Wound bed:red. moist Drainage (amount, consistency, odor) scant serous Periwound:intact Dressing procedure/placement/frequency: Turning and repositioning is in place, guidance is provided to minimize time in the supine position. Patient is provided with pressure redistribution heel boots (Prevalon). Topical care to the area will be with NS cleanse followed by covering with an antimicrobial nonadherent, xeroform, topped with dry gauze and covered with the house silicone foam dressing,. Xeroform is to be changed daily, the silicone foam may be reused for up to 3 days; change PRN soiling.  [facility] nursing team will not follow, but will remain available to this patient, the nursing and medical teams.  Please re-consult if needed.  Thank you for inviting [place] to participate in this patient's Plan of Care.  Maudie Flakes, MSN, RN, CNS, [place], Serita Grammes, [employer], [employer] phone:  [phone]

## 2021-12-15 NOTE — Progress Notes (Signed)
TRH night cross cover note:   I was notified by RN that this morning's labs reflect serum potassium level of 2.4.  This is compared to potassium level of 3.1 yesterday morning.  It appears that patient is on continuous peritoneal dialysis.  For now, I have ordered potassium chloride 40 meq p.o. x1 dose now.     Babs Bertin, DO Hospitalist

## 2021-12-15 NOTE — TOC Initial Note (Signed)
Transition of Care [facility]) - Initial/Assessment Note    Patient Details  Name: [name] MRN: [id number] Date of Birth: Aug 15, 1945  Transition of Care [facility]) CM/SW Contact:    Zenon Mayo, RN Phone Number: 12/15/2021, 4:07 PM  Clinical Narrative:                 He is from home with wife, he is total care, wife is caregiver, he is active with [facility] for HHPT, ,[place],  will need to add HHST to services.  Wife states she would like to continue with [facility],  NCM made notified Caryl Pina .  Awaiting to hear back from [place].  Per wife he has a Research scientist (medical), Interior and spatial designer, w/chair , tub transfer chair, bsc, and a hospital bed at home. He will need ambulance transport at dc.  TOC will continue to follow for dc needs. Caryl Pina is able to take referral.   Expected Discharge Plan: [place] Barriers to Discharge: Continued Medical Work up   Patient Goals and CMS Choice Patient states their goals for this hospitalization and ongoing recovery are:: return home with wife CMS Medicare.gov Compare Post Acute Care list provided to:: Patient Represenative (must comment) Choice offered to / list presented to : Spouse  Expected Discharge Plan and Services Expected Discharge Plan: [place] In-house Referral: NA Discharge Planning Services: CM Consult Post Acute Care Choice: [place] arrangements for the past 2 months: Single Family Home                   DME Agency: NA       HH Arranged: PT, OT, Speech Therapy HH Agency: [place] [facility]) Date HH Agency Contacted: 12/15/21 Time [place]: [place] Representative spoke with at [place]: Caryl Pina  Prior Living Arrangements/Services Living arrangements for the past 2 months: [place] with:: Spouse   Do you feel safe going back to the place where you live?: Yes      Need for Family Participation in Patient Care: Yes (Comment) Care giver support system in place?: Yes  (comment) Current home services: Home PT, Home OT (active with [facility] for HHPT, HHOT)    Activities of Daily Living      Permission Sought/Granted                  Emotional Assessment   Attitude/Demeanor/Rapport: Engaged Affect (typically observed): Accepting Orientation: : Oriented to Self Alcohol / Substance Use: Not Applicable Psych Involvement: No (comment)  Admission diagnosis:  Peritonitis ([place]) [K65.9] Peritonitis associated with peritoneal dialysis, initial encounter [facility]) [T85.71XA] Patient Active Problem List   Diagnosis Date Noted   Peritonitis ([place]) 12/14/2021   Pressure injury of skin 12/14/2021   Vitamin B12 deficiency (non anemic) 10/06/2021   H/O aortic valve replacement with porcine valve 09/18/2021   Generalized anxiety disorder 11/14/2020   Pulmonary emboli ([place]) 08/21/2020   CHF (congestive heart failure) ([place]) 07/26/2020   ESRD (end stage renal disease) on dialysis ([place]) 07/26/2020   History of thyroid disease 07/26/2020   Acute GI bleeding 07/14/2020   Symptomatic anemia    Hypokalemia 07/10/2020   Endocarditis    Elevated d-dimer    Acute on chronic congestive heart failure (HCC)    Demand ischemia (HCC)    Severe sepsis without septic shock (CODE) (HCC)    Enterococcal bacteremia    S/P AVR (aortic valve replacement)    Fever 06/08/2020   MGUS (monoclonal gammopathy of  unknown significance) 02/05/2020   Essential (primary) hypertension    Chronic obstructive pulmonary disease, unspecified (HCC)    Hyperlipidemia, unspecified    Hypothyroidism, unspecified    Atherosclerotic heart disease of native coronary artery without angina pectoris    Gout, unspecified    Major depressive disorder, single episode, unspecified    Morbid (severe) obesity due to excess calories (HCC)    Nonrheumatic aortic (valve) stenosis    Orthostatic hypotension    Hypertension    Thyroid disease    Arthritis    COPD (chronic obstructive pulmonary disease)  (HCC)    Heart murmur    Aortic stenosis    CAD (coronary artery disease)    Syncope    Depression    Kidney stones    History of myocardial infarction 10/12/2011   Personal history of cardiac murmur 10/12/2011   Snoring 10/12/2011   Balanitis 09/24/2011   PCP:  Celene Squibb, MD Pharmacy:   [facility] [id number], [place] [address] Phone: [phone] Fax: [phone]     Social Determinants of Health (SDOH) Interventions    Readmission Risk Interventions    12/15/2021    4:03 PM  Readmission Risk Prevention Plan  Transportation Screening Complete  PCP or Specialist Appt within 3-5 Days Complete  HRI or Home Care Consult Complete  Social Work Consult for [place] Planning/Counseling Complete  Palliative Care Screening Not Applicable  Medication Review Press photographer) Complete

## 2021-12-15 NOTE — Progress Notes (Addendum)
PROGRESS NOTE    [name]  XIH:[id number] DOB: 1945-09-14 DOA: 12/13/2021 PCP: Celene Squibb, MD   Chief Complaint  Patient presents with   Abdominal Pain    Brief Narrative:   [name] is a 76 y.o. male with medical history significant for ESRD on peritoneal dialysis (makes urine), anemia of chronic disease, prior CVA, dysphagia on pured diet, paroxysmal A-flutter, followed by cardiology (was on Eliquis, discontinued per cardiology), coronary artery disease, aortic stenosis status post AVR with bioprosthetic valve, moderate MS and moderate TR, essential hypertension, hyperlipidemia, hypothyroidism, history of PE, chronic constipation, followed by GI, chronic anxiety/depression who presented to [facility] ED from home with complaints of abdominal pain and cloudy dialysate fluid.  Patient's family called PD nurse who recommended to present to the ED for further evaluation given concern for peritonitis.  Peritoneal fluid analysis was done in the ED.  Few gram-positive cocci seen on Gram stain with moderate WBC present, predominantly PMN.  The patient was started on IV vancomycin and cefepime empirically in the ED.  Recently treated with antibiotics for urinary tract infection that presented with hematuria, completed course of antibiotics a week ago.  Hematuria returned yesterday and has now cleared at the time of this visit.  TRH, hospitalist service was asked to admit.   Assessment & Plan:   Principal Problem:   Peritonitis ([place]) Active Problems:   Pressure injury of skin   Abdominal pain, cloudy dialysate with concern for peritonitis, POA - Peritoneal fluid with gram-positive cocci in on Gram stain and moderate WBC predominantly PMNs, follow cultures, body fluid and blood cultures.  We can narrow antibiotics when more data available -Leukocytosis trending down which is reassuring -Continue with IV antibiotics during hospital stay, he can be transitioned to intraperitoneal antibiotics   -IV cefepime transition to ceftazidime to avoid confusion in elderly patient. -Will need total of 2 weeks therapy.    ESRD on PD with concern for peritonitis Renal consulted On peritoneal dialysis during hospital stay  UTI -Still making small amount of urine, has suprapubic tenderness, he is on broad-spectrum antibiotic   Hypervolemic hyponatremia Mild, management with dialysis  Hypokalemia Low at 2.4, repleted  Dysphagia -Apparently over last couple months after recent CVA, will consult SLP   History of CVA Diagnosed 2 months ago -Continue with aspirin and statin -We will consult PT/OT    Hyperglycemia -No recurrence -Check A1c.  Isolated elevated AST AST mildly elevated 54 Trend Repeat CMP in the morning   Pressure ulcer Pressure Injury 12/14/21 Buttocks Right Stage 2 -  Partial thickness loss of dermis presenting as a shallow open injury with a red, pink wound bed without slough. (Active)  12/14/21 0937  Location: Buttocks  Location Orientation: Right  Staging: Stage 2 -  Partial thickness loss of dermis presenting as a shallow open injury with a red, pink wound bed without slough.  Wound Description (Comments):   Present on Admission: Yes  Wound care consulted   DVT prophylaxis: Notus heparin Code Status: Full Family Communication: D/W wife at [place] Disposition:   Status is: Inpatient    Consultants:  Renal  Subjective:  No fever, no chills, nausea or vomiting today  Objective: Vitals:   12/14/21 2000 12/15/21 0010 12/15/21 0423 12/15/21 0844  BP: 104/64 (!) 119/59 122/65 121/78  Pulse: 66   65  Resp: '16 14 14 18  '$ Temp:  98 F (36.7 C) 98.1 F (36.7 C) 98 F (36.7 C)  TempSrc:  Oral Oral Oral  SpO2:   95% 100%  Weight:   102.9 kg   Height:        Intake/Output Summary (Last 24 hours) at 12/15/2021 1044 Last data filed at 12/15/2021 0010 Gross per 24 hour  Intake 170 ml  Output 50 ml  Net 120 ml   Filed Weights   12/14/21 0910  12/15/21 0423  Weight: 102 kg 102.9 kg    Examination:  Awake Alert, ill-appearing, in no apparent distress, dysphagia Symmetrical Chest wall movement, Good air movement bilaterally, CTAB RRR,No Gallops,Rubs or new Murmurs, No Parasternal Heave +ve B.Sounds, Abd Soft, No tenderness, No rebound - guarding or rigidity. No Cyanosis, Clubbing or edema, No new Rash or bruise       Data Reviewed: I have personally reviewed following labs and imaging studies  CBC: Recent Labs  Lab 12/13/21 2350 12/14/21 0558 12/15/21 0410  WBC 14.6* 9.9 8.2  NEUTROABS 12.9* 7.6  --   HGB 15.0 13.4 11.1*  HCT 41.1 37.1* 30.0*  MCV 95.4 95.9 94.3  PLT 198 173 139*    Basic Metabolic Panel: Recent Labs  Lab 12/13/21 2350 12/14/21 0558 12/15/21 0410 12/15/21 0840  NA 134* 135 134*  --   K 2.7* 3.1* 2.4* 3.1*  CL 92* 96* 97*  --   CO2 '26 25 23  '$ --   GLUCOSE 187* 125* 148*  --   BUN 40* 42* 43*  --   CREATININE 6.74* 6.53* 6.28*  --   CALCIUM 9.8 8.9 8.8*  --   MG  --  1.6*  --   --   PHOS  --  3.1  --   --     GFR: Estimated Creatinine Clearance: 11.6 mL/min (A) (by C-G formula based on SCr of 6.28 mg/dL (H)).  Liver Function Tests: Recent Labs  Lab 12/13/21 2350 12/14/21 0558 12/15/21 0410  AST 54* 56* 31  ALT '21 18 16  '$ ALKPHOS 85 69 60  BILITOT 0.6 0.8 0.6  PROT 7.7 6.4* 5.4*  ALBUMIN 3.3* 2.7* 2.2*    CBG: No results for input(s): "GLUCAP" in the last 168 hours.   Recent Results (from the past 240 hour(s))  Blood culture (routine x 2)     Status: None (Preliminary result)   Collection Time: 12/13/21 11:50 PM   Specimen: BLOOD  Result Value Ref Range Status   Specimen Description BLOOD RIGHT ARM  Final   Special Requests   Final    BOTTLES DRAWN AEROBIC AND ANAEROBIC Blood Culture adequate volume   Culture   Final    NO GROWTH 1 DAY Performed at [facility], [address] [place], [place] [address]    Report Status PENDING  Incomplete  Blood culture  (routine x 2)     Status: None (Preliminary result)   Collection Time: 12/14/21 12:15 AM   Specimen: BLOOD RIGHT HAND  Result Value Ref Range Status   Specimen Description BLOOD RIGHT HAND  Final   Special Requests   Final    BOTTLES DRAWN AEROBIC AND ANAEROBIC Blood Culture adequate volume   Culture   Final    NO GROWTH 1 DAY Performed at [facility], [place] [address], [place], [place] [address]    Report Status PENDING  Incomplete  Body fluid culture w Gram Stain     Status: None (Preliminary result)   Collection Time: 12/14/21 12:37 AM   Specimen: Peritoneal Cavity; Peritoneal Fluid  Result Value Ref Range Status   Specimen Description  PERITONEAL CAVITY  Final   Special Requests NONE  Final   Gram Stain   Final    CYTOSPIN SMEAR MODERATE WBC PRESENT, PREDOMINANTLY PMN FEW GRAM POSITIVE COCCI Performed at [facility], [place] [address] [place], [place] [address]    Culture PENDING  Incomplete   Report Status PENDING  Incomplete  Urine Culture     Status: Abnormal (Preliminary result)   Collection Time: 12/14/21  4:33 AM   Specimen: In/Out Cath Urine  Result Value Ref Range Status   Specimen Description IN/OUT CATH URINE  Final   Special Requests NONE  Final   Culture (A)  Final    30,000 COLONIES/mL GRAM NEGATIVE RODS SUSCEPTIBILITIES TO FOLLOW Performed at [facility], [place] [address] [place],  [address]    Report Status PENDING  Incomplete         Radiology Studies: DG Chest Portable 1 View  Result Date: 12/14/2021 CLINICAL DATA:  ams, abdominal pain EXAM: PORTABLE CHEST 1 VIEW COMPARISON:  Chest x-ray 11/19/2021 FINDINGS: Cardiomegaly. The heart and mediastinal contours are unchanged. Aortic calcification. Low lung volumes. Slightly improved aeration of the lower lungs with persistent retrocardiac and right middle lobe airspace opacity. No pulmonary edema. No pleural effusion. No pneumothorax. No acute osseous abnormality. IMPRESSION: Low  lung volumes with slightly improved aeration of the lower lungs with persistent retrocardiac and right middle lobe airspace opacity. Followup PA and lateral chest X-ray is recommended in 3-4 weeks following therapy to ensure resolution and exclude underlying malignancy. Electronically Signed   By: Iven Finn M.D.   On: 12/14/2021 00:00        Scheduled Meds:  aspirin EC  81 mg Oral Daily   finasteride  5 mg Oral Daily   gentamicin cream  1 Application Topical Daily   heparin  5,000 Units Subcutaneous Q8H   levothyroxine  175 mcg Oral QAC breakfast   pantoprazole  40 mg Oral Daily   sertraline  50 mg Oral Daily   simvastatin  40 mg Oral QPM   spironolactone  25 mg Oral Daily   vancomycin variable dose per unstable renal function (pharmacist dosing)   Does not apply See admin instructions   Continuous Infusions:  cefTAZidime (FORTAZ)  IV     dialysis solution 1.5% low-MG/low-CA       LOS: 1 day       Phillips Climes, MD [facility]   To contact the attending provider between 7A-7P or the covering provider during after hours 7P-7A, please log into the web site [email or web address] and access using universal [place] password for that web site. If you do not have the password, please call the hospital operator.  12/15/2021, 10:44 AM

## 2021-12-15 NOTE — Evaluation (Signed)
Occupational Therapy Evaluation Patient Details Name: [name] MRN: [id number] DOB: October 13, 1945 Today's Date: 12/15/2021   History of Present Illness Patient is 76 y/o male who presented to ED from home 12/13/21 for abdominal pain and dyalysate fluid.  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  Currently, he needs max assist +2 for sit to stand and standing with the RW during toilieting tasks as well as for simulated toilet transfers.  Dynamic sitting balance is mod to max depending on fatigue level with max +2 for standing secondary to right and left lean as well as moderate posterior lean.  Pt also presents with ataxic movement when attempting to stand and advance his LEs.  Feel he will benefit from acute care OT but will need extensive AIR level rehab prior to transfer home with his spouse.        Recommendations for follow up therapy are one component of a multi-disciplinary discharge planning process, led by the attending physician.  Recommendations may be updated based on patient status, additional functional criteria and insurance authorization.   Follow Up Recommendations  Acute inpatient rehab (3hours/day)    Assistance Recommended at Discharge Frequent or constant Supervision/Assistance  Patient can return home with the following Two people to help with walking and/or transfers;Two people to help with bathing/dressing/bathroom;Help with stairs or ramp for entrance;Direct supervision/assist for medications management;Assistance with feeding;Assist for transportation;Assistance with cooking/housework;Direct supervision/assist for financial management    Functional Status Assessment  Patient has had a recent decline in  their functional status and demonstrates the ability to make significant improvements in function in a reasonable and predictable amount of time.  Equipment Recommendations  None recommended by OT    Recommendations for Other Services       Precautions / Restrictions Precautions Precautions: Fall Precaution Comments: ataxia bilaterally Restrictions Weight Bearing Restrictions: No      Mobility Bed Mobility Overal bed mobility: Needs Assistance Bed Mobility: Supine to Sit, Sit to Supine     Supine to sit: Mod assist Sit to supine: Min assist   General bed mobility comments: Mod assist for transition to sitting and for scooting out to the EOB.  Min assist for lifting and helping to uncross legs once laying back down in the bed.    Transfers Overall transfer level: Needs assistance Equipment used: Rolling walker (2 wheels) Transfers: Sit to/from Stand, Bed to chair/wheelchair/BSC Sit to Stand: Max assist, +2 physical assistance     Step pivot transfers: Max assist, +2 physical assistance     General transfer comment: Decreased ability to efficiently move his LEs for stepping without assist to weightshift and at times assist to initiate movement of the LE.      Balance Overall balance assessment: Needs assistance Sitting-balance support: Bilateral upper extremity supported Sitting balance-Leahy Scale: Poor Sitting balance - Comments: Posterior loss of balance in sitting   Standing balance support: Reliant on assistive device for balance Standing balance-Leahy Scale: Zero Standing balance comment: Pt needs max +2 for standing.                           ADL either performed or assessed with clinical judgement   ADL Overall ADL's : Needs assistance/impaired Eating/Feeding: Minimal assistance;Sitting  Grooming: Wash/dry hands;Wash/dry face;Sitting;Moderate assistance Grooming Details (indicate cue type and reason): unsupported EOB             Lower  Body Dressing: Maximal assistance;+2 for physical assistance;Sit to/from stand   Toilet Transfer: +2 for physical assistance;Rolling walker (2 wheels);Stand-pivot;BSC/3in1;Maximal assistance   Toileting- Clothing Manipulation and Hygiene: Maximal assistance;Sit to/from stand;+2 for physical assistance       Functional mobility during ADLs: Maximal assistance;+2 for physical assistance;Rolling walker (2 wheels) (to take 2 steps up toward the top of the bed) General ADL Comments: Pt with increased ataxia with movement.  Mod assist for static sitting balance EOB with posterior pelvic tilt and lean to both the right and left.  Max +2 for standing during toileting tasks secondary to bowel incontinence.  Decreased ability to advance both LEs with ataxic movement also noted and increased fatigue as he was only able to tolerate standing for 1 minute intervals secondary to fatigue.     Vision Ability to See in Adequate Light: 0 Adequate Patient Visual Report: No change from baseline Vision Assessment?: Vision impaired- to be further tested in functional context     Perception Perception Perception: Impaired Spatial Orientation: Pt holding onto the foot of the bed and the bed rail secondary to spatial deficits   Praxis Praxis Praxis: Impaired Praxis Impairment Details: Motor planning Praxis-Other Comments: increased ataxia    Pertinent Vitals/Pain Pain Assessment Pain Assessment: No/denies pain     Hand Dominance Right   Extremity/Trunk Assessment Upper Extremity Assessment Upper Extremity Assessment: RUE deficits/detail;LUE deficits/detail RUE Deficits / Details: AROM WFLS grossly for all joints.  Strength 4/5 throughout although increased ataxia noted with finger to nose testing but less than on the left side.  Decreased FM coordination also noted with attempts to donn his gripper socks. RUE Sensation: WNL RUE Coordination: decreased fine motor;decreased gross motor LUE Deficits /  Details: Pt with AROM shoulder flexion 0-100 degrees, AAROM 0-140 approximately.  Decreased finger to nose noted with pt being severely inaccurate with reaching to therapist's finger.  Elbow flexion/extension and grip 3+/5. LUE Sensation: decreased light touch (able to detect but reports difference in the hand compared to the right) LUE Coordination: decreased fine motor;decreased gross motor   Lower Extremity Assessment Lower Extremity Assessment: Defer to PT evaluation   Cervical / Trunk Assessment Cervical / Trunk Assessment: Normal   Communication Communication Communication: Expressive difficulties   Cognition Arousal/Alertness: Awake/alert Behavior During Therapy: WFL for tasks assessed/performed Overall Cognitive Status: Impaired/Different from baseline Area of Impairment: Memory, Attention, Following commands, Safety/judgement, Awareness, Problem solving                   Current Attention Level: Sustained Memory: Decreased short-term memory Following Commands: Follows one step commands with increased time, Follows multi-step commands inconsistently Safety/Judgement: Decreased awareness of safety, Decreased awareness of deficits Awareness: Intellectual Problem Solving: Slow processing, Requires tactile cues, Requires verbal cues General Comments: Pt needing max demonstrational cueing for hand placement during sit to stand.  Pt's spouse reports hx of falls recently at home secondary to decreased awareness of safety and deficits.     General Comments       Exercises     Shoulder Instructions      Home Living Family/patient expects to be discharged to:: Private residence Living Arrangements: Spouse/significant other Available Help at Discharge: Available PRN/intermittently (spouse works but neighbor comes over and is in and out when the spouse is working) Type of Home: [employer] Access: Stairs to  enter Entrance Stairs-Number of Steps: 2 Entrance Stairs-Rails:  Right (wanting to get a ramp but haven't finished yet.  They are currently working on it though.) Home Layout: One level     Bathroom Shower/Tub: Teacher, early years/pre: [place]: BSC/3in1;Rollator (4 wheels);Rolling Walker (2 wheels);Other (comment);Hospital bed;Tub bench;Wheelchair - manual (motorized scooter)          Prior Functioning/Environment Prior Level of Function : Needs assist       Physical Assist : Mobility (physical);ADLs (physical)     Mobility Comments: Used a rollator for transfers up until a few weeks ago and then has deteriated especially since March.  Was getting home health. ADLs Comments: Last couple weeks hes been having to take sponge baths in the bed, but prior to that he was able to stand and take steps to get into the bathroom        OT Problem List: Decreased strength;Decreased range of motion;Decreased coordination;Impaired vision/perception;Decreased knowledge of use of DME or AE;Impaired UE functional use;Decreased cognition;Decreased activity tolerance;Impaired balance (sitting and/or standing);Decreased safety awareness;Impaired sensation      OT Treatment/Interventions: Self-care/ADL training;Manual therapy;Patient/family education;Therapeutic exercise;Neuromuscular education;Balance training;Therapeutic activities;DME and/or AE instruction;Cognitive remediation/compensation    OT Goals(Current goals can be found in the care plan section) Acute Rehab OT Goals Patient Stated Goal: Pt and spouse want him to get back to where he was late last year, walking and being able to travel again OT Goal Formulation: With patient/family Time For Goal Achievement: 12/29/21 Potential to Achieve Goals: Good  OT Frequency: Min 2X/week    Co-evaluation              AM-PAC OT "6 Clicks" Daily Activity     Outcome Measure Help from another person eating meals?: A Little Help from another person taking care of personal  grooming?: A Little Help from another person toileting, which includes using toliet, bedpan, or urinal?: Total Help from another person bathing (including washing, rinsing, drying)?: Total Help from another person to put on and taking off regular upper body clothing?: A Little Help from another person to put on and taking off regular lower body clothing?: Total 6 Click Score: 12   End of Session Equipment Utilized During Treatment: Gait belt;Rolling walker (2 wheels) Nurse Communication: Mobility status;Need for lift equipment  Activity Tolerance: Patient limited by fatigue Patient left: in bed;with call bell/phone within reach;with bed alarm set  OT Visit Diagnosis: Unsteadiness on feet (R26.81);Repeated falls (R29.6);Muscle weakness (generalized) (M62.81);Hemiplegia and hemiparesis;Ataxia, unspecified (R27.0);History of falling (Z91.81) Hemiplegia - Right/Left: Left Hemiplegia - dominant/non-dominant: Non-Dominant Hemiplegia - caused by: Cerebral infarction                Time: 5409-8119 OT Time Calculation (min): 56 min Charges:  OT General Charges $OT Visit: 1 Visit OT Evaluation $OT Eval Moderate Complexity: 1 Mod OT Treatments $Self Care/Home Management : 38-52 mins Markela Wee OTR/L 12/15/2021, 1:44 PM

## 2021-12-15 NOTE — Progress Notes (Signed)
RN and NT assisted patient back to bed from chair using stedy. Upon lying back to bed, patient started gagging reflexes however no vomit, but saliva came out the patient's mouth. Wife at bedside mentioned to RN that patient does this every time patient moves after eating. Patient last bite was at 1730 and sat upright in chair. RN suctioned patient's oral cavity for residue. Patient states "he feels better".

## 2021-12-15 NOTE — Progress Notes (Signed)
Inpatient Rehab Admissions Coordinator:   Per therapy rec patient was screened for CIR candidacy by Clemens Catholic, MS, CCC-SLP. At this time, Pt. Appears to be a a potential candidate for CIR. I will place   order for rehab consult per protocol for full assessment. Please contact me any with questions.  Clemens Catholic, [place], [place] Admissions Coordinator  [phone] ([place]) [phone] (office)

## 2021-12-16 ENCOUNTER — Inpatient Hospital Stay (HOSPITAL_COMMUNITY): Payer: Managed Care, Other (non HMO)

## 2021-12-16 DIAGNOSIS — I639 Cerebral infarction, unspecified: Secondary | ICD-10-CM | POA: Diagnosis not present

## 2021-12-16 DIAGNOSIS — A419 Sepsis, unspecified organism: Secondary | ICD-10-CM | POA: Diagnosis not present

## 2021-12-16 DIAGNOSIS — K659 Peritonitis, unspecified: Secondary | ICD-10-CM | POA: Diagnosis not present

## 2021-12-16 DIAGNOSIS — N390 Urinary tract infection, site not specified: Secondary | ICD-10-CM | POA: Diagnosis not present

## 2021-12-16 DIAGNOSIS — Z8673 Personal history of transient ischemic attack (TIA), and cerebral infarction without residual deficits: Secondary | ICD-10-CM

## 2021-12-16 LAB — BODY FLUID CULTURE W GRAM STAIN

## 2021-12-16 LAB — BODY FLUID CELL COUNT WITH DIFFERENTIAL
Eos, Fluid: 1 %
Lymphs, Fluid: 9 %
Monocyte-Macrophage-Serous Fluid: 18 % — ABNORMAL LOW (ref 50–90)
Neutrophil Count, Fluid: 72 % — ABNORMAL HIGH (ref 0–25)
Total Nucleated Cell Count, Fluid: 59 cu mm (ref 0–1000)

## 2021-12-16 LAB — CBC
HCT: 30.9 % — ABNORMAL LOW (ref 39.0–52.0)
Hemoglobin: 10.9 g/dL — ABNORMAL LOW (ref 13.0–17.0)
MCH: 34.4 pg — ABNORMAL HIGH (ref 26.0–34.0)
MCHC: 35.3 g/dL (ref 30.0–36.0)
MCV: 97.5 fL (ref 80.0–100.0)
Platelets: 150 10*3/uL (ref 150–400)
RBC: 3.17 MIL/uL — ABNORMAL LOW (ref 4.22–5.81)
RDW: 13.6 % (ref 11.5–15.5)
WBC: 7.8 10*3/uL (ref 4.0–10.5)
nRBC: 0 % (ref 0.0–0.2)

## 2021-12-16 LAB — URINE CULTURE: Culture: 30000 — AB

## 2021-12-16 LAB — RENAL FUNCTION PANEL
Albumin: 2.3 g/dL — ABNORMAL LOW (ref 3.5–5.0)
Anion gap: 11 (ref 5–15)
BUN: 41 mg/dL — ABNORMAL HIGH (ref 8–23)
CO2: 27 mmol/L (ref 22–32)
Calcium: 9 mg/dL (ref 8.9–10.3)
Chloride: 99 mmol/L (ref 98–111)
Creatinine, Ser: 5.83 mg/dL — ABNORMAL HIGH (ref 0.61–1.24)
GFR, Estimated: 9 mL/min — ABNORMAL LOW (ref >60)
Glucose, Bld: 124 mg/dL — ABNORMAL HIGH (ref 70–99)
Phosphorus: 2.7 mg/dL (ref 2.5–4.6)
Potassium: 3 mmol/L — ABNORMAL LOW (ref 3.5–5.1)
Sodium: 137 mmol/L (ref 135–145)

## 2021-12-16 LAB — PATHOLOGIST SMEAR REVIEW

## 2021-12-16 MED ORDER — POTASSIUM CHLORIDE CRYS ER 20 MEQ PO TBCR
40.0000 meq | EXTENDED_RELEASE_TABLET | Freq: Once | ORAL | Status: AC
  Administered 2021-12-16: 40 meq via ORAL
  Filled 2021-12-16: qty 2

## 2021-12-16 MED ORDER — MIRTAZAPINE 15 MG PO TABS
7.5000 mg | ORAL_TABLET | Freq: Every day | ORAL | Status: DC
  Administered 2021-12-16: 7.5 mg via ORAL
  Filled 2021-12-16 (×2): qty 1

## 2021-12-16 MED ORDER — CEFAZOLIN SODIUM-DEXTROSE 1-4 GM/50ML-% IV SOLN
1.0000 g | INTRAVENOUS | Status: DC
  Administered 2021-12-16: 1 g via INTRAVENOUS
  Filled 2021-12-16 (×2): qty 50

## 2021-12-16 NOTE — Progress Notes (Signed)
Pt in bed no c/os PD cath WDL tx initiated without difficulty

## 2021-12-16 NOTE — Progress Notes (Addendum)
PROGRESS NOTE    [name]  OIZ:[id number] DOB: [birth year] DOA: 12/13/2021 PCP: Celene Squibb, MD   Chief Complaint  Patient presents with   Abdominal Pain    Brief Narrative:   [name] is a 76 y.o. male with medical history significant for ESRD on peritoneal dialysis (makes urine), anemia of chronic disease, prior CVA, dysphagia on pured diet, paroxysmal A-flutter, followed by cardiology (was on Eliquis, discontinued per cardiology), coronary artery disease, aortic stenosis status post AVR with bioprosthetic valve, moderate MS and moderate TR, essential hypertension, hyperlipidemia, hypothyroidism, history of PE, chronic constipation, followed by GI, chronic anxiety/depression who presented to [facility] ED from home with complaints of abdominal pain and cloudy dialysate fluid.  Patient's family called PD nurse who recommended to present to the ED for further evaluation given concern for peritonitis.  Peritoneal fluid analysis was done in the ED.  Few gram-positive cocci seen on Gram stain with moderate WBC present, predominantly PMN.  The patient was started on IV vancomycin and cefepime empirically in the ED.  Recently treated with antibiotics for urinary tract infection that presented with hematuria, completed course of antibiotics a week ago.  Hematuria returned yesterday and has now cleared at the time of this visit.  TRH, hospitalist service was asked to admit.   Assessment & Plan:   Principal Problem:   Peritonitis ([place]) Active Problems:   Pressure injury of skin   PD catheter- related peritonitis -Initially on IV vancomycin, cefepime, urine culture growing negative rods, PD aspirate/intraperitoneal culture growing Staph epidermidis -On IV vancomycin and cefepime initially, narrow antibiotic coverage to IV cefazolin given now more susceptibility data available , this can be transitioned to intraperitoneal upon discharge .   ESRD on PD with concern for peritonitis Renal  consulted On peritoneal dialysis during hospital stay  UTI -Coli, pansensitive, continue with cefazolin   Hypervolemic hyponatremia Mild, management with dialysis  Hypokalemia Low, management with dialysis and replacement as needed  Dysphagia -Self-pity consult appreciated yesterday -Discussed with SLP, patient not a candidate currently for PEG given overall poor health condition, and with peritonitis.   History of CVA Diagnosed 2 months ago -Continue with aspirin and statin -ET/OT/SLP consulted, recommendation for CIR.    Hyperglycemia -No recurrence -A1c of 5.5  Isolated elevated AST AST mildly elevated 54 Trend Repeat CMP in the morning   Pressure ulcer Pressure Injury 12/14/21 Buttocks Right Stage 2 -  Partial thickness loss of dermis presenting as a shallow open injury with a red, pink wound bed without slough. (Active)  12/14/21 0937  Location: Buttocks  Location Orientation: Right  Staging: Stage 2 -  Partial thickness loss of dermis presenting as a shallow open injury with a red, pink wound bed without slough.  Wound Description (Comments):   Present on Admission: Yes  Wound care consulted  Goals of care -I have discussed with wife about goals of care, patient remains DNR, no heroics, but overall she understands long-term poor prognosis in the setting of dialysis, and recent significant CVA, for now plan to continue with medical management, no heroics, and recommendation for palliative medicine as an outpatient to keep following.  Will start on Remeron to improve his appetite.  Ask about tube feed for short-term, I have discussed with her that he is a poor candidate for tube feed peritoneal dialysis, peritonitis.  DVT prophylaxis: [place] heparin Code Status: Full Family Communication: D/W wife at [place] 6/19, and on 6/20 Disposition: CIR assessing the patient for admission  Status  is: Inpatient    Consultants:  Renal  Subjective:  No fever, no chills, no  nausea or vomiting today  Objective: Vitals:   12/16/21 0113 12/16/21 0319 12/16/21 0751 12/16/21 1112  BP: 117/62 134/81 134/82 130/76  Pulse: 65 66  66  Resp: '18 18 16 16  '$ Temp: 98.4 F (36.9 C) 98.3 F (36.8 C)    TempSrc: Oral Oral    SpO2: 98% 98%    Weight:  102.6 kg    Height:        Intake/Output Summary (Last 24 hours) at 12/16/2021 1320 Last data filed at 12/16/2021 0322 Gross per 24 hour  Intake 0 ml  Output 400 ml  Net -400 ml   Filed Weights   12/14/21 0910 12/15/21 0423 12/16/21 0319  Weight: 102 kg 102.9 kg 102.6 kg    Examination:  Awake Alert, ill-appearing, impaired cognition and insight, in no apparent distress, slurred speech at baseline Symmetrical Chest wall movement, Good air movement bilaterally, CTAB RRR,No Gallops,Rubs or new Murmurs, No Parasternal Heave +ve B.Sounds, Abd Soft, No tenderness, No rebound - guarding or rigidity. No Cyanosis, Clubbing or edema, No new Rash or bruise      Data Reviewed: I have personally reviewed following labs and imaging studies  CBC: Recent Labs  Lab 12/13/21 2350 12/14/21 0558 12/15/21 0410 12/16/21 0418  WBC 14.6* 9.9 8.2 7.8  NEUTROABS 12.9* 7.6  --   --   HGB 15.0 13.4 11.1* 10.9*  HCT 41.1 37.1* 30.0* 30.9*  MCV 95.4 95.9 94.3 97.5  PLT 198 173 139* 774    Basic Metabolic Panel: Recent Labs  Lab 12/13/21 2350 12/14/21 0558 12/15/21 0410 12/15/21 0840 12/16/21 0418  NA 134* 135 134*  --  137  K 2.7* 3.1* 2.4* 3.1* 3.0*  CL 92* 96* 97*  --  99  CO2 '26 25 23  '$ --  27  GLUCOSE 187* 125* 148*  --  124*  BUN 40* 42* 43*  --  41*  CREATININE 6.74* 6.53* 6.28*  --  5.83*  CALCIUM 9.8 8.9 8.8*  --  9.0  MG  --  1.6*  --   --   --   PHOS  --  3.1  --   --  2.7    GFR: Estimated Creatinine Clearance: 12.5 mL/min (A) (by C-G formula based on SCr of 5.83 mg/dL (H)).  Liver Function Tests: Recent Labs  Lab 12/13/21 2350 12/14/21 0558 12/15/21 0410 12/16/21 0418  AST 54* 56* 31  --    ALT '21 18 16  '$ --   ALKPHOS 85 69 60  --   BILITOT 0.6 0.8 0.6  --   PROT 7.7 6.4* 5.4*  --   ALBUMIN 3.3* 2.7* 2.2* 2.3*    CBG: No results for input(s): "GLUCAP" in the last 168 hours.   Recent Results (from the past 240 hour(s))  Blood culture (routine x 2)     Status: None (Preliminary result)   Collection Time: 12/13/21 11:50 PM   Specimen: BLOOD  Result Value Ref Range Status   Specimen Description BLOOD RIGHT ARM  Final   Special Requests   Final    BOTTLES DRAWN AEROBIC AND ANAEROBIC Blood Culture adequate volume   Culture   Final    NO GROWTH 2 DAYS Performed at [facility], [address] [place], [place] [address]    Report Status PENDING  Incomplete  Blood culture (routine x 2)     Status: None (  Preliminary result)   Collection Time: 12/14/21 12:15 AM   Specimen: BLOOD RIGHT HAND  Result Value Ref Range Status   Specimen Description BLOOD RIGHT HAND  Final   Special Requests   Final    BOTTLES DRAWN AEROBIC AND ANAEROBIC Blood Culture adequate volume   Culture   Final    NO GROWTH 2 DAYS Performed at [facility], [address], [place], [place] [address]    Report Status PENDING  Incomplete  Body fluid culture w Gram Stain     Status: None   Collection Time: 12/14/21 12:37 AM   Specimen: Peritoneal Cavity; Peritoneal Fluid  Result Value Ref Range Status   Specimen Description PERITONEAL CAVITY  Final   Special Requests NONE  Final   Gram Stain   Final    CYTOSPIN SMEAR MODERATE WBC PRESENT, PREDOMINANTLY PMN FEW GRAM POSITIVE COCCI Performed at [facility], [place] [address] [place], [place] [address]    Culture RARE STAPHYLOCOCCUS EPIDERMIDIS  Final   Report Status 12/16/2021 FINAL  Final   Organism ID, Bacteria STAPHYLOCOCCUS EPIDERMIDIS  Final      Susceptibility   Staphylococcus epidermidis - MIC*    CIPROFLOXACIN <=0.5 SENSITIVE Sensitive     ERYTHROMYCIN <=0.25 SENSITIVE Sensitive     GENTAMICIN <=0.5 SENSITIVE Sensitive      OXACILLIN <=0.25 SENSITIVE Sensitive     TETRACYCLINE <=1 SENSITIVE Sensitive     VANCOMYCIN <=0.5 SENSITIVE Sensitive     TRIMETH/SULFA <=10 SENSITIVE Sensitive     CLINDAMYCIN <=0.25 SENSITIVE Sensitive     RIFAMPIN <=0.5 SENSITIVE Sensitive     Inducible Clindamycin NEGATIVE Sensitive     * RARE STAPHYLOCOCCUS EPIDERMIDIS  Urine Culture     Status: Abnormal   Collection Time: 12/14/21  4:33 AM   Specimen: In/Out Cath Urine  Result Value Ref Range Status   Specimen Description IN/OUT CATH URINE  Final   Special Requests   Final    NONE Performed at [facility], [place] [address] [place], [place] [address]    Culture 30,000 COLONIES/mL ESCHERICHIA COLI (A)  Final   Report Status 12/16/2021 FINAL  Final   Organism ID, Bacteria ESCHERICHIA COLI (A)  Final      Susceptibility   Escherichia coli - MIC*    AMPICILLIN <=2 SENSITIVE Sensitive     CEFAZOLIN <=4 SENSITIVE Sensitive     CEFEPIME <=0.12 SENSITIVE Sensitive     CEFTRIAXONE <=0.25 SENSITIVE Sensitive     CIPROFLOXACIN <=0.25 SENSITIVE Sensitive     GENTAMICIN <=1 SENSITIVE Sensitive     IMIPENEM <=0.25 SENSITIVE Sensitive     NITROFURANTOIN <=16 SENSITIVE Sensitive     TRIMETH/SULFA <=20 SENSITIVE Sensitive     AMPICILLIN/SULBACTAM <=2 SENSITIVE Sensitive     PIP/TAZO <=4 SENSITIVE Sensitive     * 30,000 COLONIES/mL ESCHERICHIA COLI         Radiology Studies: No results found.      Scheduled Meds:  aspirin EC  81 mg Oral Daily   finasteride  5 mg Oral Daily   gentamicin cream  1 Application Topical Daily   heparin  5,000 Units Subcutaneous Q8H   levothyroxine  175 mcg Oral QAC breakfast   pantoprazole  40 mg Oral Daily   sertraline  50 mg Oral Daily   simvastatin  40 mg Oral QPM   spironolactone  25 mg Oral Daily   vortioxetine HBr  20 mg Oral Daily   Continuous Infusions:   ceFAZolin (  ANCEF) IV     dialysis solution 1.5% low-MG/low-CA       LOS: 2 days       Phillips Climes,  MD [facility]   To contact the attending provider between 7A-7P or the covering provider during after hours 7P-7A, please log into the web site [email or web address] and access using universal [place] password for that web site. If you do not have the password, please call the hospital operator.  12/16/2021, 1:20 PM

## 2021-12-16 NOTE — Progress Notes (Addendum)
[facility] Progress Note  Name: [name] MRN: [id number] DOB: [birth year]  Chief Complaint:  Cloudy PD fluid   Subjective:  PD RN is at bedside to disconnect patient.  He states that he feels ok this morning.  Still some abdominal tenderness but this seems better.  I tried to call his wife and did not reach her via phone this am.  Review of systems:  Denies shortness of breath or chest pain  Denies n/v Still somewhat limited given AMS  1013 mL net UF thus far with PD overnight ending 6/20 am 504 mL net UF with PD overnight ending 6/19 am. ---------- Background on consult:   Reason for Consult: ESRD pt on PD w/ cloudy fluid HPI: The patient is a 76 y.o. year-old w/ hx of AS sp AVR, DJD, CAD sp CABGx1, COPD, depression, ESRD on PD, HTN, gout, HL, hx PE who developed cloudy PD fluid which was noted yesterday. Their PD nurse instructed them to go to ED. Also has hx of CVA and some speech and swallowing difficulties. Pt had been afebrile. Also hx of atrial flutter. In ED PD fluid was sampled and sent to lab and patient received IV abx.  PD cell count was 8300 and gram stain showed wbc's and GPC. Pt was admitted. We are asked to see for CCPD.     Pt seen in room.  Family states he hasn't been "himself" for about 6- 8 wks, they saw a neurologist in OP setting I believe, and they say that he "had a stroke" but they say that they didn't understand why he hasn't eaten for 8-10 days, and they don't seem comfortable with his overall neuro status. Patient has not been confused. Speech is hard to discern at times. Pt states he is having significant problems swallowing food. Family notes that "he is taking his pills though". They live in [place]. On PD for about 1 year, no HD access placed yet. Pt answers questions relatively appropriately.   Additional hx - outpatient team/SW has discussed end of life with patient but they do not wish to stop PD yet but he's declining.  eating very  little.  Fill volumes estimated at 2.4 liter per RN (per her recall - she didn't have access to his chart) and last bag fill of icodextran 2 liters. 5 cycles over 9 hours. No hx of peritonitis.  His wife has continued to work and his neighbor has been unhooking it.   Intake/Output Summary (Last 24 hours) at 12/16/2021 0853 Last data filed at 12/16/2021 0322 Gross per 24 hour  Intake 0 ml  Output 400 ml  Net -400 ml    Vitals:  Vitals:   12/15/21 0844 12/15/21 2039 12/16/21 0113 12/16/21 0319  BP: 121/78 107/87 117/62 134/81  Pulse: 65 67 65 66  Resp: '18 18 18 18  '$ Temp: 98 F (36.7 C) 98.8 F (37.1 C) 98.4 F (36.9 C) 98.3 F (36.8 C)  TempSrc: Oral Oral Oral Oral  SpO2: 100% 97% 98% 98%  Weight:    102.6 kg  Height:         Physical Exam:  General elderly gentleman in bed in no acute distress HEENT normocephalic atraumatic extraocular movements intact sclera anicteric Neck supple trachea midline Lungs clear to auscultation bilaterally normal work of breathing at rest on room air Heart S1S2 no rub Abdomen soft nontender nondistended/obese habitus  Extremities no edema  Psych calm mood and affect Neuro - confused.  not oriented to  time - the year is 32; able to provide name and birthdate.  PD catheter in place   Medications reviewed   Labs:     Latest Ref Rng & Units 12/16/2021    4:18 AM 12/15/2021    8:40 AM 12/15/2021    4:10 AM  BMP  Glucose 70 - 99 mg/dL 124   148   BUN 8 - 23 mg/dL 41   43   Creatinine 0.61 - 1.24 mg/dL 5.83   6.28   Sodium 135 - 145 mmol/L 137   134   Potassium 3.5 - 5.1 mmol/L 3.0  3.1  2.4  C  Chloride 98 - 111 mmol/L 99   97   CO2 22 - 32 mmol/L 27   23   Calcium 8.9 - 10.3 mg/dL 9.0   8.8     C Corrected result   Outpatient PD orders:  [facility] [place] 2.4 liter per RN (per her recall) and last bag fill of icodextran 2 liters. 5 cycles over 9 hours. Outpatient PD RN Malachy Mood [phone].     Assessment/Plan:   PD catheter-  related peritonitis - TNC from 6/17 sample was 8306 with 97% pmn's and gram stain showed GPC.  lower abd TTP. Getting IV abx now w/ gram + and gram neg coverage.   PD fluid culture with staph epi Will continue with IV antibiotics here and upon dc he will get intraperitoneal antibiotics through his renal provider Update PD fluid cell count today  Currently on ceftazidime.  Ok with me to narrow abx to ancef 1 gram every 24 hours.  Spoke with pharmacy and pharmacist is placing orders after checking with primary team  Plan for 2 weeks of therapy - end date 12/27/21   ESRD - on CCPD x 1 year.  [facility] [place].  5 exchanges with 2 L dwells here - mix 1.5% and 2.5% dextrose tonight  Hypokalemia - repleted this am.  Replete with 40 meq of K once today  Slurred speech/ dysphagia - hx of recent CVA per family noted in the OP setting. They are saying he is "not the same person" that he was before whatever event happened, this is over the last 1-2 mos.  Hypothyroidism - per pmd Metabolic bone disease ckd - Ca and phos in range.  Paused phoslo for now as not really eating well and phos low normal Anemia esrd - no esa needs at this time. Hb 10.9  Disposition - Would continue inpatient monitoring with change in antibiotics.  Await cell counts from today as well.  Per outpatient PD RN he has had considerable decline over the past few months. Would benefit from continued goals of care discussions  Claudia Desanctis, MD 12/16/2021 9:04 AM   Spoke with patient's wife at bedside - his RN alerted me that she was there.  Discussed abx.  They are discussing possible rehab.  Should he go to a SNF he would need to transition to HD.  If he does inpatient rehab here he would be able to continue with PD.  I discussed this with his wife as well.  She states that she has been managing the PD at home.  His neighbor assists him during the day.   Claudia Desanctis, MD 12/16/2021 9:37 AM   I spoke with his outpatient PD RN.  I let  her know of recs for ancef 1 gram intraperitoneal daily until 12/27/21.  She states that she is going to contact his outpatient  nephrologist for the order and have antibiotics shipped to his house.  He will need to get his ancef here tomorrow but from my standpoint would be acceptable for discharge on 6/21 after he has gotten his ancef dose.  Do recommend continued goals of care discussions and making sure that he has everything he needs at home from a PT standpoint.    Claudia Desanctis, MD 4:54 PM 12/16/2021   Marlane Hatcher Katrin Grabel,MD 4:51 PM 12/16/2021

## 2021-12-16 NOTE — Progress Notes (Signed)
Tx interupted/delayed due to drain alarms..Dr Royce Macadamia ordered to terminate tx.

## 2021-12-16 NOTE — Progress Notes (Signed)
Modified Barium Swallow Progress Note  Patient Details  Name: LARUE LIGHTNER MRN: [id number] Date of Birth: [birth year]  Today's Date: 12/16/2021  Modified Barium Swallow completed.  Full report located under Chart Review in the Imaging Section.  Brief recommendations include the following:  Clinical Impression  Pt demonstrates moderate oral dysphagia given missing dentition and cognitive impairment. Pt is able to masticate soft solids with him gums, but is bothered by particulate oral residue and fixates on this. An oral wash is helpful, but suspect pt will have ongoing struggle with mastication above soft meltable solids like cake. Pts oropharyngeal dysphagia is characterized by delayed swallow initaition, complicated by slight posterior head tilt. Pt will aspirate thin and nectar thick liquids if head tilted backwards. A head in neutral improves tolerance of nectar via straw, and a chin tuck prevents aspiraiton of thin liquids. Pt needs constant cues to move head forward and or tuck chin and does not always sustain the position without assist which has made it difficult for caregivers to utilize. Posterior head tilt likely also contributing to gagging with purees as these will also spill to vestibule prior to swallow at times.  Esophageal sweep appeared WNL. Aspiration and coughing can be reduced with the use of thickened liquids, which the pt may reject. Pt is being given smoothies that are already appropriately thick and pt does toelrate these well. Recommend dys 1/nectar thick diet while admitted. Expect that decreased intake and overt dysphagia will continue to be a problem into the future.   Swallow Evaluation Recommendations       SLP Diet Recommendations: Dysphagia 1 (Puree) solids;Nectar thick liquid   Liquid Administration via: Cup;Straw   Medication Administration: Whole meds with puree   Supervision: Full supervision/cueing for compensatory strategies   Compensations: Slow  rate;Chin tuck;Use straw to facilitate chin tuck   Postural Changes: Seated upright at 90 degrees            Yatzil Clippinger, Katherene Ponto 12/16/2021,12:09 PM

## 2021-12-16 NOTE — Progress Notes (Signed)
Inpatient Rehabilitation Admissions Coordinator   I met at bedside with patient and his wife. Up until 4 weeks ago patient was ambulatory with assist of RW. Gradual decline to nonambulatory up to 1 to 2 weeks prior to admit. Wife manages his CCPD and her neighbor, Jacqlyn Larsen assist when wife works. I discussed goals and expectations of Cir admits. I discussed that he is currently not at that level for the intensity required. We discussed her overall goals for quality of his life and her goal to get him home and able [place]. She is aware that palliative goals of care discussions have been recommended and she is open to discuss. I will follow his progress to assist with needs.  Danne Baxter, RN, MSN Rehab Admissions Coordinator [phone] 12/16/2021 2:23 PM

## 2021-12-17 DIAGNOSIS — K659 Peritonitis, unspecified: Secondary | ICD-10-CM | POA: Diagnosis not present

## 2021-12-17 LAB — CBC
HCT: 32 % — ABNORMAL LOW (ref 39.0–52.0)
Hemoglobin: 11.3 g/dL — ABNORMAL LOW (ref 13.0–17.0)
MCH: 34.2 pg — ABNORMAL HIGH (ref 26.0–34.0)
MCHC: 35.3 g/dL (ref 30.0–36.0)
MCV: 97 fL (ref 80.0–100.0)
Platelets: 158 10*3/uL (ref 150–400)
RBC: 3.3 MIL/uL — ABNORMAL LOW (ref 4.22–5.81)
RDW: 13.7 % (ref 11.5–15.5)
WBC: 8.7 10*3/uL (ref 4.0–10.5)
nRBC: 0 % (ref 0.0–0.2)

## 2021-12-17 LAB — BASIC METABOLIC PANEL
Anion gap: 10 (ref 5–15)
BUN: 39 mg/dL — ABNORMAL HIGH (ref 8–23)
CO2: 26 mmol/L (ref 22–32)
Calcium: 9.1 mg/dL (ref 8.9–10.3)
Chloride: 103 mmol/L (ref 98–111)
Creatinine, Ser: 5.57 mg/dL — ABNORMAL HIGH (ref 0.61–1.24)
GFR, Estimated: 10 mL/min — ABNORMAL LOW (ref >60)
Glucose, Bld: 94 mg/dL (ref 70–99)
Potassium: 3.4 mmol/L — ABNORMAL LOW (ref 3.5–5.1)
Sodium: 139 mmol/L (ref 135–145)

## 2021-12-17 MED ORDER — CEFAZOLIN SODIUM-DEXTROSE 1-4 GM/50ML-% IV SOLN
1.0000 g | INTRAVENOUS | Status: AC
Start: 2021-12-17 — End: 2021-12-27

## 2021-12-17 MED ORDER — SODIUM CHLORIDE 0.9 % IV BOLUS
250.0000 mL | Freq: Once | INTRAVENOUS | Status: AC
  Administered 2021-12-17: 250 mL via INTRAVENOUS

## 2021-12-17 MED ORDER — POTASSIUM CHLORIDE CRYS ER 20 MEQ PO TBCR
20.0000 meq | EXTENDED_RELEASE_TABLET | Freq: Once | ORAL | Status: AC
  Administered 2021-12-17: 20 meq via ORAL
  Filled 2021-12-17: qty 1

## 2021-12-17 NOTE — Progress Notes (Signed)
[facility] Progress Note  Name: [name] MRN: [id number] DOB: 22-Aug-1945  Chief Complaint:  Cloudy PD fluid   Subjective:  He feels ok this am.  Has been harder to move around to drain with PD given a decrease in his functional status.  They do want to continue with PD.  Spoke with his wife at bedside and they are planning on going home and establishing with palliative.    Review of systems:  Denies shortness of breath or chest pain  Denies n/v Still somewhat limited given AMS Having bm's.   1013 mL net UF thus far with PD overnight ending 6/20 am 504 mL net UF with PD overnight ending 6/19 am. ---------- Background on consult:   Reason for Consult: ESRD pt on PD w/ cloudy fluid HPI: The patient is a 76 y.o. year-old w/ hx of AS sp AVR, DJD, CAD sp CABGx1, COPD, depression, ESRD on PD, HTN, gout, HL, hx PE who developed cloudy PD fluid which was noted yesterday. Their PD nurse instructed them to go to ED. Also has hx of CVA and some speech and swallowing difficulties. Pt had been afebrile. Also hx of atrial flutter. In ED PD fluid was sampled and sent to lab and patient received IV abx.  PD cell count was 8300 and gram stain showed wbc's and GPC. Pt was admitted. We are asked to see for CCPD.     Pt seen in room.  Family states he hasn't been "himself" for about 6- 8 wks, they saw a neurologist in OP setting I believe, and they say that he "had a stroke" but they say that they didn't understand why he hasn't eaten for 8-10 days, and they don't seem comfortable with his overall neuro status. Patient has not been confused. Speech is hard to discern at times. Pt states he is having significant problems swallowing food. Family notes that "he is taking his pills though". They live in [place]. On PD for about 1 year, no HD access placed yet. Pt answers questions relatively appropriately.   Additional hx - outpatient team/SW has discussed end of life with patient but they  do not wish to stop PD yet but he's declining.  eating very little.  Fill volumes estimated at 2.4 liter per RN (per her recall - she didn't have access to his chart) and last bag fill of icodextran 2 liters. 5 cycles over 9 hours. No hx of peritonitis.  His wife has continued to work and his neighbor has been unhooking it.   Intake/Output Summary (Last 24 hours) at 12/17/2021 0758 Last data filed at 12/17/2021 0300 Gross per 24 hour  Intake 920 ml  Output 900 ml  Net 20 ml    Vitals:  Vitals:   12/16/21 0751 12/16/21 1112 12/16/21 2141 12/17/21 0637  BP: 134/82 130/76 (!) 145/80 (!) 128/48  Pulse:  66 69 68  Resp: '16 16 16 16  '$ Temp:   98.7 F (37.1 C) 98.1 F (36.7 C)  TempSrc:   Oral Oral  SpO2:   97% 96%  Weight:    101.9 kg  Height:         Physical Exam:   General elderly gentleman in bed in no acute distress HEENT normocephalic atraumatic extraocular movements intact sclera anicteric Neck supple trachea midline Lungs clear to auscultation bilaterally normal work of breathing at rest on room air Heart S1S2 no rub Abdomen soft nontender nondistended/obese habitus  Extremities no edema  Psych calm mood  and affect Neuro - confused.  not oriented to time - the year is 1973; able to provide name and location PD catheter in place   Medications reviewed   Labs:     Latest Ref Rng & Units 12/17/2021    2:59 AM 12/16/2021    4:18 AM 12/15/2021    8:40 AM  BMP  Glucose 70 - 99 mg/dL 94  124    BUN 8 - 23 mg/dL 39  41    Creatinine 0.61 - 1.24 mg/dL 5.57  5.83    Sodium 135 - 145 mmol/L 139  137    Potassium 3.5 - 5.1 mmol/L 3.4  3.0  3.1   Chloride 98 - 111 mmol/L 103  99    CO2 22 - 32 mmol/L 26  27    Calcium 8.9 - 10.3 mg/dL 9.1  9.0     Outpatient PD orders:  [facility] [place] 2.4 liter per RN (per her recall) and last bag fill of icodextran 2 liters. 5 cycles over 9 hours. Outpatient PD RN Malachy Mood [phone].     Assessment/Plan:   PD catheter- related  peritonitis - TNC from 6/17 sample was 8306 with 97% pmn's and gram stain showed GPC.  lower abd TTP. Getting IV abx now w/ gram + and gram neg coverage.  PD fluid culture with staph epi.  Repeat cell count improved Will continue with IV antibiotics here and upon dc he will get intraperitoneal antibiotics through his renal provider.   As below does need ancef here today I spoke with his outpatient PD RN on 6/20.  I let her know of recs for ancef 1 gram intraperitoneal daily until 12/27/21.  ESRD - on CCPD x 1 year.  [facility] [place].  5 exchanges with 2 L dwells here - mix 1.5% and 2.5% dextrose tonight if here  Hypokalemia - Replete with 20 meq of K once today  Slurred speech/ dysphagia - hx of recent CVA per family noted in the OP setting. They are saying he is "not the same person" that he was before whatever event happened, this is over the last 1-2 mos.  Hypothyroidism - per pmd Metabolic bone disease ckd - Ca and phos in range.  Paused phoslo for now as not really eating well and phos low normal - can reassess on outpatient follow-up with home training Anemia esrd - no esa needs at this time. Hb 11.3  Disposition - He will need to get his ancef here today but from my standpoint would be acceptable for discharge on 6/21 after he has gotten his ancef dose.  Do recommend continued goals of care discussions and making sure that he has everything he needs at home from a PT standpoint.  Should he go to a SNF he would need to transition to HD.  If he does inpatient rehab here he would be able to continue with PD.    Claudia Desanctis, MD 12/17/2021 8:57 AM

## 2021-12-17 NOTE — Discharge Instructions (Signed)
[name],  You are in the hospital with an infection in your abdomen called peritonitis.  This is related to you peritoneal dialysis.  You have been treated successfully with antibiotics and will continue treatment till July 1.  This to be managed by your home dialysis agency.  Please follow-up with your nephrologist and your primary care physician as an outpatient.

## 2021-12-17 NOTE — Discharge Summary (Incomplete)
Physician Discharge Summary   Patient: [name] MRN: [id number] DOB: 1945-12-13  Admit date:     12/13/2021  Discharge date: {dischdate:26783}  Discharge Physician: Cordelia Poche   PCP: Celene Squibb, MD   Recommendations at discharge:  {Tip this will not be part of the note when signed- Example include specific recommendations for outpatient follow-up, pending tests to follow-up on. (Optional):26781}  ***  Discharge Diagnoses: Principal Problem:   Peritonitis ([facility]) Active Problems:   Hypertension   Thyroid disease   Pressure injury of skin   UTI (urinary tract infection)   History of CVA (cerebrovascular accident)  Resolved Problems:   * No resolved hospital problems. [facility] Course: No notes on file  Assessment and Plan: No notes have been filed under this hospital service. Service: Hospitalist     {Tip this will not be part of the note when signed Body mass index is 34.16 kg/m. , ,  Active Pressure Injury/Wound(s)     Pressure Ulcer  Duration          Pressure Injury 12/14/21 Buttocks Right Stage 2 -  Partial thickness loss of dermis presenting as a shallow open injury with a red, pink wound bed without slough. 3 days           (Optional):26781}  {(NOTE) Pain control PDMP Statment (Optional):26782} Consultants: *** Procedures performed: ***  Disposition: {Plan; Disposition:26390} Diet recommendation:  {Diet_Plan:26776} DISCHARGE MEDICATION: Allergies as of 12/17/2021   No Known Allergies   Med Rec must be completed prior to using this [facility]***       Follow-up Information     [place] Follow up.   Why: Resume HHPT,HHOT, [facility] will contact you to set up apt times. [place], John Z, MD. Schedule an appointment as soon as possible for a visit in 1 week(s).   Specialty: Internal Medicine Why: For hospital follow-up Contact information: [place] [facility]  [address] [phone]         Satira Sark, MD .   Specialty: Cardiology Contact information: [place] [address] [phone]                Discharge Exam: Danley Danker Weights   12/15/21 0423 12/16/21 0319 12/17/21 0637  Weight: 102.9 kg 102.6 kg 101.9 kg   ***  Condition at discharge: {DC Condition:26389}  The results of significant diagnostics from this hospitalization (including imaging, microbiology, ancillary and laboratory) are listed below for reference.   Imaging Studies: DG Chest Portable 1 View  Result Date: 12/14/2021 CLINICAL DATA:  ams, abdominal pain EXAM: PORTABLE CHEST 1 VIEW COMPARISON:  Chest x-ray 11/19/2021 FINDINGS: Cardiomegaly. The heart and mediastinal contours are unchanged. Aortic calcification. Low lung volumes. Slightly improved aeration of the lower lungs with persistent retrocardiac and right middle lobe airspace opacity. No pulmonary edema. No pleural effusion. No pneumothorax. No acute osseous abnormality. IMPRESSION: Low lung volumes with slightly improved aeration of the lower lungs with persistent retrocardiac and right middle lobe airspace opacity. Followup PA and lateral chest X-ray is recommended in 3-4 weeks following therapy to ensure resolution and exclude underlying malignancy. Electronically Signed   By: Iven Finn M.D.   On: 12/14/2021 00:00   MR Brain Wo Contrast (neuro protocol)  Result Date: 11/19/2021 CLINICAL DATA:  Acute neuro deficit.  Abnormal balance. EXAM: MRI HEAD WITHOUT CONTRAST MRA HEAD WITHOUT CONTRAST TECHNIQUE: Multiplanar, multi-echo pulse sequences of  the brain and surrounding structures were acquired without intravenous contrast. Angiographic images of the Circle of Willis were acquired using MRA technique without intravenous contrast. COMPARISON:  CT head 07/06/2017, MRI 09/20/2008 FINDINGS: MRI HEAD FINDINGS Brain: Scattered small areas of mildly increased signal on diffusion-weighted  imaging without definite restricted diffusion on ADC map. Possible subacute or chronic infarct involving the right frontal parietal white matter and cerebellum bilaterally. Some of these areas show increased signal on FLAIR. Image quality degraded by motion. Patient not able to complete the study. Generalized atrophy without hydrocephalus. No mass or midline shift. Chronic microvascular ischemia is present. Vascular: Normal arterial flow voids at the skull base Skull and upper cervical spine: No focal skeletal abnormality. Sinuses/Orbits: Paranasal sinuses clear. Bilateral cataract extraction Other: None MRA HEAD FINDINGS Anterior circulation: Internal carotid artery widely patent with mild atherosclerotic irregularity. Anterior and middle cerebral arteries patent bilaterally. Atherosclerotic stenosis in MCA branches bilaterally. Moderate stenosis superior division right MCA. No large vessel occlusion. Negative for aneurysm Posterior circulation: Both vertebral arteries patent to the basilar. PICA not included on the study. Basilar widely patent. Posterior cerebral arteries patent bilaterally. Atherosclerotic irregularity in the distal PCA bilaterally. Fetal origin right PCA. No aneurysm. Anatomic variants: None IMPRESSION: Multiple small areas of increased signal on diffusion imaging without definite restricted diffusion on ADC. These areas include the right frontal parietal white matter and cerebellum bilaterally. These may be due to subacute or chronic infarct. Intracranial atherosclerotic disease in the middle cerebral arteries and posterior cerebral arteries bilaterally. No large vessel occlusion. Electronically Signed   By: Franchot Gallo M.D.   On: 11/19/2021 18:26   MR ANGIO HEAD WO CONTRAST  Result Date: 11/19/2021 CLINICAL DATA:  Acute neuro deficit.  Abnormal balance. EXAM: MRI HEAD WITHOUT CONTRAST MRA HEAD WITHOUT CONTRAST TECHNIQUE: Multiplanar, multi-echo pulse sequences of the brain and  surrounding structures were acquired without intravenous contrast. Angiographic images of the Circle of Willis were acquired using MRA technique without intravenous contrast. COMPARISON:  CT head 07/06/2017, MRI 09/20/2008 FINDINGS: MRI HEAD FINDINGS Brain: Scattered small areas of mildly increased signal on diffusion-weighted imaging without definite restricted diffusion on ADC map. Possible subacute or chronic infarct involving the right frontal parietal white matter and cerebellum bilaterally. Some of these areas show increased signal on FLAIR. Image quality degraded by motion. Patient not able to complete the study. Generalized atrophy without hydrocephalus. No mass or midline shift. Chronic microvascular ischemia is present. Vascular: Normal arterial flow voids at the skull base Skull and upper cervical spine: No focal skeletal abnormality. Sinuses/Orbits: Paranasal sinuses clear. Bilateral cataract extraction Other: None MRA HEAD FINDINGS Anterior circulation: Internal carotid artery widely patent with mild atherosclerotic irregularity. Anterior and middle cerebral arteries patent bilaterally. Atherosclerotic stenosis in MCA branches bilaterally. Moderate stenosis superior division right MCA. No large vessel occlusion. Negative for aneurysm Posterior circulation: Both vertebral arteries patent to the basilar. PICA not included on the study. Basilar widely patent. Posterior cerebral arteries patent bilaterally. Atherosclerotic irregularity in the distal PCA bilaterally. Fetal origin right PCA. No aneurysm. Anatomic variants: None IMPRESSION: Multiple small areas of increased signal on diffusion imaging without definite restricted diffusion on ADC. These areas include the right frontal parietal white matter and cerebellum bilaterally. These may be due to subacute or chronic infarct. Intracranial atherosclerotic disease in the middle cerebral arteries and posterior cerebral arteries bilaterally. No large vessel  occlusion. Electronically Signed   By: Franchot Gallo M.D.   On: 11/19/2021 18:26   CT Renal Stone Study  Result Date: 11/19/2021 CLINICAL DATA:  Flank pain.  Rule out kidney stone EXAM: CT ABDOMEN AND PELVIS WITHOUT CONTRAST TECHNIQUE: Multidetector CT imaging of the abdomen and pelvis was performed following the standard protocol without IV contrast. RADIATION DOSE REDUCTION: This exam was performed according to the departmental dose-optimization program which includes automated exposure control, adjustment of the mA and/or kV according to patient size and/or use of iterative reconstruction technique. COMPARISON:  Abdominal sonogram 07/20/2017 FINDINGS: Lower chest: No acute abnormality. No focal liver abnormality identified. Gallbladder appears normal. No bile duct dilatation. Hepatobiliary: No focal liver abnormality is seen. No gallstones, gallbladder wall thickening, or biliary dilatation. Pancreas: Unremarkable. No pancreatic ductal dilatation or surrounding inflammatory changes. Spleen: Normal in size without focal abnormality. Adrenals/Urinary Tract: Normal adrenal glands. There are 2 small foci of gas identified within the upper pole collecting system of the left kidney, image 27/2 and image 29/2. No kidney stone or mass noted bilaterally. 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 8 mm calcified right renal artery aneurysm noted. No nephrolithiasis or hydronephrosis identified bilaterally. Mild bilateral renal cortical thinning. No hydroureter or ureteral lithiasis identified. There is diffuse bladder wall thickening with mild surrounding soft tissue haziness. Gas is noted within the wall of the urinary bladder. Stomach/Bowel: Small hiatal hernia. The appendix is visualized and appears normal. No bowel wall thickening, inflammation or distension. Large desiccated stool ball is identified within the rectum measuring 7.4 x 6.3 cm. Vascular/Lymphatic: Aortic atherosclerosis without  aneurysm. No signs of abdominopelvic adenopathy. Reproductive: Prostate is unremarkable. Other: Peritoneal dialysis catheter is identified with moderate volume of free fluid within the abdomen and pelvis. Musculoskeletal: No acute or significant osseous findings. IMPRESSION: 1. There is diffuse bladder wall thickening with mild surrounding soft tissue haziness. Gas is noted within the wall of the urinary bladder. Findings are concerning for cystitis. Emphysematous cystitis not excluded. Small foci of gas is also noted within the left renal collecting system. 2. No nephrolithiasis or hydronephrosis identified bilaterally. 3. Large desiccated stool ball identified within the rectum. Correlate for any clinical signs or symptoms of fecal impaction. 4. Bilateral calcified renal artery aneurysms. 5. Dialysis catheter in place with moderate free fluid in the abdomen pelvis likely reflecting dialysate. 6. Aortic Atherosclerosis (ICD10-I70.0). Critical Value/emergent results were called by telephone at the time of interpretation on 11/19/2021 at 6:01 pm to provider Fredia Sorrow , who verbally acknowledged these results. Electronically Signed   By: Kerby Moors M.D.   On: 11/19/2021 18:02   DG Chest 1 View  Result Date: 11/19/2021 CLINICAL DATA:  Weakness EXAM: CHEST  1 VIEW COMPARISON:  Chest x-ray dated July 30, 2021 FINDINGS: Unchanged cardiomegaly. Mild bilateral perihilar opacities. No large pleural effusion. No evidence of pneumothorax. IMPRESSION: Mild bilateral perihilar opacities, possibly atelectasis or mild pulmonary edema. Electronically Signed   By: Yetta Glassman M.D.   On: 11/19/2021 18:00    Microbiology: Results for orders placed or performed during the hospital encounter of 12/13/21  Blood culture (routine x 2)     Status: None (Preliminary result)   Collection Time: 12/13/21 11:50 PM   Specimen: BLOOD  Result Value Ref Range Status   Specimen Description BLOOD RIGHT ARM  Final    Special Requests   Final    BOTTLES DRAWN AEROBIC AND ANAEROBIC Blood Culture adequate volume   Culture   Final    NO GROWTH 3 DAYS Performed at [facility], [place] [address] [place], [place] [address]  Report Status PENDING  Incomplete  Blood culture (routine x 2)     Status: None (Preliminary result)   Collection Time: 12/14/21 12:15 AM   Specimen: BLOOD RIGHT HAND  Result Value Ref Range Status   Specimen Description BLOOD RIGHT HAND  Final   Special Requests   Final    BOTTLES DRAWN AEROBIC AND ANAEROBIC Blood Culture adequate volume   Culture   Final    NO GROWTH 3 DAYS Performed at [facility], [address] [place], [place] [address]    Report Status PENDING  Incomplete  Body fluid culture w Gram Stain     Status: None   Collection Time: 12/14/21 12:37 AM   Specimen: Peritoneal Cavity; Peritoneal Fluid  Result Value Ref Range Status   Specimen Description PERITONEAL CAVITY  Final   Special Requests NONE  Final   Gram Stain   Final    CYTOSPIN SMEAR MODERATE WBC PRESENT, PREDOMINANTLY PMN FEW GRAM POSITIVE COCCI Performed at [facility], [place] [address] [place], [place] [address]    Culture RARE STAPHYLOCOCCUS EPIDERMIDIS  Final   Report Status 12/16/2021 FINAL  Final   Organism ID, Bacteria STAPHYLOCOCCUS EPIDERMIDIS  Final      Susceptibility   Staphylococcus epidermidis - MIC*    CIPROFLOXACIN <=0.5 SENSITIVE Sensitive     ERYTHROMYCIN <=0.25 SENSITIVE Sensitive     GENTAMICIN <=0.5 SENSITIVE Sensitive     OXACILLIN <=0.25 SENSITIVE Sensitive     TETRACYCLINE <=1 SENSITIVE Sensitive     VANCOMYCIN <=0.5 SENSITIVE Sensitive     TRIMETH/SULFA <=10 SENSITIVE Sensitive     CLINDAMYCIN <=0.25 SENSITIVE Sensitive     RIFAMPIN <=0.5 SENSITIVE Sensitive     Inducible Clindamycin NEGATIVE Sensitive     * RARE STAPHYLOCOCCUS EPIDERMIDIS  Urine Culture     Status: Abnormal   Collection Time: 12/14/21  4:33 AM   Specimen: In/Out Cath Urine   Result Value Ref Range Status   Specimen Description IN/OUT CATH URINE  Final   Special Requests   Final    NONE Performed at [facility], [place] [address], [place], [place] [address]    Culture 30,000 COLONIES/mL ESCHERICHIA COLI (A)  Final   Report Status 12/16/2021 FINAL  Final   Organism ID, Bacteria ESCHERICHIA COLI (A)  Final      Susceptibility   Escherichia coli - MIC*    AMPICILLIN <=2 SENSITIVE Sensitive     CEFAZOLIN <=4 SENSITIVE Sensitive     CEFEPIME <=0.12 SENSITIVE Sensitive     CEFTRIAXONE <=0.25 SENSITIVE Sensitive     CIPROFLOXACIN <=0.25 SENSITIVE Sensitive     GENTAMICIN <=1 SENSITIVE Sensitive     IMIPENEM <=0.25 SENSITIVE Sensitive     NITROFURANTOIN <=16 SENSITIVE Sensitive     TRIMETH/SULFA <=20 SENSITIVE Sensitive     AMPICILLIN/SULBACTAM <=2 SENSITIVE Sensitive     PIP/TAZO <=4 SENSITIVE Sensitive     * 30,000 COLONIES/mL ESCHERICHIA COLI    Labs: CBC: Recent Labs  Lab 12/13/21 2350 12/14/21 0558 12/15/21 0410 12/16/21 0418 12/17/21 0259  WBC 14.6* 9.9 8.2 7.8 8.7  NEUTROABS 12.9* 7.6  --   --   --   HGB 15.0 13.4 11.1* 10.9* 11.3*  HCT 41.1 37.1* 30.0* 30.9* 32.0*  MCV 95.4 95.9 94.3 97.5 97.0  PLT 198 173 139* 150 749   Basic Metabolic Panel: Recent Labs  Lab 12/13/21 2350 12/14/21 0558 12/15/21 0410 12/15/21 0840 12/16/21 0418 12/17/21 0259  NA 134* 135 134*  --  137 139  K 2.7* 3.1* 2.4* 3.1* 3.0* 3.4*  CL 92* 96* 97*  --  99 103  CO2 '26 25 23  '$ --  27 26  GLUCOSE 187* 125* 148*  --  124* 94  BUN 40* 42* 43*  --  41* 39*  CREATININE 6.74* 6.53* 6.28*  --  5.83* 5.57*  CALCIUM 9.8 8.9 8.8*  --  9.0 9.1  MG  --  1.6*  --   --   --   --   PHOS  --  3.1  --   --  2.7  --    Liver Function Tests: Recent Labs  Lab 12/13/21 2350 12/14/21 0558 12/15/21 0410 12/16/21 0418  AST 54* 56* 31  --   ALT '21 18 16  '$ --   ALKPHOS 85 69 60  --   BILITOT 0.6 0.8 0.6  --   PROT 7.7 6.4* 5.4*  --   ALBUMIN 3.3* 2.7* 2.2* 2.3*    CBG: No results for input(s): "GLUCAP" in the last 168 hours.  Discharge time spent: {LESS THAN/GREATER JHER:74081} 30 minutes.  Signed: Cordelia Poche, MD [facility] 12/17/2021

## 2021-12-17 NOTE — Plan of Care (Signed)
  Problem: Education: Goal: Knowledge of General Education information will improve Description: Including pain rating scale, medication(s)/side effects and non-pharmacologic comfort measures Outcome: Adequate for Discharge   

## 2021-12-17 NOTE — TOC Transition Note (Addendum)
Transition of Care [facility]) - CM/SW Discharge Note   Patient Details  Name: [name] MRN: [id number] Date of Birth: 18-Jan-1946  [facility] CM/SW Contact:  Zenon Mayo, RN Phone Number: 12/17/2021, 12:51 PM   Clinical Narrative:    Patient is for dc today, wife would like for patient to get a bolus before he goes.  NCM asked Staff RN to let know when to call [facility]  for transport.  NCM notified Caryl Pina of dc today. Wife would like outpatient services with [facility]. NCM made referral To [place] with [facility] scheduled for 3 pm transport.   Final next level of care: [place] Barriers to Discharge: Continued Medical Work up   Patient Goals and CMS Choice Patient states their goals for this hospitalization and ongoing recovery are:: return home with wife CMS Medicare.gov Compare Post Acute Care list provided to:: Patient Represenative (must comment) Choice offered to / list presented to : Spouse  Discharge Placement                       Discharge Plan and Services In-house Referral: NA Discharge Planning Services: CM Consult Post Acute Care Choice: Home Health            DME Agency: NA       HH Arranged: PT, OT, Speech Therapy [place] Agency: [place] ([facility]) Date HH Agency Contacted: 12/15/21 Time [place]: [place] Representative spoke with at [place]: [place] ([place]) Interventions     Readmission Risk Interventions    12/15/2021    4:03 PM  Readmission Risk Prevention Plan  Transportation Screening Complete  PCP or Specialist Appt within 3-5 Days Complete  HRI or [place] Complete  Social Work Consult for [place] Planning/Counseling Complete  Palliative Care Screening Not Applicable  Medication Review Press photographer) Complete

## 2021-12-17 NOTE — Progress Notes (Signed)
Went over discharge instructions with patient's wife at the bedside. Patient's wife made aware of follow up appointments and medication changes. Patient's wife verbalize understanding. 250cc bolus completed. NT removed PIVs, patient tolerated well. Site is clean dry and intact. Tele monitor removed and CCMD made aware. [facility] has been called.

## 2021-12-17 NOTE — Progress Notes (Signed)
Physical Therapy Treatment Patient Details Name: [name] MRN: [id number] DOB: January 28, 1946 Today's Date: 12/17/2021   History of Present Illness Patient is 76 y/o male who presented to ED from home 12/13/21 for abdominal pain and dyalysate fluid.  PMHx: ESRD on peritoneal dialysis, anemia of chronic disease, aortic stenosis, moderate MS and moderate TR, HTN, hyperlipidemia, hypthyroidism, hx PE, chronic constipation, anxiety, depression., CVA (May Rt frontal and bil cerebellar)    PT Comments    Pt supine in bed on arrival this session.  Pt required encouragement from family to participate in PT session this am.  Performed sit to stand x 2 this session.  Pt then performed seated UE/LE exercises.  Limited ROM to L shoulder noted.     Recommendations for follow up therapy are one component of a multi-disciplinary discharge planning process, led by the attending physician.  Recommendations may be updated based on patient status, additional functional criteria and insurance authorization.  Follow Up Recommendations  Acute inpatient rehab (3hours/day)     Assistance Recommended at Discharge Frequent or constant Supervision/Assistance  Patient can return home with the following A lot of help with walking and/or transfers;A lot of help with bathing/dressing/bathroom;Assistance with cooking/housework;Assistance with feeding;Direct supervision/assist for medications management;Direct supervision/assist for financial management;Assist for transportation;Help with stairs or ramp for entrance   Equipment Recommendations  Other (comment) (stedy)    Recommendations for Other Services       Precautions / Restrictions Precautions Precautions: Fall Precaution Comments: ataxia bilaterally Restrictions Weight Bearing Restrictions: No     Mobility  Bed Mobility Overal bed mobility: Needs Assistance Bed Mobility: Supine to Sit     Supine to sit: Min assist     General bed mobility comments:  MIn assistance bring LEs to edge of bed and starting to engage core to come to sitting.  He reports he needed help to complete transfer.  Min assistance to for trunk control with max cues to scoot in sitting.    Transfers Overall transfer level: Needs assistance Equipment used: None (sara stedy) Transfers: Sit to/from Stand Sit to Stand: Mod assist           General transfer comment: Cues for foot and hand placement to rise into standing.  Pt with max cues for hip and trunk extension to rise into standing.  Performed x 2 reps this session. Transfer via Lift Equipment: Stedy (sara stedy)  Ambulation/Gait                   Stairs             Wheelchair Mobility    Modified Rankin (Stroke Patients Only)       Balance Overall balance assessment: Needs assistance Sitting-balance support: Bilateral upper extremity supported Sitting balance-Leahy Scale: Poor Sitting balance - Comments: Pt able to maintain balance at EOB when Bil UE are supporting. Postural control: Left lateral lean Standing balance support: Bilateral upper extremity supported, Reliant on assistive device for balance Standing balance-Leahy Scale: Poor                              Cognition Arousal/Alertness: Awake/alert Behavior During Therapy: WFL for tasks assessed/performed Overall Cognitive Status: Impaired/Different from baseline Area of Impairment: Attention, Orientation, Memory, Following commands, Safety/judgement, Problem solving, Awareness                 Orientation Level: Disoriented to, Time, Situation Current Attention Level: Sustained Memory: Decreased recall  of precautions, Decreased short-term memory Following Commands: Follows multi-step commands with increased time, Follows multi-step commands inconsistently, Follows one step commands consistently Safety/Judgement: Decreased awareness of safety, Decreased awareness of deficits Awareness:  Intellectual Problem Solving: Slow processing, Decreased initiation, Difficulty sequencing, Requires tactile cues, Requires verbal cues General Comments: Pt relied on VC's spoken repeatedly throughout session for hand placement and direction of weight shift. Pt reports feeling straight when he's leaning, in sitting.        Exercises General Exercises - Lower Extremity Long Arc Quad: AROM, Both, 10 reps, Seated Hip Flexion/Marching: AROM, Both, 10 reps, Seated Other Exercises Other Exercises: B UE shoulder flexion. scap retraction, and shoulder elevation 1x 10 reps.  Poor mobility on L shoulder.    General Comments        Pertinent Vitals/Pain Pain Assessment Pain Assessment: No/denies pain    Home Living                          Prior Function            PT Goals (current goals can now be found in the care plan section) Acute Rehab PT Goals Patient Stated Goal: To go home Potential to Achieve Goals: Good Progress towards PT goals: Progressing toward goals    Frequency    Min 3X/week      PT Plan Current plan remains appropriate    Co-evaluation              AM-PAC PT "6 Clicks" Mobility   Outcome Measure  Help needed turning from your back to your side while in a flat bed without using bedrails?: A Lot Help needed moving from lying on your back to sitting on the side of a flat bed without using bedrails?: A Lot Help needed moving to and from a bed to a chair (including a wheelchair)?: Total Help needed standing up from a chair using your arms (e.g., wheelchair or bedside chair)?: A Lot Help needed to walk in hospital room?: Total Help needed climbing 3-5 steps with a railing? : Total 6 Click Score: 9    End of Session Equipment Utilized During Treatment: Gait belt Activity Tolerance: Patient tolerated treatment well Patient left: in chair;with call bell/phone within reach;with chair alarm set;with family/visitor present Nurse Communication:  Mobility status;Need for lift equipment (use of stedy for back to bed.) PT Visit Diagnosis: Unsteadiness on feet (R26.81);Other abnormalities of gait and mobility (R26.89);Muscle weakness (generalized) (M62.81)     Time: 1224-8250 PT Time Calculation (min) (ACUTE ONLY): 20 min  Charges:  $Therapeutic Activity: 8-22 mins                     Erasmo Leventhal , PTA Acute Rehabilitation Services  Office [phone]    Kamesha Herne Eli Hose 12/17/2021, 12:53 PM

## 2021-12-18 LAB — PATHOLOGIST SMEAR REVIEW: Path Review: NEGATIVE

## 2021-12-19 LAB — CULTURE, BLOOD (ROUTINE X 2)
Culture: NO GROWTH
Culture: NO GROWTH
Special Requests: ADEQUATE
Special Requests: ADEQUATE

## 2021-12-22 ENCOUNTER — Ambulatory Visit: Payer: Managed Care, Other (non HMO) | Admitting: Physician Assistant

## 2021-12-24 ENCOUNTER — Telehealth: Payer: Self-pay | Admitting: Cardiology

## 2021-12-24 NOTE — Telephone Encounter (Signed)
Patient c/o Palpitations:  High priority if patient c/o lightheadedness, shortness of breath, or chest pain  How long have you had palpitations/irregular HR/ Afib? Are you having the symptoms now? Not sure, She states she doesn't know when he has it and when he doesn't  Are you currently experiencing lightheadedness, SOB or CP? no  Do you have a history of afib (atrial fibrillation) or irregular heart rhythm? yes  Have you checked your BP or HR? (document readings if available):  [phone]   Are you experiencing any other symptoms? Pt spouse states that he is unable to keep any food down. She states that anything he eats, he just throws it up. She denies that pt has had any lightheadedness, sob, or cp. Please advise.

## 2021-12-25 ENCOUNTER — Telehealth: Payer: Self-pay | Admitting: Gastroenterology

## 2021-12-25 NOTE — Telephone Encounter (Signed)
Spoke with wife who states that pt is unable to keep food down. She states that she feels it may be d/t to his afib. She says that she does not know when he is in afib. Informed wife that she would need to call PCP. She states that she called and they are unable to see pt until next week. Pt is bed bound and wife is unable to do in person visit. She is asking if there is something she can give him prior to feeding him to prevent vomiting. Please advise.

## 2021-12-25 NOTE — Telephone Encounter (Signed)
Inbound call from patients spouse stating patient is experiencing weightless. Patient is currently on a puree diet . Patient is vomiting a yellowish liquid. Patient is also bed bound. Please give patients spouse a call back in regards to scheduling a televise.  Thank you

## 2021-12-25 NOTE — Telephone Encounter (Signed)
Pt's wife [name] notified of Dr. Myles Gip note. Wife voiced understanding.

## 2021-12-26 NOTE — Telephone Encounter (Signed)
Returned call to patient's wife, [name]. I informed [name] that we have tentatively scheduled the patient for a MyChart video visit with Dr. Loletha Carrow on 12/31/21 at 4 pm. I told [name] that I will have to check with Dr. Loletha Carrow to confirm that this appt is OK. [name] is aware that Dr. Loletha Carrow returns to the office on 12/31/21. I told [name] that if we need to reschedule the appt we will give her a call. [name] verbalized understanding and had no concerns at the end of the call.

## 2021-12-31 ENCOUNTER — Telehealth (INDEPENDENT_AMBULATORY_CARE_PROVIDER_SITE_OTHER): Payer: Managed Care, Other (non HMO) | Admitting: Gastroenterology

## 2021-12-31 ENCOUNTER — Encounter: Payer: Self-pay | Admitting: Gastroenterology

## 2021-12-31 VITALS — Ht 68.0 in | Wt 225.0 lb

## 2021-12-31 DIAGNOSIS — R1312 Dysphagia, oropharyngeal phase: Secondary | ICD-10-CM | POA: Diagnosis not present

## 2021-12-31 DIAGNOSIS — R112 Nausea with vomiting, unspecified: Secondary | ICD-10-CM

## 2021-12-31 DIAGNOSIS — I25119 Atherosclerotic heart disease of native coronary artery with unspecified angina pectoris: Secondary | ICD-10-CM

## 2021-12-31 NOTE — Telephone Encounter (Signed)
I have a full clinic schedule today after being out of the office last week and may be running behind.  As such, I may not be able to accommodate a telemedicine appointment this afternoon.  Please move it to 430 and I will do my best.  - HD

## 2021-12-31 NOTE — Progress Notes (Signed)
This Nelson contacted our office requesting a physician telemedicine consultation regarding clinical questions and/or test results. Due to this Nelson's serious condition and immobility this was felt to be [place] most appropriate method of Nelson evaluation.   Participants on [place] conference : myself, [name] and his wife  [name] consented to this consultation and was aware that a charge will be placed through their insurance.  They were also made aware of [place] limitations of telemedicine.  I was in my office and [name] was at home.   Encounter time:  Total time 30 minutes, with 20 minutes spent with Nelson on Caregility   _____________________________________________________________________________________________            [facility] Progress Note  Chief Complaint: Dysphagia, constipation, weight loss  Subjective  History: I recently saw Mr. Campion in [place] office along with his daughter.  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  He is on peritoneal dialysis, and was since admitted for peritonitis treated with antibiotics.  (Admission H&P, discharge summary and additional data reviewed) His wife contacted our office last week while I was away and had concerns about his ongoing dysphagia with weight loss and set that Cordell had become bedbound.  He was scheduled for this telemedicine appointment.  Taurus's wife is very concerned about him, says he has taken very little nutrition with [place] last week.  They are lucky to get 2 cupfuls of pured fluid into him.  He has nausea and intermittent gagging of clear fluid or mucus.  He is now bedbound and weak.  With recent hospital discharge he was supposed to be evaluated by palliative care, and [place] first agency said they could not see him for a month.  [place] second agency apparently said they would see him within 5 days  but that has not occurred.  His wife is frustrated, unable to manage all of this and still trying to work as well.  ROS: Cardiovascular:  no chest pain Respiratory: no dyspnea  [name]'s Past Medical, Family and Social History were reviewed and are on file in [place] EMR.  Objective:  Med list reviewed  Current Outpatient Medications:    albuterol (PROVENTIL HFA;VENTOLIN HFA) 108 (90 BASE) MCG/ACT inhaler, Inhale 2 puffs into Austin lungs every 6 (six) hours as needed for wheezing or shortness of breath. , Disp: , Rfl:    ALPRAZolam (XANAX) 0.25 MG tablet, Take 0.25 mg by mouth at bedtime as needed for anxiety., Disp: , Rfl:    aspirin EC 81 MG tablet, Take 81 mg by mouth daily. Swallow whole., Disp: , Rfl:    collagenase (SANTYL) 250 UNIT/GM ointment, Apply 1 application. topically daily as needed (sore)., Disp: , Rfl:    cyanocobalamin (,VITAMIN B-12,) 1000 MCG/ML injection, cyanocobalamin (vit B-12) 1,000 mcg/mL injection solution, Disp: , Rfl:    docusate sodium (COLACE) 100 MG capsule, Take 200 mg by mouth 2 (two) times daily as needed for mild constipation., Disp: , Rfl:    finasteride (PROSCAR) 5 MG tablet, Take 5 mg by mouth daily., Disp: , Rfl:    fluticasone (FLONASE) 50 MCG/ACT nasal spray, Place 1 spray into both nostrils daily as needed for allergies., Disp: , Rfl:    L-Lysine 1000 MG TABS, Take 1 tablet by mouth daily., Disp: , Rfl:    levothyroxine (SYNTHROID) 175 MCG tablet, Take 175 mcg by mouth daily before breakfast. , Disp: , Rfl:  pantoprazole (PROTONIX) 40 MG tablet, Take 40 mg by mouth daily., Disp: , Rfl:    polyethylene glycol (MIRALAX / GLYCOLAX) 17 g packet, Take 17 g by mouth daily. (Nelson taking differently: Take 17 g by mouth daily as needed for mild constipation or moderate constipation.), Disp: 14 each, Rfl: 0   potassium chloride (KLOR-CON) 10 MEQ tablet, Take 10 mEq by mouth daily., Disp: , Rfl:    sertraline (ZOLOFT) 50 MG tablet, Take 50 mg by mouth  daily., Disp: , Rfl:    simvastatin (ZOCOR) 40 MG tablet, Take 1 tablet (40 mg total) by mouth every evening., Disp: 30 tablet, Rfl: 6   spironolactone (ALDACTONE) 25 MG tablet, Take 25 mg by mouth daily., Disp: , Rfl:    torsemide (DEMADEX) 100 MG tablet, Take 1 tablet by mouth 2 (two) times daily., Disp: , Rfl:    TRINTELLIX 20 MG TABS tablet, Take 20 mg by mouth daily., Disp: , Rfl:    Vital signs in last 24 hrs: There were no vitals filed for this visit. Wt Readings from Last 3 Encounters:  12/31/21 225 lb (102.1 kg)  12/17/21 224 lb 10.4 oz (101.9 kg)  12/02/21 225 lb (102.1 kg)    Physical Exam  Tadashi was on camera some of [place] time and was gagging bringing up clear fluid.  He looks weak and is in bed  Labs:     Latest Ref Rng & Units 12/17/2021    2:59 AM 12/16/2021    4:18 AM 12/15/2021    4:10 AM  CBC  WBC 4.0 - 10.5 K/uL 8.7  7.8  8.2   Hemoglobin 13.0 - 17.0 g/dL 11.3  10.9  11.1   Hematocrit 39.0 - 52.0 % 32.0  30.9  30.0   Platelets 150 - 400 K/uL 158  150  139       Latest Ref Rng & Units 12/17/2021    2:59 AM 12/16/2021    4:18 AM 12/15/2021    8:40 AM  CMP  Glucose 70 - 99 mg/dL 94  124    BUN 8 - 23 mg/dL 39  41    Creatinine 0.61 - 1.24 mg/dL 5.57  5.83    Sodium 135 - 145 mmol/L 139  137    Potassium 3.5 - 5.1 mmol/L 3.4  3.0  3.1   Chloride 98 - 111 mmol/L 103  99    CO2 22 - 32 mmol/L 26  27    Calcium 8.9 - 10.3 mg/dL 9.1  9.0       ___________________________________________ Radiologic studies:  He had another modified barium study after SLP evaluation during recent admission continues to show oropharyngeal dysphagia with aspiration.  (Report reviewed) ____________________________________________ Other:   _____________________________________________ Assessment & Plan  Assessment: Encounter Diagnoses  Name Primary?   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  He has nausea and vomiting that is likely multifactorial, and with his recent peritonitis and ongoing dialysis, I am concerned he could be uremic and/or have volume electrolyte derangement.  His wife is understandably frustrated, have not heard back from [place] primary care physician last evening, and has been unable to get his palliative care evaluation.  Home health agency has been doing some rehab with him but his medical condition is worsening.  Plan: I recommended that she bring him to [facility] hospital today as he will likely need admission,  and in that he can get better care coordination for his palliative care needs. He could be seen by our inpatient service should he require GI evaluation.  Nelida Meuse III

## 2021-12-31 NOTE — Telephone Encounter (Signed)
Called and spoke with patient's wife. She is aware that we have moved patient's appt to 4:30 pm today. I advised that Dr. Loletha Carrow is going to try his best to be on time for that appt. [name] verbalized understanding and had no concerns at the end of the call.

## 2022-01-01 ENCOUNTER — Other Ambulatory Visit: Payer: Self-pay

## 2022-01-01 ENCOUNTER — Inpatient Hospital Stay (HOSPITAL_COMMUNITY)
Admission: EM | Admit: 2022-01-01 | Discharge: 2022-01-05 | DRG: 640 | Disposition: A | Discharge status: Home Health | Payer: Managed Care, Other (non HMO) | Attending: Internal Medicine | Admitting: Internal Medicine

## 2022-01-01 ENCOUNTER — Encounter (HOSPITAL_COMMUNITY): Payer: Self-pay

## 2022-01-01 DIAGNOSIS — E860 Dehydration: Secondary | ICD-10-CM | POA: Diagnosis not present

## 2022-01-01 DIAGNOSIS — Z79899 Other long term (current) drug therapy: Secondary | ICD-10-CM

## 2022-01-01 DIAGNOSIS — I69391 Dysphagia following cerebral infarction: Secondary | ICD-10-CM

## 2022-01-01 DIAGNOSIS — R627 Adult failure to thrive: Secondary | ICD-10-CM | POA: Diagnosis present

## 2022-01-01 DIAGNOSIS — N186 End stage renal disease: Secondary | ICD-10-CM | POA: Diagnosis present

## 2022-01-01 DIAGNOSIS — F32A Depression, unspecified: Secondary | ICD-10-CM | POA: Diagnosis not present

## 2022-01-01 DIAGNOSIS — R1312 Dysphagia, oropharyngeal phase: Secondary | ICD-10-CM | POA: Diagnosis not present

## 2022-01-01 DIAGNOSIS — Z8249 Family history of ischemic heart disease and other diseases of the circulatory system: Secondary | ICD-10-CM

## 2022-01-01 DIAGNOSIS — I480 Paroxysmal atrial fibrillation: Secondary | ICD-10-CM | POA: Diagnosis present

## 2022-01-01 DIAGNOSIS — Z951 Presence of aortocoronary bypass graft: Secondary | ICD-10-CM

## 2022-01-01 DIAGNOSIS — F419 Anxiety disorder, unspecified: Secondary | ICD-10-CM | POA: Diagnosis present

## 2022-01-01 DIAGNOSIS — Z8673 Personal history of transient ischemic attack (TIA), and cerebral infarction without residual deficits: Secondary | ICD-10-CM

## 2022-01-01 DIAGNOSIS — J449 Chronic obstructive pulmonary disease, unspecified: Secondary | ICD-10-CM | POA: Diagnosis present

## 2022-01-01 DIAGNOSIS — E872 Acidosis, unspecified: Secondary | ICD-10-CM | POA: Diagnosis not present

## 2022-01-01 DIAGNOSIS — R634 Abnormal weight loss: Secondary | ICD-10-CM | POA: Diagnosis present

## 2022-01-01 DIAGNOSIS — Z79890 Hormone replacement therapy (postmenopausal): Secondary | ICD-10-CM

## 2022-01-01 DIAGNOSIS — E669 Obesity, unspecified: Secondary | ICD-10-CM | POA: Diagnosis not present

## 2022-01-01 DIAGNOSIS — R1310 Dysphagia, unspecified: Secondary | ICD-10-CM

## 2022-01-01 DIAGNOSIS — Z953 Presence of xenogenic heart valve: Secondary | ICD-10-CM | POA: Diagnosis not present

## 2022-01-01 DIAGNOSIS — Z66 Do not resuscitate: Secondary | ICD-10-CM | POA: Diagnosis not present

## 2022-01-01 DIAGNOSIS — Z6831 Body mass index (BMI) 31.0-31.9, adult: Secondary | ICD-10-CM

## 2022-01-01 DIAGNOSIS — Z515 Encounter for palliative care: Secondary | ICD-10-CM | POA: Diagnosis not present

## 2022-01-01 DIAGNOSIS — L89312 Pressure ulcer of right buttock, stage 2: Secondary | ICD-10-CM | POA: Diagnosis present

## 2022-01-01 DIAGNOSIS — Z538 Procedure and treatment not carried out for other reasons: Secondary | ICD-10-CM | POA: Diagnosis not present

## 2022-01-01 DIAGNOSIS — N2581 Secondary hyperparathyroidism of renal origin: Secondary | ICD-10-CM | POA: Diagnosis not present

## 2022-01-01 DIAGNOSIS — E440 Moderate protein-calorie malnutrition: Secondary | ICD-10-CM | POA: Diagnosis present

## 2022-01-01 DIAGNOSIS — I2510 Atherosclerotic heart disease of native coronary artery without angina pectoris: Secondary | ICD-10-CM | POA: Diagnosis not present

## 2022-01-01 DIAGNOSIS — I120 Hypertensive chronic kidney disease with stage 5 chronic kidney disease or end stage renal disease: Secondary | ICD-10-CM | POA: Diagnosis not present

## 2022-01-01 DIAGNOSIS — Z992 Dependence on renal dialysis: Secondary | ICD-10-CM | POA: Diagnosis not present

## 2022-01-01 DIAGNOSIS — R532 Functional quadriplegia: Secondary | ICD-10-CM | POA: Diagnosis not present

## 2022-01-01 DIAGNOSIS — R112 Nausea with vomiting, unspecified: Secondary | ICD-10-CM | POA: Diagnosis not present

## 2022-01-01 DIAGNOSIS — Z952 Presence of prosthetic heart valve: Secondary | ICD-10-CM

## 2022-01-01 DIAGNOSIS — Z87891 Personal history of nicotine dependence: Secondary | ICD-10-CM

## 2022-01-01 DIAGNOSIS — R1115 Cyclical vomiting syndrome unrelated to migraine: Secondary | ICD-10-CM | POA: Diagnosis not present

## 2022-01-01 DIAGNOSIS — D649 Anemia, unspecified: Secondary | ICD-10-CM | POA: Diagnosis not present

## 2022-01-01 DIAGNOSIS — E782 Mixed hyperlipidemia: Secondary | ICD-10-CM | POA: Diagnosis not present

## 2022-01-01 DIAGNOSIS — K5641 Fecal impaction: Secondary | ICD-10-CM | POA: Diagnosis present

## 2022-01-01 DIAGNOSIS — Z86711 Personal history of pulmonary embolism: Secondary | ICD-10-CM

## 2022-01-01 DIAGNOSIS — M109 Gout, unspecified: Secondary | ICD-10-CM | POA: Diagnosis present

## 2022-01-01 DIAGNOSIS — E785 Hyperlipidemia, unspecified: Secondary | ICD-10-CM | POA: Diagnosis present

## 2022-01-01 DIAGNOSIS — I10 Essential (primary) hypertension: Secondary | ICD-10-CM | POA: Diagnosis present

## 2022-01-01 DIAGNOSIS — Z7401 Bed confinement status: Secondary | ICD-10-CM

## 2022-01-01 DIAGNOSIS — Z7982 Long term (current) use of aspirin: Secondary | ICD-10-CM

## 2022-01-01 DIAGNOSIS — E039 Hypothyroidism, unspecified: Secondary | ICD-10-CM | POA: Diagnosis present

## 2022-01-01 LAB — CBC
HCT: 40.4 % (ref 39.0–52.0)
Hemoglobin: 13.9 g/dL (ref 13.0–17.0)
MCH: 33.9 pg (ref 26.0–34.0)
MCHC: 34.4 g/dL (ref 30.0–36.0)
MCV: 98.5 fL (ref 80.0–100.0)
Platelets: 255 10*3/uL (ref 150–400)
RBC: 4.1 MIL/uL — ABNORMAL LOW (ref 4.22–5.81)
RDW: 13.6 % (ref 11.5–15.5)
WBC: 9 10*3/uL (ref 4.0–10.5)
nRBC: 0 % (ref 0.0–0.2)

## 2022-01-01 LAB — CBC WITH DIFFERENTIAL/PLATELET
Abs Immature Granulocytes: 0.14 10*3/uL — ABNORMAL HIGH (ref 0.00–0.07)
Basophils Absolute: 0.1 10*3/uL (ref 0.0–0.1)
Basophils Relative: 1 %
Eosinophils Absolute: 0.1 10*3/uL (ref 0.0–0.5)
Eosinophils Relative: 1 %
HCT: 40.4 % (ref 39.0–52.0)
Hemoglobin: 14 g/dL (ref 13.0–17.0)
Immature Granulocytes: 2 %
Lymphocytes Relative: 25 %
Lymphs Abs: 2.2 10*3/uL (ref 0.7–4.0)
MCH: 34 pg (ref 26.0–34.0)
MCHC: 34.7 g/dL (ref 30.0–36.0)
MCV: 98.1 fL (ref 80.0–100.0)
Monocytes Absolute: 0.7 10*3/uL (ref 0.1–1.0)
Monocytes Relative: 8 %
Neutro Abs: 5.8 10*3/uL (ref 1.7–7.7)
Neutrophils Relative %: 63 %
Platelets: 243 10*3/uL (ref 150–400)
RBC: 4.12 MIL/uL — ABNORMAL LOW (ref 4.22–5.81)
RDW: 13.4 % (ref 11.5–15.5)
WBC: 9 10*3/uL (ref 4.0–10.5)
nRBC: 0 % (ref 0.0–0.2)

## 2022-01-01 LAB — COMPREHENSIVE METABOLIC PANEL
ALT: 9 U/L (ref 0–44)
AST: 21 U/L (ref 15–41)
Albumin: 3.2 g/dL — ABNORMAL LOW (ref 3.5–5.0)
Alkaline Phosphatase: 87 U/L (ref 38–126)
Anion gap: 12 (ref 5–15)
BUN: 36 mg/dL — ABNORMAL HIGH (ref 8–23)
CO2: 26 mmol/L (ref 22–32)
Calcium: 9.6 mg/dL (ref 8.9–10.3)
Chloride: 95 mmol/L — ABNORMAL LOW (ref 98–111)
Creatinine, Ser: 6.82 mg/dL — ABNORMAL HIGH (ref 0.61–1.24)
GFR, Estimated: 8 mL/min — ABNORMAL LOW (ref >60)
Glucose, Bld: 130 mg/dL — ABNORMAL HIGH (ref 70–99)
Potassium: 3.5 mmol/L (ref 3.5–5.1)
Sodium: 133 mmol/L — ABNORMAL LOW (ref 135–145)
Total Bilirubin: 0.6 mg/dL (ref 0.3–1.2)
Total Protein: 7.2 g/dL (ref 6.5–8.1)

## 2022-01-01 LAB — CREATININE, SERUM
Creatinine, Ser: 7.1 mg/dL — ABNORMAL HIGH (ref 0.61–1.24)
GFR, Estimated: 7 mL/min — ABNORMAL LOW (ref >60)

## 2022-01-01 LAB — LACTIC ACID, PLASMA
Lactic Acid, Venous: 2.7 mmol/L (ref 0.5–1.9)
Lactic Acid, Venous: 2.3 mmol/L (ref 0.5–1.9)

## 2022-01-01 MED ORDER — PANTOPRAZOLE SODIUM 40 MG PO TBEC
40.0000 mg | DELAYED_RELEASE_TABLET | Freq: Every day | ORAL | Status: DC
  Administered 2022-01-02 – 2022-01-05 (×4): 40 mg via ORAL
  Filled 2022-01-01 (×4): qty 1

## 2022-01-01 MED ORDER — SIMVASTATIN 20 MG PO TABS
40.0000 mg | ORAL_TABLET | Freq: Every evening | ORAL | Status: DC
  Administered 2022-01-01 – 2022-01-05 (×5): 40 mg via ORAL
  Filled 2022-01-01 (×3): qty 2
  Filled 2022-01-01: qty 4
  Filled 2022-01-01: qty 2

## 2022-01-01 MED ORDER — SODIUM CHLORIDE 0.9 % IV BOLUS
500.0000 mL | Freq: Once | INTRAVENOUS | Status: AC
Start: 2022-01-01 — End: 2022-01-01
  Administered 2022-01-01: 500 mL via INTRAVENOUS

## 2022-01-01 MED ORDER — ONDANSETRON HCL 4 MG/2ML IJ SOLN
4.0000 mg | Freq: Four times a day (QID) | INTRAMUSCULAR | Status: DC | PRN
  Administered 2022-01-01 – 2022-01-02 (×2): 4 mg via INTRAVENOUS
  Filled 2022-01-01 (×2): qty 2

## 2022-01-01 MED ORDER — VORTIOXETINE HBR 20 MG PO TABS
20.0000 mg | ORAL_TABLET | Freq: Every day | ORAL | Status: DC
  Administered 2022-01-02 – 2022-01-04 (×3): 20 mg via ORAL
  Filled 2022-01-01 (×5): qty 1

## 2022-01-01 MED ORDER — ALBUTEROL SULFATE (2.5 MG/3ML) 0.083% IN NEBU: 3.0000 mL | INHALATION_SOLUTION | Freq: Four times a day (QID) | RESPIRATORY_TRACT | Status: DC | PRN

## 2022-01-01 MED ORDER — LEVOTHYROXINE SODIUM 75 MCG PO TABS
175.0000 ug | ORAL_TABLET | Freq: Every day | ORAL | Status: DC
  Administered 2022-01-02 – 2022-01-05 (×4): 175 ug via ORAL
  Filled 2022-01-01 (×5): qty 1

## 2022-01-01 MED ORDER — ALPRAZOLAM 0.25 MG PO TABS
0.2500 mg | ORAL_TABLET | Freq: Every evening | ORAL | Status: DC | PRN
Start: 2022-01-01 — End: 2022-01-06
  Administered 2022-01-02: 0.25 mg via ORAL
  Filled 2022-01-01: qty 1

## 2022-01-01 MED ORDER — POTASSIUM CHLORIDE CRYS ER 10 MEQ PO TBCR
10.0000 meq | EXTENDED_RELEASE_TABLET | Freq: Every day | ORAL | Status: DC
  Administered 2022-01-02 – 2022-01-05 (×4): 10 meq via ORAL
  Filled 2022-01-01 (×4): qty 1

## 2022-01-01 MED ORDER — DOCUSATE SODIUM 100 MG PO CAPS: 200.0000 mg | ORAL_CAPSULE | Freq: Two times a day (BID) | ORAL | Status: DC | PRN

## 2022-01-01 MED ORDER — FINASTERIDE 5 MG PO TABS
5.0000 mg | ORAL_TABLET | Freq: Every day | ORAL | Status: DC
  Administered 2022-01-02 – 2022-01-05 (×4): 5 mg via ORAL
  Filled 2022-01-01 (×4): qty 1

## 2022-01-01 MED ORDER — SERTRALINE HCL 50 MG PO TABS
50.0000 mg | ORAL_TABLET | Freq: Every day | ORAL | Status: DC
  Administered 2022-01-02 – 2022-01-05 (×4): 50 mg via ORAL
  Filled 2022-01-01 (×4): qty 1

## 2022-01-01 MED ORDER — FLUTICASONE PROPIONATE 50 MCG/ACT NA SUSP
1.0000 | Freq: Every day | NASAL | Status: DC | PRN
Start: 2022-01-01 — End: 2022-01-06

## 2022-01-01 MED ORDER — SODIUM CHLORIDE 0.9 % IV BOLUS
1000.0000 mL | Freq: Once | INTRAVENOUS | Status: AC
  Administered 2022-01-01: 1000 mL via INTRAVENOUS

## 2022-01-01 MED ORDER — ASPIRIN 81 MG PO TBEC
81.0000 mg | DELAYED_RELEASE_TABLET | Freq: Every day | ORAL | Status: DC
  Administered 2022-01-02 – 2022-01-05 (×4): 81 mg via ORAL
  Filled 2022-01-01 (×4): qty 1

## 2022-01-01 MED ORDER — TORSEMIDE 20 MG PO TABS: 100.0000 mg | ORAL_TABLET | Freq: Two times a day (BID) | ORAL | Status: DC

## 2022-01-01 MED ORDER — CYANOCOBALAMIN 1000 MCG/ML IJ SOLN
1000.0000 ug | INTRAMUSCULAR | Status: AC
  Administered 2022-01-02: 1000 ug via INTRAMUSCULAR
  Filled 2022-01-01: qty 1

## 2022-01-01 MED ORDER — HEPARIN SODIUM (PORCINE) 5000 UNIT/ML IJ SOLN
5000.0000 [IU] | Freq: Three times a day (TID) | INTRAMUSCULAR | Status: DC
Start: 2022-01-01 — End: 2022-01-06
  Administered 2022-01-01 – 2022-01-05 (×11): 5000 [IU] via SUBCUTANEOUS
  Filled 2022-01-01 (×11): qty 1

## 2022-01-01 MED ORDER — POLYETHYLENE GLYCOL 3350 17 G PO PACK: 17.0000 g | PACK | Freq: Every day | ORAL | Status: DC | PRN

## 2022-01-01 MED ORDER — SPIRONOLACTONE 25 MG PO TABS: 25.0000 mg | ORAL_TABLET | Freq: Every day | ORAL | Status: DC

## 2022-01-01 NOTE — ED Notes (Signed)
Pt vomiting at this time. MD made aware and zofran ordered. Pt wife at bedside, call bell in reach.

## 2022-01-01 NOTE — ED Provider Notes (Signed)
[facility] Provider Note   CSN: [id number] Arrival date & time: 01/01/22  1013     History  Chief Complaint  Patient presents with   Weakness    NOEL RODIER is a 76 y.o. male.  Patient has a history of kidney disease and does peritoneal dialysis patient.  He also has had a stroke.  He was discharged from the hospital on June 21 and has not stopped vomiting since that time.  The patient had a video visit with his gastroenterologist yesterday and Dr. Loletha Carrow from [facility] told him to come to the hospital for admission and most likely GI consult  The history is provided by the patient and the spouse.  Weakness Severity:  Moderate Onset quality:  Gradual Timing:  Constant Progression:  Worsening Chronicity:  Recurrent Context: not alcohol use   Relieved by:  Nothing Worsened by:  Nothing Ineffective treatments:  None tried Associated symptoms: vomiting   Associated symptoms: no abdominal pain, no chest pain, no cough, no diarrhea, no frequency, no headaches and no seizures        Home Medications Prior to Admission medications   Medication Sig Start Date End Date Taking? Authorizing Provider  albuterol (PROVENTIL HFA;VENTOLIN HFA) 108 (90 BASE) MCG/ACT inhaler Inhale 2 puffs into the lungs every 6 (six) hours as needed for wheezing or shortness of breath.    Yes [provider]  ALPRAZolam (XANAX) 0.25 MG tablet Take 0.25 mg by mouth at bedtime as needed for anxiety.   Yes [provider]  aspirin EC 81 MG tablet Take 81 mg by mouth daily. Swallow whole.   Yes [provider]  collagenase (SANTYL) 250 UNIT/GM ointment Apply 1 application. topically daily as needed (sore).   Yes [provider]  cyanocobalamin (,VITAMIN B-12,) 1000 MCG/ML injection Inject 1,000 mcg into the muscle every 30 (thirty) days.   Yes [provider]  docusate sodium (COLACE) 100 MG capsule Take 200 mg by mouth 2 (two) times daily as  needed for mild constipation.   Yes [provider]  finasteride (PROSCAR) 5 MG tablet Take 5 mg by mouth daily.   Yes [provider]  fluticasone (FLONASE) 50 MCG/ACT nasal spray Place 1 spray into both nostrils daily as needed for allergies.   Yes [provider]  levothyroxine (SYNTHROID) 175 MCG tablet Take 175 mcg by mouth daily before breakfast.  05/12/12  Yes [provider]  pantoprazole (PROTONIX) 40 MG tablet Take 40 mg by mouth daily.   Yes [provider]  polyethylene glycol (MIRALAX / GLYCOLAX) 17 g packet Take 17 g by mouth daily. Patient taking differently: Take 17 g by mouth daily as needed for mild constipation or moderate constipation. 06/14/20  Yes Regalado, Belkys A, MD  potassium chloride (KLOR-CON) 10 MEQ tablet Take 10 mEq by mouth daily. 12/08/21  Yes [provider]  sertraline (ZOLOFT) 50 MG tablet Take 50 mg by mouth daily.   Yes [provider]  simvastatin (ZOCOR) 40 MG tablet Take 1 tablet (40 mg total) by mouth every evening. 11/07/21  Yes Strader, Fransisco Hertz, PA-C  spironolactone (ALDACTONE) 25 MG tablet Take 25 mg by mouth daily.   Yes [provider]  torsemide (DEMADEX) 100 MG tablet Take 1 tablet by mouth 2 (two) times daily. 03/26/21  Yes [provider]  TRINTELLIX 20 MG TABS tablet Take 20 mg by mouth daily. 03/19/21  Yes [provider]      Allergies  Patient has no known allergies.    Review of Systems   Review of Systems  Constitutional:  Negative for appetite change and fatigue.  HENT:  Negative for congestion, ear discharge and sinus pressure.   Eyes:  Negative for discharge.  Respiratory:  Negative for cough.   Cardiovascular:  Negative for chest pain.  Gastrointestinal:  Positive for vomiting. Negative for abdominal pain and diarrhea.  Genitourinary:  Negative for frequency and hematuria.  Musculoskeletal:  Negative for back pain.  Skin:  Negative for  rash.  Neurological:  Positive for weakness. Negative for seizures and headaches.  Psychiatric/Behavioral:  Negative for hallucinations.     Physical Exam Updated Vital Signs BP 125/72   Pulse 68   Temp 98.1 F (36.7 C) (Oral)   Resp 20   Ht '5\' 8"'$  (1.727 m)   Wt 102 kg   SpO2 97%   BMI 34.19 kg/m  Physical Exam Vitals and nursing note reviewed.  Constitutional:      Appearance: He is well-developed.  HENT:     Head: Normocephalic.     Nose: Nose normal.  Eyes:     General: No scleral icterus.    Conjunctiva/sclera: Conjunctivae normal.  Neck:     Thyroid: No thyromegaly.  Cardiovascular:     Rate and Rhythm: Normal rate and regular rhythm.     Heart sounds: No murmur heard.    No friction rub. No gallop.  Pulmonary:     Breath sounds: No stridor. No wheezing or rales.  Chest:     Chest wall: No tenderness.  Abdominal:     General: There is no distension.     Tenderness: There is no abdominal tenderness. There is no rebound.  Musculoskeletal:        General: Normal range of motion.     Cervical back: Neck supple.  Lymphadenopathy:     Cervical: No cervical adenopathy.  Skin:    Findings: No erythema or rash.  Neurological:     Mental Status: He is alert and oriented to person, place, and time.     Motor: No abnormal muscle tone.     Coordination: Coordination normal.  Psychiatric:        Behavior: Behavior normal.     ED Results / Procedures / Treatments   Labs (all labs ordered are listed, but only abnormal results are displayed) Labs Reviewed  CBC WITH DIFFERENTIAL/PLATELET - Abnormal; Notable for the following components:      Result Value   RBC 4.12 (*)    Abs Immature Granulocytes 0.14 (*)    All other components within normal limits  COMPREHENSIVE METABOLIC PANEL - Abnormal; Notable for the following components:   Sodium 133 (*)    Chloride 95 (*)    Glucose, Bld 130 (*)    BUN 36 (*)    Creatinine, Ser 6.82 (*)    Albumin 3.2 (*)    GFR,  Estimated 8 (*)    All other components within normal limits  LACTIC ACID, PLASMA - Abnormal; Notable for the following components:   Lactic Acid, Venous 2.7 (*)    All other components within normal limits  LACTIC ACID, PLASMA    EKG None  Radiology No results found.  Procedures Procedures    Medications Ordered in ED Medications  sodium chloride 0.9 % bolus 1,000 mL (0 mLs Intravenous Stopped 01/01/22 1114)  sodium chloride 0.9 % bolus 500 mL (0 mLs Intravenous Stopped 01/01/22 1141)    ED Course/ Medical  Decision Making/ A&P                           Medical Decision Making Amount and/or Complexity of Data Reviewed Labs: ordered.  Risk Decision regarding hospitalization.  This patient presents to the ED for concern of vomiting, this involves an extensive number of treatment options, and is a complaint that carries with it a high risk of complications and morbidity.  The differential diagnosis includes gastritis, secondary to peritoneal dialysis   Co morbidities that complicate the patient evaluation  Kidney disease   Additional history obtained:  Additional history obtained from wife External records from outside source obtained and reviewed including hospital records   Lab Tests:  I Ordered, and personally interpreted labs.  The pertinent results include: CBC shows normal white count 9.0 normal hemoglobin, patient has history of kidney failure with creatinine 6.8   Imaging Studies ordered:  No imaging Cardiac Monitoring: / EKG:  The patient was maintained on a cardiac monitor.  I personally viewed and interpreted the cardiac monitored which showed an underlying rhythm of: Normal sinus rhythm   Consultations Obtained:  I requested consultation with the hospitalist,  and discussed lab and imaging findings as well as pertinent plan - they recommend: Admission for vomiting   Problem List / ED Course / Critical interventions / Medication  management  Kidney failure and persistent vomiting I ordered medication including normal saline for dehydration Reevaluation of the patient after these medicines showed that the patient stayed the same I have reviewed the patients home medicines and have made adjustments as needed   Social Determinants of Health:  Dialysis patient   Test / Admission - Considered:  CT of abdomen considered  Patient with persistent vomiting and dehydration.  He will be admitted to medicine over [facility] because he is a peritoneal dialysis patient and  GI will consult        Final Clinical Impression(s) / ED Diagnoses Final diagnoses:  Nausea and vomiting, unspecified vomiting type    Rx / DC Orders ED Discharge Orders     None         Milton Ferguson, MD 01/02/22 1720

## 2022-01-01 NOTE — H&P (Signed)
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  Patient was just recently discharged from [facility] for treatment of peritonitis associated with his PD treatments.  He completed the antibiotic course and his peritoneal fluid has been clear according to wife.  He has had no fever and chills.  Unfortunately he has continued to decline with poor oral intake due to difficulty with swallowing which has been a chronic problem for him.  His wife has been trying to to arrange for outpatient palliative services however has not been successful.  Patient has continued to decline and now basically has become totally bedbound requiring assistance with all of his ADLs.  He has become severely dehydrated because he has not been able to eat or drink well due to his dysphagia.  Wife is requesting inpatient palliative consultation for goals of care and treatment of his acute dehydration.  Review of Systems: Review of Systems  Constitutional:  Positive for malaise/fatigue and weight loss. Negative for chills, diaphoresis and fever.  HENT: Negative.    Eyes: Negative.   Respiratory:  Positive for cough. Negative for sputum production, shortness of breath and wheezing.   Cardiovascular: Negative.   Gastrointestinal:  Positive for nausea and  vomiting. Negative for abdominal pain, blood in stool, constipation and diarrhea.  Genitourinary: Negative.   Musculoskeletal: Negative.   Skin: Negative.   Neurological:  Positive for weakness. Negative for focal weakness and loss of consciousness.  Endo/Heme/Allergies: Negative.   Psychiatric/Behavioral:  Positive for depression. The patient is nervous/anxious.   All other systems reviewed and are negative.    Past Medical History:  Diagnosis Date   Aortic stenosis    Status post AVR, 25 mm Edwards pericardial Magna-Ease valve 2014   Arthritis    CAD (coronary artery disease)    Status post SVG to OM 2014   COPD (chronic obstructive pulmonary disease) ([place])    Depression    Dialysis patient ([place])    on PD 7 night weekly at home   ESRD on peritoneal dialysis [facility])    Essential hypertension    Gout    Hemorrhoid    Hypertension    Hypothyroidism    Kidney stones    Lumbar spondylolysis    Mixed hyperlipidemia    Orthostatic hypotension    Osteoarthritis    Pulmonary emboli ([place]) 08/21/2020   Syncope    Vitamin D deficiency     Past Surgical History:  Procedure Laterality Date   ANGIOPASTY     AORTIC VALVE REPLACEMENT  07/11/2012   Procedure: AORTIC VALVE REPLACEMENT (AVR);  Surgeon: Gaye Pollack, MD;  Location: [facility];  Service: Open Heart Surgery;  Laterality: N/A;   AV FISTULA PLACEMENT Left 05/30/2020   Procedure: ARTERIOVENOUS (AV) FISTULA CREATION LEFT;  Surgeon: [name], MD;  Location: [facility];  Service: Vascular;  Laterality: Left;  CARDIAC CATHETERIZATION     CAROTID ENDARTERECTOMY Left    CATARACT EXTRACTION W/PHACO Right 06/09/2021   Procedure: CATARACT EXTRACTION RIGHT EYE W/ PHACO AND INTRAOCULAR LENS PLACEMENT (IOC);  Surgeon: Baruch Goldmann, MD;  Location: [facility];  Service: Ophthalmology;  Laterality: Right;  CDE: 12.97   CATARACT EXTRACTION W/PHACO Left 06/27/2021   Procedure: CATARACT EXTRACTION PHACO AND INTRAOCULAR LENS PLACEMENT ([place]) WITH  IACCESS GONIOTOMY;  Surgeon: Baruch Goldmann, MD;  Location: [facility];  Service: Ophthalmology;  Laterality: Left;  CDE: 10.08   CIRCUMCISION     COLONOSCOPY WITH PROPOFOL N/A 07/16/2020   Procedure: COLONOSCOPY WITH PROPOFOL;  Surgeon: Doran Stabler, MD;  Location: [facility];  Service: Gastroenterology;  Laterality: N/A;   CORONARY ARTERY BYPASS GRAFT  07/11/2012   Procedure: CORONARY ARTERY BYPASS GRAFTING (CABG);  Surgeon: Gaye Pollack, MD;  Location: [facility];  Service: Open Heart Surgery;  Laterality: N/A;  CABG x one,  using right leg greater saphenous vein harvested endoscopically   ESOPHAGOGASTRODUODENOSCOPY (EGD) WITH PROPOFOL N/A 07/15/2020   Procedure: ESOPHAGOGASTRODUODENOSCOPY (EGD) WITH PROPOFOL;  Surgeon: Doran Stabler, MD;  Location: [facility];  Service: Gastroenterology;  Laterality: N/A;   HOT HEMOSTASIS N/A 07/16/2020   Procedure: HOT HEMOSTASIS (ARGON PLASMA COAGULATION/BICAP);  Surgeon: Doran Stabler, MD;  Location: [place];  Service: Gastroenterology;  Laterality: N/A;   INSERTION OF ANTERIOR SEGMENT AQUEOUS DRAINAGE DEVICE (ISTENT) Right 06/09/2021   Procedure: INSERTION OF RIGHT EYE ANTERIOR SEGMENT AQUEOUS DRAINAGE DEVICE (ISTENT);  Surgeon: Baruch Goldmann, MD;  [facility]: [facility];  Service: Ophthalmology;  Laterality: Right;  CDE: 12.97   INTRAOPERATIVE TRANSESOPHAGEAL ECHOCARDIOGRAM  07/11/2012   Procedure: INTRAOPERATIVE TRANSESOPHAGEAL ECHOCARDIOGRAM;  Surgeon: Gaye Pollack, MD;  Location: [facility] [facility];  Service: Open Heart Surgery;  Laterality: N/A;   IR PERC TUN PERIT CATH WO PORT S&I /IMAG  06/13/2020   IR REMOVAL TUN CV CATH W/O FL  07/26/2020   IR US GUIDE VASC ACCESS RIGHT  06/13/2020   JOINT REPLACEMENT Bilateral    knees   TEE WITHOUT CARDIOVERSION N/A 06/12/2020   Procedure: TRANSESOPHAGEAL ECHOCARDIOGRAM (TEE);  Surgeon: Skeet Latch, MD;  Location: [facility];  Service: Cardiovascular;  Laterality: N/A;     reports that he quit  smoking about 11 years ago. His smoking use included cigarettes. He has a 150.00 pack-year smoking history. He has quit using smokeless tobacco. He reports that he does not currently use alcohol. He reports that he does not use drugs.  No Known Allergies  Family History  Problem Relation Age of Onset   Heart disease Mother    Heart disease Father    Hypertension Father    Colon cancer Neg Hx    Esophageal cancer Neg Hx    Stomach cancer Neg Hx     Prior to Admission medications   Medication Sig Start Date End Date Taking? Authorizing Provider  albuterol (PROVENTIL HFA;VENTOLIN HFA) 108 (90 BASE) MCG/ACT inhaler Inhale 2 puffs into the lungs every 6 (six) hours as needed for wheezing or shortness of breath.    Yes [provider]  ALPRAZolam (XANAX) 0.25 MG tablet Take 0.25 mg by mouth at bedtime as needed for anxiety.   Yes [provider]  aspirin EC 81 MG tablet Take 81 mg by mouth daily. Swallow whole.   Yes [provider]  collagenase (SANTYL) 250 UNIT/GM ointment Apply 1 application. topically daily as needed (sore).   Yes [provider]  cyanocobalamin (,VITAMIN B-12,) 1000  MCG/ML injection Inject 1,000 mcg into the muscle every 30 (thirty) days.   Yes [provider]  docusate sodium (COLACE) 100 MG capsule Take 200 mg by mouth 2 (two) times daily as needed for mild constipation.   Yes [provider]  finasteride (PROSCAR) 5 MG tablet Take 5 mg by mouth daily.   Yes [provider]  fluticasone (FLONASE) 50 MCG/ACT nasal spray Place 1 spray into both nostrils daily as needed for allergies.   Yes [provider]  levothyroxine (SYNTHROID) 175 MCG tablet Take 175 mcg by mouth daily before breakfast.  05/12/12  Yes [provider]  pantoprazole (PROTONIX) 40 MG tablet Take 40 mg by mouth daily.   Yes [provider]  polyethylene glycol (MIRALAX / GLYCOLAX) 17 g packet Take 17 g by mouth  daily. Patient taking differently: Take 17 g by mouth daily as needed for mild constipation or moderate constipation. 06/14/20  Yes Regalado, Belkys A, MD  potassium chloride (KLOR-CON) 10 MEQ tablet Take 10 mEq by mouth daily. 12/08/21  Yes [provider]  sertraline (ZOLOFT) 50 MG tablet Take 50 mg by mouth daily.   Yes [provider]  simvastatin (ZOCOR) 40 MG tablet Take 1 tablet (40 mg total) by mouth every evening. 11/07/21  Yes Strader, Fransisco Hertz, PA-C  spironolactone (ALDACTONE) 25 MG tablet Take 25 mg by mouth daily.   Yes [provider]  torsemide (DEMADEX) 100 MG tablet Take 1 tablet by mouth 2 (two) times daily. 03/26/21  Yes [provider]  TRINTELLIX 20 MG TABS tablet Take 20 mg by mouth daily. 03/19/21  Yes [provider]    Physical Exam: Vitals:   01/01/22 1200 01/01/22 1230 01/01/22 1400 01/01/22 1430  BP: 121/85 125/72 130/83 (!) 136/58  Pulse: 71 68 77 72  Resp: 18 20 (!) 21 12  Temp:      TempSrc:      SpO2: 100% 97% 100% 99%  Weight:      Height:        Constitutional: elderly male, appears chronically ill and debilitated, he is slow to respond, with some cognitive delay, NAD, calm, comfortable Eyes: PERRL, lids and conjunctivae normal ENMT: Mucous membranes are moist. Posterior pharynx clear of any exudate or lesions.  Neck: normal, supple, no masses, no thyromegaly Respiratory: clear to auscultation bilaterally, no wheezing, no crackles. Normal respiratory effort. No accessory muscle use.  Cardiovascular: normal s1, s2 sounds, no murmurs / rubs / gallops. No extremity edema. 2+ pedal pulses. No carotid bruits.  Abdomen: no tenderness, no masses palpated. PD site appears clean and no erythema seen. No hepatosplenomegaly. Bowel sounds positive.  Musculoskeletal: no clubbing / cyanosis. No joint deformity upper and lower extremities. Good ROM, no contractures. Normal muscle tone.  Skin: no gross open lesions seen.   Neurologic: CN 2-12 grossly intact. Sensation intact, DTR normal. Strength 5/5 in all 4.  Psychiatric: Poor judgment and insight. Alert and oriented x 2. Normal mood.   Labs on Admission: I have personally reviewed following labs and imaging studies  CBC: Recent Labs  Lab 01/01/22 1116  WBC 9.0  NEUTROABS 5.8  HGB 14.0  HCT 40.4  MCV 98.1  PLT 009   Basic Metabolic Panel: Recent Labs  Lab 01/01/22 1116  NA 133*  K 3.5  CL 95*  CO2 26  GLUCOSE 130*  BUN 36*  CREATININE 6.82*  CALCIUM 9.6   GFR: Estimated Creatinine Clearance: 10.7 mL/min (A) (by C-G formula based  on SCr of 6.82 mg/dL (H)). Liver Function Tests: Recent Labs  Lab 01/01/22 1116  AST 21  ALT 9  ALKPHOS 87  BILITOT 0.6  PROT 7.2  ALBUMIN 3.2*   No results for input(s): "LIPASE", "AMYLASE" in the last 168 hours. No results for input(s): "AMMONIA" in the last 168 hours. Coagulation Profile: No results for input(s): "INR", "PROTIME" in the last 168 hours. Cardiac Enzymes: No results for input(s): "CKTOTAL", "CKMB", "CKMBINDEX", "TROPONINI" in the last 168 hours. BNP (last 3 results) No results for input(s): "PROBNP" in the last 8760 hours. HbA1C: No results for input(s): "HGBA1C" in the last 72 hours. CBG: No results for input(s): "GLUCAP" in the last 168 hours. Lipid Profile: No results for input(s): "CHOL", "HDL", "LDLCALC", "TRIG", "CHOLHDL", "LDLDIRECT" in the last 72 hours. Thyroid Function Tests: No results for input(s): "TSH", "T4TOTAL", "FREET4", "T3FREE", "THYROIDAB" in the last 72 hours. Anemia Panel: No results for input(s): "VITAMINB12", "FOLATE", "FERRITIN", "TIBC", "IRON", "RETICCTPCT" in the last 72 hours. Urine analysis:    Component Value Date/Time   COLORURINE AMBER (A) 12/14/2021 0433   APPEARANCEUR CLOUDY (A) 12/14/2021 0433   LABSPEC 1.030 12/14/2021 0433   PHURINE 5.0 12/14/2021 0433   GLUCOSEU 50 (A) 12/14/2021 0433   HGBUR LARGE (A) 12/14/2021 0433   BILIRUBINUR  NEGATIVE 12/14/2021 [place] 12/14/2021 0433   PROTEINUR 100 (A) 12/14/2021 0433   UROBILINOGEN 0.2 07/11/2012 0545   NITRITE NEGATIVE 12/14/2021 0433   LEUKOCYTESUR LARGE (A) 12/14/2021 0433    Radiological Exams on Admission: No results found.  EKG: Independently reviewed.   Assessment/Plan Principal Problem:   Intractable nausea and vomiting Active Problems:   Dysphagia   Hypertension   COPD (chronic obstructive pulmonary disease) (HCC)   CAD (coronary artery disease)   Essential (primary) hypertension   Hyperlipidemia, unspecified   Hypothyroidism, unspecified   S/P AVR (aortic valve replacement)   ESRD (end stage renal disease) on dialysis (HCC)   History of CVA (cerebrovascular accident)   Failure to thrive in adult   Abnormal weight loss   Dehydration   Lactic acidosis due to dehydration/poor oral intake   DNR (do not resuscitate)   Functional quadriplegia (HCC)    Intractable Nausea and Vomiting - this is a longstanding issue and related to his chronic dysphagia - IV fluids given for dehydration and IV medication ordered for nausea symptoms - continue supportive measures - continue protonix for GI protection  Oropharyngeal Dysphagia  - he was seen by his GI Dr. Loletha Carrow on 7/5 and noted that this is likely related to his generalized poor medical condition and he was favorable to getting a palliative medicine consultation which is being ordered - continue dysphagia 1 diet as ordered - further goals of care to be discussed with palliative team   DNR present on admission - continue DNR order while in hospital  - palliative consulted for ongoing goals of care discussions  Lactic acidosis/Dehydration - due to poor oral intake (wife notes has been very difficult to get him to eat/drink in last 3 days) - small IV fluid boluses given in ED  - encourage oral intake  - temporarily holding home diuretics (he still makes urine)  Failure to thrive /  Abnormal weight loss - unfortunately patient continues to decline and prognosis remains very poor - agree with getting palliative consult.  I discussed with patient / wife and they are agreeable  Depression/Anxiety of chronic disease - resume home medications   Hypothyroidism  -  resume home levothyroxine before breakfast   Functional Quadriplegia  - wife reports patient has become bedbound and totally dependent at this time - agreeable to palliative care consultation for goals of care   History of CVA - resume home aspirin 81 mg daily and simvastatin 40 mg daily  ESRD on PD - pt admitted to [facility] due to no PD service at [facility] - consulted to nephrology and sent message to notify nephrologist of admission to [facility] - resume PD per nephrology team - He still makes urine and we have temporarily held home diuretics in setting of dehydration, poor oral intake and lactic acidosis    DVT prophylaxis: sq heparin  Code Status: DNR  Family Communication: wife at bedside updated 7/6  Disposition Plan: TBD  Consults called: palliative care, nephrology   Admission status: OBS  Level of care: Med-Surg Irwin Brakeman MD [facility] How to contact the [facility] Attending or Consulting provider [place] or covering provider during after hours 7P -7A, for this patient?  Check the care team in [facility] and look for a) attending/consulting [facility] provider listed and b) the [facility] team listed Log into [email or web address] and use [place]'s universal password to access. If you do not have the password, please contact the hospital operator. Locate the [facility] provider you are looking for under [facility] and page to a number that you can be directly reached. If you still have difficulty reaching the provider, please page the [facility] (Director on Call) for the Hospitalists listed on amion for assistance.   If 7PM-7AM, please contact night-coverage [email or web address] Password [facility]  01/01/2022, 3:54 PM

## 2022-01-01 NOTE — Hospital Course (Addendum)
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 Loletha Carrow for failure to thrive.  Patient was just recently discharged from [facility] for treatment of peritonitis associated with his PD treatments.  He completed the antibiotic course and his peritoneal fluid has been clear according to wife.  He has had no fever and chills.  Unfortunately he has continued to decline with poor oral intake due to difficulty with swallowing which has been a chronic problem for him.  His wife has been trying to to arrange for outpatient palliative services however has not been successful.  Patient has continued to decline and now basically has become totally bedbound requiring assistance with all of his ADLs.  He has become severely dehydrated because he has not been able to eat or drink well due to his dysphagia.  Wife is requesting inpatient palliative consultation for goals of care and treatment of his acute dehydration.

## 2022-01-01 NOTE — ED Triage Notes (Signed)
Pt to ED via [facility] c/o weakness, decreased appetite, nausea. Left sided deficit due to previous stroke. Just hospitalized for peritonitis. Does peritoneal dialysis.

## 2022-01-01 NOTE — ED Notes (Signed)
Lab at bedside

## 2022-01-01 NOTE — TOC Progression Note (Signed)
  Transition of Care (TOC) Screening Note   Patient Details  Name: [name] Date of Birth: October 21, 1945   Transition of Care [facility]) CM/SW Contact:    Boneta Lucks, RN Phone Number: 01/01/2022, 3:18 PM  Active with [facility]  Transition of Care Department [facility]) has reviewed patient and no TOC needs have been identified at this time. We will continue to monitor patient advancement through interdisciplinary progression rounds. If new patient transition needs arise, please place a TOC consult.    Expected Discharge Plan: [place] Barriers to Discharge: Continued Medical Work up  Expected Discharge Plan and Services Expected Discharge Plan: [place]

## 2022-01-01 NOTE — ED Notes (Signed)
Date and time results received: 01/01/22 12:03 PM   Test: Lactic Acid  Critical Value: 2.7  Name of Provider Notified:   Orders Received? Or Actions Taken?: See orders

## 2022-01-01 NOTE — ED Notes (Signed)
Lab at bedside collecting blood

## 2022-01-02 DIAGNOSIS — N186 End stage renal disease: Secondary | ICD-10-CM | POA: Diagnosis not present

## 2022-01-02 DIAGNOSIS — R1312 Dysphagia, oropharyngeal phase: Secondary | ICD-10-CM | POA: Diagnosis not present

## 2022-01-02 DIAGNOSIS — R112 Nausea with vomiting, unspecified: Secondary | ICD-10-CM | POA: Diagnosis not present

## 2022-01-02 DIAGNOSIS — I10 Essential (primary) hypertension: Secondary | ICD-10-CM | POA: Diagnosis not present

## 2022-01-02 DIAGNOSIS — E440 Moderate protein-calorie malnutrition: Secondary | ICD-10-CM | POA: Insufficient documentation

## 2022-01-02 DIAGNOSIS — Z515 Encounter for palliative care: Secondary | ICD-10-CM

## 2022-01-02 LAB — C-REACTIVE PROTEIN: CRP: 0.7 mg/dL (ref <1.0)

## 2022-01-02 LAB — BASIC METABOLIC PANEL
Anion gap: 10 (ref 5–15)
BUN: 36 mg/dL — ABNORMAL HIGH (ref 8–23)
CO2: 27 mmol/L (ref 22–32)
Calcium: 9.7 mg/dL (ref 8.9–10.3)
Chloride: 98 mmol/L (ref 98–111)
Creatinine, Ser: 7.53 mg/dL — ABNORMAL HIGH (ref 0.61–1.24)
GFR, Estimated: 7 mL/min — ABNORMAL LOW (ref >60)
Glucose, Bld: 124 mg/dL — ABNORMAL HIGH (ref 70–99)
Potassium: 4 mmol/L (ref 3.5–5.1)
Sodium: 135 mmol/L (ref 135–145)

## 2022-01-02 LAB — MAGNESIUM: Magnesium: 1.7 mg/dL (ref 1.7–2.4)

## 2022-01-02 LAB — FOLATE: Folate: >40 ng/mL (ref >5.9)

## 2022-01-02 MED ORDER — NEPRO/CARBSTEADY PO LIQD
237.0000 mL | Freq: Two times a day (BID) | ORAL | Status: DC
  Administered 2022-01-02 – 2022-01-04 (×4): 237 mL via ORAL

## 2022-01-02 MED ORDER — GENTAMICIN SULFATE 0.1 % EX CREA
1.0000 | TOPICAL_CREAM | Freq: Every day | CUTANEOUS | Status: DC
  Administered 2022-01-03 – 2022-01-04 (×3): 1 via TOPICAL
  Filled 2022-01-02: qty 15

## 2022-01-02 MED ORDER — THIAMINE HCL 100 MG PO TABS
100.0000 mg | ORAL_TABLET | Freq: Every day | ORAL | Status: DC
  Administered 2022-01-02 – 2022-01-05 (×4): 100 mg via ORAL
  Filled 2022-01-02 (×4): qty 1

## 2022-01-02 MED ORDER — RENA-VITE PO TABS
1.0000 | ORAL_TABLET | Freq: Every day | ORAL | Status: DC
  Administered 2022-01-02 – 2022-01-04 (×3): 1 via ORAL
  Filled 2022-01-02 (×3): qty 1

## 2022-01-02 NOTE — Progress Notes (Addendum)
Initial Nutrition Assessment  DOCUMENTATION CODES:   Non-severe (moderate) malnutrition in context of chronic illness  INTERVENTION:   Noted palliative care consult pending. Pending GOC, RD recommend considering feeding tube placement if aggressive nutrition intervention algins with GOC. PEG vs PEJ pending cause of vomiting. However, PEG/PEJ placement in PD patients remains controversial due to increased risk of peritonitis and would need to be taken into account. Otherwise, RD will attempt to optimize oral intake.   Magic cup TID with meals, each supplement provides 290 kcal and 9 grams of protein cup  Nepro Shake po BID, each supplement provides 425 kcal and 19 grams protein  Renal MVI daily; add thiamine 100 mg daily x 7 days  Pt at very high risk for multiple vitamin/mineral deficiencies including Copper, Vitamin B1, B6, B12, Folate, Vitamin C. RD to check lab values and CRP. RD will not check B12 at this time as pt just received 1000 mcg IM injection and results will be skewed even if pt with deficiency.   NUTRITION DIAGNOSIS:   Moderate Malnutrition related to chronic illness (ESRD on PD, chronic dysphagia with chronic N/V) as evidenced by mild fat depletion, mild muscle depletion, energy intake < or equal to 75% for > or equal to 1 month.  GOAL:   Patient will meet greater than or equal to 90% of their needs  MONITOR:   PO intake, Supplement acceptance, Labs, Weight trends  REASON FOR ASSESSMENT:   Consult Assessment of nutrition requirement/status  ASSESSMENT:   76 yo male admitted with intractable N/V and dehydration with oropharyngeal dysphagia and FTT. Pt with ESRD on PD 7 night at week, prior CVA, chronic dysphagia, CAD, PE  Noted Palliative Care to see.   PD continues at this time. Current PD prescription:  5 cycles, 3L fill volume, 1.5% dextrose This provides estimates 306-459 kcals from dextrose  SLP evaluated today and pt placed on Dysphagia 1/Nectar thick  liquids. Per chart review, pt was last seen by SLP in June and recommended the same   Pt with untouched Puree meal tray at bedside. RD offered Magic Cup to patient and pt agreeable and eating some when RD left room post assessment. Pt reports he ate grits this AM.   Pt reports poor appetite and po intake prior to admission with daily episodes of vomiting, usually after every time he ate. Pt unable to provide much history in the way of diet with regards to po intake at home PTA.   Pt is function quadriplegia, pt has become bed bound and totally dependent at home. Pt's weakness and inability to mobilize may be at least partially related to malnutrition and/or vitamin deficiencies  Current wt 95 kg. Pt does indicate that he has lost weight. Noted weight around 102 kg in June, around 116-117 kg in June. If these weights are accurate and reflect dry weights then pt with 15.5% wt loss in 2 months  Pt currently meets criteria for moderate malnutrition based on current info available but may very well be severely malnourished  Labs: reviewed Meds: KCl  Flowsheet Row Most Recent Value  Orbital Region Mild depletion  Upper Arm Region Mild depletion  Thoracic and Lumbar Region Mild depletion  Buccal Region Mild depletion  Temple Region Mild depletion  Clavicle Bone Region Mild depletion  Clavicle and Acromion Bone Region Mild depletion  Scapular Bone Region Mild depletion  Dorsal Hand No depletion  Patellar Region Moderate depletion  Anterior Thigh Region Moderate depletion  Posterior Calf Region Moderate depletion  Edema (RD Assessment) Mild        Diet Order:   Diet Order             DIET - DYS 1 Room service appropriate? Yes; Fluid consistency: Nectar Thick  Diet effective now                   EDUCATION NEEDS:   Not appropriate for education at this time  Skin:  Skin Assessment: Skin Integrity Issues: Skin Integrity Issues:: Stage II Stage II: buttocks  Last BM:   7/7  Height:   Ht Readings from Last 1 Encounters:  01/02/22 '5\' 8"'$  (1.727 m)    Weight:   Wt Readings from Last 1 Encounters:  01/02/22 95 kg    BMI:  Body mass index is 31.84 kg/m.  Estimated Nutritional Needs:   Kcal:  2000-2200 kcals  Protein:  105-120 g  Fluid:  No fluid restriction as pt is on nightly PD    Kerman Passey MS, RDN, LDN, CNSC Registered Dietitian III Clinical Nutrition RD Pager and On-Call Pager Number Located in [place]

## 2022-01-02 NOTE — Evaluation (Signed)
Clinical/Bedside Swallow Evaluation Patient Details  Name: [name] MRN: [id number] Date of Birth: 12-15-1945  Today's Date: 01/02/2022 Time: SLP Start Time (ACUTE ONLY): 3220 SLP Stop Time (ACUTE ONLY): 2542 SLP Time Calculation (min) (ACUTE ONLY): 23 min  Past Medical History:  Past Medical History:  Diagnosis Date   Aortic stenosis    Status post AVR, 25 mm Edwards pericardial Magna-Ease valve 2014   Arthritis    CAD (coronary artery disease)    Status post SVG to OM 2014   COPD (chronic obstructive pulmonary disease) ([place])    Depression    Dialysis patient ([place])    on PD 7 night weekly at home   ESRD on peritoneal dialysis [facility])    Essential hypertension    Gout    Hemorrhoid    Hypertension    Hypothyroidism    Kidney stones    Lumbar spondylolysis    Mixed hyperlipidemia    Orthostatic hypotension    Osteoarthritis    Pulmonary emboli ([place]) 08/21/2020   Syncope    Vitamin D deficiency    Past Surgical History:  Past Surgical History:  Procedure Laterality Date   ANGIOPASTY     AORTIC VALVE REPLACEMENT  07/11/2012   Procedure: AORTIC VALVE REPLACEMENT (AVR);  Surgeon: Gaye Pollack, MD;  Location: [place];  Service: Open Heart Surgery;  Laterality: N/A;   AV FISTULA PLACEMENT Left 05/30/2020   Procedure: ARTERIOVENOUS (AV) FISTULA CREATION LEFT;  Surgeon: [name], MD;  Location: [facility];  Service: Vascular;  Laterality: Left;   CARDIAC CATHETERIZATION     CAROTID ENDARTERECTOMY Left    CATARACT EXTRACTION W/PHACO Right 06/09/2021   Procedure: CATARACT EXTRACTION RIGHT EYE W/ PHACO AND INTRAOCULAR LENS PLACEMENT ([place]);  Surgeon: Baruch Goldmann, MD;  Location: [facility];  Service: Ophthalmology;  Laterality: Right;  CDE: 12.97   CATARACT EXTRACTION W/PHACO Left 06/27/2021   Procedure: CATARACT EXTRACTION PHACO AND INTRAOCULAR LENS PLACEMENT ([place]) WITH IACCESS GONIOTOMY;  Surgeon: Baruch Goldmann, MD;  Location: [facility];  Service: Ophthalmology;  Laterality: Left;   CDE: 10.08   CIRCUMCISION     COLONOSCOPY WITH PROPOFOL N/A 07/16/2020   Procedure: COLONOSCOPY WITH PROPOFOL;  Surgeon: Doran Stabler, MD;  Location: [place];  Service: Gastroenterology;  Laterality: N/A;   CORONARY ARTERY BYPASS GRAFT  07/11/2012   Procedure: CORONARY ARTERY BYPASS GRAFTING (CABG);  Surgeon: Gaye Pollack, MD;  Location: [place];  Service: Open Heart Surgery;  Laterality: N/A;  CABG x one,  using right leg greater saphenous vein harvested endoscopically   ESOPHAGOGASTRODUODENOSCOPY (EGD) WITH PROPOFOL N/A 07/15/2020   Procedure: ESOPHAGOGASTRODUODENOSCOPY (EGD) WITH PROPOFOL;  Surgeon: Doran Stabler, MD;  Location: [place];  Service: Gastroenterology;  Laterality: N/A;   HOT HEMOSTASIS N/A 07/16/2020   Procedure: HOT HEMOSTASIS (ARGON PLASMA COAGULATION/BICAP);  Surgeon: Doran Stabler, MD;  Location: [place];  Service: Gastroenterology;  Laterality: N/A;   INSERTION OF ANTERIOR SEGMENT AQUEOUS DRAINAGE DEVICE (ISTENT) Right 06/09/2021   Procedure: INSERTION OF RIGHT EYE ANTERIOR SEGMENT AQUEOUS DRAINAGE DEVICE (ISTENT);  Surgeon: Baruch Goldmann, MD;  Location: [facility];  Service: Ophthalmology;  Laterality: Right;  CDE: 12.97   INTRAOPERATIVE TRANSESOPHAGEAL ECHOCARDIOGRAM  07/11/2012   Procedure: INTRAOPERATIVE TRANSESOPHAGEAL ECHOCARDIOGRAM;  Surgeon: Gaye Pollack, MD;  Location: [place] [facility];  Service: Open Heart Surgery;  Laterality: N/A;   IR PERC TUN PERIT CATH WO PORT S&I /IMAG  06/13/2020   IR REMOVAL TUN CV CATH W/O FL  07/26/2020  IR US GUIDE VASC ACCESS RIGHT  06/13/2020   JOINT REPLACEMENT Bilateral    knees   TEE WITHOUT CARDIOVERSION N/A 06/12/2020   Procedure: TRANSESOPHAGEAL ECHOCARDIOGRAM (TEE);  Surgeon: Skeet Latch, MD;  Location[facility] ENDOSCOPY;  Service: Cardiovascular;  Laterality: N/A;   HPI:  [name] is a 76 y.o. male with ESRD on CCPD, AS s/p AVR, CAD s/p CAGB, COPD, prior CVA, depression. Recent [facility] admission 6/17  -12/17/21  with PD cathter related peritonitis. Per chart he was brought to the ED by family for evaluation of worsening dysphagia and poor oral intake and progressive weakness and family are requesting palliative care consult per chart. Found to be dehydrated. MBS 12/16/21 aspirates thin and nectar when head extended and neutral position improves tolerance of nectar with straw and chin tuck prevents aspiraiton. Recommended puree diet and nectar thick liquids.    Assessment / Plan / Recommendation  Clinical Impression  Pt has chronic dysphagia seen 2 weeks ago for MBS recommending puree and nectar thick with chin tuck. SLP called wife who reported she has been continuing to thicken liquids at home to nectar but food consistency has been challenging. She minces food as well as blenderizes intermittently and if there are any solid particles, he has prolonged mastication and will ultimately expectorate. Same with crushed meds so she gives all pills at once with nectar thick liquids. Educated using pudding or yogurt with 2 pills at a time (she says they are small). Encouraged her to continue to use blender to achieve a smooth texture without particles. She has questions about alternate means of feeding (PEG) and stated "they did not want to do it last time because already has tube in stomach for dialysis". Discussed the nature of his swallow with his cognitive impairments and likely prognosis long term as well as PEG cannot prevent aspiration and has various risks as well. Today [name] has wet vocal quality at bedside on SLP's arrival, is endentulous with strong volitional cough. He had thin liquids at bedside and produced an immediate reflexive cough indicating likely airway intrusion and RN tech relayed coughing with her as well with thin. Nectar thick appears to mitigate risk although wet vocal quality persists inconsistently. He cannot consistently perform chin tuck although did tuck several times without  cueing but timing was variable. Recommend continue Dys 1 (puree), nectar thick liquids, chin tuck, crush meds and full supervision. Likely dyshagia will continue to persist and will have increased needs or diet/liquid modifications that do not make significant ifference. Discussed with wife potential for oral holding in the future. Continue ST. SLP Visit Diagnosis: Dysphagia, unspecified (R13.10)    Aspiration Risk  Moderate aspiration risk    Diet Recommendation Dysphagia 1 (Puree);Nectar-thick liquid   Liquid Administration via: Cup;Straw Medication Administration: Crushed with puree Supervision: Staff to assist with self feeding;Full supervision/cueing for compensatory strategies Compensations: Slow rate;Chin tuck;Use straw to facilitate chin tuck Postural Changes: Seated upright at 90 degrees    Other  Recommendations Oral Care Recommendations: Oral care BID    Recommendations for follow up therapy are one component of a multi-disciplinary discharge planning process, led by the attending physician.  Recommendations may be updated based on patient status, additional functional criteria and insurance authorization.  Follow up Recommendations Home health SLP      Assistance Recommended at Discharge Frequent or constant Supervision/Assistance  Functional Status Assessment Patient has had a recent decline in their functional status and demonstrates the ability to make significant improvements in  function in a reasonable and predictable amount of time.  Frequency and Duration min 2x/week  2 weeks       Prognosis Prognosis for Safe Diet Advancement: Fair Barriers to Reach Goals: Cognitive deficits      Swallow Study   General Date of Onset: 01/01/22 HPI: [name] is a 76 y.o. male with ESRD on CCPD, AS s/p AVR, CAD s/p CAGB, COPD, prior CVA, depression. Recent [facility] admission 6/17 -12/17/21  with PD cathter related peritonitis. Per chart he was brought to the ED by family for  evaluation of worsening dysphagia and poor oral intake and progressive weakness and family are requesting palliative care consult per chart. Found to be dehydrated. MBS 12/16/21 aspirates thin and nectar when head extended and neutral position improves tolerance of nectar with straw and chin tuck prevents aspiraiton. Recommended puree diet and nectar thick liquids. Type of Study: Bedside Swallow Evaluation Previous Swallow Assessment: see HPI Diet Prior to this Study: Nectar-thick liquids;Dysphagia 3 (soft) Temperature Spikes Noted: No Respiratory Status: Room air History of Recent Intubation: No Behavior/Cognition: Alert;Cooperative;Pleasant mood;Distractible Oral Cavity Assessment:  (mild lingual candidias) Oral Care Completed by SLP: No Oral Cavity - Dentition: Edentulous Vision: Functional for self-feeding Self-Feeding Abilities: Needs assist Patient Positioning: Upright in bed Baseline Vocal Quality: Wet Volitional Cough: Strong Volitional Swallow: Able to elicit    Oral/Motor/Sensory Function Overall Oral Motor/Sensory Function: Within functional limits   Ice Chips Ice chips: Not tested   Thin Liquid Thin Liquid: Impaired Presentation: Straw Pharyngeal  Phase Impairments: Cough - Immediate    Nectar Thick Nectar Thick Liquid: Impaired Pharyngeal Phase Impairments: Wet Vocal Quality   Honey Thick Honey Thick Liquid: Not tested   Puree Puree: Impaired Pharyngeal Phase Impairments: Wet Vocal Quality   Solid     Solid: Not tested      Houston Siren 01/02/2022,2:31 PM

## 2022-01-02 NOTE — Plan of Care (Signed)

## 2022-01-02 NOTE — Progress Notes (Signed)
   Palliative Medicine Inpatient Follow Up Note   Palliative care has acknowledged the consultation for [place].   Patients spouse, Kendrell Lottman has been contacted.  Family meeting arranged for 1500 tomorrow.  No Charge ______________________________________________________________________________________ [place] Team Team Cell Phone: [phone] Please utilize secure chat with additional questions, if there is no response within 30 minutes please call the above phone number  Palliative Medicine Team providers are available by phone from 7am to 7pm daily and can be reached through the team cell phone.  Should this patient require assistance outside of these hours, please call the patient's attending physician.

## 2022-01-02 NOTE — Progress Notes (Signed)
[facility] PROGRESS NOTE   TRACI PLEMONS XFG:[id number] DOB: 04/26/1946 DOA: 01/01/2022  PCP: Celene Squibb, MD  Brief History/Interval Summary: 76 year old gentleman who is DNR with cerebrovascular disease status post prior CVA, ESRD on nightly PD, chronic dysphagia on pure diet, moderate MS and moderate TR, status post AVR with bioprosthetic valve, paroxysmal atrial flutter, coronary artery disease, history of PE, chronic constipation, hypothyroidism was sent to the emergency department by his GI physician for failure to thrive.  Patient was just recently discharged from [facility] for treatment of peritonitis associated with his PD treatments.  He completed the antibiotic course and his peritoneal fluid has been clear according to wife.  He has had no fever and chills.  Unfortunately he has continued to decline with poor oral intake due to difficulty with swallowing which has been a chronic problem for him.  His wife has been trying to to arrange for outpatient palliative services however has not been successful.  Patient has continued to decline and now basically has become totally bedbound requiring assistance with all of his ADLs.  He has become severely dehydrated because he has not been able to eat or drink well due to his dysphagia.  Wife is requesting inpatient palliative consultation for goals of care and treatment of his acute dehydration.  Consultants: Palliative care.  Nephrology  Procedures: None    Subjective/Interval History: Patient noted to be distracted though he was ordered to me that he was in [place].  Did not know the month.  Did not know the year.  Denies any pain issues at this time.    Assessment/Plan:  Intractable nausea and vomiting This is apparently a longstanding issue.  Thought to be related to his oropharyngeal dysphagia.  Was noted to be dehydrated.  Was hospitalized and he was given IV fluids.  Abdomen noted to be benign on examination this  morning.  No clear indication to do imaging studies at this time. Last CT scan was a CT renal stone study done in May.  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  Also has had a general decline in his health.  So appears to be multifactorial.  We will request speech therapy evaluation while he is here in the hospital. He was last seen by them in June and at that time a dysphagia 1 diet was recommended with the nectar thick liquids Palliative medicine has been consulted for goals of care.  End-stage renal disease on peritoneal dialysis Nephrology has been consulted.  Dehydration Secondary to dysphagia.  Was given IV fluids in the emergency department.  History of stroke On aspirin and statin.  Hypothyroidism Continue levothyroxine.  History of depression and anxiety Resume home medications.  Functional quadriplegia Wife reported that patient has become bedbound and totally dependent at home.  Palliative care has been consulted for goals of care.    Obesity  Estimated body mass index is 31.84 kg/m as calculated from the following:   Height as of this encounter: '5\' 8"'$  (1.727 m).   Weight as of this encounter: 95 kg.   DVT Prophylaxis: Subcutaneous heparin Code Status: DNR Family Communication: Discussed with patient.  No family at bedside Disposition Plan: To be determined  Status is: Observation The patient will require care spanning > 2 midnights and should be moved to inpatient because: Failure to thrive, significant dysphagia, dehydration      Medications: Scheduled:  aspirin EC  81 mg Oral Daily   finasteride  5 mg Oral Daily  heparin  5,000 Units Subcutaneous Q8H   levothyroxine  175 mcg Oral QAC breakfast   pantoprazole  40 mg Oral Daily   potassium chloride  10 mEq Oral Daily   sertraline  50 mg Oral Daily   simvastatin  40 mg Oral QPM   vortioxetine HBr  20 mg Oral Daily   Continuous: HFW:YOVZCHYIF, ALPRAZolam, docusate sodium,  fluticasone, ondansetron (ZOFRAN) IV, polyethylene glycol  Antibiotics: Anti-infectives (From admission, onward)    None       Objective:  Vital Signs  Vitals:   01/02/22 0026 01/02/22 0030 01/02/22 0441 01/02/22 0930  BP: 133/75  105/62 113/80  Pulse: (!) 52  94 72  Resp: '18  18 18  '$ Temp: 97.7 F (36.5 C)  98.2 F (36.8 C) 98.4 F (36.9 C)  TempSrc: Oral     SpO2: 97%  99% 100%  Weight:  95 kg    Height:  '5\' 8"'$  (1.727 m)      Intake/Output Summary (Last 24 hours) at 01/02/2022 0944 Last data filed at 01/02/2022 0441 Gross per 24 hour  Intake 0 ml  Output 0 ml  Net 0 ml   Filed Weights   01/01/22 1020 01/02/22 0030  Weight: 102 kg 95 kg    General appearance: Awake alert.  In no distress.  Distracted. Resp: Clear to auscultation bilaterally.  Normal effort Cardio: S1-S2 is normal regular.  No S3-S4.  No rubs murmurs or bruit GI: Abdomen is soft.  Nontender nondistended.  Bowel sounds are present normal.  No masses organomegaly.  Peritoneal dialysis catheter is noted Extremities: No edema.  Full range of motion of lower extremities. Neurologic: No focal neurological deficits.    Lab Results:  Data Reviewed: I have personally reviewed following labs and reports of the imaging studies  CBC: Recent Labs  Lab 01/01/22 1116 01/01/22 1911  WBC 9.0 9.0  NEUTROABS 5.8  --   HGB 14.0 13.9  HCT 40.4 40.4  MCV 98.1 98.5  PLT 243 027    Basic Metabolic Panel: Recent Labs  Lab 01/01/22 1116 01/01/22 1911 01/02/22 0112  NA 133*  --  135  K 3.5  --  4.0  CL 95*  --  98  CO2 26  --  27  GLUCOSE 130*  --  124*  BUN 36*  --  36*  CREATININE 6.82* 7.10* 7.53*  CALCIUM 9.6  --  9.7  MG  --   --  1.7    GFR: Estimated Creatinine Clearance: 9.3 mL/min (A) (by C-G formula based on SCr of 7.53 mg/dL (H)).  Liver Function Tests: Recent Labs  Lab 01/01/22 1116  AST 21  ALT 9  ALKPHOS 87  BILITOT 0.6  PROT 7.2  ALBUMIN 3.2*     Radiology Studies: No  results found.     LOS: 0 days   Tahja Liao [employer] on [email or web address]  01/02/2022, 9:44 AM

## 2022-01-02 NOTE — Consult Note (Signed)
[name] Renal Consultation Note    Indication for Consultation:  Management of ESRD/hemodialysis; anemia, hypertension/volume and secondary hyperparathyroidism   HPI: [name] is a 76 y.o. male with ESRD on CCPD, AS s/p AVR, CAD s/p CAGB, COPD, prior CVA, depression. Recent [facility] admission 6/17 -12/17/21  with PD cathter related peritonitis. He completed a course of Ancef and per notes PD fluid has been clear. He was brought to the ED by family for evaluation of worsening dysphagia and poor oral intake. He has also become progressive weaker and family are requesting palliative care consult. Afebrile on admission. VSS. Labs Na 133, K 4.0, CO2 27, BUN 36 Cr. 7.5, WBC 9.0, Hgb 13.9. He was treated for dehydration and received a  500 mL fluid bolus in the ED. Nephrology consulted for dialysis needs. CCPD x 1 year. [facility].   Seen and examined at bedside. He is alone. States he came to the hospital because he couldn't eat. He is oriented to place and person, but says the year is 33. He denies any problems with PD. He denies fevers, chills, chest pain, abdominal pain, edema. No nausea/vomiting today. He does want to continue dialysis.   Past Medical History:  Diagnosis Date   Aortic stenosis    Status post AVR, 25 mm [place] pericardial Magna-Ease valve 2014   Arthritis    CAD (coronary artery disease)    Status post SVG to OM 2014   COPD (chronic obstructive pulmonary disease) ([place])    Depression    Dialysis patient ([place])    on PD 7 night weekly at home   ESRD on peritoneal dialysis [facility])    Essential hypertension    Gout    Hemorrhoid    Hypertension    Hypothyroidism    Kidney stones    Lumbar spondylolysis    Mixed hyperlipidemia    Orthostatic hypotension    Osteoarthritis    Pulmonary emboli ([place]) 08/21/2020   Syncope    Vitamin D deficiency    Past Surgical History:  Procedure Laterality Date   ANGIOPASTY     AORTIC VALVE REPLACEMENT   07/11/2012   Procedure: AORTIC VALVE REPLACEMENT (AVR);  Surgeon: Gaye Pollack, MD;  Location: [place];  Service: Open Heart Surgery;  Laterality: N/A;   AV FISTULA PLACEMENT Left 05/30/2020   Procedure: ARTERIOVENOUS (AV) FISTULA CREATION LEFT;  Surgeon: [name], MD;  Location: [facility];  Service: Vascular;  Laterality: Left;   CARDIAC CATHETERIZATION     CAROTID ENDARTERECTOMY Left    CATARACT EXTRACTION W/PHACO Right 06/09/2021   Procedure: CATARACT EXTRACTION RIGHT EYE W/ PHACO AND INTRAOCULAR LENS PLACEMENT ([place]);  Surgeon: Baruch Goldmann, MD;  Location: [facility];  Service: Ophthalmology;  Laterality: Right;  CDE: 12.97   CATARACT EXTRACTION W/PHACO Left 06/27/2021   Procedure: CATARACT EXTRACTION PHACO AND INTRAOCULAR LENS PLACEMENT ([place]) WITH IACCESS GONIOTOMY;  Surgeon: Baruch Goldmann, MD;  Location: [facility];  Service: Ophthalmology;  Laterality: Left;  CDE: 10.08   CIRCUMCISION     COLONOSCOPY WITH PROPOFOL N/A 07/16/2020   Procedure: COLONOSCOPY WITH PROPOFOL;  Surgeon: Doran Stabler, MD;  Location: [place];  Service: Gastroenterology;  Laterality: N/A;   CORONARY ARTERY BYPASS GRAFT  07/11/2012   Procedure: CORONARY ARTERY BYPASS GRAFTING (CABG);  Surgeon: Gaye Pollack, MD;  Location: [place];  Service: Open Heart Surgery;  Laterality: N/A;  CABG x one,  using right leg greater saphenous vein harvested endoscopically   ESOPHAGOGASTRODUODENOSCOPY (EGD) WITH  PROPOFOL N/A 07/15/2020   Procedure: ESOPHAGOGASTRODUODENOSCOPY (EGD) WITH PROPOFOL;  Surgeon: Doran Stabler, MD;  Location: [place];  Service: Gastroenterology;  Laterality: N/A;   HOT HEMOSTASIS N/A 07/16/2020   Procedure: HOT HEMOSTASIS (ARGON PLASMA COAGULATION/BICAP);  Surgeon: Doran Stabler, MD;  Location: [place];  Service: Gastroenterology;  Laterality: N/A;   INSERTION OF ANTERIOR SEGMENT AQUEOUS DRAINAGE DEVICE (ISTENT) Right 06/09/2021   Procedure: INSERTION OF RIGHT EYE ANTERIOR SEGMENT  AQUEOUS DRAINAGE DEVICE (ISTENT);  Surgeon: Baruch Goldmann, MD;  Location: [facility];  Service: Ophthalmology;  Laterality: Right;  CDE: 12.97   INTRAOPERATIVE TRANSESOPHAGEAL ECHOCARDIOGRAM  07/11/2012   Procedure: INTRAOPERATIVE TRANSESOPHAGEAL ECHOCARDIOGRAM;  Surgeon: Gaye Pollack, MD;  Location: [place] [facility];  Service: Open Heart Surgery;  Laterality: N/A;   IR PERC TUN PERIT CATH WO PORT S&I /IMAG  06/13/2020   IR REMOVAL TUN CV CATH W/O FL  07/26/2020   IR US GUIDE VASC ACCESS RIGHT  06/13/2020   JOINT REPLACEMENT Bilateral    knees   TEE WITHOUT CARDIOVERSION N/A 06/12/2020   Procedure: TRANSESOPHAGEAL ECHOCARDIOGRAM (TEE);  Surgeon: Skeet Latch, MD;  Location: [facility] ENDOSCOPY;  Service: Cardiovascular;  Laterality: N/A;   Family History  Problem Relation Age of Onset   Heart disease Mother    Heart disease Father    Hypertension Father    Colon cancer Neg Hx    Esophageal cancer Neg Hx    Stomach cancer Neg Hx    Social History:  reports that he quit smoking about 11 years ago. His smoking use included cigarettes. He has a 150.00 pack-year smoking history. He has quit using smokeless tobacco. He reports that he does not currently use alcohol. He reports that he does not use drugs. No Known Allergies Prior to Admission medications   Medication Sig Start Date End Date Taking? Authorizing Provider  albuterol (PROVENTIL HFA;VENTOLIN HFA) 108 (90 BASE) MCG/ACT inhaler Inhale 2 puffs into the lungs every 6 (six) hours as needed for wheezing or shortness of breath.    Yes [provider]  ALPRAZolam (XANAX) 0.25 MG tablet Take 0.25 mg by mouth at bedtime as needed for anxiety.   Yes [provider]  aspirin EC 81 MG tablet Take 81 mg by mouth daily. Swallow whole.   Yes [provider]  collagenase (SANTYL) 250 UNIT/GM ointment Apply 1 application. topically daily as needed (sore).   Yes [provider]  cyanocobalamin (,VITAMIN B-12,) 1000 MCG/ML  injection Inject 1,000 mcg into the muscle every 30 (thirty) days.   Yes [provider]  docusate sodium (COLACE) 100 MG capsule Take 200 mg by mouth 2 (two) times daily as needed for mild constipation.   Yes [provider]  finasteride (PROSCAR) 5 MG tablet Take 5 mg by mouth daily.   Yes [provider]  fluticasone (FLONASE) 50 MCG/ACT nasal spray Place 1 spray into both nostrils daily as needed for allergies.   Yes [provider]  levothyroxine (SYNTHROID) 175 MCG tablet Take 175 mcg by mouth daily before breakfast.  05/12/12  Yes [provider]  pantoprazole (PROTONIX) 40 MG tablet Take 40 mg by mouth daily.   Yes [provider]  polyethylene glycol (MIRALAX / GLYCOLAX) 17 g packet Take 17 g by mouth daily. Patient taking differently: Take 17 g by mouth daily as needed for mild constipation or moderate constipation. 06/14/20  Yes Regalado, Belkys A, MD  potassium chloride (KLOR-CON) 10 MEQ tablet Take 10 mEq by mouth  daily. 12/08/21  Yes [provider]  sertraline (ZOLOFT) 50 MG tablet Take 50 mg by mouth daily.   Yes [provider]  simvastatin (ZOCOR) 40 MG tablet Take 1 tablet (40 mg total) by mouth every evening. 11/07/21  Yes Strader, Fransisco Hertz, PA-C  spironolactone (ALDACTONE) 25 MG tablet Take 25 mg by mouth daily.   Yes [provider]  torsemide (DEMADEX) 100 MG tablet Take 1 tablet by mouth 2 (two) times daily. 03/26/21  Yes [provider]  TRINTELLIX 20 MG TABS tablet Take 20 mg by mouth daily. 03/19/21  Yes [provider]   Current Facility-Administered Medications  Medication Dose Route Frequency Provider Last Rate Last Admin   albuterol (PROVENTIL) (2.5 MG/3ML) 0.083% nebulizer solution 3 mL  3 mL Inhalation Q6H PRN Johnson, Clanford L, MD       ALPRAZolam Duanne Moron) tablet 0.25 mg  0.25 mg Oral QHS PRN Johnson, Clanford L, MD       aspirin EC tablet 81 mg  81 mg Oral Daily  Johnson, Clanford L, MD   81 mg at 01/02/22 0846   docusate sodium (COLACE) capsule 200 mg  200 mg Oral BID PRN Johnson, Clanford L, MD       finasteride (PROSCAR) tablet 5 mg  5 mg Oral Daily Johnson, Clanford L, MD   5 mg at 01/02/22 0846   fluticasone (FLONASE) 50 MCG/ACT nasal spray 1 spray  1 spray Each Nare Daily PRN Johnson, Clanford L, MD       heparin injection 5,000 Units  5,000 Units Subcutaneous Q8H Johnson, Clanford L, MD   5,000 Units at 01/02/22 9735   levothyroxine (SYNTHROID) tablet 175 mcg  175 mcg Oral QAC breakfast Wynetta Emery, Clanford L, MD   175 mcg at 01/02/22 0626   ondansetron (ZOFRAN) injection 4 mg  4 mg Intravenous Q6H PRN Johnson, Clanford L, MD   4 mg at 01/01/22 1501   pantoprazole (PROTONIX) EC tablet 40 mg  40 mg Oral Daily Johnson, Clanford L, MD   40 mg at 01/02/22 0846   polyethylene glycol (MIRALAX / GLYCOLAX) packet 17 g  17 g Oral Daily PRN Johnson, Clanford L, MD       potassium chloride (KLOR-CON M) CR tablet 10 mEq  10 mEq Oral Daily Johnson, Clanford L, MD   10 mEq at 01/02/22 0846   sertraline (ZOLOFT) tablet 50 mg  50 mg Oral Daily Johnson, Clanford L, MD   50 mg at 01/02/22 0846   simvastatin (ZOCOR) tablet 40 mg  40 mg Oral QPM Johnson, Clanford L, MD   40 mg at 01/01/22 1914   vortioxetine HBr (TRINTELLIX) tablet 20 mg  20 mg Oral Daily Johnson, Clanford L, MD         ROS: As per HPI otherwise negative.  Physical Exam: Vitals:   01/02/22 0026 01/02/22 0030 01/02/22 0441 01/02/22 0930  BP: 133/75  105/62 113/80  Pulse: (!) 52  94 72  Resp: '18  18 18  '$ Temp: 97.7 F (36.5 C)  98.2 F (36.8 C) 98.4 F (36.9 C)  TempSrc: Oral     SpO2: 97%  99% 100%  Weight:  95 kg    Height:  '5\' 8"'$  (1.727 m)       General: Appears comfortable, in no distress  Head: NCAT sclera not icteric MMM Neck: Supple. No JVD appreciated  Lungs: Clear bilaterally without wheezes, rales, or rhonchi.  Heart: RRR, no murmur, rub, or gallop  Abdomen: soft non-tender,  bowel sounds normal, PD cath in RLQ  Lower extremities:without edema or ischemic changes, no open wounds  Neuro: A & O X 2. Follows commands. Dysarthric speech  Skin: Warm, dry intact.  Dialysis Access: PD cath in place, dsg clean, no drainage   Labs: Basic Metabolic Panel: Recent Labs  Lab 01/01/22 1116 01/01/22 1911 01/02/22 0112  NA 133*  --  135  K 3.5  --  4.0  CL 95*  --  98  CO2 26  --  27  GLUCOSE 130*  --  124*  BUN 36*  --  36*  CREATININE 6.82* 7.10* 7.53*  CALCIUM 9.6  --  9.7   Liver Function Tests: Recent Labs  Lab 01/01/22 1116  AST 21  ALT 9  ALKPHOS 87  BILITOT 0.6  PROT 7.2  ALBUMIN 3.2*   No results for input(s): "LIPASE", "AMYLASE" in the last 168 hours. No results for input(s): "AMMONIA" in the last 168 hours. CBC: Recent Labs  Lab 01/01/22 1116 01/01/22 1911  WBC 9.0 9.0  NEUTROABS 5.8  --   HGB 14.0 13.9  HCT 40.4 40.4  MCV 98.1 98.5  PLT 243 255   Cardiac Enzymes: No results for input(s): "CKTOTAL", "CKMB", "CKMBINDEX", "TROPONINI" in the last 168 hours. CBG: No results for input(s): "GLUCAP" in the last 168 hours. Iron Studies: No results for input(s): "IRON", "TIBC", "TRANSFERRIN", "FERRITIN" in the last 72 hours. Studies/Results: No results found.  Dialysis Orders:  CCPD [facility] 5 cycles over 10 hours. 3L fill. Last fill icodextran 2L.  Outpatient PD RN Malachy Mood [phone]   Assessment/Plan: Dysphagia/poor intake - ongoing issue. Per primary. Dysphagia diet. To see SLP.  ESRD -  Continue CCPD. K 4.0. No signs of infection. Use all 1.5% bags for now.  Hypertension/volume  - Blood pressure ok. No gross volume on exam.  Anemia  - Hgb 13.9 No ESA needs  Metabolic bone disease -  Ca acceptable. Check Phos in the am  Nutrition - Dysphagia diet per RD H/o CVA  FTT/Weight loss - Significant weight loss and progressive weakness noted. Palliative care has been consulted for goals of care.   Lynnda Child  PA-C [facility] 01/02/2022, 9:55 AM

## 2022-01-03 ENCOUNTER — Observation Stay (HOSPITAL_COMMUNITY): Payer: Managed Care, Other (non HMO)

## 2022-01-03 DIAGNOSIS — I10 Essential (primary) hypertension: Secondary | ICD-10-CM | POA: Diagnosis not present

## 2022-01-03 DIAGNOSIS — Z7189 Other specified counseling: Secondary | ICD-10-CM | POA: Diagnosis not present

## 2022-01-03 DIAGNOSIS — Z515 Encounter for palliative care: Secondary | ICD-10-CM | POA: Diagnosis not present

## 2022-01-03 DIAGNOSIS — R1312 Dysphagia, oropharyngeal phase: Secondary | ICD-10-CM | POA: Diagnosis not present

## 2022-01-03 DIAGNOSIS — R112 Nausea with vomiting, unspecified: Secondary | ICD-10-CM | POA: Diagnosis not present

## 2022-01-03 DIAGNOSIS — N186 End stage renal disease: Secondary | ICD-10-CM | POA: Diagnosis not present

## 2022-01-03 LAB — RENAL FUNCTION PANEL
Albumin: 2.8 g/dL — ABNORMAL LOW (ref 3.5–5.0)
Anion gap: 15 (ref 5–15)
BUN: 41 mg/dL — ABNORMAL HIGH (ref 8–23)
CO2: 27 mmol/L (ref 22–32)
Calcium: 9.6 mg/dL (ref 8.9–10.3)
Chloride: 94 mmol/L — ABNORMAL LOW (ref 98–111)
Creatinine, Ser: 7.65 mg/dL — ABNORMAL HIGH (ref 0.61–1.24)
GFR, Estimated: 7 mL/min — ABNORMAL LOW (ref >60)
Glucose, Bld: 101 mg/dL — ABNORMAL HIGH (ref 70–99)
Phosphorus: 5.3 mg/dL — ABNORMAL HIGH (ref 2.5–4.6)
Potassium: 3.6 mmol/L (ref 3.5–5.1)
Sodium: 136 mmol/L (ref 135–145)

## 2022-01-03 LAB — CBC
HCT: 35.6 % — ABNORMAL LOW (ref 39.0–52.0)
Hemoglobin: 12.5 g/dL — ABNORMAL LOW (ref 13.0–17.0)
MCH: 33.9 pg (ref 26.0–34.0)
MCHC: 35.1 g/dL (ref 30.0–36.0)
MCV: 96.5 fL (ref 80.0–100.0)
Platelets: 197 10*3/uL (ref 150–400)
RBC: 3.69 MIL/uL — ABNORMAL LOW (ref 4.22–5.81)
RDW: 13.2 % (ref 11.5–15.5)
WBC: 8.7 10*3/uL (ref 4.0–10.5)
nRBC: 0 % (ref 0.0–0.2)

## 2022-01-03 MED ORDER — ONDANSETRON 4 MG PO TBDP
8.0000 mg | ORAL_TABLET | Freq: Three times a day (TID) | ORAL | Status: DC
  Administered 2022-01-03 – 2022-01-05 (×6): 8 mg via ORAL
  Filled 2022-01-03 (×6): qty 2

## 2022-01-03 MED ORDER — IOHEXOL 9 MG/ML PO SOLN
ORAL | Status: AC
  Administered 2022-01-03: 500 mL
  Filled 2022-01-03: qty 1000

## 2022-01-03 MED ORDER — DELFLEX-LC/2.5% DEXTROSE 394 MOSM/L IP SOLN: INTRAPERITONEAL | Status: DC

## 2022-01-03 NOTE — Progress Notes (Signed)
PD tx initation note:   Pre TX VS: T  97.68fBP 131/828mg, HR 76bpm,  Sats 97% RA,  RR 18, 0/10 pain  Pre TX weight: 95.3kg  PD treatment initiated via aseptic technique. Consent signed and in chart. Patient is sleeping No complaints of pain. No specimen collected. PD exit site clean, dry and intact. Gentamycin and new dressing applied. Bedside RN educated on PD machine and how to contact tech support when PD machine alarms.

## 2022-01-03 NOTE — Progress Notes (Signed)
Patient's CCPD continues to beep. Slow drain noted on the machine. Turned to the right side per wife's  suggestion. Patient was turned left side on his back and even sitting. Slow drain continues.

## 2022-01-03 NOTE — Consult Note (Addendum)
Palliative Medicine Inpatient Consult Note  Consulting Provider: Murlean Iba, MD  Reason for consult:   [place] Palliative Medicine Consult  Reason for Consult? patient/wife ready for goals of care discussions    01/03/2022  HPI:  Per intake H&P --> [name] is a 76 year old gentleman who is DNR with cerebrovascular disease status post prior CVA, ESRD on nightly PD, chronic dysphagia on pure diet, moderate MS and moderate TR, status post AVR with bioprosthetic valve, paroxysmal atrial flutter, coronary artery disease, history of PE, chronic constipation, hypothyroidism was sent to the emergency department by his GI physician for failure to thrive.    Palliative care has been asked to get involved in the setting of multiple chronic comorbid conditions to further address goals of care.  Clinical Assessment/Goals of Care:  *Please note that this is a verbal dictation therefore any spelling or grammatical errors are due to the "[place]" system interpretation.  I have reviewed medical records including EPIC notes, labs and imaging, received report from bedside RN, assessed the patient.    I met with [name] and his wife, [name] to further discuss diagnosis prognosis, GOC, EOL wishes, disposition and options.   I introduced Palliative Medicine as specialized medical care for people living with serious illness. It focuses on providing relief from the symptoms and stress of a serious illness. The goal is to improve quality of life for both the patient and the family.  Medical History Review and Understanding:  Reviewed [name]'s history of end-stage renal disease for which he is on peritoneal dialysis every evening, paroxysmal atrial fibrillation, coronary artery disease, dysphagia requiring pured diet, prior CVA, and moderate MS.  Social History:  [name] is from [place], [place].  He has been married 3 times and has been  married to his most recent wife [name] for the past 16 years.  He has 1 grown son from his first marriage and [name] has 1 grown son from her prior marriage.  [name] also raised a teenage boy who he also considers his son.  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  He is a man of faith and practices within the [facility] denomination  Functional and Nutritional State:  Prior to admission to the hospital [name] that had many weeks of functional decline per his wife this was in the setting of recurrent urinary tract infections.  She also shares that he has ongoing nausea which has precluded his ability to eat and drink.  At this time [name] helps with all basic activities of daily living though [name] is able to participate in feeding himself to some degree.  Palliative Symptoms:  Nausea-patient shares that there is nothing that precipitates this sometimes it happens before he eats sometimes after he eats and sometimes out of the blue.  I asked him if he had ever been on on around-the-clock antiemetic which she shares he has not.  We discussed that this will be a first step in regards to symptom management to see if we could better help his oral intake.  Failure to thrive-I will request the help of the dietitian to meet with [name] and to better establish what his nutritional needs are as well as any alternative supplementals that can be offered  Advance Directives:  [name]'s wife is his primary caregiver and [name].  Code Status:  [name] is an established DNAR/DNI CODE STATUS.  There is no desire for life-prolonging heroic measures to sustain  life.  Discussion:  Per discussion with both [name] and [name] at this point in time they would like to focus on symptomatic support.  [name] feels strongly that if [name] is able to eat and drink and has his nausea better managed that he will gain strength and his quality of life will improve.  We did review his chronic medical disease  processes as well as the stroke he had endured.  We discussed that if he should for any reason not be thriving and should continue to decline the idea of hospice care.  I described hospice as a service for patients who have a life expectancy of 6 months or less. The goal of hospice is the preservation of dignity and quality at the end phases of life. Under hospice care, the focus changes from curative to symptom relief.  [name] shares she is very knowledgeable of hospice care as she has had multiple loved ones enrolled in such services.  She expresses that her father had a wonderful experience at hospice of [facility].  We reviewed that given [place] tenuous health state it will be important for an outpatient palliative provider to follow-up to continue symptom management as well as goals of care conversations.  At this time patient's spouse would prefer hospice of [facility] though she shares there is a wait until July 25 to be seen and she is hopeful to be seen sooner.  I shared that I would reach out to her transitions of care team to see perhaps if there is anything which can be done to get him seen after discharge.  Discussed the importance of continued conversation with family and their  medical providers regarding overall plan of care and treatment options, ensuring decisions are within the context of the patients values and GOCs.  Decision Maker: [name] (Spouse): [phone] ([place])  SUMMARY OF RECOMMENDATIONS   DNAR/DNI  Have added Zofran around-the-clock prior to meals for nausea  I have requested a nutrition consult for failure to thrive  An honest and frank conversation was had regarding patient's medical disease process and the paths of palliative care versus hospice care for management  Appreciate transitions of care team looking into hospice of [facility] to verify if they could come out sooner than July 25 for an outpatient palliative care  assessment  Ongoing palliative care support until discharge  Code Status/Advance Care Planning: DNAR/DNI   Palliative Prophylaxis:  Aspiration, Bowel Regimen, Delirium Protocol, Frequent Pain Assessment, Oral Care, Palliative Wound Care, and Turn Reposition  Additional Recommendations (Limitations, Scope, Preferences): Continue current care with focus on symptom management  Psycho-social/Spiritual:  Desire for further Chaplaincy support: Yes, patient is [employer] Additional Recommendations: Discussion of chronic disease trajectory   Prognosis: Guarded has a high likelihood of 77-monthmortality given his declining physical state, poor nutritional state, multiple chronic disease comorbidities  Discharge Planning: Discharge to home once medically optimized, with outpatient palliative support  Vitals:   01/03/22 0520 01/03/22 0857  BP: 136/85 120/73  Pulse: 98 67  Resp: 18   Temp: 97.9 F (36.6 C) 98.1 F (36.7 C)  SpO2: 99% 100%    Intake/Output Summary (Last 24 hours) at 01/03/2022 1542 Last data filed at 01/03/2022 1300 Gross per 24 hour  Intake 520 ml  Output 0 ml  Net 520 ml   Last Weight  Most recent update: 01/02/2022 12:30 AM    Weight  95 kg (209 lb 7 oz)  Gen: Elderly Caucasian male in no acute distress HEENT: moist mucous membranes CV: Regular rate and irregular rhythm PULM: On room air breathing is even and nonlabored ABD: Slightly distended EXT: No edema Neuro: Alert and oriented x3 -slow in responses  PPS: 30%   This conversation/these recommendations were discussed with patient primary care team, Dr. [place] Pink  Billing based on MDM: High  Problems Addressed: One acute or chronic illness or injury that poses a threat to life or bodily function  Amount and/or Complexity of Data: Category 3:Discussion of management or test interpretation with external physician/other qualified health care professional/appropriate source (not separately  reported)  Risks: Decision not to resuscitate or to de-escalate care because of poor prognosis ______________________________________________________ [place] Team Team Cell Phone: [phone] Please utilize secure chat with additional questions, if there is no response within 30 minutes please call the above phone number  Palliative Medicine Team providers are available by phone from 7am to 7pm daily and can be reached through the team cell phone.  Should this patient require assistance outside of these hours, please call the patient's attending physician.

## 2022-01-03 NOTE — Progress Notes (Signed)
[facility] PROGRESS NOTE   TAHIR BLANK HQP:[id number] DOB: [birth year] DOA: 01/01/2022  PCP: Celene Squibb, MD  Brief History/Interval Summary: 76 year old gentleman who is DNR with cerebrovascular disease status post prior CVA, ESRD on nightly PD, chronic dysphagia on pure diet, moderate MS and moderate TR, status post AVR with bioprosthetic valve, paroxysmal atrial flutter, coronary artery disease, history of PE, chronic constipation, hypothyroidism was sent to the emergency department by his GI physician for failure to thrive.  Patient was just recently discharged from [facility] for treatment of peritonitis associated with his PD treatments.  He completed the antibiotic course and his peritoneal fluid has been clear according to wife.  He has had no fever and chills.  Unfortunately he has continued to decline with poor oral intake due to difficulty with swallowing which has been a chronic problem for him.  His wife has been trying to to arrange for outpatient palliative services however has not been successful.  Patient has continued to decline and now basically has become totally bedbound requiring assistance with all of his ADLs.  He has become severely dehydrated because he has not been able to eat or drink well due to his dysphagia.  Wife is requesting inpatient palliative consultation for goals of care and treatment of his acute dehydration.  Consultants: Palliative care.  Nephrology  Procedures: None    Subjective/Interval History: Patient remains distracted.  Continues to have nausea.  Had 1 episode of vomiting overnight.  Complaining of some abdominal discomfort this morning.     Assessment/Plan:  Intractable nausea and vomiting This is apparently a longstanding issue.  Thought to be related to his oropharyngeal dysphagia and other factors.  Was noted to be dehydrated.  Was hospitalized and he was given IV fluids.   Had 1 episode of vomiting overnight.  Mentions abdominal  discomfort this morning.  Slightly tender on examination.  Will do abdominal films today.   Last CT scan was a CT renal stone study done in May.  This did reveal diffuse bladder wall thickening with mild surrounding soft tissue haziness.  Gas noted within the wall of the urinary bladder.  No nephrolithiasis or hydronephrosis was noted.  Fecal impaction was present.  No other acute findings noted.  Looks like at that time he was treated with antibiotics.  Could consider repeating a CT scan while he is here.  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  Also has had a general decline in his health.  So appears to be multifactorial.  Seen by speech therapy.  Continue dysphagia 1 diet. Palliative medicine has been consulted for goals of care.  End-stage renal disease on peritoneal dialysis Nephrology is following.  Dehydration Secondary to dysphagia.  Was given IV fluids in the emergency department.  History of stroke On aspirin and statin.  Hypothyroidism Continue levothyroxine.  History of depression and anxiety Resume home medications.  Functional quadriplegia Wife reported that patient has become bedbound and totally dependent at home.  Palliative care has been consulted for goals of care.    Obesity  Estimated body mass index is 31.84 kg/m as calculated from the following:   Height as of this encounter: '5\' 8"'$  (1.727 m).   Weight as of this encounter: 95 kg.   DVT Prophylaxis: Subcutaneous heparin Code Status: DNR Family Communication: Discussed with patient.  No family at bedside.  Palliative care to meet with family this afternoon. Disposition Plan: To be determined  Status is: Observation The patient will  require care spanning > 2 midnights and should be moved to inpatient because: Failure to thrive, significant dysphagia, dehydration      Medications: Scheduled:  aspirin EC  81 mg Oral Daily   feeding supplement (NEPRO CARB STEADY)  237 mL Oral  BID BM   finasteride  5 mg Oral Daily   gentamicin cream  1 Application Topical Daily   heparin  5,000 Units Subcutaneous Q8H   levothyroxine  175 mcg Oral QAC breakfast   multivitamin  1 tablet Oral QHS   pantoprazole  40 mg Oral Daily   potassium chloride  10 mEq Oral Daily   sertraline  50 mg Oral Daily   simvastatin  40 mg Oral QPM   thiamine  100 mg Oral Daily   vortioxetine HBr  20 mg Oral Daily   Continuous: BHA:[id number], ALPRAZolam, docusate sodium, fluticasone, ondansetron (ZOFRAN) IV, polyethylene glycol  Antibiotics: Anti-infectives (From admission, onward)    None       Objective:  Vital Signs  Vitals:   01/02/22 2035 01/03/22 0015 01/03/22 0520 01/03/22 0857  BP: 107/64 131/80 136/85 120/73  Pulse: 96 76 98 67  Resp: '18 18 18   '$ Temp: 98.5 F (36.9 C) 97.7 F (36.5 C) 97.9 F (36.6 C) 98.1 F (36.7 C)  TempSrc: Oral Oral Oral Oral  SpO2: 93% 97% 99%   Weight:      Height:        Intake/Output Summary (Last 24 hours) at 01/03/2022 1017 Last data filed at 01/03/2022 0900 Gross per 24 hour  Intake 690 ml  Output 0 ml  Net 690 ml    Filed Weights   01/01/22 1020 01/02/22 0030  Weight: 102 kg 95 kg    General appearance: Awake alert.  In no distress.  Distracted Resp: Clear to auscultation bilaterally.  Normal effort Cardio: S1-S2 is normal regular.  No S3-S4.  No rubs murmurs or bruit GI: Abdomen is soft.  Mildly tender diffusely without any rebound rigidity or guarding.  Peritoneal dialysis catheter is noted. Extremities: No edema.  Full range of motion of lower extremities. Neurologic:  No focal neurological deficits.     Lab Results:  Data Reviewed: I have personally reviewed following labs and reports of the imaging studies  CBC: Recent Labs  Lab 01/01/22 1116 01/01/22 1911  WBC 9.0 9.0  NEUTROABS 5.8  --   HGB 14.0 13.9  HCT 40.4 40.4  MCV 98.1 98.5  PLT 243 255     Basic Metabolic Panel: Recent Labs  Lab 01/01/22 1116  01/01/22 1911 01/02/22 0112  NA 133*  --  135  K 3.5  --  4.0  CL 95*  --  98  CO2 26  --  27  GLUCOSE 130*  --  124*  BUN 36*  --  36*  CREATININE 6.82* 7.10* 7.53*  CALCIUM 9.6  --  9.7  MG  --   --  1.7     GFR: Estimated Creatinine Clearance: 9.3 mL/min (A) (by C-G formula based on SCr of 7.53 mg/dL (H)).  Liver Function Tests: Recent Labs  Lab 01/01/22 1116  AST 21  ALT 9  ALKPHOS 87  BILITOT 0.6  PROT 7.2  ALBUMIN 3.2*      Radiology Studies: No results found.     LOS: 0 days   Aryelle Figg [employer] on [email or web address]  01/03/2022, 10:17 AM

## 2022-01-03 NOTE — Progress Notes (Signed)
PD catheter had slow flow tonight, no lines kink, no fibrin noted. Patient repositioned and resumed treatment via aseptic technique.   Patient is alert, denies pain. PD exit site clean, dry and intact. Reported to Bedside RN to reposition patient when slow flow alarm is on.

## 2022-01-03 NOTE — Progress Notes (Signed)
PD machine continues to have slow drain alarms, patient repositioned but was ineffective. Attempted to aspirate PD catheter gently, unable to withdraw fluid. Dr. Posey Pronto notified, ordered to stop treatment.  Disconnected using aseptic technique. Patient denies pain. Report given to Bedside RN.

## 2022-01-03 NOTE — Progress Notes (Signed)
  [facility] Progress Note   Subjective: Seen in room. Still connected to PD cycler, unclear if machine issue last night. Dialysis RN to see.   Objective Vitals:   01/02/22 2035 01/03/22 0015 01/03/22 0520 01/03/22 0857  BP: 107/64 131/80 136/85 120/73  Pulse: 96 76 98 67  Resp: '18 18 18   '$ Temp: 98.5 F (36.9 C) 97.7 F (36.5 C) 97.9 F (36.6 C) 98.1 F (36.7 C)  TempSrc: Oral Oral Oral Oral  SpO2: 93% 97% 99%   Weight:      Height:        Additional Objective Labs: Basic Metabolic Panel: Recent Labs  Lab 01/01/22 1116 01/01/22 1911 01/02/22 0112  NA 133*  --  135  K 3.5  --  4.0  CL 95*  --  98  CO2 26  --  27  GLUCOSE 130*  --  124*  BUN 36*  --  36*  CREATININE 6.82* 7.10* 7.53*  CALCIUM 9.6  --  9.7   CBC: Recent Labs  Lab 01/01/22 1116 01/01/22 1911  WBC 9.0 9.0  NEUTROABS 5.8  --   HGB 14.0 13.9  HCT 40.4 40.4  MCV 98.1 98.5  PLT 243 255   Blood Culture    Component Value Date/Time   SDES IN/OUT CATH URINE 12/14/2021 0433   SPECREQUEST  12/14/2021 0433    NONE Performed at [facility], [place] [address] [place], [place] [address]    CULT 30,000 COLONIES/mL ESCHERICHIA COLI (A) 12/14/2021 0433   REPTSTATUS 12/16/2021 FINAL 12/14/2021 0433     Physical Exam General: Alert, oriented to self  Heart: RRR Lungs: Clear bilaterally  Abdomen: soft, non-tender  Extremities: No LE edema  Dialysis Access: PD cathter in place  Medications:   aspirin EC  81 mg Oral Daily   feeding supplement (NEPRO CARB STEADY)  237 mL Oral BID BM   finasteride  5 mg Oral Daily   gentamicin cream  1 Application Topical Daily   heparin  5,000 Units Subcutaneous Q8H   levothyroxine  175 mcg Oral QAC breakfast   multivitamin  1 tablet Oral QHS   pantoprazole  40 mg Oral Daily   potassium chloride  10 mEq Oral Daily   sertraline  50 mg Oral Daily   simvastatin  40 mg Oral QPM   thiamine  100 mg Oral Daily   vortioxetine HBr  20 mg Oral  Daily    Dialysis Orders:  [facility] 5 cycles over 10 hours. 3L fill. Last fill icodextran 2L.  Outpatient PD RN Malachy Mood [phone]    Assessment/Plan: Dysphagia/poor intake - ongoing issue. Per primary. Dysphagia diet. To see SLP.  ESRD -  Continue CCPD. K 4.0. No signs of infection. Use all 1.5% bags for now.  Hypertension/volume  - Blood pressure ok. No gross volume on exam.  Anemia  - Hgb 13.9 No ESA needs  Metabolic bone disease -  Ca acceptable. Check Phos in the am  Nutrition - Dysphagia diet per RD H/o CVA  FTT/Weight loss - Significant weight loss and progressive weakness noted. Palliative care has been consulted. Family meeting today.   Lynnda Child PA-C [facility] 01/03/2022,10:46 AM

## 2022-01-04 DIAGNOSIS — I120 Hypertensive chronic kidney disease with stage 5 chronic kidney disease or end stage renal disease: Secondary | ICD-10-CM | POA: Diagnosis present

## 2022-01-04 DIAGNOSIS — R532 Functional quadriplegia: Secondary | ICD-10-CM | POA: Diagnosis present

## 2022-01-04 DIAGNOSIS — E669 Obesity, unspecified: Secondary | ICD-10-CM | POA: Diagnosis present

## 2022-01-04 DIAGNOSIS — L89312 Pressure ulcer of right buttock, stage 2: Secondary | ICD-10-CM | POA: Diagnosis present

## 2022-01-04 DIAGNOSIS — E440 Moderate protein-calorie malnutrition: Secondary | ICD-10-CM | POA: Diagnosis present

## 2022-01-04 DIAGNOSIS — N186 End stage renal disease: Secondary | ICD-10-CM | POA: Diagnosis present

## 2022-01-04 DIAGNOSIS — I480 Paroxysmal atrial fibrillation: Secondary | ICD-10-CM | POA: Diagnosis present

## 2022-01-04 DIAGNOSIS — D649 Anemia, unspecified: Secondary | ICD-10-CM | POA: Diagnosis present

## 2022-01-04 DIAGNOSIS — Z515 Encounter for palliative care: Secondary | ICD-10-CM | POA: Diagnosis not present

## 2022-01-04 DIAGNOSIS — N2581 Secondary hyperparathyroidism of renal origin: Secondary | ICD-10-CM | POA: Diagnosis present

## 2022-01-04 DIAGNOSIS — E782 Mixed hyperlipidemia: Secondary | ICD-10-CM | POA: Diagnosis present

## 2022-01-04 DIAGNOSIS — R1115 Cyclical vomiting syndrome unrelated to migraine: Secondary | ICD-10-CM | POA: Diagnosis present

## 2022-01-04 DIAGNOSIS — F32A Depression, unspecified: Secondary | ICD-10-CM | POA: Diagnosis present

## 2022-01-04 DIAGNOSIS — Z953 Presence of xenogenic heart valve: Secondary | ICD-10-CM | POA: Diagnosis not present

## 2022-01-04 DIAGNOSIS — R1312 Dysphagia, oropharyngeal phase: Secondary | ICD-10-CM | POA: Diagnosis present

## 2022-01-04 DIAGNOSIS — R627 Adult failure to thrive: Secondary | ICD-10-CM | POA: Diagnosis present

## 2022-01-04 DIAGNOSIS — I69391 Dysphagia following cerebral infarction: Secondary | ICD-10-CM | POA: Diagnosis not present

## 2022-01-04 DIAGNOSIS — R112 Nausea with vomiting, unspecified: Secondary | ICD-10-CM | POA: Diagnosis not present

## 2022-01-04 DIAGNOSIS — I2510 Atherosclerotic heart disease of native coronary artery without angina pectoris: Secondary | ICD-10-CM | POA: Diagnosis present

## 2022-01-04 DIAGNOSIS — E039 Hypothyroidism, unspecified: Secondary | ICD-10-CM | POA: Diagnosis present

## 2022-01-04 DIAGNOSIS — E860 Dehydration: Secondary | ICD-10-CM | POA: Diagnosis present

## 2022-01-04 DIAGNOSIS — Z66 Do not resuscitate: Secondary | ICD-10-CM | POA: Diagnosis present

## 2022-01-04 DIAGNOSIS — I10 Essential (primary) hypertension: Secondary | ICD-10-CM | POA: Diagnosis not present

## 2022-01-04 DIAGNOSIS — J449 Chronic obstructive pulmonary disease, unspecified: Secondary | ICD-10-CM | POA: Diagnosis present

## 2022-01-04 DIAGNOSIS — E872 Acidosis, unspecified: Secondary | ICD-10-CM | POA: Diagnosis present

## 2022-01-04 DIAGNOSIS — Z992 Dependence on renal dialysis: Secondary | ICD-10-CM | POA: Diagnosis not present

## 2022-01-04 MED ORDER — ONDANSETRON 8 MG PO TBDP: 8.0000 mg | ORAL_TABLET | Freq: Three times a day (TID) | ORAL | 0 refills | Status: DC | PRN

## 2022-01-04 MED ORDER — ALTEPLASE 2 MG IJ SOLR
INTRAMUSCULAR | Status: AC
  Administered 2022-01-04: 2 mg
  Filled 2022-01-04: qty 2

## 2022-01-04 NOTE — Plan of Care (Signed)
  Problem: Education: Goal: Knowledge of General Education information will improve Description Including pain rating scale, medication(s)/side effects and non-pharmacologic comfort measures Outcome: Progressing   Problem: Health Behavior/Discharge Planning: Goal: Ability to manage health-related needs will improve Outcome: Progressing   

## 2022-01-04 NOTE — Progress Notes (Signed)
[facility] PROGRESS NOTE   [name] WJX:[id number] DOB: Mar 20, 1946 DOA: 01/01/2022  PCP: Celene Squibb, MD  Brief History/Interval Summary: 76 year old gentleman who is DNR with cerebrovascular disease status post prior CVA, ESRD on nightly PD, chronic dysphagia on pure diet, moderate MS and moderate TR, status post AVR with bioprosthetic valve, paroxysmal atrial flutter, coronary artery disease, history of PE, chronic constipation, hypothyroidism was sent to the emergency department by his GI physician for failure to thrive.  Patient was just recently discharged from [facility] for treatment of peritonitis associated with his PD treatments.  He completed the antibiotic course and his peritoneal fluid has been clear according to wife.  He has had no fever and chills.  Unfortunately he has continued to decline with poor oral intake due to difficulty with swallowing which has been a chronic problem for him.  His wife has been trying to to arrange for outpatient palliative services however has not been successful.  Patient has continued to decline and now basically has become totally bedbound requiring assistance with all of his ADLs.  He has become severely dehydrated because he has not been able to eat or drink well due to his dysphagia.  Wife is requesting inpatient palliative consultation for goals of care and treatment of his acute dehydration.  Consultants: Palliative care.  Nephrology  Procedures: None    Subjective/Interval History: Patient mentions that his nausea is improving.  Denies any abdominal pain this morning.  Remains distracted.     Assessment/Plan:  Intractable nausea and vomiting This is apparently a longstanding issue.  Thought to be related to his oropharyngeal dysphagia and other factors.  Was noted to be dehydrated.  Was hospitalized and he was given IV fluids.   CT scan of the abdomen pelvis done yesterday does not show any acute findings.  The abnormal  appearing bladder noted on CT renal stone study done in May not seen on this current CT.  This is reassuring. Symptoms appear to have improved.  He was started on around-the-clock ondansetron by palliative care yesterday.  Continue to monitor.  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  Also has had a general decline in his health.  So appears to be multifactorial.   Seen by speech therapy.  Continue dysphagia 1 diet. Seen by palliative care.  End-stage renal disease on peritoneal dialysis Nephrology is following.  Apparently some issue with peritoneal dialysis last night.  Nephrology to address today.  Dehydration Secondary to dysphagia.  Was given IV fluids in the emergency department.  History of stroke On aspirin and statin.  Hypothyroidism Continue levothyroxine.  History of depression and anxiety Resume home medications.  Functional quadriplegia Wife reported that patient has become bedbound and totally dependent at home.  Seen by palliative care.  Continued conversations regarding his care to be pursued in the outpatient setting   Obesity  Estimated body mass index is 31.84 kg/m as calculated from the following:   Height as of this encounter: '5\' 8"'$  (1.727 m).   Weight as of this encounter: 95 kg.   DVT Prophylaxis: Subcutaneous heparin Code Status: DNR Family Communication: Discussed with patient.  No family at bedside.  Palliative care discussed with his wife yesterday.  I will call his wife later today. Disposition Plan: Hopefully home tomorrow.  Status is: Observation The patient will require care spanning > 2 midnights and should be moved to inpatient because: Failure to thrive, significant dysphagia, dehydration      Medications:  Scheduled:  aspirin EC  81 mg Oral Daily   feeding supplement (NEPRO CARB STEADY)  237 mL Oral BID BM   finasteride  5 mg Oral Daily   gentamicin cream  1 Application Topical Daily   heparin  5,000 Units  Subcutaneous Q8H   levothyroxine  175 mcg Oral QAC breakfast   multivitamin  1 tablet Oral QHS   ondansetron  8 mg Oral TID AC   pantoprazole  40 mg Oral Daily   potassium chloride  10 mEq Oral Daily   sertraline  50 mg Oral Daily   simvastatin  40 mg Oral QPM   thiamine  100 mg Oral Daily   vortioxetine HBr  20 mg Oral Daily   Continuous:  dialysis solution 2.5% low-MG/low-CA     JKK:[id number], ALPRAZolam, docusate sodium, fluticasone, ondansetron (ZOFRAN) IV, polyethylene glycol  Antibiotics: Anti-infectives (From admission, onward)    None       Objective:  Vital Signs  Vitals:   01/03/22 1601 01/03/22 2046 01/04/22 0458 01/04/22 0742  BP: 136/79 122/67 107/73 121/71  Pulse: 75 81 71 66  Resp: '18 17 16   '$ Temp: 97.7 F (36.5 C) 98.6 F (37 C) 98 F (36.7 C) 97.7 F (36.5 C)  TempSrc: Oral Oral Oral Oral  SpO2: 100% 100% 98% 96%  Weight:      Height:        Intake/Output Summary (Last 24 hours) at 01/04/2022 0954 Last data filed at 01/04/2022 0600 Gross per 24 hour  Intake 150 ml  Output 0 ml  Net 150 ml    Filed Weights   01/01/22 1020 01/02/22 0030  Weight: 102 kg 95 kg    General appearance: Awake alert.  In no distress.  Remains distracted Resp: Clear to auscultation bilaterally.  Normal effort Cardio: S1-S2 is normal regular.  No S3-S4.  No rubs murmurs or bruit GI: Abdomen is soft.  No tenderness appreciated on palpation.  Peritoneal dialysis catheter noted. Extremities: No edema.  Full range of motion of lower extremities. Neurologic:  No focal neurological deficits.     Lab Results:  Data Reviewed: I have personally reviewed following labs and reports of the imaging studies  CBC: Recent Labs  Lab 01/01/22 1116 01/01/22 1911 01/03/22 1110  WBC 9.0 9.0 8.7  NEUTROABS 5.8  --   --   HGB 14.0 13.9 12.5*  HCT 40.4 40.4 35.6*  MCV 98.1 98.5 96.5  PLT 243 255 197     Basic Metabolic Panel: Recent Labs  Lab 01/01/22 1116  01/01/22 1911 01/02/22 0112 01/03/22 1110  NA 133*  --  135 136  K 3.5  --  4.0 3.6  CL 95*  --  98 94*  CO2 26  --  27 27  GLUCOSE 130*  --  124* 101*  BUN 36*  --  36* 41*  CREATININE 6.82* 7.10* 7.53* 7.65*  CALCIUM 9.6  --  9.7 9.6  MG  --   --  1.7  --   PHOS  --   --   --  5.3*     GFR: Estimated Creatinine Clearance: 9.2 mL/min (A) (by C-G formula based on SCr of 7.65 mg/dL (H)).  Liver Function Tests: Recent Labs  Lab 01/01/22 1116 01/03/22 1110  AST 21  --   ALT 9  --   ALKPHOS 87  --   BILITOT 0.6  --   PROT 7.2  --   ALBUMIN 3.2* 2.8*  Radiology Studies: CT ABDOMEN PELVIS WO CONTRAST  Result Date: 01/03/2022 CLINICAL DATA:  Abdominal pain, intractable nausea and vomiting. EXAM: CT ABDOMEN AND PELVIS WITHOUT CONTRAST TECHNIQUE: Multidetector CT imaging of the abdomen and pelvis was performed following the standard protocol without IV contrast. RADIATION DOSE REDUCTION: This exam was performed according to the departmental dose-optimization program which includes automated exposure control, adjustment of the mA and/or kV according to patient size and/or use of iterative reconstruction technique. COMPARISON:  11/19/2021. FINDINGS: Lower chest: The heart is enlarged and there is no pericardial effusion. Mild atelectasis is present at the lung bases. Hepatobiliary: No focal liver abnormality is seen. No gallstones, gallbladder wall thickening, or biliary dilatation. Pancreas: Unremarkable. No pancreatic ductal dilatation or surrounding inflammatory changes. Spleen: Normal in size without focal abnormality. Adrenals/Urinary Tract: The adrenal glands are within normal limits. No renal calculus or hydronephrosis. The bladder is unremarkable. Stomach/Bowel: There is a small hiatal hernia. The stomach is otherwise within normal limits. No free air or pneumatosis. Scattered diverticula are present along the colon without evidence of diverticulitis. Appendix appears normal. No  evidence of bowel wall thickening, distention, or inflammatory changes. Vascular/Lymphatic: Aortic atherosclerosis. 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 No enlarged abdominal or pelvic lymph nodes. Reproductive: Prostate is unremarkable. Other: A peritoneal dialysis catheter is noted. A trace amount of free fluid is noted in the perihepatic space. A fat containing umbilical hernia is noted. Fat containing inguinal hernias are noted bilaterally. Musculoskeletal: Sternotomy wires are present over the midline. Degenerative changes are present in the thoracolumbar spine. No acute osseous abnormality. IMPRESSION: 1. No acute intra-abdominal process. 2. Small hiatal hernia. 3. Trace amount of perihepatic free fluid with peritoneal dialysis catheter in place. 4. Cardiomegaly with coronary artery calcifications. 5. Aortic atherosclerosis. Electronically Signed   By: Brett Fairy M.D.   On: 01/03/2022 20:28       LOS: 0 days   [facility] Pager on [email or web address]  01/04/2022, 9:54 AM

## 2022-01-04 NOTE — Progress Notes (Signed)
Palliative Medicine Inpatient Follow Up Note   HPI: [name] is a 76 year old gentleman who is DNR with cerebrovascular disease status post prior CVA, ESRD on nightly PD, chronic dysphagia on pure diet, moderate MS and moderate TR, status post AVR with bioprosthetic valve, paroxysmal atrial flutter, coronary artery disease, history of PE, chronic constipation, hypothyroidism was sent to the emergency department by his GI physician for failure to thrive.     Palliative care has been asked to get involved in the setting of multiple chronic comorbid conditions to further address goals of care.  Today's Discussion 01/04/2022  *Please note that this is a verbal dictation therefore any spelling or grammatical errors are due to the "[employer]" system interpretation.  Chart reviewed inclusive of vital signs, progress notes, laboratory results, and diagnostic images.   I met with [name]'s nurse at bedside this morning.  He shares with me that unfortunately peritoneal dialysis could not be completed yesterday due to the lack of fluid flow.  We reviewed that this is the second time whereby patient has not been able to get PD during hospitalization.  I met with [name] at bedside this morning and he shares that he was able to sleep throughout the night and actually has no nausea.  We discussed the continuation of the plan for around-the-clock Zofran.Created space and opportunity for patient to explore thoughts feelings and fears regarding current medical situation.   We discussed that nephrology is actively involved in [name] case and further investigation will be held in terms of his lack of ability to receive peritoneal dialysis to identify if anything further can be done.  I shared that I would request the nephrology team reaches out to [name]'s wife to provide a more formalized update.  Questions and concerns addressed   Palliative Support Provided  Objective Assessment: Vital  Signs Vitals:   01/04/22 0458 01/04/22 0742  BP: 107/73 121/71  Pulse: 71 66  Resp: 16   Temp: 98 F (36.7 C) 97.7 F (36.5 C)  SpO2: 98% 96%    Intake/Output Summary (Last 24 hours) at 01/04/2022 0851 Last data filed at 01/04/2022 0600 Gross per 24 hour  Intake 270 ml  Output 0 ml  Net 270 ml   Last Weight  Most recent update: 01/02/2022 12:30 AM    Weight  95 kg (209 lb 7 oz)            Gen: Elderly Caucasian male in no acute distress HEENT: moist mucous membranes CV: Regular rate and irregular rhythm PULM: On room air breathing is even and nonlabored ABD: Slightly distended EXT: No edema Neuro: Alert and oriented x3 -slow in responses  SUMMARY OF RECOMMENDATIONS   DNAR/DNI  [name] has been unable to receive peritoneal dialysis--> appreciate nephrology assisting with conversations pertaining to what can be done and/or offered given this active problem   Continue Zofran around-the-clock prior to meals for nausea   I have requested a nutrition consult for failure to thrive   An honest and frank conversation was had regarding patient's medical disease process and the paths of palliative care versus hospice care for management   Appreciate transitions of care team looking into hospice of [facility] to verify if they could come out sooner than July 25 for an outpatient palliative care assessment   Ongoing palliative care support until discharge  Billing based on MDM: High ______________________________________________________________________________________ [place] Team Team Cell Phone: [phone] Please utilize secure chat with  additional questions, if there is no response within 30 minutes please call the above phone number  Palliative Medicine Team providers are available by phone from 7am to 7pm daily and can be reached through the team cell phone.  Should this patient require assistance outside of these hours,  please call the patient's attending physician.

## 2022-01-04 NOTE — Progress Notes (Signed)
[facility] Progress Note   Subjective: Unable to complete treatment last night. Slow drains then unable to drain from catheter. CT Abd was unremarkable. Spoke with his wife, [name] and she reports that they have had similar issues at home in setting of his significant weight loss. She states it helps to position him on his right side during drain.    Objective Vitals:   01/03/22 1601 01/03/22 2046 01/04/22 0458 01/04/22 0742  BP: 136/79 122/67 107/73 121/71  Pulse: 75 81 71 66  Resp: '18 17 16   '$ Temp: 97.7 F (36.5 C) 98.6 F (37 C) 98 F (36.7 C) 97.7 F (36.5 C)  TempSrc: Oral Oral Oral Oral  SpO2: 100% 100% 98% 96%  Weight:      Height:        Additional Objective Labs: Basic Metabolic Panel: Recent Labs  Lab 01/01/22 1116 01/01/22 1911 01/02/22 0112 01/03/22 1110  NA 133*  --  135 136  K 3.5  --  4.0 3.6  CL 95*  --  98 94*  CO2 26  --  27 27  GLUCOSE 130*  --  124* 101*  BUN 36*  --  36* 41*  CREATININE 6.82* 7.10* 7.53* 7.65*  CALCIUM 9.6  --  9.7 9.6  PHOS  --   --   --  5.3*    CBC: Recent Labs  Lab 01/01/22 1116 01/01/22 1911 01/03/22 1110  WBC 9.0 9.0 8.7  NEUTROABS 5.8  --   --   HGB 14.0 13.9 12.5*  HCT 40.4 40.4 35.6*  MCV 98.1 98.5 96.5  PLT 243 255 197    Blood Culture    Component Value Date/Time   SDES IN/OUT CATH URINE 12/14/2021 0433   SPECREQUEST  12/14/2021 0433    NONE Performed at [facility], [place] [address] [place], [place] [address]    CULT 30,000 COLONIES/mL ESCHERICHIA COLI (A) 12/14/2021 0433   REPTSTATUS 12/16/2021 FINAL 12/14/2021 0433     Physical Exam General: Alert, oriented to self  Heart: RRR Lungs: Clear bilaterally  Abdomen: soft, non-tender  Extremities: No LE edema  Dialysis Access: PD cathter in place  Medications:  dialysis solution 2.5% low-MG/low-CA      aspirin EC  81 mg Oral Daily   feeding supplement (NEPRO CARB STEADY)  237 mL Oral BID BM   finasteride  5 mg Oral  Daily   gentamicin cream  1 Application Topical Daily   heparin  5,000 Units Subcutaneous Q8H   levothyroxine  175 mcg Oral QAC breakfast   multivitamin  1 tablet Oral QHS   ondansetron  8 mg Oral TID AC   pantoprazole  40 mg Oral Daily   potassium chloride  10 mEq Oral Daily   sertraline  50 mg Oral Daily   simvastatin  40 mg Oral QPM   thiamine  100 mg Oral Daily   vortioxetine HBr  20 mg Oral Daily    Dialysis Orders:  CCPD [facility] 5 cycles over 10 hours. 3L fill. Last fill icodextran 2L.  Outpatient PD RN Malachy Mood [phone]    Assessment/Plan: Dysphagia/poor intake/nausea  - ongoing issue. Per primary. Dysphagia diet.  ESRD -  Continue CCPD. No signs of peritonitis. Mix 1.5%, 2.5%  bags for now. No day dwell here. Unable to completed treatment last night d/t slow drain from cathter. Will re-attempt with activase dwell and reposition as his wife has suggested. They have had similar issues at home.  Hypertension/volume  -  Blood pressure ok. No gross volume on exam.  Anemia  - Hgb >12 No ESA needs  Metabolic bone disease -  Corr Ca slightly elevated/Phos ok. No binders  Nutrition - Dysphagia diet per RD H/o CVA  FTT/Weight loss - Significant weight loss and progressive weakness noted. Palliative care has been consulted. Guarded prognosis. DNR. Will return to home -continue PD.   Lynnda Child PA-C [facility] 01/04/2022,9:15 AM

## 2022-01-04 NOTE — Plan of Care (Signed)
  Problem: Education: Goal: Knowledge of General Education information will improve Description Including pain rating scale, medication(s)/side effects and non-pharmacologic comfort measures Outcome: Progressing   

## 2022-01-05 DIAGNOSIS — N186 End stage renal disease: Secondary | ICD-10-CM | POA: Diagnosis not present

## 2022-01-05 DIAGNOSIS — Z992 Dependence on renal dialysis: Secondary | ICD-10-CM | POA: Diagnosis not present

## 2022-01-05 MED ORDER — ORAL CARE MOUTH RINSE
15.0000 mL | OROMUCOSAL | Status: DC | PRN
Start: 2022-01-05 — End: 2022-01-06

## 2022-01-05 MED ORDER — DELFLEX-LC/1.5% DEXTROSE 344 MOSM/L IP SOLN: INTRAPERITONEAL | Status: DC

## 2022-01-05 MED ORDER — ORAL CARE MOUTH RINSE
15.0000 mL | OROMUCOSAL | Status: DC
  Administered 2022-01-05: 15 mL via OROMUCOSAL

## 2022-01-05 MED ORDER — GENTAMICIN SULFATE 0.1 % EX CREA
1.0000 | TOPICAL_CREAM | Freq: Every day | CUTANEOUS | Status: DC
  Filled 2022-01-05: qty 15

## 2022-01-05 NOTE — Progress Notes (Signed)
PIV removed. AVS reviewed with patient and wife at bedside. Updated both regarding plan for PTAR to transport patient home.

## 2022-01-05 NOTE — TOC Transition Note (Addendum)
Transition of Care [facility]) - CM/SW Discharge Note   Patient Details  Name: [name] MRN: [id number] Date of Birth: 03/26/1946  [facility] CM/SW Contact:  Tom-Johnson, Renea Ee, RN Phone Number: 01/05/2022, 2:50 PM   Clinical Narrative:     Patient is scheduled for discharge today. Admitted for Intractable N/V. Recently discharged from the hospital on 01/16/22. Patient is on Peritoneal dialysis at home.  From home with wife, [name] who is his primary caretaker. Has two supportive children. Has all necessary DME's at home. Active with [facility] for home health disciplines. Caryl Pina notified to resume care at discharge, info on AVS.  [facility] was to start Outpatient Palliative on 07/25. CM notified [facility] [place] of admission.   PTAR scheduled for transport at discharge. No further TOC needs noted.  Final next level of care: [place] (Palliative outpatient.) Barriers to Discharge: Barriers Resolved   Patient Goals and CMS Choice Patient states their goals for this hospitalization and ongoing recovery are:: To return home CMS Medicare.gov Compare Post Acute Care list provided to:: Patient Choice offered to / list presented to : Patient, Spouse  Discharge Placement                Patient to be transferred to facility by: PTAR      Discharge Plan and Services                DME Arranged: N/A DME Agency: NA       HH Arranged: PT, OT, Speech Therapy [place] Agency: [place] ([facility]), [facility] (Outpatient Palliative.) Date [place]: 01/05/22 Time [place]: 1440 Representative spoke with at [place]: Caryl Pina with [facility] and [place] with [facility].  Social Determinants of Health (SDOH) Interventions     Readmission Risk Interventions    12/15/2021    4:03 PM  Readmission Risk Prevention Plan  Transportation Screening Complete  PCP or Specialist  Appt within 3-5 Days Complete  HRI or [place] Complete  Social Work Consult for [place] Planning/Counseling Complete  Palliative Care Screening Not Applicable  Medication Review Press photographer) Complete

## 2022-01-05 NOTE — Progress Notes (Signed)
Advised pt is a PD pt at progression this am. Pt receives his PD care at [facility]. Spoke to [place] clinic this am and advised that their PD RN on vacation this week and Stanton Kidney, PD RN, at [employer] is providing coverage this week. Contacted [place] and spoke to [place], [place] RN. Stanton Kidney advised that pt is for d/c later today if PD cath works appropriately. Stanton Kidney will f/u with pt tomorrow. Navigator to fax d/c summary and last renal note to clinic for continuation of care once summary completed.   Melven Sartorius Renal Navigator [phone]

## 2022-01-05 NOTE — Progress Notes (Signed)
[facility] Progress Note   Subjective: Spoke with his wife again, she reports that they have had similar issues at home in setting of his significant weight loss. She states it helps to position him on his right side during drain.    Objective Vitals:   01/04/22 0742 01/04/22 2146 01/05/22 0456 01/05/22 1054  BP: 121/71 112/70 114/70 118/75  Pulse: 66 68 67 75  Resp: '18 16 18 19  '$ Temp: 97.7 F (36.5 C) 98.4 F (36.9 C) 98.6 F (37 C) 98.4 F (36.9 C)  TempSrc: Oral   Oral  SpO2: 96% 95% 94% 98%  Weight:      Height:        Additional Objective Labs: Basic Metabolic Panel: Recent Labs  Lab 01/01/22 1116 01/01/22 1911 01/02/22 0112 01/03/22 1110  NA 133*  --  135 136  K 3.5  --  4.0 3.6  CL 95*  --  98 94*  CO2 26  --  27 27  GLUCOSE 130*  --  124* 101*  BUN 36*  --  36* 41*  CREATININE 6.82* 7.10* 7.53* 7.65*  CALCIUM 9.6  --  9.7 9.6  PHOS  --   --   --  5.3*    CBC: Recent Labs  Lab 01/01/22 1116 01/01/22 1911 01/03/22 1110  WBC 9.0 9.0 8.7  NEUTROABS 5.8  --   --   HGB 14.0 13.9 12.5*  HCT 40.4 40.4 35.6*  MCV 98.1 98.5 96.5  PLT 243 255 197    Blood Culture    Component Value Date/Time   SDES IN/OUT CATH URINE 12/14/2021 0433   SPECREQUEST  12/14/2021 0433    NONE Performed at [facility], [place] [address], [place], [place] [address]    CULT 30,000 COLONIES/mL ESCHERICHIA COLI (A) 12/14/2021 0433   REPTSTATUS 12/16/2021 FINAL 12/14/2021 0433     Physical Exam General: Alert, oriented to self  Heart: RRR Lungs: Clear bilaterally  Abdomen: soft, non-tender  Extremities: No LE edema  Dialysis Access: PD cathter in place  Medications:  dialysis solution 1.5% low-MG/low-CA      aspirin EC  81 mg Oral Daily   feeding supplement (NEPRO CARB STEADY)  237 mL Oral BID BM   finasteride  5 mg Oral Daily   gentamicin cream  1 Application Topical Daily   heparin  5,000 Units Subcutaneous Q8H   levothyroxine  175 mcg  Oral QAC breakfast   multivitamin  1 tablet Oral QHS   ondansetron  8 mg Oral TID AC   mouth rinse  15 mL Mouth Rinse 4 times per day   pantoprazole  40 mg Oral Daily   potassium chloride  10 mEq Oral Daily   sertraline  50 mg Oral Daily   simvastatin  40 mg Oral QPM   thiamine  100 mg Oral Daily   vortioxetine HBr  20 mg Oral Daily    Dialysis Orders:  CCPD [facility] 5 cycles over 10 hours. 3L fill. Last fill icodextran 2L.  Outpatient PD RN Malachy Mood [phone]    Assessment/Plan: Dysphagia/poor intake/nausea  - ongoing issue. Per primary. Dysphagia diet.  ESRD -  Continue CCPD. No signs of peritonitis. Mix 1.5%, 2.5%  bags for now. No day dwell here. Unable to complete treatment here due to trouble draining. SP TPA to PD cath yesterday and then again w/ overnight dwell. RN today reports unable to pull. However his wife reports that they have had similar drainage issues  at home in setting of his significant weight loss. She states it helps to position him on his right side during drain. I'm not sure there is any fluid in the abdomen. Will do a 2 liter manual exchange today to see if the PD cath is functioning. No abd pain or tenderness, doubt peritonitis.  Hypertension/volume  - Blood pressure ok. No gross volume on exam.  Anemia  - Hgb >12 No ESA needs  Metabolic bone disease -  Corr Ca slightly elevated/Phos ok. No binders  Nutrition - Dysphagia diet per RD H/o CVA  FTT/Weight loss - Significant weight loss and progressive weakness noted. Palliative care has been consulted. Guarded prognosis. DNR. Will return to home -continue PD.  Dispo - per pts wife hoping to dc home later today. If we can prove that PD cath is not clogged w/ a successful manual exchange today, then he can be dc'd from our standpoint.   Kelly Splinter, MD 01/05/2022, 12:57 PM

## 2022-01-05 NOTE — Evaluation (Signed)
Physical Therapy Evaluation Patient Details Name: [name] MRN: [id number] DOB: 1945-10-27 Today's Date: 01/05/2022  History of Present Illness  76 year old gentleman who was sent to the emergency department 01/01/22 by his GI physician for failure to thrive. Recent decline after peritonitis with poor intake, +dehydration; intractable nausea and vomiting;  PMH-- DNR with cerebrovascular disease status post prior CVA, ESRD on nightly PD, chronic dysphagia on pure diet, moderate MS and moderate TR, status post AVR, paroxysmal atrial flutter, coronary artery disease, history of PE  Clinical Impression   Pt admitted secondary to problem above with deficits below. PTA patient was essentially bedbound with ability to sit on EOB with HHPT. They were working on balance and core strengthening per wife.  Pt currently requires mod assist to sit at EOB with poor sitting balance. Able to progress to standing with mod assist (nearly full standing). Overall, pt  would not be able to tolerate 3 hours/day of intensive rehab at [facility]. Discussed option of rehab at [facility] and wife is not interested in that option. Can benefit from HHPT to continue to work towards incr independence with [place]. Anticipate patient will benefit from PT to address problems listed below.Will continue to follow acutely to maximize functional mobility independence and safety.          Recommendations for follow up therapy are one component of a multi-disciplinary discharge planning process, led by the attending physician.  Recommendations may be updated based on patient status, additional functional criteria and insurance authorization.  Follow Up Recommendations Home health PT      Assistance Recommended at Discharge Frequent or constant Supervision/Assistance  Patient can return home with the following  A lot of help with bathing/dressing/bathroom;Assistance with cooking/housework;Assistance with feeding;Direct supervision/assist for  medications management;Direct supervision/assist for financial management;Assist for transportation;Help with stairs or ramp for entrance;Two people to help with walking and/or transfers    Equipment Recommendations None recommended by PT;Other (comment) (will need medical transport home)  Recommendations for Other Services       Functional Status Assessment Patient has had a recent decline in their functional status and demonstrates the ability to make significant improvements in function in a reasonable and predictable amount of time.     Precautions / Restrictions Precautions Precautions: Fall      Mobility  Bed Mobility Overal bed mobility: Needs Assistance Bed Mobility: Rolling, Supine to Sit, Sit to Sidelying Rolling: Min guard (to left)   Supine to sit: Mod assist   Sit to sidelying: Min assist General bed mobility comments: able to exit to his rt and get legs over EOB, assist to raise torso, then assist to maintain sitting balance; return to rt sidelying with vc and assist for LEs    Transfers Overall transfer level: Needs assistance Equipment used: None Transfers: Sit to/from Stand Sit to Stand: Mod assist           General transfer comment: stood x 2 reps with ~3/4 standing (needing assist to further raise torso); +BM and further  mobility deferred    Ambulation/Gait               General Gait Details: non-ambulatory since CVA  Stairs            Wheelchair Mobility    Modified Rankin (Stroke Patients Only)       Balance Overall balance assessment: Needs assistance Sitting-balance support: Bilateral upper extremity supported, Feet supported Sitting balance-Leahy Scale: Poor Sitting balance - Comments: tends to lean to left or posteriorly  with decr awareness; requires multi-modal cues to achieve midline and can maintian with minguard assist x 30 seconds   Standing balance support: Bilateral upper extremity supported Standing balance-Leahy  Scale: Poor Standing balance comment: nearly full standing                             Pertinent Vitals/Pain Pain Assessment Pain Assessment: No/denies pain    Home Living Family/patient expects to be discharged to:: Private residence Living Arrangements: Spouse/significant other Available Help at Discharge: Family;Personal care attendant (caregiver in/out  throughout the day (they live next door); wife works from home) Type of Home: House Home Access: Stairs to enter Entrance Stairs-Rails: None Technical brewer of Steps: 2   Home Layout: One level Home Equipment: Conservation officer, nature (2 wheels);Rollator (4 wheels);Hospital bed;Tub bench;Wheelchair - manual;BSC/3in1;Electric scooter      Prior Function Prior Level of Function : Needs assist             Mobility Comments: last went home by medical transport due to weakness and stairs; was working on sitting balance with HHPT ADLs Comments: assist with bathing and dressing; needs supervision for eating due to dysphagia     Hand Dominance   Dominant Hand: Right    Extremity/Trunk Assessment   Upper Extremity Assessment Upper Extremity Assessment: LUE deficits/detail RUE Deficits / Details: AROM WFL; biceps 3+, triceps 4 RUE Coordination: decreased fine motor    Lower Extremity Assessment Lower Extremity Assessment: LLE deficits/detail LLE Deficits / Details: AROM WFL; hip flexion 3+, knee extension 4 LLE Sensation: history of peripheral neuropathy    Cervical / Trunk Assessment Cervical / Trunk Assessment: Kyphotic  Communication   Communication: Expressive difficulties  Cognition Arousal/Alertness: Awake/alert Behavior During Therapy: WFL for tasks assessed/performed Overall Cognitive Status: Impaired/Different from baseline Area of Impairment: Attention, Following commands, Safety/judgement, Problem solving, Awareness                   Current Attention Level: Sustained   Following  Commands: Follows one step commands with increased time Safety/Judgement: Decreased awareness of safety, Decreased awareness of deficits Awareness: Intellectual Problem Solving: Slow processing, Decreased initiation, Difficulty sequencing, Requires tactile cues, Requires verbal cues General Comments: pt with decr awareness of losing his balance when in sitting with no righting reactions; can correct with cues (verbal and tactile)        General Comments General comments (skin integrity, edema, etc.): Wife present throughout session and provided home set-up and prior functional status    Exercises     Assessment/Plan    PT Assessment Patient needs continued PT services  PT Problem List Decreased strength;Decreased activity tolerance;Decreased balance;Decreased mobility;Decreased coordination;Decreased cognition;Decreased knowledge of use of DME;Decreased safety awareness;Impaired sensation       PT Treatment Interventions DME instruction;Gait training;Stair training;Functional mobility training;Therapeutic activities;Therapeutic exercise;Balance training;Cognitive remediation;Patient/family education;Neuromuscular re-education    PT Goals (Current goals can be found in the Care Plan section)  Acute Rehab PT Goals Patient Stated Goal: get stronger PT Goal Formulation: With patient/family Time For Goal Achievement: 01/19/22 Potential to Achieve Goals: Fair    Frequency Min 3X/week     Co-evaluation               AM-PAC PT "6 Clicks" Mobility  Outcome Measure Help needed turning from your back to your side while in a flat bed without using bedrails?: A Little Help needed moving from lying on your back to sitting on the side of  a flat bed without using bedrails?: A Lot Help needed moving to and from a bed to a chair (including a wheelchair)?: Total Help needed standing up from a chair using your arms (e.g., wheelchair or bedside chair)?: Total Help needed to walk in  hospital room?: Total Help needed climbing 3-5 steps with a railing? : Total 6 Click Score: 9    End of Session Equipment Utilized During Treatment: Gait belt Activity Tolerance: Patient tolerated treatment well Patient left: in bed;with call bell/phone within reach;with bed alarm set;with family/visitor present;with nursing/sitter in room Nurse Communication: Mobility status;Other (comment) (need to be cleaned up) PT Visit Diagnosis: Muscle weakness (generalized) (M62.81);Other symptoms and signs involving the nervous system (R29.898)    Time: 8638-1771 PT Time Calculation (min) (ACUTE ONLY): 28 min   Charges:   PT Evaluation $PT Eval Moderate Complexity: 1 Mod PT Treatments $Therapeutic Activity: 8-22 mins         Arby Barrette, PT Acute Rehabilitation Services  Office [phone]   Rexanne Mano 01/05/2022, 2:39 PM

## 2022-01-05 NOTE — Progress Notes (Signed)
Speech Language Pathology Treatment: Dysphagia  Patient Details Name: [name] MRN: [id number] DOB: 1945-08-27 Today's Date: 01/05/2022 Time: 7893-8101 SLP Time Calculation (min) (ACUTE ONLY): 19 min  Assessment / Plan / Recommendation Clinical Impression  Daughter at bedside for swallow intervention and educated re: recommended strategies. He does not have pharyngeal congestion today and able to recall the chin tuck but max multimodal assist for follow through. Use of a straw with head already in a tucked position was beneficial and required fewer cues. He is edentulous and masticated Dys 3 texture with mild delays and reduced mandibular movement. Daughter reports she minces and sometimes blends his meat and at times he will chew swallowing the juice then expectorate the meat. One immediate cough when chin was not tucked. Will upgrade and trial him with a Dys 2 texture (from puree), continue nectar thick, crush meds and needs full assistance for feeding and to implement chin tuck strategy. ST will continue to follow.    HPI HPI: [name] is a 76 y.o. male with ESRD on CCPD, AS s/p AVR, CAD s/p CAGB, COPD, prior CVA, depression. Recent [facility] admission 6/17 -12/17/21  with PD cathter related peritonitis. Per chart he was brought to the ED by family for evaluation of worsening dysphagia and poor oral intake and progressive weakness and family are requesting palliative care consult per chart. Found to be dehydrated. MBS 12/16/21 aspirates thin and nectar when head extended and neutral position improves tolerance of nectar with straw and chin tuck prevents aspiraiton. Recommended puree diet and nectar thick liquids.      SLP Plan  Continue with current plan of care      Recommendations for follow up therapy are one component of a multi-disciplinary discharge planning process, led by the attending physician.  Recommendations may be updated based on patient status, additional functional criteria  and insurance authorization.    Recommendations  Diet recommendations: Dysphagia 2 (fine chop);Nectar-thick liquid Liquids provided via: Cup;Straw Medication Administration: Crushed with puree Supervision: Staff to assist with self feeding;Full supervision/cueing for compensatory strategies Compensations: Slow rate;Chin tuck;Use straw to facilitate chin tuck Postural Changes and/or Swallow Maneuvers: Seated upright 90 degrees                Oral Care Recommendations: Oral care BID Follow Up Recommendations: Home health SLP Assistance recommended at discharge: Frequent or constant Supervision/Assistance SLP Visit Diagnosis: Dysphagia, unspecified (R13.10) Plan: Continue with current plan of care           Houston Siren  01/05/2022, 11:48 AM

## 2022-01-05 NOTE — Progress Notes (Signed)
[facility] PROGRESS NOTE   RENNER SEBALD PJA:[id number] DOB: 19-Mar-1946 DOA: 01/01/2022  PCP: Celene Squibb, MD  Brief History/Interval Summary: 76 year old gentleman who is DNR with cerebrovascular disease status post prior CVA, ESRD on nightly PD, chronic dysphagia on pure diet, moderate MS and moderate TR, status post AVR with bioprosthetic valve, paroxysmal atrial flutter, coronary artery disease, history of PE, chronic constipation, hypothyroidism was sent to the emergency department by his GI physician for failure to thrive.  Patient was just recently discharged from [facility] for treatment of peritonitis associated with his PD treatments.  He completed the antibiotic course and his peritoneal fluid has been clear according to wife.  He has had no fever and chills.  Unfortunately he has continued to decline with poor oral intake due to difficulty with swallowing which has been a chronic problem for him.  His wife has been trying to to arrange for outpatient palliative services however has not been successful.  Patient has continued to decline and now basically has become totally bedbound requiring assistance with all of his ADLs.  He has become severely dehydrated because he has not been able to eat or drink well due to his dysphagia.  Wife is requesting inpatient palliative consultation for goals of care and treatment of his acute dehydration.  Consultants: Palliative care.  Nephrology  Procedures: Peritoneal dialysis    Subjective/Interval History: Patient mentions that he is feeling well.  No abdominal pain nausea or vomiting.     Assessment/Plan:  Intractable nausea and vomiting This is apparently a longstanding issue.  Thought to be related to his oropharyngeal dysphagia and other factors.  Was noted to be dehydrated.  Was hospitalized and he was given IV fluids.   CT scan of the abdomen pelvis done on 7/8 does not show any acute findings.  The abnormal appearing bladder  noted on CT renal stone study done in May not seen on this current CT.  This is reassuring. Symptoms appear to have improved.  He was started on around-the-clock ondansetron by palliative care.  Continue to monitor.  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  Also has had a general decline in his health.  So appears to be multifactorial.   Seen by speech therapy.  Continue dysphagia 1 diet. Seen by palliative care.  End-stage renal disease on peritoneal dialysis Nephrology is following.  Patient again had issues with peritoneal dialysis yesterday.  Activase dwell done overnight.  It looks like peritoneal dialysis to be started this morning.  If that does not work may need to change the peritoneal dialysis catheter.  We will defer all of this to nephrology.    Dehydration Secondary to dysphagia.  Was given IV fluids in the emergency department.  Improved.  History of stroke On aspirin and statin.  Hypothyroidism Continue levothyroxine.  History of depression and anxiety Resume home medications.  Functional quadriplegia Wife reported that patient has become bedbound and totally dependent at home.  Seen by palliative care.  Continued conversations regarding his care to be pursued in the outpatient setting.   Obesity  Estimated body mass index is 31.84 kg/m as calculated from the following:   Height as of this encounter: '5\' 8"'$  (1.727 m).   Weight as of this encounter: 95 kg.   DVT Prophylaxis: Subcutaneous heparin Code Status: DNR Family Communication: Discussed with patient.  Discussed with his wife yesterday.  Discussed also with his son. Disposition Plan: Having some issues with his peritoneal dialysis catheter.  May delay discharge.  Status is: Inpatient Remains inpatient appropriate because: Unable to dialyze secondary to malfunctioning peritoneal dialysis catheter.       Medications: Scheduled:  aspirin EC  81 mg Oral Daily   feeding  supplement (NEPRO CARB STEADY)  237 mL Oral BID BM   finasteride  5 mg Oral Daily   gentamicin cream  1 Application Topical Daily   heparin  5,000 Units Subcutaneous Q8H   levothyroxine  175 mcg Oral QAC breakfast   multivitamin  1 tablet Oral QHS   ondansetron  8 mg Oral TID AC   mouth rinse  15 mL Mouth Rinse 4 times per day   pantoprazole  40 mg Oral Daily   potassium chloride  10 mEq Oral Daily   sertraline  50 mg Oral Daily   simvastatin  40 mg Oral QPM   thiamine  100 mg Oral Daily   vortioxetine HBr  20 mg Oral Daily   Continuous:  dialysis solution 2.5% low-MG/low-CA     ION:GEXBMWUXL, ALPRAZolam, docusate sodium, fluticasone, ondansetron (ZOFRAN) IV, mouth rinse, polyethylene glycol  Antibiotics: Anti-infectives (From admission, onward)    None       Objective:  Vital Signs  Vitals:   01/04/22 0458 01/04/22 0742 01/04/22 2146 01/05/22 0456  BP: 107/73 121/71 112/70 114/70  Pulse: 71 66 68 67  Resp: '16 18 16 18  '$ Temp: 98 F (36.7 C) 97.7 F (36.5 C) 98.4 F (36.9 C) 98.6 F (37 C)  TempSrc: Oral Oral    SpO2: 98% 96% 95% 94%  Weight:      Height:        Intake/Output Summary (Last 24 hours) at 01/05/2022 0915 Last data filed at 01/05/2022 0600 Gross per 24 hour  Intake 180 ml  Output 0 ml  Net 180 ml    Filed Weights   01/01/22 1020 01/02/22 0030  Weight: 102 kg 95 kg    General appearance: Awake alert.  In no distress.  Mildly distracted Resp: Clear to auscultation bilaterally.  Normal effort Cardio: S1-S2 is normal regular.  No S3-S4.  No rubs murmurs or bruit GI: Abdomen is soft.  Nontender nondistended.  Bowel sounds are present normal.  No masses organomegaly.  Peritoneal dialysis catheter is noted Extremities: No edema.  Moving all of his extremities Neurologic:No focal neurological deficits.     Lab Results:  Data Reviewed: I have personally reviewed following labs and reports of the imaging studies  CBC: Recent Labs  Lab  01/01/22 1116 01/01/22 1911 01/03/22 1110  WBC 9.0 9.0 8.7  NEUTROABS 5.8  --   --   HGB 14.0 13.9 12.5*  HCT 40.4 40.4 35.6*  MCV 98.1 98.5 96.5  PLT 243 255 197     Basic Metabolic Panel: Recent Labs  Lab 01/01/22 1116 01/01/22 1911 01/02/22 0112 01/03/22 1110  NA 133*  --  135 136  K 3.5  --  4.0 3.6  CL 95*  --  98 94*  CO2 26  --  27 27  GLUCOSE 130*  --  124* 101*  BUN 36*  --  36* 41*  CREATININE 6.82* 7.10* 7.53* 7.65*  CALCIUM 9.6  --  9.7 9.6  MG  --   --  1.7  --   PHOS  --   --   --  5.3*     GFR: Estimated Creatinine Clearance: 9.2 mL/min (A) (by C-G formula based on SCr of 7.65 mg/dL (H)).  Liver Function Tests:  Recent Labs  Lab 01/01/22 1116 01/03/22 1110  AST 21  --   ALT 9  --   ALKPHOS 87  --   BILITOT 0.6  --   PROT 7.2  --   ALBUMIN 3.2* 2.8*      Radiology Studies: CT ABDOMEN PELVIS WO CONTRAST  Result Date: 01/03/2022 CLINICAL DATA:  Abdominal pain, intractable nausea and vomiting. EXAM: CT ABDOMEN AND PELVIS WITHOUT CONTRAST TECHNIQUE: Multidetector CT imaging of the abdomen and pelvis was performed following the standard protocol without IV contrast. RADIATION DOSE REDUCTION: This exam was performed according to the departmental dose-optimization program which includes automated exposure control, adjustment of the mA and/or kV according to patient size and/or use of iterative reconstruction technique. COMPARISON:  11/19/2021. FINDINGS: Lower chest: The heart is enlarged and there is no pericardial effusion. Mild atelectasis is present at the lung bases. Hepatobiliary: No focal liver abnormality is seen. No gallstones, gallbladder wall thickening, or biliary dilatation. Pancreas: Unremarkable. No pancreatic ductal dilatation or surrounding inflammatory changes. Spleen: Normal in size without focal abnormality. Adrenals/Urinary Tract: The adrenal glands are within normal limits. No renal calculus or hydronephrosis. The bladder is  unremarkable. Stomach/Bowel: There is a small hiatal hernia. The stomach is otherwise within normal limits. No free air or pneumatosis. Scattered diverticula are present along the colon without evidence of diverticulitis. Appendix appears normal. No evidence of bowel wall thickening, distention, or inflammatory changes. Vascular/Lymphatic: Aortic atherosclerosis. 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 No enlarged abdominal or pelvic lymph nodes. Reproductive: Prostate is unremarkable. Other: A peritoneal dialysis catheter is noted. A trace amount of free fluid is noted in the perihepatic space. A fat containing umbilical hernia is noted. Fat containing inguinal hernias are noted bilaterally. Musculoskeletal: Sternotomy wires are present over the midline. Degenerative changes are present in the thoracolumbar spine. No acute osseous abnormality. IMPRESSION: 1. No acute intra-abdominal process. 2. Small hiatal hernia. 3. Trace amount of perihepatic free fluid with peritoneal dialysis catheter in place. 4. Cardiomegaly with coronary artery calcifications. 5. Aortic atherosclerosis. Electronically Signed   By: Brett Fairy M.D.   On: 01/03/2022 20:28       LOS: 1 day   [facility] Pager on [email or web address]  01/05/2022, 9:15 AM

## 2022-01-05 NOTE — Discharge Summary (Signed)
[facility]  Physician Discharge Summary   Patient ID: [name] MRN: [id number] DOB/AGE: 1945/10/05 76 y.o.  Admit date: 01/01/2022 Discharge date: 01/05/2022    PCP: Celene Squibb, MD  DISCHARGE DIAGNOSES:  Principal Problem:   Intractable nausea and vomiting Active Problems:   Dysphagia   Hypertension   COPD (chronic obstructive pulmonary disease) (HCC)   CAD (coronary artery disease)   Essential (primary) hypertension   Hyperlipidemia, unspecified   Hypothyroidism, unspecified   S/P AVR (aortic valve replacement)   ESRD (end stage renal disease) on dialysis ([place])   History of CVA (cerebrovascular accident)   DNR (do not resuscitate)   Functional quadriplegia (HCC)   Malnutrition of moderate degree   ESRD (end stage renal disease) ([place])   RECOMMENDATIONS FOR OUTPATIENT FOLLOW UP: Patient to continue with the peritoneal dialysis at home Palliative care to follow patient at home   Home Health: Resume home health Equipment/Devices: None  CODE STATUS: DNR  DISCHARGE CONDITION: fair  Diet recommendation: Dysphagia 1 diet  INITIAL HISTORY: 76 year old gentleman who is DNR with cerebrovascular disease status post prior CVA, ESRD on nightly PD, chronic dysphagia on pure diet, moderate MS and moderate TR, status post AVR with bioprosthetic valve, paroxysmal atrial flutter, coronary artery disease, history of PE, chronic constipation, hypothyroidism was sent to the emergency department by his GI physician for failure to thrive.  Patient was just recently discharged from [facility] for treatment of peritonitis associated with his PD treatments.  He completed the antibiotic course and his peritoneal fluid has been clear according to wife.  He has had no fever and chills.  Unfortunately he has continued to decline with poor oral intake due to difficulty with swallowing which has been a chronic problem for him.  His wife has been trying to to arrange for outpatient  palliative services however has not been successful.  Patient has continued to decline and now basically has become totally bedbound requiring assistance with all of his ADLs.  He has become severely dehydrated because he has not been able to eat or drink well due to his dysphagia.  Wife is requesting inpatient palliative consultation for goals of care and treatment of his acute dehydration.   Consultants: Palliative care.  Nephrology   Procedures: Peritoneal dialysis   HOSPITAL COURSE:   Intractable nausea and vomiting This is apparently a longstanding issue.  Thought to be related to his oropharyngeal dysphagia and other factors.  Was noted to be dehydrated.  Was hospitalized and he was given IV fluids.   CT scan of the abdomen pelvis done on 7/8 does not show any acute findings.  The abnormal appearing bladder noted on CT renal stone study done in May not seen on this current CT.  This is reassuring. Symptoms appear to have improved.  He was started on around-the-clock ondansetron by palliative care.     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  Also has had a general decline in his health.  So appears to be multifactorial.   Seen by speech therapy.  Continue dysphagia 1 diet. Seen by palliative care.   End-stage renal disease on peritoneal dialysis Nephrology is following.  Patient again had issues with peritoneal dialysis yesterday.  Activase dwell done overnight.  Manual exchange was done which was successful.  Cleared by nephrology for discharge.  Dehydration Secondary to dysphagia.  Was given IV fluids in the emergency department.  Improved.  History of stroke On aspirin and statin.  Hypothyroidism Continue  levothyroxine.   History of depression and anxiety Resume home medications.   Functional quadriplegia Wife reported that patient has become bedbound and totally dependent at home.  Seen by palliative care.  Continued conversations regarding his  care to be pursued in the outpatient setting.   Obesity  Estimated body mass index is 31.84 kg/m as calculated from the following:   Height as of this encounter: '5\' 8"'$  (1.727 m).   Weight as of this encounter: 95 kg.   Stage II decubitus right buttock, present on admission Pressure Injury 12/14/21 Buttocks Right Stage 2 -  Partial thickness loss of dermis presenting as a shallow open injury with a red, pink wound bed without slough. (Active)  12/14/21 0937  Location: Buttocks  Location Orientation: Right  Staging: Stage 2 -  Partial thickness loss of dermis presenting as a shallow open injury with a red, pink wound bed without slough.  Wound Description (Comments):   Present on Admission: Yes   Moderate protein calorie malnutrition Nutrition Problem: Moderate Malnutrition Etiology: chronic illness (ESRD on PD, chronic dysphagia with chronic N/V)  Signs/Symptoms: mild fat depletion, mild muscle depletion, energy intake < or equal to 75% for > or equal to 1 month  Interventions: MVI, Magic cup, Refer to RD note for recommendations, Nepro shake  Patient is stable.  Okay for discharge home today.   PERTINENT LABS:  The results of significant diagnostics from this hospitalization (including imaging, microbiology, ancillary and laboratory) are listed below for reference.      Labs:   Basic Metabolic Panel: Recent Labs  Lab 01/01/22 1116 01/01/22 1911 01/02/22 0112 01/03/22 1110  NA 133*  --  135 136  K 3.5  --  4.0 3.6  CL 95*  --  98 94*  CO2 26  --  27 27  GLUCOSE 130*  --  124* 101*  BUN 36*  --  36* 41*  CREATININE 6.82* 7.10* 7.53* 7.65*  CALCIUM 9.6  --  9.7 9.6  MG  --   --  1.7  --   PHOS  --   --   --  5.3*   Liver Function Tests: Recent Labs  Lab 01/01/22 1116 01/03/22 1110  AST 21  --   ALT 9  --   ALKPHOS 87  --   BILITOT 0.6  --   PROT 7.2  --   ALBUMIN 3.2* 2.8*    CBC: Recent Labs  Lab 01/01/22 1116 01/01/22 1911 01/03/22 1110  WBC  9.0 9.0 8.7  NEUTROABS 5.8  --   --   HGB 14.0 13.9 12.5*  HCT 40.4 40.4 35.6*  MCV 98.1 98.5 96.5  PLT 243 255 197     IMAGING STUDIES CT ABDOMEN PELVIS WO CONTRAST  Result Date: 01/03/2022 CLINICAL DATA:  Abdominal pain, intractable nausea and vomiting. EXAM: CT ABDOMEN AND PELVIS WITHOUT CONTRAST TECHNIQUE: Multidetector CT imaging of the abdomen and pelvis was performed following the standard protocol without IV contrast. RADIATION DOSE REDUCTION: This exam was performed according to the departmental dose-optimization program which includes automated exposure control, adjustment of the mA and/or kV according to patient size and/or use of iterative reconstruction technique. COMPARISON:  11/19/2021. FINDINGS: Lower chest: The heart is enlarged and there is no pericardial effusion. Mild atelectasis is present at the lung bases. Hepatobiliary: No focal liver abnormality is seen. No gallstones, gallbladder wall thickening, or biliary dilatation. Pancreas: Unremarkable. No pancreatic ductal dilatation or surrounding inflammatory changes. Spleen: Normal in size without focal abnormality.  Adrenals/Urinary Tract: The adrenal glands are within normal limits. No renal calculus or hydronephrosis. The bladder is unremarkable. Stomach/Bowel: There is a small hiatal hernia. The stomach is otherwise within normal limits. No free air or pneumatosis. Scattered diverticula are present along the colon without evidence of diverticulitis. Appendix appears normal. No evidence of bowel wall thickening, distention, or inflammatory changes. Vascular/Lymphatic: Aortic atherosclerosis. 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 No enlarged abdominal or pelvic lymph nodes. Reproductive: Prostate is unremarkable. Other: A peritoneal dialysis catheter is noted. A trace amount of free fluid is noted in the perihepatic space. A fat containing umbilical hernia is noted. Fat containing inguinal hernias are noted  bilaterally. Musculoskeletal: Sternotomy wires are present over the midline. Degenerative changes are present in the thoracolumbar spine. No acute osseous abnormality. IMPRESSION: 1. No acute intra-abdominal process. 2. Small hiatal hernia. 3. Trace amount of perihepatic free fluid with peritoneal dialysis catheter in place. 4. Cardiomegaly with coronary artery calcifications. 5. Aortic atherosclerosis. Electronically Signed   By: Brett Fairy M.D.   On: 01/03/2022 20:28   DG Chest Portable 1 View  Result Date: 12/14/2021 CLINICAL DATA:  ams, abdominal pain EXAM: PORTABLE CHEST 1 VIEW COMPARISON:  Chest x-ray 11/19/2021 FINDINGS: Cardiomegaly. The heart and mediastinal contours are unchanged. Aortic calcification. Low lung volumes. Slightly improved aeration of the lower lungs with persistent retrocardiac and right middle lobe airspace opacity. No pulmonary edema. No pleural effusion. No pneumothorax. No acute osseous abnormality. IMPRESSION: Low lung volumes with slightly improved aeration of the lower lungs with persistent retrocardiac and right middle lobe airspace opacity. Followup PA and lateral chest X-ray is recommended in 3-4 weeks following therapy to ensure resolution and exclude underlying malignancy. Electronically Signed   By: Iven Finn M.D.   On: 12/14/2021 00:00    DISCHARGE EXAMINATION: Vitals:   01/04/22 0742 01/04/22 2146 01/05/22 0456 01/05/22 1054  BP: 121/71 112/70 114/70 118/75  Pulse: 66 68 67 75  Resp: '18 16 18 19  '$ Temp: 97.7 F (36.5 C) 98.4 F (36.9 C) 98.6 F (37 C) 98.4 F (36.9 C)  TempSrc: Oral   Oral  SpO2: 96% 95% 94% 98%  Weight:      Height:       Progress note from earlier today  DISPOSITION: Home  Discharge Instructions     Call MD for:  difficulty breathing, headache or visual disturbances   Complete by: As directed    Call MD for:  extreme fatigue   Complete by: As directed    Call MD for:  persistant dizziness or light-headedness    Complete by: As directed    Call MD for:  persistant nausea and vomiting   Complete by: As directed    Call MD for:  severe uncontrolled pain   Complete by: As directed    Call MD for:  temperature >100.4   Complete by: As directed    Discharge instructions   Complete by: As directed    Please take medication as prescribed.  Please continue with peritoneal dialysis as before  You were cared for by a hospitalist during your hospital stay. If you have any questions about your discharge medications or the care you received while you were in the hospital after you are discharged, you can call the unit and asked to speak with the hospitalist on call if the hospitalist that took care of you is not available. Once you are discharged, your primary care physician will handle any further medical issues. Please  note that NO REFILLS for any discharge medications will be authorized once you are discharged, as it is imperative that you return to your primary care physician (or establish a relationship with a primary care physician if you do not have one) for your aftercare needs so that they can reassess your need for medications and monitor your lab values. If you do not have a primary care physician, you can call [phone] for a physician referral.   Discharge wound care:   Complete by: As directed    Comments: Clean skin near exit site with chloraprep swab sticks.  Starting at catheter, use circular pattern around exit site, moving towards outer edges of area covered by dressing.  Apply gentamicin cream to site once daily.  Cover with dry dressing.   Increase activity slowly   Complete by: As directed           Allergies as of 01/05/2022   No Known Allergies      Medication List     STOP taking these medications    spironolactone 25 MG tablet Commonly known as: ALDACTONE   torsemide 100 MG tablet Commonly known as: DEMADEX       TAKE these medications    albuterol 108 (90 Base)  MCG/ACT inhaler Commonly known as: VENTOLIN HFA Inhale 2 puffs into the lungs every 6 (six) hours as needed for wheezing or shortness of breath.   ALPRAZolam 0.25 MG tablet Commonly known as: XANAX Take 0.25 mg by mouth at bedtime as needed for anxiety.   aspirin EC 81 MG tablet Take 81 mg by mouth daily. Swallow whole.   cyanocobalamin 1000 MCG/ML injection Commonly known as: (VITAMIN B-12) Inject 1,000 mcg into the muscle every 30 (thirty) days.   docusate sodium 100 MG capsule Commonly known as: COLACE Take 200 mg by mouth 2 (two) times daily as needed for mild constipation.   finasteride 5 MG tablet Commonly known as: PROSCAR Take 5 mg by mouth daily.   fluticasone 50 MCG/ACT nasal spray Commonly known as: FLONASE Place 1 spray into both nostrils daily as needed for allergies.   levothyroxine 175 MCG tablet Commonly known as: SYNTHROID Take 175 mcg by mouth daily before breakfast.   ondansetron 8 MG disintegrating tablet Commonly known as: ZOFRAN-ODT Take 1 tablet (8 mg total) by mouth every 8 (eight) hours as needed for nausea or vomiting.   pantoprazole 40 MG tablet Commonly known as: PROTONIX Take 40 mg by mouth daily.   polyethylene glycol 17 g packet Commonly known as: MIRALAX / GLYCOLAX Take 17 g by mouth daily. What changed:  when to take this reasons to take this   potassium chloride 10 MEQ tablet Commonly known as: KLOR-CON Take 10 mEq by mouth daily.   Santyl 250 UNIT/GM ointment Generic drug: collagenase Apply 1 application. topically daily as needed (sore).   sertraline 50 MG tablet Commonly known as: ZOLOFT Take 50 mg by mouth daily.   simvastatin 40 MG tablet Commonly known as: ZOCOR Take 1 tablet (40 mg total) by mouth every evening.   Trintellix 20 MG Tabs tablet Generic drug: vortioxetine HBr Take 20 mg by mouth daily.               Discharge Care Instructions  (From admission, onward)           Start     Ordered    01/05/22 0000  Discharge wound care:       Comments: Comments: Clean skin near exit site  with chloraprep swab sticks.  Starting at catheter, use circular pattern around exit site, moving towards outer edges of area covered by dressing.  Apply gentamicin cream to site once daily.  Cover with dry dressing.   01/05/22 1311              Follow-up Information     Celene Squibb, MD Follow up in 1 week(s).   Specialty: Internal Medicine Contact information: [place] [address] [phone]         [facility] Follow up.   Why: Someone will call you to schedule first home visit. Contact information: [place] [address] [facility] up.   Specialty: Hospice Why: Someone will call you to schedule first home visit. Contact information: [address]                TOTAL DISCHARGE TIME: 35 minutes  [facility] Pager on [email or web address]  01/06/2022, 12:30 PM

## 2022-01-05 NOTE — Progress Notes (Signed)
Activase dwell started at 1910 and will start CCPD after 2hrs of dwell as ordered.  2130 Attempted to aspirate gently, unable to pull fluid. Patient was repositioned to his right side per wife (wife states he turns to his right side when he does PD at home and they were able to dialyze in this position). But still unable to pull fluid.  2150 Notified Dr. Joelyn Oms about the Activase dwell and PD catheter. Ordered to do dwell overnight and to start PD tomorrow morning. If it still doesn't work, he will be scheduled for PD catheter change. Report given to bedside RN

## 2022-01-05 NOTE — Progress Notes (Signed)
Dr Jonnie Finner ordered ok to flush PD catheter with TPA instilled and preform manual exchange

## 2022-01-05 NOTE — Progress Notes (Signed)
Manual exchange successful pt has no c/os of discomfort +400 uf wife states normal due to pt < PO intake, MD notified RN notified

## 2022-01-05 NOTE — Progress Notes (Signed)
Attempted aspirate TPA from PD cath unable to aspirate pt informed notified MD and PA...awaiting further instructions

## 2022-01-06 LAB — VITAMIN B6

## 2022-01-06 LAB — COPPER, SERUM: Copper: 124 ug/dL (ref 69–132)

## 2022-01-06 NOTE — Progress Notes (Signed)
Late Note Entry:  D/C summary and last renal note faxed to [facility] attn: Mary for continuation of care.   Melven Sartorius Renal Navigator [phone]

## 2022-01-07 LAB — VITAMIN C: Vitamin C: 0.3 mg/dL — ABNORMAL LOW (ref 0.4–2.0)

## 2022-01-08 LAB — VITAMIN B1

## 2022-01-27 ENCOUNTER — Other Ambulatory Visit: Payer: Self-pay

## 2022-01-27 ENCOUNTER — Emergency Department (HOSPITAL_COMMUNITY): Payer: Managed Care, Other (non HMO)

## 2022-01-27 ENCOUNTER — Inpatient Hospital Stay (HOSPITAL_COMMUNITY)
Admission: EM | Admit: 2022-01-27 | Discharge: 2022-02-02 | DRG: 867 | Disposition: A | Discharge status: Home Health | Payer: Managed Care, Other (non HMO) | Attending: Family Medicine | Admitting: Family Medicine

## 2022-01-27 ENCOUNTER — Encounter (HOSPITAL_COMMUNITY): Payer: Self-pay | Admitting: Emergency Medicine

## 2022-01-27 DIAGNOSIS — I493 Ventricular premature depolarization: Secondary | ICD-10-CM | POA: Diagnosis present

## 2022-01-27 DIAGNOSIS — Z7982 Long term (current) use of aspirin: Secondary | ICD-10-CM

## 2022-01-27 DIAGNOSIS — I080 Rheumatic disorders of both mitral and aortic valves: Secondary | ICD-10-CM | POA: Diagnosis present

## 2022-01-27 DIAGNOSIS — L8990 Pressure ulcer of unspecified site, unspecified stage: Secondary | ICD-10-CM | POA: Diagnosis present

## 2022-01-27 DIAGNOSIS — Z66 Do not resuscitate: Secondary | ICD-10-CM | POA: Diagnosis present

## 2022-01-27 DIAGNOSIS — N186 End stage renal disease: Secondary | ICD-10-CM | POA: Diagnosis not present

## 2022-01-27 DIAGNOSIS — Z515 Encounter for palliative care: Secondary | ICD-10-CM

## 2022-01-27 DIAGNOSIS — R532 Functional quadriplegia: Secondary | ICD-10-CM | POA: Diagnosis present

## 2022-01-27 DIAGNOSIS — Z8673 Personal history of transient ischemic attack (TIA), and cerebral infarction without residual deficits: Secondary | ICD-10-CM

## 2022-01-27 DIAGNOSIS — R627 Adult failure to thrive: Secondary | ICD-10-CM | POA: Diagnosis not present

## 2022-01-27 DIAGNOSIS — R000 Tachycardia, unspecified: Secondary | ICD-10-CM | POA: Diagnosis present

## 2022-01-27 DIAGNOSIS — R112 Nausea with vomiting, unspecified: Secondary | ICD-10-CM | POA: Diagnosis present

## 2022-01-27 DIAGNOSIS — F32A Depression, unspecified: Secondary | ICD-10-CM | POA: Diagnosis present

## 2022-01-27 DIAGNOSIS — Z7401 Bed confinement status: Secondary | ICD-10-CM

## 2022-01-27 DIAGNOSIS — L89312 Pressure ulcer of right buttock, stage 2: Secondary | ICD-10-CM | POA: Diagnosis present

## 2022-01-27 DIAGNOSIS — Z79899 Other long term (current) drug therapy: Secondary | ICD-10-CM

## 2022-01-27 DIAGNOSIS — Z953 Presence of xenogenic heart valve: Secondary | ICD-10-CM

## 2022-01-27 DIAGNOSIS — I120 Hypertensive chronic kidney disease with stage 5 chronic kidney disease or end stage renal disease: Secondary | ICD-10-CM | POA: Diagnosis present

## 2022-01-27 DIAGNOSIS — B9561 Methicillin susceptible Staphylococcus aureus infection as the cause of diseases classified elsewhere: Secondary | ICD-10-CM | POA: Diagnosis present

## 2022-01-27 DIAGNOSIS — K922 Gastrointestinal hemorrhage, unspecified: Secondary | ICD-10-CM | POA: Diagnosis not present

## 2022-01-27 DIAGNOSIS — E871 Hypo-osmolality and hyponatremia: Secondary | ICD-10-CM | POA: Diagnosis present

## 2022-01-27 DIAGNOSIS — R001 Bradycardia, unspecified: Secondary | ICD-10-CM | POA: Diagnosis present

## 2022-01-27 DIAGNOSIS — E860 Dehydration: Secondary | ICD-10-CM | POA: Diagnosis present

## 2022-01-27 DIAGNOSIS — T8029XA Infection following other infusion, transfusion and therapeutic injection, initial encounter: Principal | ICD-10-CM | POA: Diagnosis present

## 2022-01-27 DIAGNOSIS — Y841 Kidney dialysis as the cause of abnormal reaction of the patient, or of later complication, without mention of misadventure at the time of the procedure: Secondary | ICD-10-CM | POA: Diagnosis present

## 2022-01-27 DIAGNOSIS — I4892 Unspecified atrial flutter: Secondary | ICD-10-CM | POA: Diagnosis present

## 2022-01-27 DIAGNOSIS — Z87442 Personal history of urinary calculi: Secondary | ICD-10-CM

## 2022-01-27 DIAGNOSIS — I2510 Atherosclerotic heart disease of native coronary artery without angina pectoris: Secondary | ICD-10-CM | POA: Diagnosis present

## 2022-01-27 DIAGNOSIS — D62 Acute posthemorrhagic anemia: Secondary | ICD-10-CM | POA: Diagnosis present

## 2022-01-27 DIAGNOSIS — Z79890 Hormone replacement therapy (postmenopausal): Secondary | ICD-10-CM

## 2022-01-27 DIAGNOSIS — J449 Chronic obstructive pulmonary disease, unspecified: Secondary | ICD-10-CM | POA: Diagnosis present

## 2022-01-27 DIAGNOSIS — Z87891 Personal history of nicotine dependence: Secondary | ICD-10-CM

## 2022-01-27 DIAGNOSIS — E782 Mixed hyperlipidemia: Secondary | ICD-10-CM | POA: Diagnosis present

## 2022-01-27 DIAGNOSIS — K920 Hematemesis: Secondary | ICD-10-CM | POA: Diagnosis present

## 2022-01-27 DIAGNOSIS — I272 Pulmonary hypertension, unspecified: Secondary | ICD-10-CM | POA: Diagnosis present

## 2022-01-27 DIAGNOSIS — I69322 Dysarthria following cerebral infarction: Secondary | ICD-10-CM

## 2022-01-27 DIAGNOSIS — E440 Moderate protein-calorie malnutrition: Secondary | ICD-10-CM | POA: Diagnosis present

## 2022-01-27 DIAGNOSIS — I69391 Dysphagia following cerebral infarction: Secondary | ICD-10-CM

## 2022-01-27 DIAGNOSIS — R1312 Dysphagia, oropharyngeal phase: Secondary | ICD-10-CM | POA: Diagnosis present

## 2022-01-27 DIAGNOSIS — I248 Other forms of acute ischemic heart disease: Secondary | ICD-10-CM | POA: Diagnosis present

## 2022-01-27 DIAGNOSIS — E876 Hypokalemia: Secondary | ICD-10-CM | POA: Diagnosis present

## 2022-01-27 DIAGNOSIS — R296 Repeated falls: Secondary | ICD-10-CM | POA: Diagnosis present

## 2022-01-27 DIAGNOSIS — E039 Hypothyroidism, unspecified: Secondary | ICD-10-CM | POA: Diagnosis present

## 2022-01-27 DIAGNOSIS — Z86711 Personal history of pulmonary embolism: Secondary | ICD-10-CM

## 2022-01-27 DIAGNOSIS — Z8249 Family history of ischemic heart disease and other diseases of the circulatory system: Secondary | ICD-10-CM

## 2022-01-27 DIAGNOSIS — Z992 Dependence on renal dialysis: Secondary | ICD-10-CM

## 2022-01-27 DIAGNOSIS — R1310 Dysphagia, unspecified: Secondary | ICD-10-CM

## 2022-01-27 DIAGNOSIS — K650 Generalized (acute) peritonitis: Secondary | ICD-10-CM

## 2022-01-27 DIAGNOSIS — Z951 Presence of aortocoronary bypass graft: Secondary | ICD-10-CM

## 2022-01-27 LAB — CBC WITH DIFFERENTIAL/PLATELET
Abs Immature Granulocytes: 0.43 10*3/uL — ABNORMAL HIGH (ref 0.00–0.07)
Basophils Absolute: 0.1 10*3/uL (ref 0.0–0.1)
Basophils Relative: 1 %
Eosinophils Absolute: 0 10*3/uL (ref 0.0–0.5)
Eosinophils Relative: 0 %
HCT: 39.8 % (ref 39.0–52.0)
Hemoglobin: 14.1 g/dL (ref 13.0–17.0)
Immature Granulocytes: 4 %
Lymphocytes Relative: 10 %
Lymphs Abs: 1.1 10*3/uL (ref 0.7–4.0)
MCH: 33.6 pg (ref 26.0–34.0)
MCHC: 35.4 g/dL (ref 30.0–36.0)
MCV: 94.8 fL (ref 80.0–100.0)
Monocytes Absolute: 0.3 10*3/uL (ref 0.1–1.0)
Monocytes Relative: 3 %
Neutro Abs: 9.5 10*3/uL — ABNORMAL HIGH (ref 1.7–7.7)
Neutrophils Relative %: 82 %
Platelets: 292 10*3/uL (ref 150–400)
RBC: 4.2 MIL/uL — ABNORMAL LOW (ref 4.22–5.81)
RDW: 13 % (ref 11.5–15.5)
WBC: 11.4 10*3/uL — ABNORMAL HIGH (ref 4.0–10.5)
nRBC: 0 % (ref 0.0–0.2)

## 2022-01-27 LAB — TYPE AND SCREEN
ABO/RH(D): A NEG
Antibody Screen: NEGATIVE

## 2022-01-27 LAB — COMPREHENSIVE METABOLIC PANEL
ALT: 15 U/L (ref 0–44)
AST: 22 U/L (ref 15–41)
Albumin: 2.5 g/dL — ABNORMAL LOW (ref 3.5–5.0)
Alkaline Phosphatase: 92 U/L (ref 38–126)
Anion gap: 13 (ref 5–15)
BUN: 33 mg/dL — ABNORMAL HIGH (ref 8–23)
CO2: 25 mmol/L (ref 22–32)
Calcium: 9.3 mg/dL (ref 8.9–10.3)
Chloride: 94 mmol/L — ABNORMAL LOW (ref 98–111)
Creatinine, Ser: 5.4 mg/dL — ABNORMAL HIGH (ref 0.61–1.24)
GFR, Estimated: 10 mL/min — ABNORMAL LOW (ref >60)
Glucose, Bld: 161 mg/dL — ABNORMAL HIGH (ref 70–99)
Potassium: 3.1 mmol/L — ABNORMAL LOW (ref 3.5–5.1)
Sodium: 132 mmol/L — ABNORMAL LOW (ref 135–145)
Total Bilirubin: 0.5 mg/dL (ref 0.3–1.2)
Total Protein: 6.9 g/dL (ref 6.5–8.1)

## 2022-01-27 MED ORDER — SODIUM CHLORIDE 0.9 % IV BOLUS
250.0000 mL | Freq: Once | INTRAVENOUS | Status: AC
  Administered 2022-01-27: 250 mL via INTRAVENOUS

## 2022-01-27 MED ORDER — PANTOPRAZOLE SODIUM 40 MG IV SOLR
40.0000 mg | Freq: Once | INTRAVENOUS | Status: AC
  Administered 2022-01-27: 40 mg via INTRAVENOUS
  Filled 2022-01-27: qty 10

## 2022-01-27 MED ORDER — ONDANSETRON HCL 4 MG/2ML IJ SOLN
4.0000 mg | Freq: Four times a day (QID) | INTRAMUSCULAR | Status: DC
  Administered 2022-01-28 – 2022-02-02 (×18): 4 mg via INTRAVENOUS
  Filled 2022-01-27 (×19): qty 2

## 2022-01-27 MED ORDER — SODIUM CHLORIDE 0.9 % IV SOLN: 250.0000 mL | INTRAVENOUS | Status: DC | PRN

## 2022-01-27 MED ORDER — POTASSIUM CHLORIDE 10 MEQ/100ML IV SOLN
10.0000 meq | INTRAVENOUS | Status: AC
  Administered 2022-01-27 – 2022-01-28 (×2): 10 meq via INTRAVENOUS
  Filled 2022-01-27 (×2): qty 100

## 2022-01-27 MED ORDER — ONDANSETRON HCL 4 MG PO TABS: 4.0000 mg | ORAL_TABLET | Freq: Four times a day (QID) | ORAL | Status: DC | PRN

## 2022-01-27 MED ORDER — SODIUM CHLORIDE 0.9% FLUSH: 3.0000 mL | INTRAVENOUS | Status: DC | PRN

## 2022-01-27 MED ORDER — METOCLOPRAMIDE HCL 5 MG/ML IJ SOLN
10.0000 mg | Freq: Once | INTRAMUSCULAR | Status: AC
  Administered 2022-01-27: 10 mg via INTRAVENOUS
  Filled 2022-01-27: qty 2

## 2022-01-27 MED ORDER — SODIUM CHLORIDE 0.9% FLUSH
3.0000 mL | Freq: Two times a day (BID) | INTRAVENOUS | Status: DC
  Administered 2022-01-27 – 2022-02-01 (×10): 3 mL via INTRAVENOUS

## 2022-01-27 MED ORDER — ONDANSETRON HCL 4 MG/2ML IJ SOLN: 4.0000 mg | Freq: Four times a day (QID) | INTRAMUSCULAR | Status: DC | PRN

## 2022-01-27 MED ORDER — PANTOPRAZOLE SODIUM 40 MG IV SOLR
40.0000 mg | INTRAVENOUS | Status: DC
  Administered 2022-01-28 – 2022-02-02 (×6): 40 mg via INTRAVENOUS
  Filled 2022-01-27 (×6): qty 10

## 2022-01-27 NOTE — ED Triage Notes (Signed)
Pt started vomiting blood today and does peritoneal dialysis at home. Pt c/o abd pain.

## 2022-01-27 NOTE — Assessment & Plan Note (Signed)
Patient has a history of end-stage renal disease and is on peritoneal dialysis We will request nephrology consult

## 2022-01-27 NOTE — ED Provider Notes (Signed)
[facility] EMERGENCY DEPARTMENT Provider Note   CSN: [id number] Arrival date & time: 01/27/22  1858     History {Add pertinent medical, surgical, social history, OB history to HPI:1} Chief Complaint  Patient presents with   Hematemesis    [name] is a 76 y.o. male.  Patient has been vomiting up blood.  He has a history of peritoneal dialysis   Emesis      Home Medications Prior to Admission medications   Medication Sig Start Date End Date Taking? Authorizing Provider  aspirin EC 81 MG tablet Take 81 mg by mouth daily. Swallow whole.   Yes [provider]  B Complex-C-Folic Acid (RENO CAPS) 1 MG CAPS Take by mouth.   Yes [provider]  collagenase (SANTYL) 250 UNIT/GM ointment Apply 1 application. topically daily as needed (sore).   Yes [provider]  cyanocobalamin (,VITAMIN B-12,) 1000 MCG/ML injection Inject 1,000 mcg into the muscle every 30 (thirty) days.   Yes [provider]  docusate sodium (COLACE) 100 MG capsule Take 200 mg by mouth 2 (two) times daily as needed for mild constipation.   Yes [provider]  finasteride (PROSCAR) 5 MG tablet Take 5 mg by mouth daily.   Yes [provider]  levothyroxine (SYNTHROID) 175 MCG tablet Take 175 mcg by mouth daily before breakfast.  05/12/12  Yes [provider]  ondansetron (ZOFRAN-ODT) 4 MG disintegrating tablet Take 4 mg by mouth every 8 (eight) hours as needed. 01/26/22  Yes [provider]  ondansetron (ZOFRAN-ODT) 8 MG disintegrating tablet Take 1 tablet (8 mg total) by mouth every 8 (eight) hours as needed for nausea or vomiting. 01/04/22  Yes Bonnielee Haff, MD  pantoprazole (PROTONIX) 40 MG tablet Take 40 mg by mouth daily.   Yes [provider]  polyethylene glycol (MIRALAX / GLYCOLAX) 17 g packet Take 17 g by mouth daily. Patient taking differently: Take 17 g by mouth daily as needed for mild constipation or moderate  constipation. 06/14/20  Yes Regalado, Belkys A, MD  potassium chloride (KLOR-CON) 10 MEQ tablet Take 10 mEq by mouth 2 (two) times daily. 12/08/21  Yes [provider]  sertraline (ZOLOFT) 50 MG tablet Take 50 mg by mouth daily.   Yes [provider]  simvastatin (ZOCOR) 40 MG tablet Take 1 tablet (40 mg total) by mouth every evening. 11/07/21  Yes Strader, [place] M, PA-C  TRINTELLIX 20 MG TABS tablet Take 20 mg by mouth daily. 03/19/21  Yes [provider]      Allergies    Patient has no known allergies.    Review of Systems   Review of Systems  Gastrointestinal:  Positive for vomiting.    Physical Exam Updated Vital Signs BP 111/62   Pulse (!) 110   Temp 98.2 F (36.8 C) (Oral)   Resp 20   Ht '5\' 8"'$  (1.727 m)   Wt 95 kg   SpO2 99%   BMI 31.84 kg/m  Physical Exam  ED Results / Procedures / Treatments   Labs (all labs ordered are listed, but only abnormal results are displayed) Labs Reviewed  CBC WITH DIFFERENTIAL/PLATELET - Abnormal; Notable for the following components:      Result Value   WBC 11.4 (*)    RBC 4.20 (*)    Neutro Abs 9.5 (*)    Abs Immature Granulocytes 0.43 (*)    All other components within normal limits  COMPREHENSIVE METABOLIC PANEL - Abnormal; Notable for the  following components:   Sodium 132 (*)    Potassium 3.1 (*)    Chloride 94 (*)    Glucose, Bld 161 (*)    BUN 33 (*)    Creatinine, Ser 5.40 (*)    Albumin 2.5 (*)    GFR, Estimated 10 (*)    All other components within normal limits  TYPE AND SCREEN    EKG EKG Interpretation  Date/Time:  Tuesday January 27 2022 19:13:57 EDT Ventricular Rate:  113 PR Interval:    QRS Duration: 125 QT Interval:  334 QTC Calculation: 458 R Axis:   57 Text Interpretation: Atrial flutter Ventricular premature complex LVH with IVCD and secondary repol abnrm Inferior infarct, age indeterminate Anterior ST elevation, probably due to LVH Confirmed by Milton Ferguson [phone]) on  01/27/2022 7:52:32 PM  Radiology CT ABDOMEN PELVIS WO CONTRAST  Result Date: 01/27/2022 CLINICAL DATA:  Abdominal pain and hematemesis, initial encounter EXAM: CT ABDOMEN AND PELVIS WITHOUT CONTRAST TECHNIQUE: Multidetector CT imaging of the abdomen and pelvis was performed following the standard protocol without IV contrast. RADIATION DOSE REDUCTION: This exam was performed according to the departmental dose-optimization program which includes automated exposure control, adjustment of the mA and/or kV according to patient size and/or use of iterative reconstruction technique. COMPARISON:  01/03/2022 FINDINGS: Lower chest: Emphysematous changes are noted. Chronic scarring is noted in the bases particularly on the left stable from the prior exam. Hepatobiliary: Liver demonstrates minimal nodularity. No focal mass is seen. The gallbladder is within normal limits. Pancreas: Unremarkable. No pancreatic ductal dilatation or surrounding inflammatory changes. Spleen: Normal in size without focal abnormality. Adrenals/Urinary Tract: Adrenal glands are within normal limits. The kidneys are well visualize without renal calculi or urinary tract obstructive changes. Vascular calcifications are seen. The bladder is partially distended. Stomach/Bowel: No obstructive or inflammatory changes of the colon are seen. The appendix is well visualized and within normal limits. No inflammatory changes to suggest appendicitis are noted. Small bowel and stomach are unremarkable. Vascular/Lymphatic: Aortic atherosclerosis. No enlarged abdominal or pelvic lymph nodes. Reproductive: Prostate is unremarkable. Other: Considerable free fluid is noted throughout the abdomen consistent with the known history of peritoneal dialysis. The peritoneal dialysis catheter is noted deep in the pelvis. No focal herniation is noted. Musculoskeletal: Degenerative changes of lumbar spine are noted. IMPRESSION: Peritoneal dialysis catheter with evidence of  dialysate throughout the abdomen. No acute abnormality is identified correspond with the given clinical history. Electronically Signed   By: Inez Catalina M.D.   On: 01/27/2022 20:35    Procedures Procedures  {Document cardiac monitor, telemetry assessment procedure when appropriate:1}  Medications Ordered in ED Medications  sodium chloride 0.9 % bolus 250 mL (250 mLs Intravenous New Bag/Given 01/27/22 2029)  pantoprazole (PROTONIX) injection 40 mg (40 mg Intravenous Given 01/27/22 2029)    ED Course/ Medical Decision Making/ A&P I spoke with Dr. Abbey Chatters gastroenterologist.  And he suggested admitting the patient to medicine and making him n.p.o. after midnight giving him some Protonix IV and they will see him tomorrow and possibly scope                         Medical Decision Making Amount and/or Complexity of Data Reviewed Labs: ordered. Radiology: ordered.  Risk Prescription drug management. Decision regarding hospitalization.   Upper GI bleed.  Patient will be admitted to medicine with GI consult  {Document critical care time when appropriate:1} {Document review of labs and clinical decision tools ie heart  score, Chads2Vasc2 etc:1}  {Document your independent review of radiology images, and any outside records:1} {Document your discussion with family members, caretakers, and with consultants:1} {Document social determinants of health affecting pt's care:1} {Document your decision making why or why not admission, treatments were needed:1} Final Clinical Impression(s) / ED Diagnoses Final diagnoses:  Upper GI bleed    Rx / DC Orders ED Discharge Orders     None

## 2022-01-27 NOTE — H&P (Addendum)
History and Physical    Patient: [name] GYF:[id number] DOB: 01/20/1946 DOA: 01/27/2022 DOS: the patient was seen and examined on 01/27/2022 PCP: Celene Squibb, MD  Patient coming from: Home  Chief Complaint:  Chief Complaint  Patient presents with   Hematemesis   Most of the history was obtained from patient's wife at the bedside HPI: [name] is a 76 y.o. 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 Patient's wife who provides most of the history states that he has had poor oral intake and has had nausea and vomiting for several days associated with periumbilical abdominal pain.  On the day of admission he had 2 episodes of emesis containing bright red blood which was concerning to the wife and so he was brought into the ER for evaluation. Chart review shows that he was recently admitted at [facility] for intractable nausea and vomiting which appears to be a longstanding issue thought to be related to his oropharyngeal dysphagia.  He was seen by palliative care during that hospitalization and started on scheduled Zofran with some improvement in his symptoms. Patient was seen and examined in the ER and is unable to provide any history. I am unable to do review of systems on this patient due to his current condition. Noted to have a hemoglobin of 14. He will be referred to observation status for further evaluation   Review of Systems: unable to review all systems due to the inability of the patient to answer questions. Past Medical History:  Diagnosis Date   Aortic stenosis    Status post AVR, 25 mm Edwards pericardial Magna-Ease valve  2014   Arthritis    CAD (coronary artery disease)    Status post SVG to OM 2014   COPD (chronic obstructive pulmonary disease) ([place])    Depression    Dialysis patient ([place])    on PD 7 night weekly at home   ESRD on peritoneal dialysis [facility])    Essential hypertension    Gout    Hemorrhoid    Hypertension    Hypothyroidism    Kidney stones    Lumbar spondylolysis    Mixed hyperlipidemia    Orthostatic hypotension    Osteoarthritis    Pulmonary emboli ([place]) 08/21/2020   Syncope    Vitamin D deficiency    Past Surgical History:  Procedure Laterality Date   ANGIOPASTY     AORTIC VALVE REPLACEMENT  07/11/2012   Procedure: AORTIC VALVE REPLACEMENT (AVR);  Surgeon: Gaye Pollack, MD;  Location: [place];  Service: Open Heart Surgery;  Laterality: N/A;   AV FISTULA PLACEMENT Left 05/30/2020   Procedure: ARTERIOVENOUS (AV) FISTULA CREATION LEFT;  Surgeon: [name], MD;  Location: [facility];  Service: Vascular;  Laterality: Left;   CARDIAC CATHETERIZATION     CAROTID ENDARTERECTOMY Left    CATARACT EXTRACTION W/PHACO Right 06/09/2021   Procedure: CATARACT EXTRACTION RIGHT EYE W/ PHACO AND INTRAOCULAR LENS PLACEMENT ([place]);  Surgeon: Baruch Goldmann, MD;  Location: [facility];  Service: Ophthalmology;  Laterality: Right;  CDE: 12.97   CATARACT EXTRACTION W/PHACO Left 06/27/2021   Procedure: CATARACT EXTRACTION PHACO AND INTRAOCULAR LENS PLACEMENT ([place]) WITH IACCESS GONIOTOMY;  Surgeon:  Baruch Goldmann, MD;  Location: [facility];  Service: Ophthalmology;  Laterality: Left;  CDE: 10.08   CIRCUMCISION     COLONOSCOPY WITH PROPOFOL N/A 07/16/2020   Procedure: COLONOSCOPY WITH PROPOFOL;  Surgeon: Doran Stabler, MD;  Location: [place];  Service: Gastroenterology;  Laterality: N/A;   CORONARY ARTERY BYPASS GRAFT  07/11/2012   Procedure: CORONARY ARTERY BYPASS GRAFTING (CABG);  Surgeon: Gaye Pollack, MD;  Location: [place];  Service: Open Heart Surgery;  Laterality: N/A;  CABG x one,  using right  leg greater saphenous vein harvested endoscopically   ESOPHAGOGASTRODUODENOSCOPY (EGD) WITH PROPOFOL N/A 07/15/2020   Procedure: ESOPHAGOGASTRODUODENOSCOPY (EGD) WITH PROPOFOL;  Surgeon: Doran Stabler, MD;  Location: [place];  Service: Gastroenterology;  Laterality: N/A;   HOT HEMOSTASIS N/A 07/16/2020   Procedure: HOT HEMOSTASIS (ARGON PLASMA COAGULATION/BICAP);  Surgeon: Doran Stabler, MD;  Location: [place];  Service: Gastroenterology;  Laterality: N/A;   INSERTION OF ANTERIOR SEGMENT AQUEOUS DRAINAGE DEVICE (IST[place]) Right 06/09/2021   Procedure: INSERTION OF RIGHT EYE ANTERIOR SEGMENT AQUEOUS DRAINAGE DEVICE (ISTENT);  Surgeon: Baruch Goldmann, MD;  Location: [facility];  Service: Ophthalmology;  Laterality: Right;  CDE: 12.97   INTRAOPERATIVE TRANSESOPHAGEAL ECHOCARDIOGRAM  07/11/2012   Procedure: INTRAOPERATIVE TRANSESOPHAGEAL ECHOCARDIOGRAM;  Surgeon: Gaye Pollack, MD;  Location: [place] [facility];  Service: Open Heart Surgery;  Laterality: N/A;   IR PERC TUN PERIT CATH WO PORT S&I /IMAG  06/13/2020   IR REMOVAL TUN CV CATH W/O FL  07/26/2020   IR US GUIDE VASC ACCESS RIGHT  06/13/2020   JOINT REPLACEMENT Bilateral    knees   TEE WITHOUT CARDIOVERSION N/A 06/12/2020   Procedure: TRANSESOPHAGEAL ECHOCARDIOGRAM (TEE);  Surgeon: Skeet Latch, MD;  Location: [place];  Service: Cardiovascular;  Laterality: N/A;   Social History:  reports that he quit smoking about 11 years ago. His smoking use included cigarettes. He has a 150.00 pack-year smoking history. He has quit using smokeless tobacco. He reports that he does not currently use alcohol. He reports that he does not use drugs.  No Known Allergies  Family History  Problem Relation Age of Onset   Heart disease Mother    Heart disease Father    Hypertension Father    Colon cancer Neg Hx    Esophageal cancer Neg Hx    Stomach cancer Neg Hx     Prior to Admission medications   Medication Sig Start Date End Date Taking?  Authorizing Provider  aspirin EC 81 MG tablet Take 81 mg by mouth daily. Swallow whole.   Yes [provider]  B Complex-C-Folic Acid (RENO CAPS) 1 MG CAPS Take by mouth.   Yes [provider]  collagenase (SANTYL) 250 UNIT/GM ointment Apply 1 application. topically daily as needed (sore).   Yes [provider]  cyanocobalamin (,VITAMIN B-12,) 1000 MCG/ML injection Inject 1,000 mcg into the muscle every 30 (thirty) days.   Yes [provider]  docusate sodium (COLACE) 100 MG capsule Take 200 mg by mouth 2 (two) times daily as needed for mild constipation.   Yes [provider]  finasteride (PROSCAR) 5 MG tablet Take 5 mg by mouth daily.   Yes [provider]  levothyroxine (SYNTHROID) 175 MCG tablet Take 175 mcg by mouth daily before breakfast.  05/12/12  Yes [provider]  ondansetron (ZOFRAN-ODT) 4 MG disintegrating tablet Take 4 mg by mouth every 8 (eight) hours as needed. 01/26/22  Yes [provider]  ondansetron (ZOFRAN-ODT)  8 MG disintegrating tablet Take 1 tablet (8 mg total) by mouth every 8 (eight) hours as needed for nausea or vomiting. 01/04/22  Yes Bonnielee Haff, MD  pantoprazole (PROTONIX) 40 MG tablet Take 40 mg by mouth daily.   Yes [provider]  polyethylene glycol (MIRALAX / GLYCOLAX) 17 g packet Take 17 g by mouth daily. Patient taking differently: Take 17 g by mouth daily as needed for mild constipation or moderate constipation. 06/14/20  Yes Regalado, Belkys A, MD  potassium chloride (KLOR-CON) 10 MEQ tablet Take 10 mEq by mouth 2 (two) times daily. 12/08/21  Yes [provider]  sertraline (ZOLOFT) 50 MG tablet Take 50 mg by mouth daily.   Yes [provider]  simvastatin (ZOCOR) 40 MG tablet Take 1 tablet (40 mg total) by mouth every evening. 11/07/21  Yes Strader, [place] M, PA-C  TRINTELLIX 20 MG TABS tablet Take 20 mg by mouth daily. 03/19/21  Yes [provider]     Physical Exam: Vitals:   01/27/22 1910 01/27/22 1912 01/27/22 2030 01/27/22 2140  BP:  (!) 139/113 111/62   Pulse:  (!) 110    Resp:  (!) 22 20   Temp:  98.2 F (36.8 C)    TempSrc:  Oral    SpO2:  95% 99% 99%  Weight: 95 kg     Height: '5\' 8"'  (1.727 m)      Physical Exam Vitals and nursing note reviewed.  Constitutional:      Comments: Chronically ill-appearing  HENT:     Head: Normocephalic and atraumatic.     Nose: Nose normal.     Mouth/Throat:     Mouth: Mucous membranes are dry.  Eyes:     Conjunctiva/sclera: Conjunctivae normal.  Cardiovascular:     Rate and Rhythm: Tachycardia present.  Pulmonary:     Effort: Pulmonary effort is normal.     Breath sounds: Normal breath sounds.  Abdominal:     General: Bowel sounds are normal.     Palpations: Abdomen is soft.     Tenderness: There is abdominal tenderness.     Comments: Central adiposity, periumbilical tenderness  Musculoskeletal:        General: Normal range of motion.     Cervical back: Normal range of motion and neck supple.  Neurological:     Mental Status: He is alert.     Motor: Weakness present.  Psychiatric:     Comments: Unable to assess     Data Reviewed: Relevant notes from primary care and specialist visits, past discharge summaries as available in EHR, including Care Everywhere. Prior diagnostic testing as pertinent to current admission diagnoses Updated medications and problem lists for reconciliation ED course, including vitals, labs, imaging, treatment and response to treatment Triage notes, nursing and pharmacy notes and ED provider's notes Notable results as noted in HPI Labs reviewed.  Sodium 132, potassium 3.1, chloride 94, bicarb 25, glucose 161, BUN 33, creatinine 5.40, calcium 9.3, total protein 6.9, albumin 2.5, AST 22, ALT 15, alk phos 92, total bilirubin 0.5, white count 11.4, hemoglobin 14.1, hematocrit 39.8, platelet count 292 CT scan of abdomen and pelvis without contrast  shows Peritoneal dialysis catheter with evidence of dialysate throughout the abdomen.No acute abnormality is identified correspond with the given clinical history. Twelve-lead EKG reviewed by me shows atrial flutter with PVCs There are no new results to review at this time.  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 Patient was recently hospitalized at [facility] for same We will give a dose of IV Reglan Place patient on scheduled Zofran  ESRD (end stage renal disease) on dialysis [facility]) Patient has a history of end-stage renal disease and is on peritoneal dialysis We will request nephrology consult  Failure to thrive in adult Patient with a history of adult failure to thrive secondary to multiple medical problems. We will request palliative care consult during this hospitalization for goals of care discussion  History of CVA (cerebrovascular accident) Patient has a history of CVA and is currently bedbound and requires assistance with all activities of daily living. Seen by palliative care during his last hospitalization Patient will need frequent turning since he has a stage II decubitus ulcer involving the right buttock to prevent development of new pressure injuries related to his bedbound state  Hypokalemia Most likely related to GI losses from nausea and vomiting Supplement potassium  Hypothyroidism, unspecified Stable Hold Synthroid for now  Pressure injury of skin Patient noted to have stage II decubitus  ulcer involving the right buttock Wound care consult      Advance Care Planning:   Code Status: DNR   Consults: Gastroenterology, nephrology, palliative care  Family Communication: Greater than 50% of time was spent discussing patient's condition and plan of care with his wife at the bedside.  All questions and concerns have been addressed.  She verbalizes understanding and agrees with the plan.  CODE STATUS was discussed and he is a DO NOT RESUSCITATE  Severity of Illness: The appropriate patient status for this patient is OBSERVATION. Observation status is judged to be reasonable and necessary in order to provide the required intensity of service to ensure the patient's safety. The patient's presenting symptoms, physical exam findings, and initial radiographic and laboratory data in the context of their medical condition is felt to place them at decreased risk for further clinical deterioration. Furthermore, it is anticipated that the patient will be medically stable for discharge from the hospital within 2 midnights of admission.   Author: Collier Bullock, MD 01/27/2022 10:23 PM  For on call review [email or web address].

## 2022-01-27 NOTE — Assessment & Plan Note (Signed)
Patient noted to have stage II decubitus ulcer involving the right buttock Wound care consult

## 2022-01-27 NOTE — Assessment & Plan Note (Signed)
Stable Hold Synthroid for now

## 2022-01-27 NOTE — Assessment & Plan Note (Signed)
Patient has a history of CVA and is currently bedbound and requires assistance with all activities of daily living. Seen by palliative care during his last hospitalization Patient will need frequent turning since he has a stage II decubitus ulcer involving the right buttock to prevent development of new pressure injuries related to his bedbound state

## 2022-01-27 NOTE — Assessment & Plan Note (Signed)
Patient with a history of adult failure to thrive secondary to multiple medical problems. We will request palliative care consult during this hospitalization for goals of care discussion

## 2022-01-27 NOTE — Assessment & Plan Note (Signed)
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 Patient was recently hospitalized at [facility] for same We will give a dose of IV Reglan Place patient on scheduled Zofran

## 2022-01-27 NOTE — Assessment & Plan Note (Signed)
Most likely related to GI losses from nausea and vomiting Supplement potassium

## 2022-01-27 NOTE — Assessment & Plan Note (Addendum)
Patient presents to the ER for evaluation of 2 episodes of hematemesis Hematemesis may be secondary to acute gastritis versus possible Mallory-Weiss tear from intractable nausea and vomiting We will hold aspirin Place patient on IV Protonix 40 mg daily Check serial H&H and transfuse as needed

## 2022-01-28 ENCOUNTER — Encounter (HOSPITAL_COMMUNITY): Payer: Self-pay | Admitting: Internal Medicine

## 2022-01-28 DIAGNOSIS — E440 Moderate protein-calorie malnutrition: Secondary | ICD-10-CM | POA: Diagnosis present

## 2022-01-28 DIAGNOSIS — K650 Generalized (acute) peritonitis: Secondary | ICD-10-CM | POA: Diagnosis present

## 2022-01-28 DIAGNOSIS — R1084 Generalized abdominal pain: Secondary | ICD-10-CM | POA: Diagnosis not present

## 2022-01-28 DIAGNOSIS — R112 Nausea with vomiting, unspecified: Secondary | ICD-10-CM | POA: Diagnosis not present

## 2022-01-28 DIAGNOSIS — E782 Mixed hyperlipidemia: Secondary | ICD-10-CM | POA: Diagnosis present

## 2022-01-28 DIAGNOSIS — Z515 Encounter for palliative care: Secondary | ICD-10-CM | POA: Diagnosis not present

## 2022-01-28 DIAGNOSIS — R1311 Dysphagia, oral phase: Secondary | ICD-10-CM | POA: Diagnosis not present

## 2022-01-28 DIAGNOSIS — I4892 Unspecified atrial flutter: Secondary | ICD-10-CM | POA: Diagnosis present

## 2022-01-28 DIAGNOSIS — I272 Pulmonary hypertension, unspecified: Secondary | ICD-10-CM | POA: Diagnosis present

## 2022-01-28 DIAGNOSIS — Y841 Kidney dialysis as the cause of abnormal reaction of the patient, or of later complication, without mention of misadventure at the time of the procedure: Secondary | ICD-10-CM | POA: Diagnosis present

## 2022-01-28 DIAGNOSIS — B9561 Methicillin susceptible Staphylococcus aureus infection as the cause of diseases classified elsewhere: Secondary | ICD-10-CM | POA: Diagnosis present

## 2022-01-28 DIAGNOSIS — K920 Hematemesis: Secondary | ICD-10-CM | POA: Diagnosis present

## 2022-01-28 DIAGNOSIS — J449 Chronic obstructive pulmonary disease, unspecified: Secondary | ICD-10-CM | POA: Diagnosis present

## 2022-01-28 DIAGNOSIS — L89312 Pressure ulcer of right buttock, stage 2: Secondary | ICD-10-CM | POA: Diagnosis present

## 2022-01-28 DIAGNOSIS — Z7189 Other specified counseling: Secondary | ICD-10-CM

## 2022-01-28 DIAGNOSIS — Z66 Do not resuscitate: Secondary | ICD-10-CM

## 2022-01-28 DIAGNOSIS — I120 Hypertensive chronic kidney disease with stage 5 chronic kidney disease or end stage renal disease: Secondary | ICD-10-CM | POA: Diagnosis present

## 2022-01-28 DIAGNOSIS — I69322 Dysarthria following cerebral infarction: Secondary | ICD-10-CM | POA: Diagnosis not present

## 2022-01-28 DIAGNOSIS — E039 Hypothyroidism, unspecified: Secondary | ICD-10-CM | POA: Diagnosis present

## 2022-01-28 DIAGNOSIS — N186 End stage renal disease: Secondary | ICD-10-CM | POA: Diagnosis present

## 2022-01-28 DIAGNOSIS — R778 Other specified abnormalities of plasma proteins: Secondary | ICD-10-CM | POA: Diagnosis not present

## 2022-01-28 DIAGNOSIS — K922 Gastrointestinal hemorrhage, unspecified: Secondary | ICD-10-CM | POA: Diagnosis present

## 2022-01-28 DIAGNOSIS — R532 Functional quadriplegia: Secondary | ICD-10-CM | POA: Diagnosis present

## 2022-01-28 DIAGNOSIS — R627 Adult failure to thrive: Secondary | ICD-10-CM | POA: Diagnosis not present

## 2022-01-28 DIAGNOSIS — Z992 Dependence on renal dialysis: Secondary | ICD-10-CM | POA: Diagnosis not present

## 2022-01-28 DIAGNOSIS — D62 Acute posthemorrhagic anemia: Secondary | ICD-10-CM | POA: Diagnosis present

## 2022-01-28 DIAGNOSIS — I248 Other forms of acute ischemic heart disease: Secondary | ICD-10-CM | POA: Diagnosis present

## 2022-01-28 DIAGNOSIS — R1312 Dysphagia, oropharyngeal phase: Secondary | ICD-10-CM

## 2022-01-28 DIAGNOSIS — E860 Dehydration: Secondary | ICD-10-CM | POA: Diagnosis present

## 2022-01-28 DIAGNOSIS — I080 Rheumatic disorders of both mitral and aortic valves: Secondary | ICD-10-CM | POA: Diagnosis present

## 2022-01-28 DIAGNOSIS — T8029XA Infection following other infusion, transfusion and therapeutic injection, initial encounter: Secondary | ICD-10-CM | POA: Diagnosis present

## 2022-01-28 DIAGNOSIS — E871 Hypo-osmolality and hyponatremia: Secondary | ICD-10-CM | POA: Diagnosis present

## 2022-01-28 DIAGNOSIS — F32A Depression, unspecified: Secondary | ICD-10-CM | POA: Diagnosis present

## 2022-01-28 LAB — BASIC METABOLIC PANEL
Anion gap: 11 (ref 5–15)
BUN: 36 mg/dL — ABNORMAL HIGH (ref 8–23)
CO2: 25 mmol/L (ref 22–32)
Calcium: 9 mg/dL (ref 8.9–10.3)
Chloride: 96 mmol/L — ABNORMAL LOW (ref 98–111)
Creatinine, Ser: 5.49 mg/dL — ABNORMAL HIGH (ref 0.61–1.24)
GFR, Estimated: 10 mL/min — ABNORMAL LOW (ref >60)
Glucose, Bld: 107 mg/dL — ABNORMAL HIGH (ref 70–99)
Potassium: 3.2 mmol/L — ABNORMAL LOW (ref 3.5–5.1)
Sodium: 132 mmol/L — ABNORMAL LOW (ref 135–145)

## 2022-01-28 LAB — HEMOGLOBIN AND HEMATOCRIT, BLOOD
HCT: 35.7 % — ABNORMAL LOW (ref 39.0–52.0)
HCT: 36.9 % — ABNORMAL LOW (ref 39.0–52.0)
HCT: 38.1 % — ABNORMAL LOW (ref 39.0–52.0)
Hemoglobin: 12.7 g/dL — ABNORMAL LOW (ref 13.0–17.0)
Hemoglobin: 12.9 g/dL — ABNORMAL LOW (ref 13.0–17.0)
Hemoglobin: 13.4 g/dL (ref 13.0–17.0)

## 2022-01-28 LAB — CBG MONITORING, ED: Glucose-Capillary: 100 mg/dL — ABNORMAL HIGH (ref 70–99)

## 2022-01-28 MED ORDER — GENTAMICIN SULFATE 0.1 % EX CREA
1.0000 | TOPICAL_CREAM | Freq: Every day | CUTANEOUS | Status: DC
  Filled 2022-01-28: qty 15

## 2022-01-28 MED ORDER — DELFLEX-LC/1.5% DEXTROSE 344 MOSM/L IP SOLN: Freq: Every day | INTRAPERITONEAL | Status: DC

## 2022-01-28 MED ORDER — SUCRALFATE 1 GM/10ML PO SUSP
1.0000 g | Freq: Two times a day (BID) | ORAL | Status: DC
  Administered 2022-01-29 – 2022-02-02 (×9): 1 g via ORAL
  Filled 2022-01-28 (×7): qty 10

## 2022-01-28 NOTE — Consult Note (Signed)
Reason for Consult: ESRD Referring Physician:  Dr Marthenia Rolling  Chief Complaint: Hematemesis  Dialysis Orders:  [facility] 5 cycles over 11 hours 1.5%. 3L fill. Last fill icodextran 2.5L.  Outpatient PD RN Malachy Mood [phone]   Assessment/Plan: Hematemesis - Per primary.  ESRD -  Continue CCPD. No signs of peritonitis but will send fluid for analysis; per spouse it is cloudy initially 1st couple drains and then clears up (fibrin?). It's been studied and they haven't been told he had peritonitis. 1.5% bags for now; we don't have Icodextran here; therefore, no day dwell here. Unable to complete treatment last night d/t hematemesis x2 (only had 1 fill).  Hypertension/volume  - Blood pressure ok. No gross volume on exam.  Anemia  - Hgb >12 No ESA needs  Metabolic bone disease -  Corr Ca slightly elevated; will check phos. No binders for now. Nutrition - Dysphagia diet usually. H/o CVA with dysarthria, some left upper arm weakness, cognitive challenges as well. FTT/Weight loss - Significant weight loss and progressive weakness noted. Palliative care has been consulted. Guarded prognosis. DNR.   HPI: [name] is an 76 y.o. male HTN, CVA with residual dysarthria + dysphagia, bedbound on pureed diet, AVR, a flutter, CASHD, h/o PE, hypothyroidism, ESRD on PD. His spouse is his caregiver and she states that she had filled the abdomen with 3L 1.5% and then had to stop because of hematemesis x2 with bright red blood. He has had anorexia with nausea and vomiting with associated periumbilical pain in the past few days. She has also noted gargling recently and thought that was from the oropharyngeal dysphagia. He has been treated with Zofran for nausea in the past.   ROS Pertinent items are noted in HPI.  Chemistry and CBC: Creatinine  Date/Time Value Ref Range Status  01/26/2019 12:00 AM 2.6 (A) 0.6 - 1.3 Final   Creat  Date/Time Value Ref Range Status  06/09/2012 04:27 PM 1.45 (H) 0.50  - 1.35 mg/dL Final   Creatinine, Ser  Date/Time Value Ref Range Status  01/28/2022 04:43 AM 5.49 (H) 0.61 - 1.24 mg/dL Final  01/27/2022 07:57 PM 5.40 (H) 0.61 - 1.24 mg/dL Final  01/03/2022 11:10 AM 7.65 (H) 0.61 - 1.24 mg/dL Final  01/02/2022 01:12 AM 7.53 (H) 0.61 - 1.24 mg/dL Final  01/01/2022 07:11 PM 7.10 (H) 0.61 - 1.24 mg/dL Final  01/01/2022 11:16 AM 6.82 (H) 0.61 - 1.24 mg/dL Final  12/17/2021 02:59 AM 5.57 (H) 0.61 - 1.24 mg/dL Final  12/16/2021 04:18 AM 5.83 (H) 0.61 - 1.24 mg/dL Final  12/15/2021 04:10 AM 6.28 (H) 0.61 - 1.24 mg/dL Final  12/14/2021 05:58 AM 6.53 (H) 0.61 - 1.24 mg/dL Final  12/13/2021 11:50 PM 6.74 (H) 0.61 - 1.24 mg/dL Final  11/19/2021 06:14 PM 8.39 (H) 0.61 - 1.24 mg/dL Final  06/27/2021 12:11 PM 9.70 (H) 0.61 - 1.24 mg/dL Final  06/09/2021 11:19 AM 8.60 (H) 0.61 - 1.24 mg/dL Final  07/23/2020 01:37 PM 4.43 (H) 0.61 - 1.24 mg/dL Final  07/18/2020 03:05 AM 4.90 (H) 0.61 - 1.24 mg/dL Final  07/17/2020 12:10 AM 5.67 (H) 0.61 - 1.24 mg/dL Final  07/16/2020 08:07 AM 5.80 (H) 0.61 - 1.24 mg/dL Final  07/16/2020 02:46 AM 5.90 (H) 0.61 - 1.24 mg/dL Final  07/15/2020 06:10 AM 5.76 (H) 0.61 - 1.24 mg/dL Final  07/14/2020 12:21 PM 5.59 (H) 0.61 - 1.24 mg/dL Final  06/13/2020 03:26 AM 7.39 (H) 0.61 - 1.24 mg/dL Final  06/12/2020 04:42 AM 7.24 (H)  0.61 - 1.24 mg/dL Final  06/11/2020 01:25 AM 6.85 (H) 0.61 - 1.24 mg/dL Final  06/10/2020 05:17 AM 6.52 (H) 0.61 - 1.24 mg/dL Final  06/09/2020 10:12 AM 6.06 (H) 0.61 - 1.24 mg/dL Final  06/09/2020 04:15 AM 5.72 (H) 0.61 - 1.24 mg/dL Final  06/08/2020 04:21 PM 5.53 (H) 0.61 - 1.24 mg/dL Final  05/27/2020 03:33 PM 5.40 (H) 0.61 - 1.24 mg/dL Final  02/05/2020 02:05 PM 3.63 (H) 0.61 - 1.24 mg/dL Final  07/15/2012 04:15 AM 1.51 (H) 0.50 - 1.35 mg/dL Final  07/14/2012 05:00 AM 1.82 (H) 0.50 - 1.35 mg/dL Final  07/13/2012 04:09 AM 1.96 (H) 0.50 - 1.35 mg/dL Final  07/12/2012 05:30 PM 1.90 (H) 0.50 - 1.35 mg/dL Final   07/12/2012 05:20 PM 1.94 (H) 0.50 - 1.35 mg/dL Final  07/12/2012 03:00 AM 2.02 (H) 0.50 - 1.35 mg/dL Final  07/11/2012 09:17 PM 1.90 (H) 0.50 - 1.35 mg/dL Final  07/11/2012 09:00 PM 2.03 (H) 0.50 - 1.35 mg/dL Final  07/07/2012 03:13 PM 1.86 (H) 0.50 - 1.35 mg/dL Final  06/06/2009 04:57 AM 1.67 (H) 0.4 - 1.5 mg/dL Final  05/30/2009 02:58 PM 1.21 0.4 - 1.5 mg/dL Final  04/30/2009 04:45 AM 1.72 (H) 0.4 - 1.5 mg/dL Final  04/24/2009 01:28 PM 1.25 0.4 - 1.5 mg/dL Final  10/18/2008 04:00 AM 1.06 0.4 - 1.5 mg/dL Final  10/11/2008 09:46 AM 1.29 0.4 - 1.5 mg/dL Final   Recent Labs  Lab 01/27/22 1957 01/28/22 0443  NA 132* 132*  K 3.1* 3.2*  CL 94* 96*  CO2 25 25  GLUCOSE 161* 107*  BUN 33* 36*  CREATININE 5.40* 5.49*  CALCIUM 9.3 9.0   Recent Labs  Lab 01/27/22 1957 01/27/22 2352 01/28/22 0747  WBC 11.4*  --   --   NEUTROABS 9.5*  --   --   HGB 14.1 13.4 12.7*  HCT 39.8 38.1* 35.7*  MCV 94.8  --   --   PLT 292  --   --    Liver Function Tests: Recent Labs  Lab 01/27/22 1957  AST 22  ALT 15  ALKPHOS 92  BILITOT 0.5  PROT 6.9  ALBUMIN 2.5*   No results for input(s): "LIPASE", "AMYLASE" in the last 168 hours. No results for input(s): "AMMONIA" in the last 168 hours. Cardiac Enzymes: No results for input(s): "CKTOTAL", "CKMB", "CKMBINDEX", "TROPONINI" in the last 168 hours. Iron Studies: No results for input(s): "IRON", "TIBC", "TRANSFERRIN", "FERRITIN" in the last 72 hours. PT/INR: '@LABRCNTIP'$ (inr:5)  Xrays/Other Studies: ) Results for orders placed or performed during the hospital encounter of 01/27/22 (from the past 48 hour(s))  CBC with Differential     Status: Abnormal   Collection Time: 01/27/22  7:57 PM  Result Value Ref Range   WBC 11.4 (H) 4.0 - 10.5 K/uL   RBC 4.20 (L) 4.22 - 5.81 MIL/uL   Hemoglobin 14.1 13.0 - 17.0 g/dL   HCT 39.8 39.0 - 52.0 %   MCV 94.8 80.0 - 100.0 fL   MCH 33.6 26.0 - 34.0 pg   MCHC 35.4 30.0 - 36.0 g/dL   RDW 13.0 11.5 - 15.5  %   Platelets 292 150 - 400 K/uL   nRBC 0.0 0.0 - 0.2 %   Neutrophils Relative % 82 %   Neutro Abs 9.5 (H) 1.7 - 7.7 K/uL   Lymphocytes Relative 10 %   Lymphs Abs 1.1 0.7 - 4.0 K/uL   Monocytes Relative 3 %   Monocytes Absolute 0.3 0.1 -  1.0 K/uL   Eosinophils Relative 0 %   Eosinophils Absolute 0.0 0.0 - 0.5 K/uL   Basophils Relative 1 %   Basophils Absolute 0.1 0.0 - 0.1 K/uL   Immature Granulocytes 4 %   Abs Immature Granulocytes 0.43 (H) 0.00 - 0.07 K/uL    Comment: Performed at [facility], [address], [place], [place] [address]  Comprehensive metabolic panel     Status: Abnormal   Collection Time: 01/27/22  7:57 PM  Result Value Ref Range   Sodium 132 (L) 135 - 145 mmol/L   Potassium 3.1 (L) 3.5 - 5.1 mmol/L   Chloride 94 (L) 98 - 111 mmol/L   CO2 25 22 - 32 mmol/L   Glucose, Bld 161 (H) 70 - 99 mg/dL    Comment: Glucose reference range applies only to samples taken after fasting for at least 8 hours.   BUN 33 (H) 8 - 23 mg/dL   Creatinine, Ser 5.40 (H) 0.61 - 1.24 mg/dL   Calcium 9.3 8.9 - 10.3 mg/dL   Total Protein 6.9 6.5 - 8.1 g/dL   Albumin 2.5 (L) 3.5 - 5.0 g/dL   AST 22 15 - 41 U/L   ALT 15 0 - 44 U/L   Alkaline Phosphatase 92 38 - 126 U/L   Total Bilirubin 0.5 0.3 - 1.2 mg/dL   GFR, Estimated 10 (L) >60 mL/min    Comment: (NOTE) Calculated using the CKD-EPI Creatinine Equation (2021)    Anion gap 13 5 - 15    Comment: Performed at [facility], [address], [place], [place] [address]  Type and screen     Status: None   Collection Time: 01/27/22  8:04 PM  Result Value Ref Range   ABO/RH(D) A NEG    Antibody Screen NEG    Sample Expiration      01/30/2022,2359 Performed at [facility], [address], [place], [place] [address]   Hemoglobin and hematocrit, blood     Status: Abnormal   Collection Time: 01/27/22 11:52 PM  Result Value Ref Range   Hemoglobin 13.4 13.0 - 17.0 g/dL   HCT 38.1 (L) 39.0 - 52.0 %    Comment: Performed at [facility], [address] [place], [place] [address]  Basic metabolic panel     Status: Abnormal   Collection Time: 01/28/22  4:43 AM  Result Value Ref Range   Sodium 132 (L) 135 - 145 mmol/L   Potassium 3.2 (L) 3.5 - 5.1 mmol/L   Chloride 96 (L) 98 - 111 mmol/L   CO2 25 22 - 32 mmol/L   Glucose, Bld 107 (H) 70 - 99 mg/dL    Comment: Glucose reference range applies only to samples taken after fasting for at least 8 hours.   BUN 36 (H) 8 - 23 mg/dL   Creatinine, Ser 5.49 (H) 0.61 - 1.24 mg/dL   Calcium 9.0 8.9 - 10.3 mg/dL   GFR, Estimated 10 (L) >60 mL/min    Comment: (NOTE) Calculated using the CKD-EPI Creatinine Equation (2021)    Anion gap 11 5 - 15    Comment: Performed at [facility], [address], [place], [place] [address]  Hemoglobin and hematocrit, blood     Status: Abnormal   Collection Time: 01/28/22  7:47 AM  Result Value Ref Range   Hemoglobin 12.7 (L) 13.0 - 17.0 g/dL   HCT 35.7 (L) 39.0 - 52.0 %    Comment: Performed at [facility], [address] [place],  [place] [address]  CBG monitoring, ED     Status: Abnormal   Collection Time: 01/28/22  9:53 AM  Result Value Ref Range   Glucose-Capillary 100 (H) 70 - 99 mg/dL    Comment: Glucose reference range applies only to samples taken after fasting for at least 8 hours.   CT ABDOMEN PELVIS WO CONTRAST  Result Date: 01/27/2022 CLINICAL DATA:  Abdominal pain and hematemesis, initial encounter EXAM: CT ABDOMEN AND PELVIS WITHOUT CONTRAST TECHNIQUE: Multidetector CT imaging of the abdomen and pelvis was performed following the standard protocol without IV contrast. RADIATION DOSE REDUCTION: This exam was performed according to the departmental dose-optimization program which includes automated exposure control, adjustment of the mA and/or kV according to patient size and/or use of iterative reconstruction technique. COMPARISON:  01/03/2022 FINDINGS: Lower chest: Emphysematous changes are noted. Chronic scarring is noted in the bases  particularly on the left stable from the prior exam. Hepatobiliary: Liver demonstrates minimal nodularity. No focal mass is seen. The gallbladder is within normal limits. Pancreas: Unremarkable. No pancreatic ductal dilatation or surrounding inflammatory changes. Spleen: Normal in size without focal abnormality. Adrenals/Urinary Tract: Adrenal glands are within normal limits. The kidneys are well visualize without renal calculi or urinary tract obstructive changes. Vascular calcifications are seen. The bladder is partially distended. Stomach/Bowel: No obstructive or inflammatory changes of the colon are seen. The appendix is well visualized and within normal limits. No inflammatory changes to suggest appendicitis are noted. Small bowel and stomach are unremarkable. Vascular/Lymphatic: Aortic atherosclerosis. No enlarged abdominal or pelvic lymph nodes. Reproductive: Prostate is unremarkable. Other: Considerable free fluid is noted throughout the abdomen consistent with the known history of peritoneal dialysis. The peritoneal dialysis catheter is noted deep in the pelvis. No focal herniation is noted. Musculoskeletal: Degenerative changes of lumbar spine are noted. IMPRESSION: Peritoneal dialysis catheter with evidence of dialysate throughout the abdomen. No acute abnormality is identified correspond with the given clinical history. Electronically Signed   By: Inez Catalina M.D.   On: 01/27/2022 20:35    PMH:   Past Medical History:  Diagnosis Date   Aortic stenosis    Status post AVR, 25 mm Edwards pericardial Magna-Ease valve 2014   Arthritis    CAD (coronary artery disease)    Status post SVG to OM 2014   COPD (chronic obstructive pulmonary disease) ([place])    Depression    Dialysis patient ([place])    on PD 7 night weekly at home   ESRD on peritoneal dialysis [facility])    Essential hypertension    Gout    Hemorrhoid    Hypertension    Hypothyroidism    Kidney stones    Lumbar spondylolysis    Mixed  hyperlipidemia    Orthostatic hypotension    Osteoarthritis    Pulmonary emboli ([place]) 08/21/2020   Syncope    Vitamin D deficiency     PSH:   Past Surgical History:  Procedure Laterality Date   ANGIOPASTY     AORTIC VALVE REPLACEMENT  07/11/2012   Procedure: AORTIC VALVE REPLACEMENT (AVR);  Surgeon: Gaye Pollack, MD;  Location: [place];  Service: Open Heart Surgery;  Laterality: N/A;   AV FISTULA PLACEMENT Left 05/30/2020   Procedure: ARTERIOVENOUS (AV) FISTULA CREATION LEFT;  Surgeon: [name], MD;  Location: [facility];  Service: Vascular;  Laterality: Left;   CARDIAC CATHETERIZATION     CAROTID ENDARTERECTOMY Left    CATARACT EXTRACTION W/PHACO Right 06/09/2021   Procedure: CATARACT EXTRACTION RIGHT EYE W/  PHACO AND INTRAOCULAR LENS PLACEMENT (IOC);  Surgeon: Baruch Goldmann, MD;  Location: [facility];  Service: Ophthalmology;  Laterality: Right;  CDE: 12.97   CATARACT EXTRACTION W/PHACO Left 06/27/2021   Procedure: CATARACT EXTRACTION PHACO AND INTRAOCULAR LENS PLACEMENT ([place]) WITH IACCESS GONIOTOMY;  Surgeon: Baruch Goldmann, MD;  Location: [facility];  Service: Ophthalmology;  Laterality: Left;  CDE: 10.08   CIRCUMCISION     COLONOSCOPY WITH PROPOFOL N/A 07/16/2020   Procedure: COLONOSCOPY WITH PROPOFOL;  Surgeon: Doran Stabler, MD;  Location: [place];  Service: Gastroenterology;  Laterality: N/A;   CORONARY ARTERY BYPASS GRAFT  07/11/2012   Procedure: CORONARY ARTERY BYPASS GRAFTING (CABG);  Surgeon: Gaye Pollack, MD;  Location: [place];  Service: Open Heart Surgery;  Laterality: N/A;  CABG x one,  using right leg greater saphenous vein harvested endoscopically   ESOPHAGOGASTRODUODENOSCOPY (EGD) WITH PROPOFOL N/A 07/15/2020   Procedure: ESOPHAGOGASTRODUODENOSCOPY (EGD) WITH PROPOFOL;  Surgeon: Doran Stabler, MD;  Location: [place];  Service: Gastroenterology;  Laterality: N/A;   HOT HEMOSTASIS N/A 07/16/2020   Procedure: HOT HEMOSTASIS (ARGON PLASMA COAGULATION/BICAP);   Surgeon: Doran Stabler, MD;  Location: [place];  Service: Gastroenterology;  Laterality: N/A;   INSERTION OF ANTERIOR SEGMENT AQUEOUS DRAINAGE DEVICE (ISTENT) Right 06/09/2021   Procedure: INSERTION OF RIGHT EYE ANTERIOR SEGMENT AQUEOUS DRAINAGE DEVICE (ISTENT);  Surgeon: Baruch Goldmann, MD;  Location: [facility];  Service: Ophthalmology;  Laterality: Right;  CDE: 12.97   INTRAOPERATIVE TRANSESOPHAGEAL ECHOCARDIOGRAM  07/11/2012   Procedure: INTRAOPERATIVE TRANSESOPHAGEAL ECHOCARDIOGRAM;  Surgeon: Gaye Pollack, MD;  Location: [facility];  Service: Open Heart Surgery;  Laterality: N/A;   IR PERC TUN PERIT CATH WO PORT S&I /IMAG  06/13/2020   IR REMOVAL TUN CV CATH W/O FL  07/26/2020   IR US GUIDE VASC ACCESS RIGHT  06/13/2020   JOINT REPLACEMENT Bilateral    knees   TEE WITHOUT CARDIOVERSION N/A 06/12/2020   Procedure: TRANSESOPHAGEAL ECHOCARDIOGRAM (TEE);  Surgeon: Skeet Latch, MD;  Location: [facility] ENDOSCOPY;  Service: Cardiovascular;  Laterality: N/A;    Allergies: No Known Allergies  Medications:   Prior to Admission medications   Medication Sig Start Date End Date Taking? Authorizing Provider  aspirin EC 81 MG tablet Take 81 mg by mouth daily. Swallow whole.   Yes [provider]  B Complex-C-Folic Acid (RENO CAPS) 1 MG CAPS Take by mouth.   Yes [provider]  collagenase (SANTYL) 250 UNIT/GM ointment Apply 1 application. topically daily as needed (sore).   Yes [provider]  cyanocobalamin (,VITAMIN B-12,) 1000 MCG/ML injection Inject 1,000 mcg into the muscle every 30 (thirty) days.   Yes [provider]  docusate sodium (COLACE) 100 MG capsule Take 200 mg by mouth 2 (two) times daily as needed for mild constipation.   Yes [provider]  finasteride (PROSCAR) 5 MG tablet Take 5 mg by mouth daily.   Yes [provider]  levothyroxine (SYNTHROID) 175 MCG tablet Take 175 mcg by mouth daily before breakfast.  05/12/12  Yes  [provider]  ondansetron (ZOFRAN-ODT) 4 MG disintegrating tablet Take 4 mg by mouth every 8 (eight) hours as needed. 01/26/22  Yes [provider]  ondansetron (ZOFRAN-ODT) 8 MG disintegrating tablet Take 1 tablet (8 mg total) by mouth every 8 (eight) hours as needed for nausea or vomiting. 01/04/22  Yes Bonnielee Haff, MD  pantoprazole (PROTONIX) 40 MG tablet Take 40 mg by mouth daily.   Yes [provider]  polyethylene glycol (MIRALAX / GLYCOLAX) 17 g packet Take 17 g by mouth daily. Patient taking differently: Take 17 g by mouth daily as needed for mild constipation or moderate constipation. 06/14/20  Yes Regalado, Belkys A, MD  potassium chloride (KLOR-CON) 10 MEQ tablet Take 10 mEq by mouth 2 (two) times daily. 12/08/21  Yes [provider]  sertraline (ZOLOFT) 50 MG tablet Take 50 mg by mouth daily.   Yes [provider]  simvastatin (ZOCOR) 40 MG tablet Take 1 tablet (40 mg total) by mouth every evening. 11/07/21  Yes Strader, [place] M, PA-C  TRINTELLIX 20 MG TABS tablet Take 20 mg by mouth daily. 03/19/21  Yes [provider]    Discontinued Meds:   Medications Discontinued During This Encounter  Medication Reason   albuterol (PROVENTIL HFA;VENTOLIN HFA) 108 (90 BASE) MCG/ACT inhaler Patient Preference   ALPRAZolam (XANAX) 0.25 MG tablet Patient Preference   fluticasone (FLONASE) 50 MCG/ACT nasal spray Patient Preference   ondansetron (ZOFRAN) tablet 4 mg    ondansetron (ZOFRAN) injection 4 mg     Social History:  reports that he quit smoking about 11 years ago. His smoking use included cigarettes. He has a 150.00 pack-year smoking history. He has quit using smokeless tobacco. He reports that he does not currently use alcohol. He reports that he does not use drugs.  Family History:   Family History  Problem Relation Age of Onset   Heart disease Mother    Heart disease Father    Hypertension Father    Colon cancer Neg Hx     Esophageal cancer Neg Hx    Stomach cancer Neg Hx     Blood pressure 110/72, pulse 68, temperature 98.1 F (36.7 C), temperature source Axillary, resp. rate 12, height '5\' 8"'$  (1.727 m), weight 95 kg, SpO2 96 %. General: Alert, oriented to self  Heart: RRR Lungs: Clear bilaterally  Abdomen: soft, non-tender, PD catheter on right side, no erythema  Extremities: No LE edema  Back: no flank tenderness Neuro: tremors and some weakness left arm, dysarthria GU: condom catheter in place Dialysis Access: PD catheter in place + left Cimino (+bruit)       Dwana Melena, MD 01/28/2022, 3:07 PM

## 2022-01-28 NOTE — Consult Note (Addendum)
[facility] Consult: 2:02 PM 01/28/2022  LOS: 0 days    Referring Provider:  Dr Loann Quill Primary Care Physician:  Celene Squibb, MD Primary Gastroenterologist:  Dr. Wilfrid Lund     Reason for Consultation:  blood in emesis.     HPI: [name] is a 76 y.o. male.  PMH of multiple problems listed below.  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  DVT/PE in 2022.  COPD, chronic outpatient oxygen.  No longer on any coagulation, just 81 mg aspirin.  MGUS.     GI bleed with melena, blood loss anemia in 06/2020.  Received 2 PRBCs for Hgb nadir 5.9. 07/16/2020 EGD.  Normal study. 07/16/2020 colonoscopy with APC eradication of multiple bleeding right colon AVMs.  Third small polyps not removed.  Nonbleeding internal hemorrhoids. ?08/2021 EGD for GI bleeding while at [facility]?, mentioned in NP Guenther's note but do not see any documentation of this on [facility] dc summary.    Late March 2023 admission at [facility].  Dx: Uremic encephalopathy.  Possible UTI.  12/02/2021 office GI visit w NP regarding oropharyngeal dysphagia, constipation.  MD felt constipation multifactorial, advised MiraLAX and glycerin suppositories.  Anemia attributed to multifactorial issues including ESRD.  No specific plans regarding dysphagia. 12/31/21 Video GI office visit w Dr Loletha Carrow re vomiting, nausea, dysphagia, diminished p.o. intake.  Diet was pured.  Recent SLP study showed oropharyngeal dysphagia with aspiration attributed to weakness, advanced medical problems, recurrent CVAs.  Dr. Loletha Carrow also consider possible peritonitis and/or uremia, electrolyte derangements as sources.  Recommended pt go to the hospital.     [facility] June 2023 [facility] admission for peritonitis.  Initial Peritoneal fluid with 8306 nucleated cells.  Repeat fluid analysis with 48 hours w  nucleated cells down to 59.  Admission 7/6 - 7/10.   Vomiting, nausea longstanding, oropharyngeal dysphagia as well as other conditions were felt to be contributor.  7/8 CT scan non-acute: Small HH.  Trace perihepatic free fluid, PD catheter in place.  Finally got palliative care involved who added around-the-clock Zofran.  SLP recommended continue dysphagia 1 diet.  Renal continued him on his PD.  Received dehydration.  RD diagnosed moderate protein calorie malnutrition malnourished, added multivitamin, Magic cup nephro shakes. R Buttock decubitus ulcer.  His peritoneal fluid was not re-analyzed.   Dc w plans for pall care fup.       Presented to ED yest PM after a couple of episodes of bright red blood containing emesis earlier in the day.  Hypertensive at 139/113, tachycardia at 110.  Sats mid to upper 90%.   Hgb 14.1.. 12.7.  WBCs 1.4.  Platelets 290.  INR 1.1 BUNs/creatinine 36/5.4, GFR 10. Na 132.  Potassium 3.1 other than low albumin two-point 01/27/22 CTAP wo contrast: Emphysema, chronic scarring of the lungs.  Minimal nodularity liver.  Stomach, intestine, pancreas.    FF within abdomen cw PD fluid and dialysis catheter noted in the pelvis. Started on Protonix 40 mg IV/24 h.    Pt unable to provide much in the way of  a history for me as he nods his head and can barely vocalize.     Past Medical History:  Diagnosis Date   Aortic stenosis    Status post AVR, 25 mm Edwards pericardial Magna-Ease valve 2014   Arthritis    CAD (coronary artery disease)    Status post SVG to OM 2014   COPD (chronic obstructive pulmonary disease) ([place])    Depression    Dialysis patient ([place])    on PD 7 night weekly at home   ESRD on peritoneal dialysis [facility])    Essential hypertension    Gout    Hemorrhoid    Hypertension    Hypothyroidism    Kidney stones    Lumbar spondylolysis    Mixed hyperlipidemia    Orthostatic hypotension    Osteoarthritis    Pulmonary emboli ([place]) 08/21/2020   Syncope     Vitamin D deficiency     Past Surgical History:  Procedure Laterality Date   ANGIOPASTY     AORTIC VALVE REPLACEMENT  07/11/2012   Procedure: AORTIC VALVE REPLACEMENT (AVR);  Surgeon: Gaye Pollack, MD;  Location: [place];  Service: Open Heart Surgery;  Laterality: N/A;   AV FISTULA PLACEMENT Left 05/30/2020   Procedure: ARTERIOVENOUS (AV) FISTULA CREATION LEFT;  Surgeon: [name], MD;  Location: [facility];  Service: Vascular;  Laterality: Left;   CARDIAC CATHETERIZATION     CAROTID ENDARTERECTOMY Left    CATARACT EXTRACTION W/PHACO Right 06/09/2021   Procedure: CATARACT EXTRACTION RIGHT EYE W/ PHACO AND INTRAOCULAR LENS PLACEMENT ([place]);  Surgeon: Baruch Goldmann, MD;  Location: [facility];  Service: Ophthalmology;  Laterality: Right;  CDE: 12.97   CATARACT EXTRACTION W/PHACO Left 06/27/2021   Procedure: CATARACT EXTRACTION PHACO AND INTRAOCULAR LENS PLACEMENT ([place]) WITH IACCESS GONIOTOMY;  Surgeon: Baruch Goldmann, MD;  Location: [facility];  Service: Ophthalmology;  Laterality: Left;  CDE: 10.08   CIRCUMCISION     COLONOSCOPY WITH PROPOFOL N/A 07/16/2020   Procedure: COLONOSCOPY WITH PROPOFOL;  Surgeon: Doran Stabler, MD;  Location: [place];  Service: Gastroenterology;  Laterality: N/A;   CORONARY ARTERY BYPASS GRAFT  07/11/2012   Procedure: CORONARY ARTERY BYPASS GRAFTING (CABG);  Surgeon: Gaye Pollack, MD;  Location: [place];  Service: Open Heart Surgery;  Laterality: N/A;  CABG x one,  using right leg greater saphenous vein harvested endoscopically   ESOPHAGOGASTRODUODENOSCOPY (EGD) WITH PROPOFOL N/A 07/15/2020   Procedure: ESOPHAGOGASTRODUODENOSCOPY (EGD) WITH PROPOFOL;  Surgeon: Doran Stabler, MD;  Location: [place];  Service: Gastroenterology;  Laterality: N/A;   HOT HEMOSTASIS N/A 07/16/2020   Procedure: HOT HEMOSTASIS (ARGON PLASMA COAGULATION/BICAP);  Surgeon: Doran Stabler, MD;  Location: [place];  Service: Gastroenterology;  Laterality: N/A;   INSERTION  OF ANTERIOR SEGMENT AQUEOUS DRAINAGE DEVICE (ISTENT) Right 06/09/2021   Procedure: INSERTION OF RIGHT EYE ANTERIOR SEGMENT AQUEOUS DRAINAGE DEVICE (ISTENT);  Surgeon: Baruch Goldmann, MD;  Location: [facility];  Service: Ophthalmology;  Laterality: Right;  CDE: 12.97   INTRAOPERATIVE TRANSESOPHAGEAL ECHOCARDIOGRAM  07/11/2012   Procedure: INTRAOPERATIVE TRANSESOPHAGEAL ECHOCARDIOGRAM;  Surgeon: Gaye Pollack, MD;  Location: [place] [facility];  Service: Open Heart Surgery;  Laterality: N/A;   IR PERC TUN PERIT CATH WO PORT S&I /IMAG  06/13/2020   IR REMOVAL TUN CV CATH W/O FL  07/26/2020   IR US GUIDE VASC ACCESS RIGHT  06/13/2020   JOINT REPLACEMENT Bilateral    knees   TEE WITHOUT CARDIOVERSION N/A 06/12/2020   Procedure: TRANSESOPHAGEAL ECHOCARDIOGRAM (  TEE);  Surgeon: Skeet Latch, MD;  Location: [place];  Service: Cardiovascular;  Laterality: N/A;    Prior to Admission medications   Medication Sig Start Date End Date Taking? Authorizing Provider  aspirin EC 81 MG tablet Take 81 mg by mouth daily. Swallow whole.   Yes [provider]  B Complex-C-Folic Acid (RENO CAPS) 1 MG CAPS Take by mouth.   Yes [provider]  collagenase (SANTYL) 250 UNIT/GM ointment Apply 1 application. topically daily as needed (sore).   Yes [provider]  cyanocobalamin (,VITAMIN B-12,) 1000 MCG/ML injection Inject 1,000 mcg into the muscle every 30 (thirty) days.   Yes [provider]  docusate sodium (COLACE) 100 MG capsule Take 200 mg by mouth 2 (two) times daily as needed for mild constipation.   Yes [provider]  finasteride (PROSCAR) 5 MG tablet Take 5 mg by mouth daily.   Yes [provider]  levothyroxine (SYNTHROID) 175 MCG tablet Take 175 mcg by mouth daily before breakfast.  05/12/12  Yes [provider]  ondansetron (ZOFRAN-ODT) 4 MG disintegrating tablet Take 4 mg by mouth every 8 (eight) hours as needed. 01/26/22  Yes [provider]   ondansetron (ZOFRAN-ODT) 8 MG disintegrating tablet Take 1 tablet (8 mg total) by mouth every 8 (eight) hours as needed for nausea or vomiting. 01/04/22  Yes Bonnielee Haff, MD  pantoprazole (PROTONIX) 40 MG tablet Take 40 mg by mouth daily.   Yes [provider]  polyethylene glycol (MIRALAX / GLYCOLAX) 17 g packet Take 17 g by mouth daily. Patient taking differently: Take 17 g by mouth daily as needed for mild constipation or moderate constipation. 06/14/20  Yes Regalado, Belkys A, MD  potassium chloride (KLOR-CON) 10 MEQ tablet Take 10 mEq by mouth 2 (two) times daily. 12/08/21  Yes [provider]  sertraline (ZOLOFT) 50 MG tablet Take 50 mg by mouth daily.   Yes [provider]  simvastatin (ZOCOR) 40 MG tablet Take 1 tablet (40 mg total) by mouth every evening. 11/07/21  Yes Strader, [place] M, PA-C  TRINTELLIX 20 MG TABS tablet Take 20 mg by mouth daily. 03/19/21  Yes [provider]    Scheduled Meds:  ondansetron (ZOFRAN) IV  4 mg Intravenous Q6H   pantoprazole (PROTONIX) IV  40 mg Intravenous Q24H   sodium chloride flush  3 mL Intravenous Q12H   Infusions:  sodium chloride     PRN Meds: sodium chloride, sodium chloride flush   Allergies as of 01/27/2022   (No Known Allergies)    Family History  Problem Relation Age of Onset   Heart disease Mother    Heart disease Father    Hypertension Father    Colon cancer Neg Hx    Esophageal cancer Neg Hx    Stomach cancer Neg Hx     Social History   Socioeconomic History   Marital status: Married    Spouse name: Not on file   Number of children: 1   Years of education: HS   Highest education level: Not on file  Occupational History   Occupation: Retired   Tobacco Use   Smoking status: Former    Packs/day: 3.00    Years: 50.00    Total pack years: 150.00    Types: Cigarettes    Quit date: 12/13/2010    Years since quitting: 11.1   Smokeless tobacco: Former   Tobacco comments:     Electronic cigarette...USES INFREQUENTLY NOW  Vaping Use  Vaping Use: Former   Start date: 02/01/2013   Quit date: 06/08/2020  Substance and Sexual Activity   Alcohol use: Not Currently    Comment: Rare   Drug use: No   Sexual activity: Yes  Other Topics Concern   Not on file  Social History Narrative   Drinks about 3 cups of coffee a day, drinks about 2 sundrops a day    Social Determinants of Health   Financial Resource Strain: Not on file  Food Insecurity: Not on file  Transportation Needs: Not on file  Physical Activity: Not on file  Stress: Not on file  Social Connections: Not on file  Intimate Partner Violence: Not on file    REVIEW OF SYSTEMS: All of this was gleaned from Constitutional: Bedbound at baseline ENT:  No nose bleeds Pulm:  + Cough. CV:  No palpitations, no LE edema.  GU:  No hematuria, no frequency GI: As per HPI Heme: No unusual bleeding or bruising Transfusions: PRBC in 06/2020. Neuro: Peripheral neuropathy.  No reports of syncope or seizure Derm:  No itching, no rash or sores.  Endocrine:  No sweats or chills.  No polyuria or dysuria Immunization: Reviewed. Travel:  None beyond local counties for medical care in last few months.    PHYSICAL EXAM: Vital signs in last 24 hours: Vitals:   01/28/22 1118 01/28/22 1206  BP: 110/76 110/72  Pulse: 74 68  Resp: 16 12  Temp:  98.1 F (36.7 C)  SpO2: 96% 96%   Wt Readings from Last 3 Encounters:  01/27/22 95 kg  01/02/22 95 kg  12/31/21 102.1 kg    General: Ill-appearing.  Garbled speech difficult to understand. Head: No facial asymmetry or swelling.  No signs of head trauma. Eyes: No conjunctival pallor Ears: Does not appear to have hearing difficulties Nose: No congestion or discharge Mouth: Tongue midline.  There is nonspecific white coating, does not look like Candida, on the tongue.  Edentulous.  Mucosa pink, moist, clear. Neck: No JVD, no masses, no thyromegaly Lungs: Rhonchi  bilaterally.  Wet, congested, rattling, mucoid cough.  Sounds of the cough suggestive of inability to effectively clear secretions. Heart: RRR. Abdomen: Soft without tenderness.  PD catheter in the right mid abdomen, site benign..   Rectal: Soft brown stool tests FOBT negative.  No masses Musc/Skeltl: No joint redness, swelling or gross deformity Extremities: No CCE. Neurologic: Hard to tell how oriented he is.  He is not able to verbalize where he is, the year or say his name.  Follows most not all simple commands.  No gross deficits but strength and limb function not tested Skin: No telangiectasia.  No significant bruising on trunk or limbs.   Psych: Flat affect.  Not anxious or agitated  Intake/Output from previous day: 08/01 0701 - 08/02 0700 In: 200 [IV Piggyback:200] Out: -  Intake/Output this shift: No intake/output data recorded.  LAB RESULTS: Recent Labs    01/27/22 1957 01/27/22 2352 01/28/22 0747  WBC 11.4*  --   --   HGB 14.1 13.4 12.7*  HCT 39.8 38.1* 35.7*  PLT 292  --   --    BMET Lab Results  Component Value Date   NA 132 (L) 01/28/2022   NA 132 (L) 01/27/2022   NA 136 01/03/2022   K 3.2 (L) 01/28/2022   K 3.1 (L) 01/27/2022   K 3.6 01/03/2022   CL 96 (L) 01/28/2022   CL 94 (L) 01/27/2022   CL 94 (L) 01/03/2022  CO2 25 01/28/2022   CO2 25 01/27/2022   CO2 27 01/03/2022   GLUCOSE 107 (H) 01/28/2022   GLUCOSE 161 (H) 01/27/2022   GLUCOSE 101 (H) 01/03/2022   BUN 36 (H) 01/28/2022   BUN 33 (H) 01/27/2022   BUN 41 (H) 01/03/2022   CREATININE 5.49 (H) 01/28/2022   CREATININE 5.40 (H) 01/27/2022   CREATININE 7.65 (H) 01/03/2022   CALCIUM 9.0 01/28/2022   CALCIUM 9.3 01/27/2022   CALCIUM 9.6 01/03/2022   LFT Recent Labs    01/27/22 1957  PROT 6.9  ALBUMIN 2.5*  AST 22  ALT 15  ALKPHOS 92  BILITOT 0.5   PT/INR Lab Results  Component Value Date   INR 1.1 12/13/2021   INR 1.5 (H) 07/14/2020   INR 1.1 02/07/2020   Hepatitis Panel No  results for input(s): "HEPBSAG", "HCVAB", "HEPAIGM", "HEPBIGM" in the last 72 hours. C-Diff No components found for: "CDIFF" Lipase     Component Value Date/Time   LIPASE 38 12/13/2021 2350    Drugs of Abuse  No results found for: "LABOPIA", "COCAINSCRNUR", "LABBENZ", "AMPHETMU", "THCU", "LABBARB"   RADIOLOGY STUDIES: CT ABDOMEN PELVIS WO CONTRAST  Result Date: 01/27/2022 CLINICAL DATA:  Abdominal pain and hematemesis, initial encounter EXAM: CT ABDOMEN AND PELVIS WITHOUT CONTRAST TECHNIQUE: Multidetector CT imaging of the abdomen and pelvis was performed following the standard protocol without IV contrast. RADIATION DOSE REDUCTION: This exam was performed according to the departmental dose-optimization program which includes automated exposure control, adjustment of the mA and/or kV according to patient size and/or use of iterative reconstruction technique. COMPARISON:  01/03/2022 FINDINGS: Lower chest: Emphysematous changes are noted. Chronic scarring is noted in the bases particularly on the left stable from the prior exam. Hepatobiliary: Liver demonstrates minimal nodularity. No focal mass is seen. The gallbladder is within normal limits. Pancreas: Unremarkable. No pancreatic ductal dilatation or surrounding inflammatory changes. Spleen: Normal in size without focal abnormality. Adrenals/Urinary Tract: Adrenal glands are within normal limits. The kidneys are well visualize without renal calculi or urinary tract obstructive changes. Vascular calcifications are seen. The bladder is partially distended. Stomach/Bowel: No obstructive or inflammatory changes of the colon are seen. The appendix is well visualized and within normal limits. No inflammatory changes to suggest appendicitis are noted. Small bowel and stomach are unremarkable. Vascular/Lymphatic: Aortic atherosclerosis. No enlarged abdominal or pelvic lymph nodes. Reproductive: Prostate is unremarkable. Other: Considerable free fluid is noted  throughout the abdomen consistent with the known history of peritoneal dialysis. The peritoneal dialysis catheter is noted deep in the pelvis. No focal herniation is noted. Musculoskeletal: Degenerative changes of lumbar spine are noted. IMPRESSION: Peritoneal dialysis catheter with evidence of dialysate throughout the abdomen. No acute abnormality is identified correspond with the given clinical history. Electronically Signed   By: Inez Catalina M.D.   On: 01/27/2022 20:35     IMPRESSION:     Blood in emesis. Issues w longstanding chronic n/v, dysphagia, abd pain.  Stool light brown and FOBT negative. Suspect retching related esophageal or gastric injury.  Can not ro MWT.   Normal EGD in 06/2020.  Home meds of daily PPI and prn Zofran.  Continues on Protonix 40 IV q 24 h, Zofran '4mg'$  IV q 6h.      ESRD. On PD.  Peritonitis in 11/2021     Multiple advanced medical problems.  Bedbound.  FTT.  Now under hospice care and DNR  Hyponatremia.  Hypokalemia.  Liver nodularity per CT 8/1.  LFTs (x for low albumiin),  INR, platelets normal    OP dysphagia.  Hx CVA.  Follows D1 puree diet.  Diet now clear liquids.      Prot cal manutrition.  Graded moderate in 11/2021, supplements added by RD then.     PLAN:       EGD?? Dr Jerilynn Mages to see pt and decide.  Added Carafate bid after d/w Dr Jerilynn Mages.     Need for telemetry monitor given the patient is DNR??     Pal care/GOC consult in the works.    Azucena Freed  01/28/2022, 2:02 PM Phone [phone]

## 2022-01-28 NOTE — Progress Notes (Signed)
PROGRESS NOTE    [name]  GEZ:[id number] DOB: 10/17/1945 DOA: 01/27/2022 PCP: Celene Squibb, MD    Brief Narrative:  76 y/o male with history of ESRD on PD, history of CVA, chronic dysphagia, presented with nausea, vomiting and 2 episodes of hematemesis. Nephrology and GI consulted. Due to multiple medical issues, palliative care also consulted for goals of care.   Assessment & Plan:   Principal Problem:   GI bleed Active Problems:   Intractable nausea and vomiting   ESRD (end stage renal disease) on dialysis (HCC)   Failure to thrive in adult   History of CVA (cerebrovascular accident)   Hypokalemia   Hypothyroidism, unspecified   Pressure injury of skin   GI bleed Patient presents to the ER for evaluation of 2 episodes of hematemesis Hematemesis may be secondary to acute gastritis versus possible Mallory-Weiss tear from intractable nausea and vomiting We will hold aspirin Place patient on IV Protonix 40 mg daily Check serial H&H and transfuse as needed GI consulted NPO for now   Intractable nausea and vomiting Patient with a 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 Patient was recently hospitalized at [facility] for same We will give a dose of IV Reglan Continue on scheduled Zofran   ESRD (end stage renal disease) on dialysis [facility]) Patient has a history of end-stage renal disease and is on peritoneal dialysis Nephrology consulted   Failure to thrive in adult Patient with a history of adult failure to thrive secondary to multiple medical problems. We will request palliative care consult during this hospitalization for goals of care discussion   History of CVA (cerebrovascular accident) Oropharyngeal dysphagia Patient has a history of CVA and is currently bedbound and requires assistance with all activities of daily living. Seen by palliative care during his last  hospitalization Patient will need frequent turning since he has a stage II decubitus ulcer involving the right buttock to prevent development of new pressure injuries related to his bedbound state   Hypokalemia Most likely related to GI losses from nausea and vomiting Supplement potassium   Hypothyroidism, unspecified Stable Hold Synthroid for now while npo   Pressure injury of skin Patient noted to have stage II decubitus ulcer involving the right buttock, POA Wound care consult   DVT prophylaxis: SCDs Start: 01/27/22 2140  Code Status: DNR Family Communication: updated patient's wife over the phone Disposition Plan: Status is: Observation The patient remains OBS appropriate and will d/c before 2 midnights.     Consultants:  Nephrology Gastroenterology Palliative care  Procedures:    Antimicrobials:      Subjective: Reports no vomiting since coming to hospital, last BM was day before yesterday  Objective: Vitals:   01/28/22 0200 01/28/22 0515 01/28/22 0800 01/28/22 0900  BP: 105/77 116/68 126/72 113/72  Pulse: 96 92 86 86  Resp: '15 18 20 15  '$ Temp:  97.8 F (36.6 C)  98 F (36.7 C)  TempSrc:  Oral    SpO2: 94% 96% 96% 97%  Weight:      Height:        Intake/Output Summary (Last 24 hours) at 01/28/2022 1021 Last data filed at 01/28/2022 0127 Gross per 24 hour  Intake 200 ml  Output --  Net 200 ml   Filed Weights   01/27/22 1910  Weight: 95 kg    Examination:  General exam: Appears calm and comfortable  Respiratory system:  Clear to auscultation. Respiratory effort normal. Cardiovascular system: S1 & S2 heard, RRR. No JVD, murmurs, rubs, gallops or clicks. No pedal edema. Gastrointestinal system: Abdomen is nondistended, soft and nontender. No organomegaly or masses felt. Normal bowel sounds heard. Central nervous system: Alert and oriented. No focal neurological deficits. Extremities: Symmetric 5 x 5 power. Skin: No rashes, lesions or  ulcers Psychiatry: Judgement and insight appear normal. Mood & affect appropriate.     Data Reviewed: I have personally reviewed following labs and imaging studies  CBC: Recent Labs  Lab 01/27/22 1957 01/27/22 2352 01/28/22 0747  WBC 11.4*  --   --   NEUTROABS 9.5*  --   --   HGB 14.1 13.4 12.7*  HCT 39.8 38.1* 35.7*  MCV 94.8  --   --   PLT 292  --   --    Basic Metabolic Panel: Recent Labs  Lab 01/27/22 1957 01/28/22 0443  NA 132* 132*  K 3.1* 3.2*  CL 94* 96*  CO2 25 25  GLUCOSE 161* 107*  BUN 33* 36*  CREATININE 5.40* 5.49*  CALCIUM 9.3 9.0   GFR: Estimated Creatinine Clearance: 12.8 mL/min (A) (by C-G formula based on SCr of 5.49 mg/dL (H)). Liver Function Tests: Recent Labs  Lab 01/27/22 1957  AST 22  ALT 15  ALKPHOS 92  BILITOT 0.5  PROT 6.9  ALBUMIN 2.5*   No results for input(s): "LIPASE", "AMYLASE" in the last 168 hours. No results for input(s): "AMMONIA" in the last 168 hours. Coagulation Profile: No results for input(s): "INR", "PROTIME" in the last 168 hours. Cardiac Enzymes: No results for input(s): "CKTOTAL", "CKMB", "CKMBINDEX", "TROPONINI" in the last 168 hours. BNP (last 3 results) No results for input(s): "PROBNP" in the last 8760 hours. HbA1C: No results for input(s): "HGBA1C" in the last 72 hours. CBG: Recent Labs  Lab 01/28/22 0953  GLUCAP 100*   Lipid Profile: No results for input(s): "CHOL", "HDL", "LDLCALC", "TRIG", "CHOLHDL", "LDLDIRECT" in the last 72 hours. Thyroid Function Tests: No results for input(s): "TSH", "T4TOTAL", "FREET4", "T3FREE", "THYROIDAB" in the last 72 hours. Anemia Panel: No results for input(s): "VITAMINB12", "FOLATE", "FERRITIN", "TIBC", "IRON", "RETICCTPCT" in the last 72 hours. Sepsis Labs: No results for input(s): "PROCALCITON", "LATICACIDVEN" in the last 168 hours.  No results found for this or any previous visit (from the past 240 hour(s)).       Radiology Studies: CT ABDOMEN PELVIS WO  CONTRAST  Result Date: 01/27/2022 CLINICAL DATA:  Abdominal pain and hematemesis, initial encounter EXAM: CT ABDOMEN AND PELVIS WITHOUT CONTRAST TECHNIQUE: Multidetector CT imaging of the abdomen and pelvis was performed following the standard protocol without IV contrast. RADIATION DOSE REDUCTION: This exam was performed according to the departmental dose-optimization program which includes automated exposure control, adjustment of the mA and/or kV according to patient size and/or use of iterative reconstruction technique. COMPARISON:  01/03/2022 FINDINGS: Lower chest: Emphysematous changes are noted. Chronic scarring is noted in the bases particularly on the left stable from the prior exam. Hepatobiliary: Liver demonstrates minimal nodularity. No focal mass is seen. The gallbladder is within normal limits. Pancreas: Unremarkable. No pancreatic ductal dilatation or surrounding inflammatory changes. Spleen: Normal in size without focal abnormality. Adrenals/Urinary Tract: Adrenal glands are within normal limits. The kidneys are well visualize without renal calculi or urinary tract obstructive changes. Vascular calcifications are seen. The bladder is partially distended. Stomach/Bowel: No obstructive or inflammatory changes of the colon are seen. The appendix is well visualized and within normal limits. No  inflammatory changes to suggest appendicitis are noted. Small bowel and stomach are unremarkable. Vascular/Lymphatic: Aortic atherosclerosis. No enlarged abdominal or pelvic lymph nodes. Reproductive: Prostate is unremarkable. Other: Considerable free fluid is noted throughout the abdomen consistent with the known history of peritoneal dialysis. The peritoneal dialysis catheter is noted deep in the pelvis. No focal herniation is noted. Musculoskeletal: Degenerative changes of lumbar spine are noted. IMPRESSION: Peritoneal dialysis catheter with evidence of dialysate throughout the abdomen. No acute abnormality is  identified correspond with the given clinical history. Electronically Signed   By: Inez Catalina M.D.   On: 01/27/2022 20:35        Scheduled Meds:  ondansetron (ZOFRAN) IV  4 mg Intravenous Q6H   pantoprazole (PROTONIX) IV  40 mg Intravenous Q24H   sodium chloride flush  3 mL Intravenous Q12H   Continuous Infusions:  sodium chloride       LOS: 0 days    Time spent: 9mns    JKathie Dike MD [facility]   If 7PM-7AM, please contact night-coverage [email or web address]  01/28/2022, 10:21 AM

## 2022-01-28 NOTE — Progress Notes (Signed)
  Transition of Care [facility]) Screening Note   Patient Details  Name: IRBY FAILS Date of Birth: 14-Jan-1946   Transition of Care [facility]) CM/SW Contact:    Iona Beard, [place] Phone Number: 01/28/2022, 10:42 AM    Transition of Care Department [facility]) has reviewed patient and no TOC needs have been identified at this time. We will continue to monitor patient advancement through interdisciplinary progression rounds. If new patient transition needs arise, please place a TOC consult.

## 2022-01-28 NOTE — Consult Note (Addendum)
Consultation Note Date: 01/28/2022   Patient Name: [name]  DOB: 1945-12-20  MRN: [id number]  Age / Sex: 76 y.o., male  PCP: Celene Squibb, MD Referring Physician: Darliss Cheney, MD  Reason for Consultation: Establishing goals of care  HPI/Patient Profile: 76 y.o. male  with past medical history of ESRD on PD, history of CVA, chronic dysphagia, and chronic N/V admitted on 01/27/2022 with 2 episodes of hematemesis. PMT consulted to discuss [place].    This patient and his wife were seen by the palliative team twice during his previous admission in July. At that time patient was referred to outpatient palliative team through [facility]. Patient not under hospice care. Patient was also started on scheduled zofran for his chronic nausea.   From chart review, seems that nausea is better controlled with patient declining his 12:00 scheduled dose of zofran telling the nurse he did not need it. When I went to patient's room he was sleeping soundly - he did not wake to voice or gentle touch. Per nursing documentation he is disoriented to time & situation.    Clinical Assessment and Goals of Care: I have reviewed medical records including EPIC notes, labs and imaging,  assessed the patient and then spoke with his wife [name]  to discuss diagnosis prognosis, [place], EOL wishes, disposition and options.  I introduced Palliative Medicine as specialized medical care for people living with serious illness. It focuses on providing relief from the symptoms and stress of a serious illness. The goal is to improve quality of life for both the patient and the family.  [name] tells me she is familiar with our team and recalls previous visits with Sharyn Lull, NP during Mr. Meditz's admission in July.   [name] tells me they have been set up with community-based palliative care in [facility].  She tells me they have been visited once by them and it was helpful.  She tells me  the next scheduled visit is for the end of August.  She tells me she believes they will see patient about once a month.   We discussed patient's current illness -wife most concerned with GI input and nephrology input.  I tell her that those notes are not in yet some not sure and we discussed the potential possibility of EGD? She is open to this. She asks about his peritoneal dialysis and we discuss that nephrology has been consulted.   Patient's wife tells me at this time she does not feel they need the support of the inpatient palliative team as she feels well supported by the outpatient palliative team.  She tells me she has her contact information and will reach out to [place] if she would like her support.  Primary Decision Maker NEXT OF KIN -wife [name]    SUMMARY OF RECOMMENDATIONS   Patient seen by palliative provider in early July and is also being seen by outpatient palliative providers through [facility] -wife does not feel that inpatient palliative involvement is needed at this time - she has her contact information and will reach out as needed -Seems that nausea is well controlled per chart review  Code Status/Advance Care Planning: DNR     Primary Diagnoses: Present on Admission:  GI bleed  Hypothyroidism, unspecified  Hypokalemia  Failure to thrive in adult  Intractable nausea and vomiting  Pressure injury of skin  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 The following aspects are pertinent.  Past Medical History:  Diagnosis Date   Aortic stenosis    Status post AVR, 25 mm Edwards pericardial Magna-Ease valve 2014   Arthritis    CAD (coronary artery disease)    Status post SVG to OM 2014   COPD (chronic obstructive pulmonary disease) (HCC)    Depression    Dialysis patient ([place])    on PD 7 night weekly at home   ESRD on peritoneal  dialysis [facility])    Essential hypertension    Gout    Hemorrhoid    Hypertension    Hypothyroidism    Kidney stones    Lumbar spondylolysis    Mixed hyperlipidemia    Orthostatic hypotension    Osteoarthritis    Pulmonary emboli ([place]) 08/21/2020   Syncope    Vitamin D deficiency    Social History   Socioeconomic History   Marital status: Married    Spouse name: Not on file   Number of children: 1   Years of education: HS   Highest education level: Not on file  Occupational History   Occupation: Retired   Tobacco Use   Smoking status: Former    Packs/day: 3.00    Years: 50.00    Total pack years: 150.00    Types: Cigarettes    Quit date: 12/13/2010    Years since quitting: 11.1   Smokeless tobacco: Former   Tobacco comments:    Electronic cigarette...USES INFREQUENTLY NOW  Vaping Use   Vaping Use: Former   Start date: 02/01/2013   Quit date: 06/08/2020  Substance and Sexual Activity   Alcohol use: Not Currently    Comment: Rare   Drug use: No   Sexual activity: Yes  Other Topics Concern   Not on file  Social History Narrative   Drinks about 3 cups of coffee a day, drinks about 2 sundrops a day    Social Determinants of Radio broadcast assistant Strain: Not on file  Food Insecurity: Not on file  Transportation Needs: Not on file  Physical Activity: Not on file  Stress: Not on file  Social Connections: Not on file   Family History  Problem Relation Age of Onset   Heart disease Mother    Heart disease Father    Hypertension Father    Colon cancer Neg Hx    Esophageal cancer Neg Hx    Stomach cancer Neg Hx    Scheduled Meds:  ondansetron (ZOFRAN) IV  4 mg Intravenous Q6H   pantoprazole (PROTONIX) IV  40 mg Intravenous Q24H   sodium chloride flush  3 mL Intravenous Q12H   Continuous Infusions:  sodium chloride     PRN Meds:.sodium chloride, sodium chloride flush No Known Allergies Review of Systems  Unable to perform ROS: Mental status change     Physical Exam Constitutional:      General: He is not in acute distress.    Appearance: He is ill-appearing.  Pulmonary:     Effort: Pulmonary effort is normal.  Skin:    General: Skin is warm and dry.     Vital Signs: BP 110/72   Pulse 68   Temp 98.1 F (36.7 C) (Axillary)   Resp 12   Ht '5\' 8"'$  (1.727 m)   Wt 95 kg   SpO2 96%   BMI 31.84 kg/m  Pain Scale: 0-10   Pain Score: 0-No pain   SpO2: SpO2: 96 % O2 Device:SpO2: 96 %  O2 Flow Rate: .O2 Flow Rate (L/min): 0 L/min  IO: Intake/output summary:  Intake/Output Summary (Last 24 hours) at 01/28/2022 1454 Last data filed at 01/28/2022 0127 Gross per 24 hour  Intake 200 ml  Output --  Net 200 ml    LBM: Last BM Date : 01/28/22 Baseline Weight: Weight: 95 kg Most recent weight: Weight: 95 kg      Flowsheet Rows    Flowsheet Row Most Recent Value  Intake Tab   Referral Department Hospitalist  Unit at Time of Referral ER  Palliative Care Primary Diagnosis Other (Comment)  Date Notified 01/27/22  Palliative Care Type Return patient Palliative Care  Reason for referral Clarify Goals of Care  Date of Admission 01/27/22  Date first seen by Palliative Care 01/28/22  # of days Palliative referral response time 1 Day(s)  # of days IP prior to Palliative referral 0  Clinical Assessment   Palliative Performance Scale Score 20%  Pain Max last 24 hours Not able to report  Pain Min Last 24 hours Not able to report  Dyspnea Max Last 24 Hours Not able to report  Dyspnea Min Last 24 hours Not able to report  Psychosocial & Spiritual Assessment   Palliative Care Outcomes       *Please note that this is a verbal dictation therefore any spelling or grammatical errors are due to the "[employer]" system interpretation.  Juel Burrow, DNP, AGNP-C Palliative Medicine Team [phone] Pager: [phone]

## 2022-01-29 DIAGNOSIS — I4892 Unspecified atrial flutter: Secondary | ICD-10-CM

## 2022-01-29 DIAGNOSIS — R1084 Generalized abdominal pain: Secondary | ICD-10-CM | POA: Diagnosis not present

## 2022-01-29 DIAGNOSIS — R778 Other specified abnormalities of plasma proteins: Secondary | ICD-10-CM

## 2022-01-29 DIAGNOSIS — K920 Hematemesis: Secondary | ICD-10-CM | POA: Diagnosis not present

## 2022-01-29 DIAGNOSIS — R627 Adult failure to thrive: Secondary | ICD-10-CM | POA: Diagnosis not present

## 2022-01-29 LAB — HEMOGLOBIN AND HEMATOCRIT, BLOOD
HCT: 33.1 % — ABNORMAL LOW (ref 39.0–52.0)
HCT: 35.1 % — ABNORMAL LOW (ref 39.0–52.0)
Hemoglobin: 11.9 g/dL — ABNORMAL LOW (ref 13.0–17.0)
Hemoglobin: 12.4 g/dL — ABNORMAL LOW (ref 13.0–17.0)

## 2022-01-29 LAB — TYPE AND SCREEN
ABO/RH(D): A NEG
Antibody Screen: NEGATIVE

## 2022-01-29 LAB — BODY FLUID CELL COUNT WITH DIFFERENTIAL
Eos, Fluid: 0 %
Lymphs, Fluid: 1 %
Monocyte-Macrophage-Serous Fluid: 10 % — ABNORMAL LOW (ref 50–90)
Neutrophil Count, Fluid: 89 % — ABNORMAL HIGH (ref 0–25)
Total Nucleated Cell Count, Fluid: 10458 cu mm — ABNORMAL HIGH (ref 0–1000)

## 2022-01-29 LAB — PATHOLOGIST SMEAR REVIEW

## 2022-01-29 LAB — TROPONIN I (HIGH SENSITIVITY): Troponin I (High Sensitivity): 1124 ng/L (ref <18)

## 2022-01-29 MED ORDER — VORTIOXETINE HBR 20 MG PO TABS
20.0000 mg | ORAL_TABLET | Freq: Every day | ORAL | Status: DC
  Administered 2022-01-29 – 2022-02-02 (×5): 20 mg via ORAL
  Filled 2022-01-29 (×5): qty 1

## 2022-01-29 MED ORDER — DOCUSATE SODIUM 100 MG PO CAPS: 200.0000 mg | ORAL_CAPSULE | Freq: Two times a day (BID) | ORAL | Status: DC | PRN

## 2022-01-29 MED ORDER — POTASSIUM CHLORIDE CRYS ER 20 MEQ PO TBCR
40.0000 meq | EXTENDED_RELEASE_TABLET | Freq: Once | ORAL | Status: AC
Start: 2022-01-29 — End: 2022-01-29
  Administered 2022-01-29: 40 meq via ORAL
  Filled 2022-01-29: qty 2

## 2022-01-29 MED ORDER — LEVOTHYROXINE SODIUM 75 MCG PO TABS
175.0000 ug | ORAL_TABLET | Freq: Every day | ORAL | Status: DC
  Administered 2022-01-29 – 2022-02-02 (×5): 175 ug via ORAL
  Filled 2022-01-29 (×5): qty 1

## 2022-01-29 MED ORDER — SERTRALINE HCL 50 MG PO TABS
50.0000 mg | ORAL_TABLET | Freq: Every day | ORAL | Status: DC
  Administered 2022-01-29 – 2022-02-02 (×5): 50 mg via ORAL
  Filled 2022-01-29 (×5): qty 1

## 2022-01-29 MED ORDER — VANCOMYCIN HCL 1500 MG/300ML IV SOLN
1500.0000 mg | Freq: Once | INTRAVENOUS | Status: AC
  Administered 2022-01-29: 1500 mg via INTRAVENOUS
  Filled 2022-01-29: qty 300

## 2022-01-29 MED ORDER — METOPROLOL TARTRATE 5 MG/5ML IV SOLN
5.0000 mg | Freq: Once | INTRAVENOUS | Status: AC
  Administered 2022-01-29: 5 mg via INTRAVENOUS
  Filled 2022-01-29: qty 5

## 2022-01-29 MED ORDER — GENTAMICIN SULFATE 0.1 % EX CREA
1.0000 | TOPICAL_CREAM | Freq: Every day | CUTANEOUS | Status: DC
  Administered 2022-01-30: 1 via TOPICAL
  Filled 2022-01-29: qty 15

## 2022-01-29 MED ORDER — VANCOMYCIN VARIABLE DOSE PER UNSTABLE RENAL FUNCTION (PHARMACIST DOSING): Status: DC

## 2022-01-29 MED ORDER — SODIUM CHLORIDE 0.9 % IV SOLN
1.0000 g | INTRAVENOUS | Status: DC
  Administered 2022-01-29 – 2022-02-02 (×5): 1 g via INTRAVENOUS
  Filled 2022-01-29 (×5): qty 10

## 2022-01-29 MED ORDER — SIMVASTATIN 20 MG PO TABS
40.0000 mg | ORAL_TABLET | Freq: Every evening | ORAL | Status: DC
  Administered 2022-01-29 – 2022-01-31 (×3): 40 mg via ORAL
  Filled 2022-01-29 (×4): qty 2

## 2022-01-29 MED ORDER — FINASTERIDE 5 MG PO TABS
5.0000 mg | ORAL_TABLET | Freq: Every day | ORAL | Status: DC
  Administered 2022-01-29 – 2022-02-02 (×5): 5 mg via ORAL
  Filled 2022-01-29 (×5): qty 1

## 2022-01-29 MED ORDER — POLYETHYLENE GLYCOL 3350 17 G PO PACK
17.0000 g | PACK | Freq: Every day | ORAL | Status: DC
  Administered 2022-01-29 – 2022-02-02 (×5): 17 g via ORAL
  Filled 2022-01-29 (×5): qty 1

## 2022-01-29 MED ORDER — DELFLEX-LC/1.5% DEXTROSE 344 MOSM/L IP SOLN: Freq: Every day | INTRAPERITONEAL | Status: DC

## 2022-01-29 MED ORDER — METOPROLOL TARTRATE 12.5 MG HALF TABLET
12.5000 mg | ORAL_TABLET | Freq: Two times a day (BID) | ORAL | Status: DC
  Administered 2022-01-30 – 2022-02-02 (×6): 12.5 mg via ORAL
  Filled 2022-01-29 (×8): qty 1

## 2022-01-29 NOTE — Progress Notes (Signed)
Pharmacy Antibiotic Note  [name] is a 76 y.o. male admitted on 01/27/2022 with  peritonitis .  Pharmacy has been consulted for vancomycin dosing.  Plan: Vancomycin 1500 mg iv X1 dose and then by levels  Height: '5\' 8"'$  (172.7 cm) Weight: 95 kg (209 lb 7 oz) IBW/kg (Calculated) : 68.4  Temp (24hrs), Avg:98.1 F (36.7 C), Min:97.9 F (36.6 C), Max:98.2 F (36.8 C)  Recent Labs  Lab 01/27/22 1957 01/28/22 0443  WBC 11.4*  --   CREATININE 5.40* 5.49*    Estimated Creatinine Clearance: 12.8 mL/min (A) (by C-G formula based on SCr of 5.49 mg/dL (H)).    No Known Allergies  Antimicrobials this admission: cefepime 8/3 >>   Vanc 8/3 >>    Microbiology results: 8/2 Peritoneal Dialysate: WBC present, both PMN, mononuclear, GPC in pairs in singles   Thank you for allowing pharmacy to be a part of this patient's care.  Vaughan Basta BS, PharmD, BCPS Clinical Pharmacist 01/29/2022 8:54 AM  Contact: [phone] after 3 PM  "Be curious, not judgmental..." -Jamal Maes

## 2022-01-29 NOTE — Progress Notes (Addendum)
PROGRESS NOTE    [name]  VPX:[id number] DOB: 1946-03-11 DOA: 01/27/2022 PCP: Celene Squibb, MD   Brief Narrative:  76 y/o male with history of ESRD on PD, history of CVA, chronic dysphagia, presented with nausea, vomiting and 2 episodes of hematemesis. Nephrology and GI consulted. Due to multiple medical issues, palliative care also consulted for goals of care.  Assessment & Plan:   Principal Problem:   GI bleed Active Problems:   Dysphagia   Intractable nausea and vomiting   ESRD (end stage renal disease) on dialysis (HCC)   Failure to thrive in adult   History of CVA (cerebrovascular accident)   Hypokalemia   Hypothyroidism, unspecified   Pressure injury of skin   H/O aortic valve replacement with porcine valve   Acute peritonitis ([place])   DNR (do not resuscitate)   Functional quadriplegia (HCC)   GI bleeding  Upper GI bleed/hematemesis/acute blood loss anemia: Patient presents to the ER for evaluation of 2 episodes of hematemesis.  Patient transferred to [facility] for GI evaluation.  He was started on Protonix drip, aspirin was placed on hold.  Hemoglobin dropped from 14.1-11.9 today.  No further hematemesis episodes during this hospitalization.  Seen by GI, no plans for EGD, they are watching with conservative management.  They switched him from Protonix drip to daily IV Protonix.   Intractable nausea and vomiting/acute peritonitis in the setting of peritoneal dialysis: His symptoms were likely secondary to peritonitis.  He is feeling better today.  Continue symptomatic treatment.  Peritoneal fluid analysis suggestive of peritonitis.  Nephrology managing this and they have started him on gentamicin and vancomycin.  Patient still with abdominal pain.   ESRD (end stage renal disease) on peritoneal dialysis [facility]): Nephrology managing.   Failure to thrive in adult Patient with a history of adult failure to thrive secondary to multiple medical problems.  Palliative on  board.   History of CVA (cerebrovascular accident) Oropharyngeal dysphagia Patient has a history of CVA and is currently bedbound and requires assistance with all activities of daily living.  Has some baseline dysarthria as well. Seen by palliative care during his last hospitalization Patient will need frequent turning since he has a stage II decubitus ulcer involving the right buttock to prevent development of new pressure injuries related to his bedbound state   Hypokalemia: Low again.  Will replace.   Acquired hypothyroidism: Resume Synthroid.  Hyperlipidemia: Resume Zocor.  Paroxysmal Atrial Flutter - This was a new diagnosis for him during his prior admission in May 2023.  And noted on repeat EKG during his last office visit. Recent Zio patch showed his predominant rhythm was atrial flutter with variable conduction and average heart rate in the 50's and no episodes of RVR.  He remains off AV nodal blocking agents for now given his baseline bradycardia and denies any recent palpitations.  - His CHA2DS2-VASc Score is equal to 9.7 % stroke rate/year from a score of 6(HTN, Vascular, Age (2), PE (2)). By review of notes, he was not felt to be an anticoagulation candidate given his prior GI bleeding while on anticoagulation and also not an optimal candidate for Watchman device placement given his co morbidities. He is now having frequent falls and hitting his head which also makes anticoagulation not ideal.    Aortic Stenosis: He is s/p AVR in 2014 with Edwards pericardial valve.  Most recent echocardiogram in 08/2021 showed elevated gradients across the valve with AVA at 0.7 cm. Will continue to follow  with cardiology as outpatient.   Mitral stenosis - Echocardiogram in 08/2021 showed moderate MS and moderate TR. Will continue to follow with cardiology as outpatient.   Pressure injury of skin Patient noted to have stage II decubitus ulcer involving the right buttock, POA Wound care  consult  Addendum: Earlier I was informed by the nurse that the monitor was showing possible STEMI.  Patient had no chest pain or shortness of breath.  EKG was obtained which showed atrial flutter.  No ST elevation.  Obtained troponins which are around 1100.  Consulted cardiology and spoke to Dr. Phineas Inches.  They will formally consult.  DVT prophylaxis: SCDs Start: 01/27/22 2140   Code Status: DNR  Family Communication:  None present at bedside.  Plan of care discussed with patient in length and he/she verbalized understanding and agreed with it.  Status is: Inpatient Remains inpatient appropriate because: Needs IV antibiotics for peritonitis, still symptomatic.   Estimated body mass index is 31.84 kg/m as calculated from the following:   Height as of this encounter: '5\' 8"'$  (1.727 m).   Weight as of this encounter: 95 kg.  Pressure Injury 12/14/21 Buttocks Right Stage 2 -  Partial thickness loss of dermis presenting as a shallow open injury with a red, pink wound bed without slough. (Active)  12/14/21 0937  Location: Buttocks  Location Orientation: Right  Staging: Stage 2 -  Partial thickness loss of dermis presenting as a shallow open injury with a red, pink wound bed without slough.  Wound Description (Comments):   Present on Admission: Yes   Nutritional Assessment: Body mass index is 31.84 kg/m.Marland Kitchen Seen by dietician.  I agree with the assessment and plan as outlined below: Nutrition Status:        . Skin Assessment: I have examined the patient's skin and I agree with the wound assessment as performed by the wound care RN as outlined below: Pressure Injury 12/14/21 Buttocks Right Stage 2 -  Partial thickness loss of dermis presenting as a shallow open injury with a red, pink wound bed without slough. (Active)  12/14/21 0937  Location: Buttocks  Location Orientation: Right  Staging: Stage 2 -  Partial thickness loss of dermis presenting as a shallow open injury with a red,  pink wound bed without slough.  Wound Description (Comments):   Present on Admission: Yes    Consultants:  Nephrology and GI  Procedures:  None  Antimicrobials:  Anti-infectives (From admission, onward)    Start     Dose/Rate Route Frequency Ordered Stop   01/29/22 1000  ceFEPIme (MAXIPIME) 1 g in sodium chloride 0.9 % 100 mL IVPB        1 g 200 mL/hr over 30 Minutes Intravenous Every 24 hours 01/29/22 0829     01/29/22 0945  vancomycin (VANCOREADY) IVPB 1500 mg/300 mL        1,500 mg 150 mL/hr over 120 Minutes Intravenous  Once 01/29/22 0851     01/29/22 0900  vancomycin variable dose per unstable renal function (pharmacist dosing)         Does not apply See admin instructions 01/29/22 0900           Subjective: Seen and examined.  Complains of abdominal pain but no more nausea or vomiting.  Pain is improving.  No other complaint.  Objective: Vitals:   01/28/22 1643 01/28/22 2000 01/29/22 0147 01/29/22 0400  BP: 1'12/77 95/67 94/84 '$ 108/64  Pulse: 75 70 68 77  Resp: '13 15 17 '$ 19  Temp: 98.1 F (36.7 C) 98.2 F (36.8 C) 98.1 F (36.7 C) 97.9 F (36.6 C)  TempSrc: Oral Oral Oral Oral  SpO2: 95% 95% 95% 92%  Weight:      Height:        Intake/Output Summary (Last 24 hours) at 01/29/2022 1010 Last data filed at 01/29/2022 0600 Gross per 24 hour  Intake --  Output 750 ml  Net -750 ml   Filed Weights   01/27/22 1910  Weight: 95 kg    Examination:  General exam: Appears calm and comfortable  Respiratory system: Clear to auscultation. Respiratory effort normal. Cardiovascular system: S1 & S2 heard, RRR. No JVD, murmurs, rubs, gallops or clicks. No pedal edema. Gastrointestinal system: Abdomen is nondistended, soft and mild to moderate generalized tenderness, no organomegaly or masses felt. Normal bowel sounds heard. Central nervous system: Alert and oriented. No focal neurological deficits.  Has some dysarthria residual from previous stroke. Extremities: Symmetric  5 x 5 power. Skin: No rashes, lesions or ulcers   Data Reviewed: I have personally reviewed following labs and imaging studies  CBC: Recent Labs  Lab 01/27/22 1957 01/27/22 2352 01/28/22 0747 01/28/22 1610 01/29/22 0446  WBC 11.4*  --   --   --   --   NEUTROABS 9.5*  --   --   --   --   HGB 14.1 13.4 12.7* 12.9* 11.9*  HCT 39.8 38.1* 35.7* 36.9* 33.1*  MCV 94.8  --   --   --   --   PLT 292  --   --   --   --    Basic Metabolic Panel: Recent Labs  Lab 01/27/22 1957 01/28/22 0443  NA 132* 132*  K 3.1* 3.2*  CL 94* 96*  CO2 25 25  GLUCOSE 161* 107*  BUN 33* 36*  CREATININE 5.40* 5.49*  CALCIUM 9.3 9.0   GFR: Estimated Creatinine Clearance: 12.8 mL/min (A) (by C-G formula based on SCr of 5.49 mg/dL (H)). Liver Function Tests: Recent Labs  Lab 01/27/22 1957  AST 22  ALT 15  ALKPHOS 92  BILITOT 0.5  PROT 6.9  ALBUMIN 2.5*   No results for input(s): "LIPASE", "AMYLASE" in the last 168 hours. No results for input(s): "AMMONIA" in the last 168 hours. Coagulation Profile: No results for input(s): "INR", "PROTIME" in the last 168 hours. Cardiac Enzymes: No results for input(s): "CKTOTAL", "CKMB", "CKMBINDEX", "TROPONINI" in the last 168 hours. BNP (last 3 results) No results for input(s): "PROBNP" in the last 8760 hours. HbA1C: No results for input(s): "HGBA1C" in the last 72 hours. CBG: Recent Labs  Lab 01/28/22 0953  GLUCAP 100*   Lipid Profile: No results for input(s): "CHOL", "HDL", "LDLCALC", "TRIG", "CHOLHDL", "LDLDIRECT" in the last 72 hours. Thyroid Function Tests: No results for input(s): "TSH", "T4TOTAL", "FREET4", "T3FREE", "THYROIDAB" in the last 72 hours. Anemia Panel: No results for input(s): "VITAMINB12", "FOLATE", "FERRITIN", "TIBC", "IRON", "RETICCTPCT" in the last 72 hours. Sepsis Labs: No results for input(s): "PROCALCITON", "LATICACIDVEN" in the last 168 hours.  Recent Results (from the past 240 hour(s))  Body fluid culture w Gram  Stain     Status: None (Preliminary result)   Collection Time: 01/28/22  3:36 PM   Specimen: Peritoneal Dialysate; Body Fluid  Result Value Ref Range Status   Specimen Description PERITONEAL DIALYSATE  Final   Special Requests Normal  Final   Gram Stain   Final    CYTOSPIN SMEAR WBC PRESENT,BOTH PMN AND MONONUCLEAR GRAM POSITIVE COCCI  IN PAIRS IN SINGLES    Culture   Final    NO GROWTH < 12 HOURS Performed at [facility], [place] [address] [place], [place] [address]    Report Status PENDING  Incomplete     Radiology Studies: CT ABDOMEN PELVIS WO CONTRAST  Result Date: 01/27/2022 CLINICAL DATA:  Abdominal pain and hematemesis, initial encounter EXAM: CT ABDOMEN AND PELVIS WITHOUT CONTRAST TECHNIQUE: Multidetector CT imaging of the abdomen and pelvis was performed following the standard protocol without IV contrast. RADIATION DOSE REDUCTION: This exam was performed according to the departmental dose-optimization program which includes automated exposure control, adjustment of the mA and/or kV according to patient size and/or use of iterative reconstruction technique. COMPARISON:  01/03/2022 FINDINGS: Lower chest: Emphysematous changes are noted. Chronic scarring is noted in the bases particularly on the left stable from the prior exam. Hepatobiliary: Liver demonstrates minimal nodularity. No focal mass is seen. The gallbladder is within normal limits. Pancreas: Unremarkable. No pancreatic ductal dilatation or surrounding inflammatory changes. Spleen: Normal in size without focal abnormality. Adrenals/Urinary Tract: Adrenal glands are within normal limits. The kidneys are well visualize without renal calculi or urinary tract obstructive changes. Vascular calcifications are seen. The bladder is partially distended. Stomach/Bowel: No obstructive or inflammatory changes of the colon are seen. The appendix is well visualized and within normal limits. No inflammatory changes to suggest appendicitis  are noted. Small bowel and stomach are unremarkable. Vascular/Lymphatic: Aortic atherosclerosis. No enlarged abdominal or pelvic lymph nodes. Reproductive: Prostate is unremarkable. Other: Considerable free fluid is noted throughout the abdomen consistent with the known history of peritoneal dialysis. The peritoneal dialysis catheter is noted deep in the pelvis. No focal herniation is noted. Musculoskeletal: Degenerative changes of lumbar spine are noted. IMPRESSION: Peritoneal dialysis catheter with evidence of dialysate throughout the abdomen. No acute abnormality is identified correspond with the given clinical history. Electronically Signed   By: Inez Catalina M.D.   On: 01/27/2022 20:35    Scheduled Meds:  gentamicin cream  1 Application Topical Daily   ondansetron (ZOFRAN) IV  4 mg Intravenous Q6H   pantoprazole (PROTONIX) IV  40 mg Intravenous Q24H   sodium chloride flush  3 mL Intravenous Q12H   sucralfate  1 g Oral BID   vancomycin variable dose per unstable renal function (pharmacist dosing)   Does not apply See admin instructions   Continuous Infusions:  sodium chloride     ceFEPime (MAXIPIME) IV     dialysis solution 1.5% low-MG/low-CA     vancomycin       LOS: 1 day   Darliss Cheney, MD [facility]  01/29/2022, 10:10 AM   *Please note that this is a verbal dictation therefore any spelling or grammatical errors are due to the "[place] [employer]" system interpretation.  Please page via [place] and do not message via secure chat for urgent patient care matters. Secure chat can be used for non urgent patient care matters.  How to contact the [facility] Attending or Consulting provider [place] or covering provider during after hours [place], for this patient?  Check the care team in [facility] and look for a) attending/consulting [facility] provider listed and b) the [facility] team listed. Page or secure chat [phone]. Log into [email or web address] and use [place]'s universal password to access. If you do not  have the password, please contact the hospital operator. Locate the [facility] provider you are looking for under [facility] and page to a number that you can  be directly reached. If you still have difficulty reaching the provider, please page the [facility] (Director on Call) for the Hospitalists listed on amion for assistance.

## 2022-01-29 NOTE — Consult Note (Signed)
Cardiology Consultation:   Patient ID: [name] MRN: [id number]; DOB: 12-29-1945  Admit date: 01/27/2022 Date of Consult: 01/29/2022  PCP:  Celene Squibb, MD   [facility] Providers Cardiologist:  Rozann Lesches, MD        Patient Profile:   [name] is a 76 y.o. male with a hx of CAD s/p SVG to OM 2014, HTN, ESRD on PD, hx of CVA, bedbound, chronic dysphagia, s/p AVR with bioprosthetic AVR, hx of atrial flutter chads2vasc=6, hx of PE, hypothyroid who is being seen 01/29/2022 for the evaluation of elevated troponin at the request of Dr. Doristine Bosworth for possible ischemic eval  History of Present Illness:   [name] was brought by EMS for hematemesis. Hx was provided by his wife on arrival. Per his wife on admission he was taking in poor PO and had N/V and abdominal pain. This is an on going issue with intractable n/v and dysphagia and palliative was consulted. He is DNR. He is FTT. He was given IV protonix. Hgb 12. No plans per GI.  He sees Dr. Domenic Polite. EF is normal with grade II DD. Severe pulmonary hypertension RVSP 63. Mild to moderate MS, gradient 11 mHg, MVA 2.3 (normal). Significant MAC. Bioprosthesis gradients elevated 28 mmHg. DI 0.35. EOAi 0.87. AT is steep, not c/f significant stenosis or PPM. He has hx of GI bleeding and he is not a candidate for longterm AC. Further he has frequent falls and hits his head making him not an AC candidate.Not a Watchman candidate with comorbidities. He has ziopatch as well with slow flutter, AVN blocking was held. He has hx of CABG s/p  SVG to OM in 2014. Maintained on aspirin and statin.   Cardiology was consulted for an elevated troponin checked after an episode of flutter with variable block and rates up to 130s. Troponin w/ concern for dynamic EKG changes and it was 1124. Patient cannot give a hx. He is comfortable.   Past Medical History:  Diagnosis Date   Aortic stenosis    Status post AVR, 25 mm Edwards pericardial Magna-Ease valve  2014   Arthritis    CAD (coronary artery disease)    Status post SVG to OM 2014   COPD (chronic obstructive pulmonary disease) ([place])    Depression    Dialysis patient ([place])    on PD 7 night weekly at home   ESRD on peritoneal dialysis [facility])    Essential hypertension    Gout    Hemorrhoid    Hypertension    Hypothyroidism    Kidney stones    Lumbar spondylolysis    Mixed hyperlipidemia    Orthostatic hypotension    Osteoarthritis    Pulmonary emboli ([place]) 08/21/2020   Syncope    Vitamin D deficiency     Past Surgical History:  Procedure Laterality Date   ANGIOPASTY     AORTIC VALVE REPLACEMENT  07/11/2012   Procedure: AORTIC VALVE REPLACEMENT (AVR);  Surgeon: Gaye Pollack, MD;  Location: [place];  Service: Open Heart Surgery;  Laterality: N/A;   AV FISTULA PLACEMENT Left 05/30/2020   Procedure: ARTERIOVENOUS (AV) FISTULA CREATION LEFT;  Surgeon: [name], MD;  Location: [facility];  Service: Vascular;  Laterality: Left;   CARDIAC CATHETERIZATION     CAROTID ENDARTERECTOMY Left    CATARACT EXTRACTION W/PHACO Right 06/09/2021   Procedure: CATARACT EXTRACTION RIGHT EYE W/ PHACO AND INTRAOCULAR LENS PLACEMENT ([place]);  Surgeon: Baruch Goldmann, MD;  Location: [facility];  Service: Ophthalmology;  Laterality: Right;  CDE: 12.97   CATARACT EXTRACTION W/PHACO Left 06/27/2021   Procedure: CATARACT EXTRACTION PHACO AND INTRAOCULAR LENS PLACEMENT ([place]) WITH IACCESS GONIOTOMY;  Surgeon: Baruch Goldmann, MD;  Location: [facility];  Service: Ophthalmology;  Laterality: Left;  CDE: 10.08   CIRCUMCISION     COLONOSCOPY WITH PROPOFOL N/A 07/16/2020   Procedure: COLONOSCOPY WITH PROPOFOL;  Surgeon: Doran Stabler, MD;  Location: [place];  Service: Gastroenterology;  Laterality: N/A;   CORONARY ARTERY BYPASS GRAFT  07/11/2012   Procedure: CORONARY ARTERY BYPASS GRAFTING (CABG);  Surgeon: Gaye Pollack, MD;  Location: [place];  Service: Open Heart Surgery;  Laterality: N/A;  CABG x one,  using  right leg greater saphenous vein harvested endoscopically   ESOPHAGOGASTRODUODENOSCOPY (EGD) WITH PROPOFOL N/A 07/15/2020   Procedure: ESOPHAGOGASTRODUODENOSCOPY (EGD) WITH PROPOFOL;  Surgeon: Doran Stabler, MD;  Location: [place];  Service: Gastroenterology;  Laterality: N/A;   HOT HEMOSTASIS N/A 07/16/2020   Procedure: HOT HEMOSTASIS (ARGON PLASMA COAGULATION/BICAP);  Surgeon: Doran Stabler, MD;  Location: [place];  Service: Gastroenterology;  Laterality: N/A;   INSERTION OF ANTERIOR SEGMENT AQUEOUS DRAINAGE DEVICE ([place]) Right 06/09/2021   Procedure: INSERTION OF RIGHT EYE ANTERIOR SEGMENT AQUEOUS DRAINAGE DEVICE ([place]);  Surgeon: Baruch Goldmann, MD;  [facility];  Service: Ophthalmology;  Laterality: Right;  CDE: 12.97   INTRAOPERATIVE TRANSESOPHAGEAL ECHOCARDIOGRAM  07/11/2012   Procedure: INTRAOPERATIVE TRANSESOPHAGEAL ECHOCARDIOGRAM;  Surgeon: Gaye Pollack, MD;  Location: [place] [facility];  Service: Open Heart Surgery;  Laterality: N/A;   IR PERC TUN PERIT CATH WO PORT S&I /IMAG  06/13/2020   IR REMOVAL TUN CV CATH W/O FL  07/26/2020   IR US GUIDE VASC ACCESS RIGHT  06/13/2020   JOINT REPLACEMENT Bilateral    knees   TEE WITHOUT CARDIOVERSION N/A 06/12/2020   Procedure: TRANSESOPHAGEAL ECHOCARDIOGRAM (TEE);  Surgeon: Skeet Latch, MD;  Location: [place];  Service: Cardiovascular;  Laterality: N/A;     Home Medications:  Prior to Admission medications   Medication Sig Start Date End Date Taking? Authorizing Provider  aspirin EC 81 MG tablet Take 81 mg by mouth daily. Swallow whole.   Yes [provider]  B Complex-C-Folic Acid (RENO CAPS) 1 MG CAPS Take by mouth.   Yes [provider]  collagenase (SANTYL) 250 UNIT/GM ointment Apply 1 application. topically daily as needed (sore).   Yes [provider]  cyanocobalamin (,VITAMIN B-12,) 1000 MCG/ML injection Inject 1,000 mcg into the muscle every 30 (thirty) days.   Yes [provider]  docusate sodium (COLACE) 100 MG capsule Take 200 mg by mouth 2 (two) times daily as needed for mild constipation.   Yes [provider]  finasteride (PROSCAR) 5 MG tablet Take 5 mg by mouth daily.   Yes [provider]  levothyroxine (SYNTHROID) 175 MCG tablet Take 175 mcg by mouth daily before breakfast.  05/12/12  Yes [provider]  ondansetron (ZOFRAN-ODT) 4 MG disintegrating tablet Take 4 mg by mouth every 8 (eight) hours as needed. 01/26/22  Yes [provider]  ondansetron (ZOFRAN-ODT) 8 MG disintegrating tablet Take 1 tablet (8 mg total) by mouth every 8 (eight) hours as needed for nausea or vomiting. 01/04/22  Yes Bonnielee Haff, MD  pantoprazole (PROTONIX) 40 MG tablet Take 40 mg by mouth daily.   Yes [provider]  polyethylene glycol (MIRALAX / GLYCOLAX) 17 g packet Take 17 g by mouth daily. Patient taking differently: Take  17 g by mouth daily as needed for mild constipation or moderate constipation. 06/14/20  Yes Regalado, Belkys A, MD  potassium chloride (KLOR-CON) 10 MEQ tablet Take 10 mEq by mouth 2 (two) times daily. 12/08/21  Yes [provider]  sertraline (ZOLOFT) 50 MG tablet Take 50 mg by mouth daily.   Yes [provider]  simvastatin (ZOCOR) 40 MG tablet Take 1 tablet (40 mg total) by mouth every evening. 11/07/21  Yes Strader, [place] M, PA-C  TRINTELLIX 20 MG TABS tablet Take 20 mg by mouth daily. 03/19/21  Yes [provider]    Inpatient Medications: Scheduled Meds:  finasteride  5 mg Oral Daily   gentamicin cream  1 Application Topical Daily   gentamicin cream  1 Application Topical Daily   levothyroxine  175 mcg Oral Q0600   ondansetron (ZOFRAN) IV  4 mg Intravenous Q6H   pantoprazole (PROTONIX) IV  40 mg Intravenous Q24H   polyethylene glycol  17 g Oral Daily   sertraline  50 mg Oral Daily   simvastatin  40 mg Oral QPM   sodium chloride flush  3 mL Intravenous Q12H    sucralfate  1 g Oral BID   vancomycin variable dose per unstable renal function (pharmacist dosing)   Does not apply See admin instructions   vortioxetine HBr  20 mg Oral Daily   Continuous Infusions:  sodium chloride     ceFEPime (MAXIPIME) IV 1 g (01/29/22 1040)   dialysis solution 1.5% low-MG/low-CA     dialysis solution 1.5% low-MG/low-CA     PRN Meds: sodium chloride, docusate sodium, sodium chloride flush  Allergies:   No Known Allergies  Social History:   Social History   Socioeconomic History   Marital status: Married    Spouse name: Not on file   Number of children: 1   Years of education: HS   Highest education level: Not on file  Occupational History   Occupation: Retired   Tobacco Use   Smoking status: Former    Packs/day: 3.00    Years: 50.00    Total pack years: 150.00    Types: Cigarettes    Quit date: 12/13/2010    Years since quitting: 11.1   Smokeless tobacco: Former   Tobacco comments:    Electronic cigarette...USES INFREQUENTLY NOW  Vaping Use   Vaping Use: Former   Start date: 02/01/2013   Quit date: 06/08/2020  Substance and Sexual Activity   Alcohol use: Not Currently    Comment: Rare   Drug use: No   Sexual activity: Yes  Other Topics Concern   Not on file  Social History Narrative   Drinks about 3 cups of coffee a day, drinks about 2 sundrops a day    Social Determinants of Radio broadcast assistant Strain: Not on file  Food Insecurity: Not on file  Transportation Needs: Not on file  Physical Activity: Not on file  Stress: Not on file  Social Connections: Not on file  Intimate Partner Violence: Not on file    Family History:    Family History  Problem Relation Age of Onset   Heart disease Mother    Heart disease Father    Hypertension Father    Colon cancer Neg Hx    Esophageal cancer Neg Hx    Stomach cancer Neg Hx      ROS:  Please see the history of present illness.   All other ROS reviewed and negative.  Physical Exam/Data:   Vitals:   01/29/22 1200 01/29/22 1215 01/29/22 1300 01/29/22 1515  BP: 99/67 104/67 119/65 121/60  Pulse: 85 87 90 85  Resp: _0 Temp:  97.8 F (36.6 C)  98.5 F (36.9 C)  TempSrc:  Oral  Oral  SpO2: 94% 95% 94% 93%  Weight:      Height:        Intake/Output Summary (Last 24 hours) at 01/29/2022 1607 Last data filed at 01/29/2022 1557 Gross per 24 hour  Intake 120 ml  Output 750 ml  Net -630 ml      01/27/2022    7:10 PM 01/02/2022   12:30 AM 01/01/2022   10:20 AM  Last 3 Weights  Weight (lbs) 209 lb 7 oz 209 lb 7 oz 224 lb 13.9 oz  Weight (kg) 95 kg 95 kg 102 kg     Body mass index is 31.84 kg/m.  General:  Well nourished, well developed, in no acute distress. Alert and awake HEENT: normal Neck: no JVD Vascular: No carotid bruits; Distal pulses 2+ bilaterally Cardiac:  normal S1, S2; irregular ; no murmur  Lungs:  clear to auscultation bilaterally, no wheezing, rhonchi or rales  Abd: soft, nontender, no hepatomegaly  Ext: no edema Musculoskeletal:  No deformities, BUE and BLE strength normal and equal Skin: warm and dry  Neuro:  CNs 2-12 intact, no focal abnormalities noted Psych:  Normal affect   EKG:  The EKG was personally reviewed and demonstrates:  Flutter with variable block, LBBB morphology, LVH with repol, unchanged from prior  Telemetry:  Telemetry was personally reviewed and demonstrates:   Flutter with variable block rate controlled  Relevant CV Studies: TTE 06/09/2020 1. Left ventricular ejection fraction, by estimation, is 60 to 65%. The  left ventricle has normal function. The left ventricle has no regional  wall motion abnormalities. There is moderate left ventricular hypertrophy.  Left ventricular diastolic  parameters are consistent with Grade II diastolic dysfunction  (pseudonormalization).   2. Right ventricular systolic function is mildly reduced. The right  ventricular size is mildly enlarged. There is severely  elevated pulmonary  artery systolic pressure. The estimated right ventricular systolic  pressure is 02.7 mmHg.   3. Left atrial size was mild to moderately dilated.   4. Right atrial size was mildly dilated.   5. The mitral valve is degenerative. Mild to moderate mitral valve  regurgitation. Mild to moderate mitral stenosis, mean gradient 11 mmHg but  MVA 2.3 cm^2 by PHT. Suspect elevated gradient related in part to high  flow. Moderate to severe mitral annular  calcification.   6. Bioprosthetic aortic valve. Mean gradient 28 mmHg, moderately  elevated. No significant regurgitation.   7. Tricuspid valve regurgitation is moderate.   8. The inferior vena cava is dilated in size with <50% respiratory  variability, suggesting right atrial pressure of 15 mmHg.   9. Neither the bioprosthetic aortic valve nor the heavily calcified  mitral valve/annulus were visualized well enough to rule out endocarditis.  Suggest TEE.   Laboratory Data:  High Sensitivity Troponin:   Recent Labs  Lab 01/29/22 1415  TROPONINIHS 1,124*     Chemistry Recent Labs  Lab 01/27/22 1957 01/28/22 0443  NA 132* 132*  K 3.1* 3.2*  CL 94* 96*  CO2 25 25  GLUCOSE 161* 107*  BUN 33* 36*  CREATININE 5.40* 5.49*  CALCIUM 9.3 9.0  GFRNONAA 10* 10*  ANIONGAP 13 11    Recent Labs  Lab 01/27/22 1957  PROT 6.9  ALBUMIN 2.5*  AST 22  ALT 15  ALKPHOS 92  BILITOT 0.5   Lipids No results for input(s): "CHOL", "TRIG", "HDL", "LABVLDL", "LDLCALC", "CHOLHDL" in the last 168 hours.  Hematology Recent Labs  Lab 01/27/22 1957 01/27/22 2352 01/28/22 1610 01/29/22 0446 01/29/22 1415  WBC 11.4*  --   --   --   --   RBC 4.20*  --   --   --   --   HGB 14.1   < > 12.9* 11.9* 12.4*  HCT 39.8   < > 36.9* 33.1* 35.1*  MCV 94.8  --   --   --   --   MCH 33.6  --   --   --   --   MCHC 35.4  --   --   --   --   RDW 13.0  --   --   --   --   PLT 292  --   --   --   --    < > = values in this interval not displayed.    Thyroid No results for input(s): "TSH", "FREET4" in the last 168 hours.  BNPNo results for input(s): "BNP", "PROBNP" in the last 168 hours.  DDimer No results for input(s): "DDIMER" in the last 168 hours.   Radiology/Studies:  CT ABDOMEN PELVIS WO CONTRAST  Result Date: 01/27/2022 CLINICAL DATA:  Abdominal pain and hematemesis, initial encounter EXAM: CT ABDOMEN AND PELVIS WITHOUT CONTRAST TECHNIQUE: Multidetector CT imaging of the abdomen and pelvis was performed following the standard protocol without IV contrast. RADIATION DOSE REDUCTION: This exam was performed according to the departmental dose-optimization program which includes automated exposure control, adjustment of the mA and/or kV according to patient size and/or use of iterative reconstruction technique. COMPARISON:  01/03/2022 FINDINGS: Lower chest: Emphysematous changes are noted. Chronic scarring is noted in the bases particularly on the left stable from the prior exam. Hepatobiliary: Liver demonstrates minimal nodularity. No focal mass is seen. The gallbladder is within normal limits. Pancreas: Unremarkable. No pancreatic ductal dilatation or surrounding inflammatory changes. Spleen: Normal in size without focal abnormality. Adrenals/Urinary Tract: Adrenal glands are within normal limits. The kidneys are well visualize without renal calculi or urinary tract obstructive changes. Vascular calcifications are seen. The bladder is partially distended. Stomach/Bowel: No obstructive or inflammatory changes of the colon are seen. The appendix is well visualized and within normal limits. No inflammatory changes to suggest appendicitis are noted. Small bowel and stomach are unremarkable. Vascular/Lymphatic: Aortic atherosclerosis. No enlarged abdominal or pelvic lymph nodes. Reproductive: Prostate is unremarkable. Other: Considerable free fluid is noted throughout the abdomen consistent with the known history of peritoneal dialysis. The peritoneal  dialysis catheter is noted deep in the pelvis. No focal herniation is noted. Musculoskeletal: Degenerative changes of lumbar spine are noted. IMPRESSION: Peritoneal dialysis catheter with evidence of dialysate throughout the abdomen. No acute abnormality is identified correspond with the given clinical history. Electronically Signed   By: Inez Catalina M.D.   On: 01/27/2022 20:35     Assessment and Plan:   Elevated troponin: C/f demand ischemia with variable flutter with rvr rates up to 130s. He is not having chest pressure. No significant ST changes. He is ESRD and troponin can be exaggerated. Further, he is bedbound, and FTT; he is not a cath candidate even if this were an NSTEMI. He's comfortable and not in distress, not c/f high risk nstemi, not a stemi. Atrial Flutter:  Not an AC candidate. Continue IV metop as needed. Had slower rates prior , will add low dose metop for now Moderate MS: stable CAD: s/p SVG to OM 2014: continue ASA and statin  Overall agree with palliative care. No further ischemic evaluation is planned from a cardiology perspective.   Risk Assessment/Risk Scores:       For questions or updates, please contact [place] Please consult [email or web address] for contact info under    Signed, Janina Mayo, MD  01/29/2022 4:07 PM

## 2022-01-29 NOTE — Progress Notes (Signed)
[facility] Progress Note   76 y.o. male HTN, CVA with residual dysarthria + dysphagia, bedbound on pureed diet, AVR, a flutter, CASHD, h/o PE, hypothyroidism, ESRD on PD. His spouse is his caregiver and she states that she had filled the abdomen with 3L 1.5% and then had to stop because of hematemesis x2 with bright red blood. He has had anorexia with nausea and vomiting with associated periumbilical pain in the past few days. She has also noted gargling recently and thought that was from the oropharyngeal dysphagia. He has been treated with Zofran for nausea in the past.   Dialysis Orders:  [facility] [place]  5 cycles over 11 hours 1.5%. 3L fill. Last fill icodextran 2.5L.  Outpatient PD RN Malachy Mood [phone]   Assessment/ Plan:   Hematemesis - Per primary.  ESRD -  Continue CCPD. No signs of peritonitis but will send fluid for analysis; per spouse it is cloudy initially 1st couple drains and then clears up (fibrin?). It's been studied and they haven't been told he had peritonitis -> appears to have peritonitis w/ cell count of 10K with 89% PMN's.  - Started on Cefepime (8/3/ is D1) + Vanco (pharmacy to dose)  - Cont 1.5% bags for now; we don't have Icodextran here; therefore, no day dwell here. Unable to complete treatment last night d/t hematemesis x2 (only had 1 fill). Net UF neg 255.  Hypertension/volume  - Blood pressure ok. No gross volume on exam.  Anemia  - Hgb >12 No ESA needs  Metabolic bone disease -  Corr Ca slightly elevated; will check phos. No binders for now. Nutrition - Dysphagia diet usually. H/o CVA with dysarthria, some left upper arm weakness, cognitive challenges as well. FTT/Weight loss - Significant weight loss and progressive weakness noted. Palliative care has been consulted. Guarded prognosis. DNR.   Subjective:   Still has some abd discomfort; denies nausea or shortness of breath   Objective:   BP 98/68   Pulse (!) 132   Temp 98.1 F  (36.7 C) (Oral)   Resp 17   Ht '5\' 8"'$  (1.727 m)   Wt 95 kg   SpO2 95%   BMI 31.84 kg/m   Intake/Output Summary (Last 24 hours) at 01/29/2022 1135 Last data filed at 01/29/2022 0600 Gross per 24 hour  Intake --  Output 750 ml  Net -750 ml   Weight change:   Physical Exam: General: Alert, oriented to self  Heart: RRR Lungs: Clear bilaterally  Abdomen: soft, non-tender, PD catheter on right side, no erythema  Extremities: No LE edema  Back: no flank tenderness Neuro: tremors and some weakness left arm, dysarthria GU: condom catheter in place Dialysis Access: PD catheter in place + left Cimino (+bruit)  Imaging: CT ABDOMEN PELVIS WO CONTRAST  Result Date: 01/27/2022 CLINICAL DATA:  Abdominal pain and hematemesis, initial encounter EXAM: CT ABDOMEN AND PELVIS WITHOUT CONTRAST TECHNIQUE: Multidetector CT imaging of the abdomen and pelvis was performed following the standard protocol without IV contrast. RADIATION DOSE REDUCTION: This exam was performed according to the departmental dose-optimization program which includes automated exposure control, adjustment of the mA and/or kV according to patient size and/or use of iterative reconstruction technique. COMPARISON:  01/03/2022 FINDINGS: Lower chest: Emphysematous changes are noted. Chronic scarring is noted in the bases particularly on the left stable from the prior exam. Hepatobiliary: Liver demonstrates minimal nodularity. No focal mass is seen. The gallbladder is within normal limits. Pancreas: Unremarkable. No pancreatic ductal dilatation or surrounding inflammatory  changes. Spleen: Normal in size without focal abnormality. Adrenals/Urinary Tract: Adrenal glands are within normal limits. The kidneys are well visualize without renal calculi or urinary tract obstructive changes. Vascular calcifications are seen. The bladder is partially distended. Stomach/Bowel: No obstructive or inflammatory changes of the colon are seen. The appendix is well  visualized and within normal limits. No inflammatory changes to suggest appendicitis are noted. Small bowel and stomach are unremarkable. Vascular/Lymphatic: Aortic atherosclerosis. No enlarged abdominal or pelvic lymph nodes. Reproductive: Prostate is unremarkable. Other: Considerable free fluid is noted throughout the abdomen consistent with the known history of peritoneal dialysis. The peritoneal dialysis catheter is noted deep in the pelvis. No focal herniation is noted. Musculoskeletal: Degenerative changes of lumbar spine are noted. IMPRESSION: Peritoneal dialysis catheter with evidence of dialysate throughout the abdomen. No acute abnormality is identified correspond with the given clinical history. Electronically Signed   By: Inez Catalina M.D.   On: 01/27/2022 20:35    Labs: BMET Recent Labs  Lab 01/27/22 1957 01/28/22 0443  NA 132* 132*  K 3.1* 3.2*  CL 94* 96*  CO2 25 25  GLUCOSE 161* 107*  BUN 33* 36*  CREATININE 5.40* 5.49*  CALCIUM 9.3 9.0   CBC Recent Labs  Lab 01/27/22 1957 01/27/22 2352 01/28/22 0747 01/28/22 1610 01/29/22 0446  WBC 11.4*  --   --   --   --   NEUTROABS 9.5*  --   --   --   --   HGB 14.1 13.4 12.7* 12.9* 11.9*  HCT 39.8 38.1* 35.7* 36.9* 33.1*  MCV 94.8  --   --   --   --   PLT 292  --   --   --   --     Medications:     finasteride  5 mg Oral Daily   gentamicin cream  1 Application Topical Daily   levothyroxine  175 mcg Oral Q0600   metoprolol tartrate  5 mg Intravenous Once   ondansetron (ZOFRAN) IV  4 mg Intravenous Q6H   pantoprazole (PROTONIX) IV  40 mg Intravenous Q24H   polyethylene glycol  17 g Oral Daily   sertraline  50 mg Oral Daily   simvastatin  40 mg Oral QPM   sodium chloride flush  3 mL Intravenous Q12H   sucralfate  1 g Oral BID   vancomycin variable dose per unstable renal function (pharmacist dosing)   Does not apply See admin instructions   vortioxetine HBr  20 mg Oral Daily      Otelia Santee, MD 01/29/2022, 11:35 AM

## 2022-01-29 NOTE — Progress Notes (Signed)
Gastroenterology Inpatient Follow-up Note   PATIENT IDENTIFICATION  [name] is a 76 y.o. male Hospital Day: 3  SUBJECTIVE  Chart and labs reviewed this morning. Patient has had progressive troponinemia develop. Peritonitis noted based on fluid studies sent off by nephrology yesterday. No fevers or chills. Hemoglobin stable without recurrent episodes of hematemesis.   OBJECTIVE  Scheduled Inpatient Medications:   finasteride  5 mg Oral Daily   gentamicin cream  1 Application Topical Daily   gentamicin cream  1 Application Topical Daily   levothyroxine  175 mcg Oral Q0600   metoprolol tartrate  12.5 mg Oral BID   ondansetron (ZOFRAN) IV  4 mg Intravenous Q6H   pantoprazole (PROTONIX) IV  40 mg Intravenous Q24H   polyethylene glycol  17 g Oral Daily   sertraline  50 mg Oral Daily   simvastatin  40 mg Oral QPM   sodium chloride flush  3 mL Intravenous Q12H   sucralfate  1 g Oral BID   vancomycin variable dose per unstable renal function (pharmacist dosing)   Does not apply See admin instructions   vortioxetine HBr  20 mg Oral Daily   Continuous Inpatient Infusions:   sodium chloride     ceFEPime (MAXIPIME) IV 1 g (01/29/22 1040)   dialysis solution 1.5% low-MG/low-CA     dialysis solution 1.5% low-MG/low-CA     PRN Inpatient Medications: sodium chloride, docusate sodium, sodium chloride flush   Physical Examination  Temp:  [97.8 F (36.6 C)-98.5 F (36.9 C)] 98.3 F (36.8 C) (08/03 1900) Pulse Rate:  [68-132] 99 (08/03 1900) Resp:  [12-24] 18 (08/03 1900) BP: (93-121)/(60-84) 111/69 (08/03 1900) SpO2:  [92 %-96 %] 93 % (08/03 1900) Temp (24hrs), Avg:98.1 F (36.7 C), Min:97.8 F (36.6 C), Max:98.5 F (36.9 C)  Weight: 95 kg GEN: Resting in bed, appears chronically ill but is nontoxic PSYCH: Cooperative EYE: Conjunctivae pale-pink ENT: MMM CV: Tachycardic RESP: No audible wheezing GI: NABS, soft, mild TTP in mid epigastrium, no rebound MSK/EXT:  Bilateral lower extremity edema present SKIN: No jaundice NEURO:  Alert & Oriented x person, focal deficits are noted   Review of Data   Laboratory Studies   Recent Labs  Lab 01/28/22 0443  NA 132*  K 3.2*  CL 96*  CO2 25  BUN 36*  CREATININE 5.49*  GLUCOSE 107*  CALCIUM 9.0   Recent Labs  Lab 01/27/22 1957  AST 22  ALT 15  ALKPHOS 92    Recent Labs  Lab 01/27/22 1957 01/27/22 2352 01/29/22 1415  WBC 11.4*  --   --   HGB 14.1   < > 12.4*  HCT 39.8   < > 35.1*  PLT 292  --   --    < > = values in this interval not displayed.   No results for input(s): "APTT", "INR" in the last 168 hours.  Imaging Studies  No results found.  GI Procedures and Studies  No new studies to review   ASSESSMENT  [name] is a 76 y.o. 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  Patient's hemoglobin has been stable.  Overt dysphagia currently is not an issue.  Okay if no contraindication for Zofran use around-the-clock.  In the setting of patient's elevated troponin he is not felt to be a candidate for catheterization.  Thus he would not be an adequate candidate for  consideration of endoscopic evaluation nor do I think it is necessary at this time.  Treatment for peritonitis may help with nausea and vomiting and that is being pursued by the nephrology and medicine services.  No further inpatient GI work-up is recommended at this time.     PLAN/RECOMMENDATIONS  Continue Zofran 4 mg every 6 hour or Zofran 8 mg every 8 hour Continue bowel regimen as already outlined - MiraLAX daily - Consider fiber supplementation or stool softener Continue PPI 40 mg twice daily once patient is tolerating oral intake otherwise can maintain IV PPI for now Carafate 2-4 times daily can be considered for now twice daily seems reasonable No plan for endoscopic evaluation at this time  (and with patient's recent troponinemia and no cardiac recatheterization to be considered, he would be extremely high risk for any endoscopic evaluation) - He would have to show overt GI bleeding with transfusion dependent anemia for [place] to consider a high risk endoscopic reevaluation (and felt not likely to be needed since he had a normal EGD 1.5 years ago)   Inpatient GI service will sign off at this time.  Please page/call with questions or concerns.   Justice Britain, MD [facility] Office # [phone]    LOS: 1 day  [facility]  01/29/2022, 10:19 PM

## 2022-01-29 NOTE — Progress Notes (Signed)
Monitor shows A-fib w/RVR while coughing, HR 139. Decreasing to 114 - 125 at rest. Dr Doristine Bosworth notified of HR, A-fib, red MUSE, orders for Lopressor obtained.

## 2022-01-30 DIAGNOSIS — K650 Generalized (acute) peritonitis: Secondary | ICD-10-CM

## 2022-01-30 DIAGNOSIS — Z66 Do not resuscitate: Secondary | ICD-10-CM | POA: Diagnosis not present

## 2022-01-30 DIAGNOSIS — K922 Gastrointestinal hemorrhage, unspecified: Secondary | ICD-10-CM

## 2022-01-30 DIAGNOSIS — K920 Hematemesis: Secondary | ICD-10-CM | POA: Diagnosis not present

## 2022-01-30 DIAGNOSIS — R1311 Dysphagia, oral phase: Secondary | ICD-10-CM

## 2022-01-30 LAB — BODY FLUID CELL COUNT WITH DIFFERENTIAL
Eos, Fluid: 0 %
Lymphs, Fluid: 4 %
Monocyte-Macrophage-Serous Fluid: 12 % — ABNORMAL LOW (ref 50–90)
Neutrophil Count, Fluid: 84 % — ABNORMAL HIGH (ref 0–25)
Total Nucleated Cell Count, Fluid: 2100 cu mm — ABNORMAL HIGH (ref 0–1000)

## 2022-01-30 LAB — BASIC METABOLIC PANEL
Anion gap: 9 (ref 5–15)
BUN: 30 mg/dL — ABNORMAL HIGH (ref 8–23)
CO2: 25 mmol/L (ref 22–32)
Calcium: 8.5 mg/dL — ABNORMAL LOW (ref 8.9–10.3)
Chloride: 100 mmol/L (ref 98–111)
Creatinine, Ser: 4.97 mg/dL — ABNORMAL HIGH (ref 0.61–1.24)
GFR, Estimated: 11 mL/min — ABNORMAL LOW (ref >60)
Glucose, Bld: 99 mg/dL (ref 70–99)
Potassium: 3 mmol/L — ABNORMAL LOW (ref 3.5–5.1)
Sodium: 134 mmol/L — ABNORMAL LOW (ref 135–145)

## 2022-01-30 LAB — VANCOMYCIN, RANDOM: Vancomycin Rm: 17 ug/mL

## 2022-01-30 LAB — CBC
HCT: 33.9 % — ABNORMAL LOW (ref 39.0–52.0)
Hemoglobin: 11.4 g/dL — ABNORMAL LOW (ref 13.0–17.0)
MCH: 32.9 pg (ref 26.0–34.0)
MCHC: 33.6 g/dL (ref 30.0–36.0)
MCV: 98 fL (ref 80.0–100.0)
Platelets: 208 10*3/uL (ref 150–400)
RBC: 3.46 MIL/uL — ABNORMAL LOW (ref 4.22–5.81)
RDW: 13.1 % (ref 11.5–15.5)
WBC: 9.9 10*3/uL (ref 4.0–10.5)
nRBC: 0 % (ref 0.0–0.2)

## 2022-01-30 MED ORDER — DELFLEX-LC/1.5% DEXTROSE 344 MOSM/L IP SOLN: Freq: Every day | INTRAPERITONEAL | Status: DC

## 2022-01-30 MED ORDER — POTASSIUM CHLORIDE 20 MEQ PO PACK
20.0000 meq | PACK | Freq: Two times a day (BID) | ORAL | Status: AC
  Administered 2022-01-30 (×2): 20 meq via ORAL
  Filled 2022-01-30 (×2): qty 1

## 2022-01-30 MED ORDER — VANCOMYCIN HCL 1500 MG/300ML IV SOLN
1500.0000 mg | Freq: Once | INTRAVENOUS | Status: AC
Start: 2022-01-30 — End: 2022-01-30
  Administered 2022-01-30: 1500 mg via INTRAVENOUS
  Filled 2022-01-30: qty 300

## 2022-01-30 MED ORDER — GENTAMICIN SULFATE 0.1 % EX CREA: 1.0000 | TOPICAL_CREAM | Freq: Every day | CUTANEOUS | Status: DC

## 2022-01-30 NOTE — Plan of Care (Signed)
  Problem: Clinical Measurements: Goal: Ability to maintain clinical measurements within normal limits will improve 01/30/2022 0639 by Henrine Screws, RN Outcome: Progressing 01/30/2022 0639 by Henrine Screws, RN Outcome: Progressing   Problem: Clinical Measurements: Goal: Will remain free from infection 01/30/2022 0639 by Henrine Screws, RN Outcome: Progressing 01/30/2022 0639 by Henrine Screws, RN Outcome: Progressing   Problem: Clinical Measurements: Goal: Diagnostic test results will improve 01/30/2022 0639 by Henrine Screws, RN Outcome: Progressing 01/30/2022 0639 by Henrine Screws, RN Outcome: Progressing   Problem: Clinical Measurements: Goal: Cardiovascular complication will be avoided 01/30/2022 1287 by Henrine Screws, RN Outcome: Progressing 01/30/2022 0639 by Henrine Screws, RN Outcome: Progressing   Problem: Activity: Goal: Risk for activity intolerance will decrease 01/30/2022 0639 by Henrine Screws, RN Outcome: Progressing 01/30/2022 [phone] by Henrine Screws, RN Outcome: Progressing

## 2022-01-30 NOTE — Plan of Care (Signed)
Tolerating BB. If systolic blood pressures remain < 100 mmHg, can reduce to metop 12.5 mg XL daily as an outpatient. Please let us know if anything changes. Otherwise cardiology will sign off.

## 2022-01-30 NOTE — Progress Notes (Addendum)
PROGRESS NOTE    [name]  EPP:[id number] DOB: 1946-01-12 DOA: 01/27/2022 PCP: Celene Squibb, MD   Brief Narrative:  76 y/o male with history of ESRD on PD, history of CVA, chronic dysphagia, presented with nausea, vomiting and 2 episodes of hematemesis. Nephrology and GI consulted. Due to multiple medical issues, palliative care also consulted for goals of care.  Assessment & Plan:   Upper GI bleed acute blood loss anemia: -Patient presents to the ER for evaluation of 2 episodes of hematemesis.  Patient transferred to [facility] evaluation.  He was started on Protonix drip, aspirin was placed on hold.  Hemoglobin dropped from 14.1-11.9-12.4.  No further hematemesis episodes during this hospitalization.  Seen by GI, no plans for EGD,They switched him from Protonix drip to daily IV Protonix.GI signed off.   acute peritonitis in the setting of peritoneal dialysis: - Peritoneal fluid analysis suggestive of peritonitis.  Nephrology managing this -On cefepime and vancomycin per pharmacy  Elevated troponin: -Patient's troponin trended up to 1124.  Cardiology consulted on 8/3 -Recommended patient is not a good candidate for cardiac intervention.  Patient denies any chest pain.  Started on beta-blocker.  Cardiology recommended to decrease metoprolol dose to 12.5 mg daily if BP remains less than 100.  Cardiology signed off.  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 appreciate help    Failure to thrive in adult -Patient with a history of adult failure to thrive secondary to multiple medical problems.  Palliative on board-appreciate help    History of CVA (cerebrovascular accident) Oropharyngeal dysphagia -Patient has a history of CVA and is currently bedbound and requires assistance with all activities of daily living.  Has some baseline dysarthria as well. -Seen by palliative care during his last hospitalization -Patient will need frequent  turning since he has a stage II decubitus ulcer involving the right buttock to prevent development of new pressure injuries related to his bedbound state   Hypokalemia: replaced   Acquired hypothyroidism: on Synthroid.   Hyperlipidemia: on Zocor.   Paroxysmal Atrial Flutter - This was a new diagnosis for him during his prior admission in May 2023.  And noted on repeat EKG during his last office visit. Recent Zio patch showed his predominant rhythm was atrial flutter with variable conduction and average heart rate in the 50's and no episodes of RVR.  He remains off AV nodal blocking agents for now given his baseline bradycardia and denies any recent palpitations.  - His CHA2DS2-VASc Score is equal to 9.7 % stroke rate/year from a score of 6(HTN, Vascular, Age (2), PE (2)). By review of notes, he was not felt to be an anticoagulation candidate given his prior GI bleeding while on anticoagulation and also not an optimal candidate for Watchman device placement given his co morbidities. He is now having frequent falls and hitting his head which also makes anticoagulation not ideal.    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  Most recent echocardiogram in 08/2021 showed elevated gradients across the valve with AVA at 0.7 cm. Will continue to follow with cardiology as outpatient.   Mitral stenosis - Echocardiogram in 08/2021 showed moderate MS and moderate TR. Will continue to follow with cardiology as outpatient.   Pressure injury of skin -Patient noted to have stage II decubitus ulcer involving the right buttock, POA Wound care consult   DVT prophylaxis: SCD Code Status: DNR Family Communication:  None present at  bedside.  Plan of care discussed with patient in length and he verbalized understanding and agreed with it. Disposition Plan: To be determined  Consultants:  Nephrology GI Palliative care Cardiology  Procedures:  None  Antimicrobials:   Vancomycin Cefepime  Status is: Inpatient     Subjective: Patient seen and examined.  Resting comfortably on the bed.  No nausea, vomiting, fever, chills.  No acute events overnight.  He denies any chest pain today.  Objective: Vitals:   01/29/22 2200 01/29/22 2254 01/29/22 2300 01/30/22 0300  BP: 101/63 (!) '88/61 98/62 93/63 '$  Pulse: 71 75 70 68  Resp:  '17 16 15  '$ Temp:   98.1 F (36.7 C) 97.9 F (36.6 C)  TempSrc:   Oral Oral  SpO2:  96% 93% 94%  Weight:      Height:        Intake/Output Summary (Last 24 hours) at 01/30/2022 0906 Last data filed at 01/30/2022 0640 Gross per 24 hour  Intake 290 ml  Output 250 ml  Net 40 ml   Filed Weights   01/27/22 1910  Weight: 95 kg    Examination:  General exam: Appears calm and comfortable, on room air, Respiratory system: Clear to auscultation. Respiratory effort normal. Cardiovascular system: Systolic murmur noted, RRR. No JVD,  No pedal edema. Gastrointestinal system: Abdomen is nondistended, mild abdominal tenderness positive, no guarding, no rigidity, peritoneal dialysis catheter noted.  No signs of bleeding or discharge around the catheter site. Central nervous system: Alert, following commands, has some dysarthria from previous stroke.   Extremities: Symmetric 5 x 5 power.    Data Reviewed: I have personally reviewed following labs and imaging studies  CBC: Recent Labs  Lab 01/27/22 1957 01/27/22 2352 01/28/22 0747 01/28/22 1610 01/29/22 0446 01/29/22 1415  WBC 11.4*  --   --   --   --   --   NEUTROABS 9.5*  --   --   --   --   --   HGB 14.1 13.4 12.7* 12.9* 11.9* 12.4*  HCT 39.8 38.1* 35.7* 36.9* 33.1* 35.1*  MCV 94.8  --   --   --   --   --   PLT 292  --   --   --   --   --    Basic Metabolic Panel: Recent Labs  Lab 01/27/22 1957 01/28/22 0443  NA 132* 132*  K 3.1* 3.2*  CL 94* 96*  CO2 25 25  GLUCOSE 161* 107*  BUN 33* 36*  CREATININE 5.40* 5.49*  CALCIUM 9.3 9.0   GFR: Estimated Creatinine  Clearance: 12.8 mL/min (A) (by C-G formula based on SCr of 5.49 mg/dL (H)). Liver Function Tests: Recent Labs  Lab 01/27/22 1957  AST 22  ALT 15  ALKPHOS 92  BILITOT 0.5  PROT 6.9  ALBUMIN 2.5*   No results for input(s): "LIPASE", "AMYLASE" in the last 168 hours. No results for input(s): "AMMONIA" in the last 168 hours. Coagulation Profile: No results for input(s): "INR", "PROTIME" in the last 168 hours. Cardiac Enzymes: No results for input(s): "CKTOTAL", "CKMB", "CKMBINDEX", "TROPONINI" in the last 168 hours. BNP (last 3 results) No results for input(s): "PROBNP" in the last 8760 hours. HbA1C: No results for input(s): "HGBA1C" in the last 72 hours. CBG: Recent Labs  Lab 01/28/22 0953  GLUCAP 100*   Lipid Profile: No results for input(s): "CHOL", "HDL", "LDLCALC", "TRIG", "CHOLHDL", "LDLDIRECT" in the last 72 hours. Thyroid Function Tests: No results for input(s): "TSH", "T4TOTAL", "FREET4", "  T3FREE", "THYROIDAB" in the last 72 hours. Anemia Panel: No results for input(s): "VITAMINB12", "FOLATE", "FERRITIN", "TIBC", "IRON", "RETICCTPCT" in the last 72 hours. Sepsis Labs: No results for input(s): "PROCALCITON", "LATICACIDVEN" in the last 168 hours.  Recent Results (from the past 240 hour(s))  Body fluid culture w Gram Stain     Status: None (Preliminary result)   Collection Time: 01/28/22  3:36 PM   Specimen: Peritoneal Dialysate; Body Fluid  Result Value Ref Range Status   Specimen Description PERITONEAL DIALYSATE  Final   Special Requests Normal  Final   Gram Stain   Final    CYTOSPIN SMEAR WBC PRESENT,BOTH PMN AND MONONUCLEAR GRAM POSITIVE COCCI IN PAIRS IN SINGLES    Culture   Final    CULTURE REINCUBATED FOR BETTER GROWTH Performed at [facility], [place] [address] [place], [place] [address]    Report Status PENDING  Incomplete      Radiology Studies: No results found.  Scheduled Meds:  finasteride  5 mg Oral Daily   gentamicin cream  1  Application Topical Daily   gentamicin cream  1 Application Topical Daily   levothyroxine  175 mcg Oral Q0600   metoprolol tartrate  12.5 mg Oral BID   ondansetron (ZOFRAN) IV  4 mg Intravenous Q6H   pantoprazole (PROTONIX) IV  40 mg Intravenous Q24H   polyethylene glycol  17 g Oral Daily   sertraline  50 mg Oral Daily   simvastatin  40 mg Oral QPM   sodium chloride flush  3 mL Intravenous Q12H   sucralfate  1 g Oral BID   vancomycin variable dose per unstable renal function (pharmacist dosing)   Does not apply See admin instructions   vortioxetine HBr  20 mg Oral Daily   Continuous Infusions:  sodium chloride     ceFEPime (MAXIPIME) IV Stopped (01/29/22 1156)   dialysis solution 1.5% low-MG/low-CA     dialysis solution 1.5% low-MG/low-CA     vancomycin       LOS: 2 days   Time spent: 35 minutes   Jaiden Wahab Loann Quill, MD [facility]  If 7PM-7AM, please contact night-coverage [email or web address] 01/30/2022, 9:06 AM

## 2022-01-30 NOTE — Evaluation (Signed)
Clinical/Bedside Swallow Evaluation Patient Details  Name: [name] MRN: [id number] Date of Birth: February 12, 1946  Today's Date: 01/30/2022 Time: SLP Start Time (ACUTE ONLY): 0930 SLP Stop Time (ACUTE ONLY): 1010 SLP Time Calculation (min) (ACUTE ONLY): 40 min  Past Medical History:  Past Medical History:  Diagnosis Date   Aortic stenosis    Status post AVR, 25 mm Edwards pericardial Magna-Ease valve 2014   Arthritis    CAD (coronary artery disease)    Status post SVG to OM 2014   COPD (chronic obstructive pulmonary disease) ([place])    Depression    Dialysis patient ([place])    on PD 7 night weekly at home   ESRD on peritoneal dialysis [facility])    Essential hypertension    Gout    Hemorrhoid    Hypertension    Hypothyroidism    Kidney stones    Lumbar spondylolysis    Mixed hyperlipidemia    Orthostatic hypotension    Osteoarthritis    Pulmonary emboli ([place]) 08/21/2020   Syncope    Vitamin D deficiency    Past Surgical History:  Past Surgical History:  Procedure Laterality Date   ANGIOPASTY     AORTIC VALVE REPLACEMENT  07/11/2012   Procedure: AORTIC VALVE REPLACEMENT (AVR);  Surgeon: Gaye Pollack, MD;  Location: [facility];  Service: Open Heart Surgery;  Laterality: N/A;   AV FISTULA PLACEMENT Left 05/30/2020   Procedure: ARTERIOVENOUS (AV) FISTULA CREATION LEFT;  Surgeon: [name], MD;  Location: [facility];  Service: Vascular;  Laterality: Left;   CARDIAC CATHETERIZATION     CAROTID ENDARTERECTOMY Left    CATARACT EXTRACTION W/PHACO Right 06/09/2021   Procedure: CATARACT EXTRACTION RIGHT EYE W/ PHACO AND INTRAOCULAR LENS PLACEMENT ([place]);  Surgeon: Baruch Goldmann, MD;  Location: [facility];  Service: Ophthalmology;  Laterality: Right;  CDE: 12.97   CATARACT EXTRACTION W/PHACO Left 06/27/2021   Procedure: CATARACT EXTRACTION PHACO AND INTRAOCULAR LENS PLACEMENT ([place]) WITH IACCESS GONIOTOMY;  Surgeon: Baruch Goldmann, MD;  Location: [facility];  Service: Ophthalmology;  Laterality: Left;   CDE: 10.08   CIRCUMCISION     COLONOSCOPY WITH PROPOFOL N/A 07/16/2020   Procedure: COLONOSCOPY WITH PROPOFOL;  Surgeon: Doran Stabler, MD;  Location: [place];  Service: Gastroenterology;  Laterality: N/A;   CORONARY ARTERY BYPASS GRAFT  07/11/2012   Procedure: CORONARY ARTERY BYPASS GRAFTING (CABG);  Surgeon: Gaye Pollack, MD;  Location: [place];  Service: Open Heart Surgery;  Laterality: N/A;  CABG x one,  using right leg greater saphenous vein harvested endoscopically   ESOPHAGOGASTRODUODENOSCOPY (EGD) WITH PROPOFOL N/A 07/15/2020   Procedure: ESOPHAGOGASTRODUODENOSCOPY (EGD) WITH PROPOFOL;  Surgeon: Doran Stabler, MD;  Location: [place];  Service: Gastroenterology;  Laterality: N/A;   HOT HEMOSTASIS N/A 07/16/2020   Procedure: HOT HEMOSTASIS (ARGON PLASMA COAGULATION/BICAP);  Surgeon: Doran Stabler, MD;  Location: [place];  Service: Gastroenterology;  Laterality: N/A;   INSERTION OF ANTERIOR SEGMENT AQUEOUS DRAINAGE DEVICE ([place]) Right 06/09/2021   Procedure: INSERTION OF RIGHT EYE ANTERIOR SEGMENT AQUEOUS DRAINAGE DEVICE (ISTENT);  Surgeon: Baruch Goldmann, MD;  Location: [facility];  Service: Ophthalmology;  Laterality: Right;  CDE: 12.97   INTRAOPERATIVE TRANSESOPHAGEAL ECHOCARDIOGRAM  07/11/2012   Procedure: INTRAOPERATIVE TRANSESOPHAGEAL ECHOCARDIOGRAM;  Surgeon: Gaye Pollack, MD;  Location: [facility];  Service: Open Heart Surgery;  Laterality: N/A;   IR PERC TUN PERIT CATH WO PORT S&I /IMAG  06/13/2020   IR REMOVAL TUN CV CATH W/O FL  07/26/2020  IR US GUIDE VASC ACCESS RIGHT  06/13/2020   JOINT REPLACEMENT Bilateral    knees   TEE WITHOUT CARDIOVERSION N/A 06/12/2020   Procedure: TRANSESOPHAGEAL ECHOCARDIOGRAM (TEE);  Surgeon: Skeet Latch, MD;  Location: [facility];  Service: Cardiovascular;  Laterality: N/A;   HPI:  76 yo male admitted 01/27/22 with hematemesis. PMH: FTT with significant weight loss, ESRD on peritoneal dialysis, CVA with dysarthria,  bedbound, chronic dysphagia on puree diet (likely related to CVA), AVR, PAFlutter, CAD, PE, hypothyroidism, aortic stenosis, COPD, depression, gout, kidney stones, lumbar spondylosis, HLD, orthostatic hypotension, OA. Palliative Care on board.    Assessment / Plan / Recommendation  Clinical Impression  Pt seen at bedside for swallow evaluation. Pt was awake and alert. Speech is significantly dysarthric and difficult to understand. Pt had difficulty following commands for CN exam, however, no obvious unilateral orofacial weakness was observed. Oral care was completed with suction. Pt is edentulous. Oral cavity noted to be pink, moist, and healthy. Pt accepted trials of ice chips, puree, nectar and honey thick liquids. Multimodal cues required for chin tuck, however, pt continues to lift his head prior to swallowing. Intermittent cough response noted after ice chips, and thickened liquids, raising concern for airway compromise. MBS was performed 12/26/21, with aspiration of liquids if chin tuck not executed. Repeat MBS will likely show a deterioration of swallow function rather than improvement based on presentation at bedside today. At this time, recommend puree diet and honey thick liquids with crushed meds, 1:1 assistance with all PO intake to maximize swallow safety. SLP will follow to assess diet tolerance and provide education regarding aspiration risk. Safe swallow precautions posted at [facility].  SLP Visit Diagnosis: Dysphagia, unspecified (R13.10)    Aspiration Risk  Moderate aspiration risk;Severe aspiration risk;Risk for inadequate nutrition/hydration    Diet Recommendation Dysphagia 1 (Puree);Honey-thick liquid   Liquid Administration via: Cup;Straw Medication Administration: Crushed with puree Supervision: Full supervision/cueing for compensatory strategies;Staff to assist with self feeding Compensations: Minimize environmental distractions;Small sips/bites;Slow rate Postural Changes: Seated  upright at 90 degrees;Remain upright for at least 30 minutes after po intake    Other  Recommendations Oral Care Recommendations: Oral care BID Other Recommendations: Order thickener from pharmacy;Have oral suction available    Recommendations for follow up therapy are one component of a multi-disciplinary discharge planning process, led by the attending physician.  Recommendations may be updated based on patient status, additional functional criteria and insurance authorization.  Follow up Recommendations Follow physician's recommendations for discharge plan and follow up therapies      Assistance Recommended at Discharge Frequent or constant Supervision/Assistance  Functional Status Assessment Patient has had a recent decline in their functional status and/or demonstrates limited ability to make significant improvements in function in a reasonable and predictable amount of time   Frequency and Duration min 1 x/week  1 week;2 weeks       Prognosis Prognosis for Safe Diet Advancement: Guarded Barriers to Reach Goals: Cognitive deficits;Time post onset;Severity of deficits      Swallow Study   General Date of Onset: 01/27/22 HPI: 76 yo male admitted 01/27/22 with hematemesis. PMH: FTT with significant weight loss, ESRD on peritoneal dialysis, CVA with dysarthria, bedbound, chronic dysphagia on puree diet (likely related to CVA), AVR, PAFlutter, CAD, PE, hypothyroidism, aortic stenosis, COPD, depression, gout, kidney stones, lumbar spondylosis, HLD, orthostatic hypotension, OA. Palliative Care on board. Previous Swallow Assessment: Most recent MBS 12/16/21 - Dys1/NTL with head neutral (aspirates if head is back) Thin with chin tuck,  however, difficulty with carryover. BSE/follow up 7/7 and 01/05/22 with rec for dys2/NTL, crushed meds Diet Prior to this Study: Honey-thick liquids;Other (Comment) (clear liquids) Temperature Spikes Noted: No Respiratory Status: Room air History of Recent  Intubation: No Behavior/Cognition: Alert;Cooperative;Requires cueing;Distractible Oral Cavity Assessment: Within Functional Limits Oral Care Completed by SLP: Yes Oral Cavity - Dentition: Edentulous Vision: Functional for self-feeding Self-Feeding Abilities: Total assist Patient Positioning: Upright in bed Baseline Vocal Quality: Low vocal intensity Volitional Cough: Strong Volitional Swallow: Able to elicit    Oral/Motor/Sensory Function Overall Oral Motor/Sensory Function: Generalized oral weakness   Ice Chips Ice chips: Impaired Presentation: Spoon Pharyngeal Phase Impairments: Cough - Immediate   Thin Liquid Thin Liquid: Not tested    Nectar Thick Nectar Thick Liquid: Impaired Presentation: Straw Pharyngeal Phase Impairments: Cough - Immediate Other Comments: chin tuck encouraged   Honey Thick Honey Thick Liquid: Impaired Presentation: Straw Pharyngeal Phase Impairments: Cough - Delayed Other Comments: intermittent cough   Puree Puree: Within functional limits Presentation: Spoon   Solid     Solid: Not tested     Krishauna Schatzman B. Quentin Ore, [facility], [place] Speech Language Pathologist Office: [phone]  Shonna Chock 01/30/2022,10:25 AM

## 2022-01-30 NOTE — Progress Notes (Signed)
[facility] Progress Note   76 y.o. male HTN, CVA with residual dysarthria + dysphagia, bedbound on pureed diet, AVR, a flutter, CASHD, h/o PE, hypothyroidism, ESRD on PD. His spouse is his caregiver and she states that she had filled the abdomen with 3L 1.5% and then had to stop because of hematemesis x2 with bright red blood. He has had anorexia with nausea and vomiting with associated periumbilical pain in the past few days. She has also noted gargling recently and thought that was from the oropharyngeal dysphagia. He has been treated with Zofran for nausea in the past.   Dialysis Orders:  [facility] 5 cycles over 11 hours 1.5%. 3L fill. Last fill icodextran 2.5L.  Outpatient PD RN Malachy Mood [phone]   Assessment/ Plan:   Hematemesis - Per primary.  ESRD -  Continue [facility]CPD. No signs of peritonitis but will send fluid for analysis; per spouse it is cloudy initially 1st couple drains and then clears up (fibrin?). It's been studied and they haven't been told he had peritonitis -> appears to have peritonitis w/ cell count of 10K with 89% PMN's.  - Started on Cefepime (8/3/ is D1) + Vanco (pharmacy to dose) (has already received 1.5gm Vanco x2)  - Cont 1.5% bags for now; we don't have Icodextran here; therefore, no day dwell here. Still hooked up but treatment is complete; contacted dialysis unit to disconnect and will send for cell count again today.   Will replete K.  Hypertension/volume  - Blood pressure ok. No gross volume on exam.  Anemia  - Hgb >12 No ESA needs  Metabolic bone disease -  Corr Ca slightly elevated; will check phos. No binders for now. Nutrition - Dysphagia diet usually. H/o CVA with dysarthria, some left upper arm weakness, cognitive challenges as well. FTT/Weight loss - Significant weight loss and progressive weakness noted. Palliative care has been consulted. Guarded prognosis. DNR.   Subjective:   Abd discomfort better; denies nausea or  shortness of breath   Objective:   BP 93/63 (BP Location: Right Arm)   Pulse 68   Temp 97.9 F (36.6 C) (Oral)   Resp 15   Ht '5\' 8"'$  (1.727 m)   Wt 95 kg   SpO2 94%   BMI 31.84 kg/m   Intake/Output Summary (Last 24 hours) at 01/30/2022 1017 Last data filed at 01/30/2022 0640 Gross per 24 hour  Intake 290 ml  Output 250 ml  Net 40 ml   Weight change:   Physical Exam: General: Alert, oriented to self  Heart: RRR Lungs: Clear bilaterally  Abdomen: soft, non-tender, PD catheter on right side, no erythema  Extremities: No LE edema  Back: no flank tenderness Neuro: tremors and some weakness left arm, dysarthria GU: condom catheter in place Dialysis Access: PD catheter in place + left Cimino (+bruit)  Imaging: No results found.  Labs: BMET Recent Labs  Lab 01/27/22 1957 01/28/22 0443 01/30/22 0638  NA 132* 132* 134*  K 3.1* 3.2* 3.0*  CL 94* 96* 100  CO2 '25 25 25  '$ GLUCOSE 161* 107* 99  BUN 33* 36* 30*  CREATININE 5.40* 5.49* 4.97*  CALCIUM 9.3 9.0 8.5*   CBC Recent Labs  Lab 01/27/22 1957 01/27/22 2352 01/28/22 1610 01/29/22 0446 01/29/22 1415 01/30/22 0638  WBC 11.4*  --   --   --   --  9.9  NEUTROABS 9.5*  --   --   --   --   --   HGB 14.1   < >  12.9* 11.9* 12.4* 11.4*  HCT 39.8   < > 36.9* 33.1* 35.1* 33.9*  MCV 94.8  --   --   --   --  98.0  PLT 292  --   --   --   --  208   < > = values in this interval not displayed.    Medications:     finasteride  5 mg Oral Daily   gentamicin cream  1 Application Topical Daily   gentamicin cream  1 Application Topical Daily   levothyroxine  175 mcg Oral Q0600   metoprolol tartrate  12.5 mg Oral BID   ondansetron (ZOFRAN) IV  4 mg Intravenous Q6H   pantoprazole (PROTONIX) IV  40 mg Intravenous Q24H   polyethylene glycol  17 g Oral Daily   sertraline  50 mg Oral Daily   simvastatin  40 mg Oral QPM   sodium chloride flush  3 mL Intravenous Q12H   sucralfate  1 g Oral BID   vancomycin variable dose per  unstable renal function (pharmacist dosing)   Does not apply See admin instructions   vortioxetine HBr  20 mg Oral Daily      Otelia Santee, MD 01/30/2022, 10:17 AM

## 2022-01-30 NOTE — Progress Notes (Signed)
Pharmacy Antibiotic Note- follow-up  [name] is a 76 y.o. male admitted on 01/27/2022 with  peritonitis .  Pharmacy has been consulted for vancomycin dosing.  Plan: Vancomycin 1500 mg iv X1 dose Vanco random AM 01/31/2022  Height: '5\' 8"'$  (172.7 cm) Weight: 95 kg (209 lb 7 oz) IBW/kg (Calculated) : 68.4  Temp (24hrs), Avg:98.1 F (36.7 C), Min:97.8 F (36.6 C), Max:98.5 F (36.9 C)  Recent Labs  Lab 01/27/22 1957 01/28/22 0443 01/30/22 0638  WBC 11.4*  --   --   CREATININE 5.40* 5.49*  --   VANCORANDOM  --   --  17     Estimated Creatinine Clearance: 12.8 mL/min (A) (by C-G formula based on SCr of 5.49 mg/dL (H)).    No Known Allergies  Antimicrobials this admission: cefepime 8/3 >>   Vanc 8/3 >>    Microbiology results: 8/2 Peritoneal Dialysate: WBC present, both PMN, mononuclear, GPC in pairs in singles. This sample has been re-incubated for better growth and identification  Thank you for allowing pharmacy to be a part of this patient's care.  Vaughan Basta BS, PharmD, BCPS Clinical Pharmacist 01/30/2022 8:19 AM  Contact: [phone] after 3 PM  "Be curious, not judgmental..." -Jamal Maes

## 2022-01-31 DIAGNOSIS — K920 Hematemesis: Secondary | ICD-10-CM | POA: Diagnosis not present

## 2022-01-31 LAB — BASIC METABOLIC PANEL
Anion gap: 11 (ref 5–15)
BUN: 29 mg/dL — ABNORMAL HIGH (ref 8–23)
CO2: 25 mmol/L (ref 22–32)
Calcium: 8.7 mg/dL — ABNORMAL LOW (ref 8.9–10.3)
Chloride: 97 mmol/L — ABNORMAL LOW (ref 98–111)
Creatinine, Ser: 4.85 mg/dL — ABNORMAL HIGH (ref 0.61–1.24)
GFR, Estimated: 12 mL/min — ABNORMAL LOW (ref >60)
Glucose, Bld: 132 mg/dL — ABNORMAL HIGH (ref 70–99)
Potassium: 2.8 mmol/L — ABNORMAL LOW (ref 3.5–5.1)
Sodium: 133 mmol/L — ABNORMAL LOW (ref 135–145)

## 2022-01-31 LAB — VANCOMYCIN, RANDOM: Vancomycin Rm: 31 ug/mL

## 2022-01-31 LAB — MAGNESIUM: Magnesium: 1.3 mg/dL — ABNORMAL LOW (ref 1.7–2.4)

## 2022-01-31 MED ORDER — DELFLEX-LC/1.5% DEXTROSE 344 MOSM/L IP SOLN: Freq: Every day | INTRAPERITONEAL | Status: DC

## 2022-01-31 MED ORDER — POTASSIUM CHLORIDE CRYS ER 20 MEQ PO TBCR
40.0000 meq | EXTENDED_RELEASE_TABLET | Freq: Once | ORAL | Status: AC
Start: 2022-01-31 — End: 2022-01-31
  Administered 2022-01-31: 40 meq via ORAL
  Filled 2022-01-31: qty 2

## 2022-01-31 MED ORDER — POTASSIUM CHLORIDE 10 MEQ/100ML IV SOLN
10.0000 meq | INTRAVENOUS | Status: AC
  Administered 2022-01-31 (×4): 10 meq via INTRAVENOUS
  Filled 2022-01-31 (×4): qty 100

## 2022-01-31 MED ORDER — MAGNESIUM SULFATE 4 GM/100ML IV SOLN
4.0000 g | Freq: Once | INTRAVENOUS | Status: AC
  Administered 2022-01-31: 4 g via INTRAVENOUS
  Filled 2022-01-31: qty 100

## 2022-01-31 MED ORDER — GENTAMICIN SULFATE 0.1 % EX CREA
1.0000 | TOPICAL_CREAM | Freq: Every day | CUTANEOUS | Status: DC
  Administered 2022-01-31 – 2022-02-02 (×3): 1 via TOPICAL

## 2022-01-31 NOTE — Evaluation (Signed)
Occupational Therapy Evaluation and Discharge Patient Details Name: [name] MRN: [id number] DOB: 1945/09/21 Today's Date: 01/31/2022   History of Present Illness The pt is a 76 yo male presenting with hematemeis. Pt found to have GI leed and intractable nausea and vomiting. PMH includes: end-stage renal disease on PD, CVA, aflutter, CAD, COPD, PE, hypothyroidism, and AVR.   Clinical Impression   This 76 yo male admitted with above presents to acute OT with PLOF of needing A for bathing/dressing/toileting at bed level. He could do some of his grooming in supported sitting and feed himself seated in his wheelchair per his report and he needed +1 A from wife for bed mobility and stand pivot transfers. He currently is max A for bed mobility and total A for maintaining EOB balance.He can still self feed and wash his face and hands at a bed level post appropriate set up. No further skilled OT needs. I do agree with PT that if wife wants him home he would benefit from a hoyer lift and possibly a hospital bed. Acute OT will sign off.      Recommendations for follow up therapy are one component of a multi-disciplinary discharge planning process, led by the attending physician.  Recommendations may be updated based on patient status, additional functional criteria and insurance authorization.   Follow Up Recommendations  No OT follow up    Assistance Recommended at Discharge Frequent or constant Supervision/Assistance  Patient can return home with the following Two people to help with walking and/or transfers;A lot of help with bathing/dressing/bathroom;Assistance with feeding;Assistance with cooking/housework;Assist for transportation;Direct supervision/assist for financial management;Direct supervision/assist for medications management    Functional Status Assessment  Patient has not had a recent decline in their functional status (from an OT standpoint)  Equipment Recommendations  Hospital bed  (hoyer lift (if he does not have these and wife wants him home))       Precautions / Restrictions Precautions Precautions: Fall Precaution Comments: incontinent of bowel/bladder Restrictions Weight Bearing Restrictions: No      Mobility Bed Mobility Overal bed mobility: Needs Assistance Bed Mobility: Sit to Supine Rolling: Mod assist Sidelying to sit: Max assist, HOB elevated   Sit to supine: Max assist   General bed mobility comments: Pt reaching for end of bed upon sitting up at EOB--kept missing his grasp on foot board. Pt unable to scoot to EOB by himself and could not maintain static sitting balance on his own even with attempt at Bil UE support        Balance Overall balance assessment: Needs assistance Sitting-balance support: Bilateral upper extremity supported, Feet supported Sitting balance-Leahy Scale: Zero Sitting balance - Comments: Did attempt to correct balance but unable to Postural control: Right lateral lean, Posterior lean                                 ADL either performed or assessed with clinical judgement   ADL Overall ADL's : Needs assistance/impaired Eating/Feeding: Minimal assistance;Bed level   Grooming: Minimal assistance;Bed level;Wash/dry face;Wash/dry hands   Upper Body Bathing: Moderate assistance;Bed level   Lower Body Bathing: Total assistance;Bed level   Upper Body Dressing : Maximal assistance;Bed level   Lower Body Dressing: Total assistance;Bed level                        Pertinent Vitals/Pain Pain Assessment Pain Assessment: Faces Faces Pain Scale: Hurts  little more Pain Location: abdomen--with sitting up from supine on EOB Pain Descriptors / Indicators: Grimacing, Aching, Tightness Pain Intervention(s): Limited activity within patient's tolerance, Monitored during session, Repositioned     Hand Dominance Right   Extremity/Trunk Assessment Upper Extremity Assessment Upper Extremity  Assessment: Generalized weakness RUE Deficits / Details: grossly functional to MMT, limited by poor motor planning and awareness. pt with limited functional use for transfers RUE Sensation: WNL LUE Deficits / Details: grossly functional to MMT, limited by poor motor planning and awareness. pt with limited functional use for transfers       Communication Communication Communication:  (dysarthric speech)   Cognition Arousal/Alertness: Awake/alert Behavior During Therapy: Flat affect Overall Cognitive Status: No family/caregiver present to determine baseline cognitive functioning Area of Impairment: Memory, Orientation, Following commands, Safety/judgement, Awareness, Problem solving                 Orientation Level: Place, Time, Situation     Following Commands: Follows one step commands inconsistently, Follows one step commands with increased time Safety/Judgement: Decreased awareness of safety, Decreased awareness of deficits Awareness: Intellectual Problem Solving: Slow processing, Decreased initiation, Difficulty sequencing, Requires tactile cues, Requires verbal cues General Comments: cues for sequencing each tasks                Home Living Family/patient expects to be discharged to:: Private residence Living Arrangements: Spouse/significant other Available Help at Discharge: Family;Personal care attendant (wife works from home; caregiver in and out throughout day (they live next door)) Type of Home: House Home Access: Ramped entrance     Home Layout: One level     Bathroom Shower/Tub: Teacher, early years/pre: Standard Bathroom Accessibility: No   Home Equipment: Conservation officer, nature (2 wheels);Rollator (4 wheels);Hospital bed;Tub bench;Wheelchair - manual;BSC/3in1;Electric scooter          Prior Functioning/Environment Prior Level of Function : Needs assist             Mobility Comments: pt limited to pivot transfers with RW and assist from  wife from bed to [facility] ADLs Comments: aide assists with bathing and dressing at bed level. Pt reports he can normally feed himself when seated in wheelchair        OT Problem List: Impaired balance (sitting and/or standing);Impaired UE functional use;Decreased cognition;Decreased safety awareness;Pain;Decreased coordination         OT Goals(Current goals can be found in the care plan section) Acute Rehab OT Goals Patient Stated Goal: did not state         AM-PAC OT "6 Clicks" Daily Activity     Outcome Measure Help from another person eating meals?: A Little Help from another person taking care of personal grooming?: A Little Help from another person toileting, which includes using toliet, bedpan, or urinal?: Total Help from another person bathing (including washing, rinsing, drying)?: A Lot Help from another person to put on and taking off regular upper body clothing?: Total Help from another person to put on and taking off regular lower body clothing?: Total 6 Click Score: 11   End of Session    Activity Tolerance: Patient tolerated treatment well Patient left: in bed;with call bell/phone within reach;with bed alarm set  OT Visit Diagnosis: Other abnormalities of gait and mobility (R26.89);Muscle weakness (generalized) (M62.81);Feeding difficulties (R63.3);Pain Pain - part of body:  (abdomen with movement)                Time: 8527-7824 OT Time Calculation (min): 14 min Charges:  OT General Charges $OT Visit: 1 Visit OT Evaluation $OT Eval Moderate Complexity: [place], OTR/L Acute [employer] [facility] [phone] Office [phone]    Almon Register 01/31/2022, 1:58 PM

## 2022-01-31 NOTE — Evaluation (Signed)
Physical Therapy Evaluation Patient Details Name: [name] MRN: [id number] DOB: 1946-06-01 Today's Date: 01/31/2022  History of Present Illness  The pt is a 76 yo male presenting with hematemeis. Pt found to have GI leed and intractable nausea and vomiting. PMH includes: end-stage renal disease on PD, CVA, aflutter, CAD, COPD, PE, hypothyroidism, and AVR.   Clinical Impression  Pt in bed upon arrival of PT, agreeable to evaluation at this time. Prior to admission the pt was mostly bedbound, but reports completing stand-pivot transfers to [facility] with assist of his wife and RW. The pt also reports an aide comes daily to assist with ADLs that are performed in the bed. The pt now presents with limitations in functional mobility, strength, seated stability, activity tolerance, and power due to above dx, and will continue to benefit from skilled PT to address these deficits. The pt required mod cues for sequencing all movements (cues for each extremity) as well as modA to complete transfer to sitting EOB. Once in static sitting position, pt with posterior and R lateral LOB needing up to maxA to reposition at EOB and maintain upright. Pt with no initiation to correct LOB without significant cues and assist. After sitting EOB ~5 min, pt reports worsening dizziness and requests return to bed. Required maxA to scoot laterally along EOB at this time and was unable to progress to standing transfers. Given deficits and significant assist needed for transfers, recommend short term rehab at [facility] or hoyer lift and increased aide assistance if family prefers d/c home. No family present during session.      Recommendations for follow up therapy are one component of a multi-disciplinary discharge planning process, led by the attending physician.  Recommendations may be updated based on patient status, additional functional criteria and insurance authorization.  Follow Up Recommendations Skilled nursing-short term rehab  (<3 hours/day) (unless family able to provide mod/maxA for transfers) Can patient physically be transported by private vehicle: No    Assistance Recommended at Discharge Frequent or constant Supervision/Assistance  Patient can return home with the following  Two people to help with walking and/or transfers;Two people to help with bathing/dressing/bathroom;Direct supervision/assist for medications management;Direct supervision/assist for financial management;Assist for transportation;Help with stairs or ramp for entrance;Assistance with cooking/housework    Equipment Recommendations Other (comment) (hoyer lift)  Recommendations for Other Services       Functional Status Assessment Patient has had a recent decline in their functional status and demonstrates the ability to make significant improvements in function in a reasonable and predictable amount of time.     Precautions / Restrictions Precautions Precautions: Fall Precaution Comments: incontinent of bowel/bladder Restrictions Weight Bearing Restrictions: No      Mobility  Bed Mobility Overal bed mobility: Needs Assistance Bed Mobility: Rolling, Supine to Sit, Sit to Supine Rolling: Min assist, Mod assist   Supine to sit: Mod assist Sit to supine: Mod assist   General bed mobility comments: minA to roll to L side, modA to roll to R. modA to complete movement to sitting EOB with mod cues for each extremity    Transfers Overall transfer level: Needs assistance   Transfers: Bed to chair/wheelchair/BSC            Lateral/Scoot Transfers: Max assist General transfer comment: maxA to laterally scoot along EOB, pt with poor motor planning for use of BLE, needs consistent cues for use of each extremity    Ambulation/Gait  General Gait Details: deferred due to pt inability to stand or consistently follow commands     Balance Overall balance assessment: Needs assistance Sitting-balance support:  Bilateral upper extremity supported, Feet supported Sitting balance-Leahy Scale: Poor Sitting balance - Comments: needing up to maxA to correct LOB, pt unable to initiate correction without assist. Postural control: Posterior lean, Right lateral lean                                   Pertinent Vitals/Pain Pain Assessment Pain Assessment: No/denies pain Pain Intervention(s): Monitored during session    Home Living Family/patient expects to be discharged to:: Private residence Living Arrangements: Spouse/significant other Available Help at Discharge: Family;Personal care attendant (caregiver in/out throughout the day (they live next door); wife works from home) Type of Home: House Home Access: Ramped entrance       Home Layout: One level Home Equipment: Conservation officer, nature (2 wheels);Rollator (4 wheels);Hospital bed;Tub bench;Wheelchair - manual;BSC/3in1;Electric scooter      Prior Function Prior Level of Function : Needs assist             Mobility Comments: pt limited to pivot transfers with RW and assist from wife from bed to [facility] ADLs Comments: aide assists with bathing and dressing     Hand Dominance   Dominant Hand: Right    Extremity/Trunk Assessment   Upper Extremity Assessment Upper Extremity Assessment: Generalized weakness;RUE deficits/detail;LUE deficits/detail RUE Deficits / Details: grossly functional to MMT, limited by poor motor planning and awareness. pt with limited functional use for transfers RUE Sensation: WNL LUE Deficits / Details: grossly functional to MMT, limited by poor motor planning and awareness. pt with limited functional use for transfers    Lower Extremity Assessment Lower Extremity Assessment: RLE deficits/detail;LLE deficits/detail RLE Deficits / Details: limited AROM against gravity, grossly able to move against mod resistance at hip and knee. poor motor coordination/planning for transfers LLE Deficits / Details: limited  AROM against gravity, grossly able to move against mod resistance at hip and knee. poor motor coordination/planning for transfers    Cervical / Trunk Assessment Cervical / Trunk Assessment: Kyphotic  Communication   Communication: Expressive difficulties  Cognition Arousal/Alertness: Awake/alert Behavior During Therapy: Flat affect Overall Cognitive Status: No family/caregiver present to determine baseline cognitive functioning Area of Impairment: Memory, Following commands, Safety/judgement, Problem solving, Awareness                     Memory: Decreased short-term memory Following Commands: Follows one step commands inconsistently, Follows one step commands with increased time Safety/Judgement: Decreased awareness of safety, Decreased awareness of deficits Awareness: Intellectual Problem Solving: Slow processing, Decreased initiation, Difficulty sequencing, Requires tactile cues, Requires verbal cues General Comments: needs repeated cues to maintain activity. cues for sequencing each task. difficult to understand due to expressive difficulties. no family present        General Comments General comments (skin integrity, edema, etc.): pt refused BP measurement due to pain in his arm. other VSS with mobility. pt reports dizziness in sitting    Exercises     Assessment/Plan    PT Assessment Patient needs continued PT services  PT Problem List Decreased strength;Decreased range of motion;Decreased activity tolerance;Decreased balance;Decreased mobility;Decreased coordination;Decreased cognition;Decreased safety awareness       PT Treatment Interventions DME instruction;Gait training;Stair training;Functional mobility training;Therapeutic activities;Therapeutic exercise;Balance training;Patient/family education    PT Goals (Current goals can be found in the  Care Plan section)  Acute Rehab PT Goals Patient Stated Goal: none stated PT Goal Formulation: With patient Time  For Goal Achievement: 02/14/22 Potential to Achieve Goals: Good    Frequency Min 3X/week        AM-PAC PT "6 Clicks" Mobility  Outcome Measure Help needed turning from your back to your side while in a flat bed without using bedrails?: A Lot Help needed moving from lying on your back to sitting on the side of a flat bed without using bedrails?: A Lot Help needed moving to and from a bed to a chair (including a wheelchair)?: Total Help needed standing up from a chair using your arms (e.g., wheelchair or bedside chair)?: Total Help needed to walk in hospital room?: Total Help needed climbing 3-5 steps with a railing? : Total 6 Click Score: 8    End of Session Equipment Utilized During Treatment: Gait belt Activity Tolerance: Patient limited by fatigue (dizziness) Patient left: in bed;with call bell/phone within reach;with bed alarm set (chair position) Nurse Communication: Mobility status PT Visit Diagnosis: Other abnormalities of gait and mobility (R26.89);Muscle weakness (generalized) (M62.81)    Time: 5885-0277 PT Time Calculation (min) (ACUTE ONLY): 28 min   Charges:   PT Evaluation $PT Eval Moderate Complexity: 1 Mod PT Treatments $Therapeutic Activity: 8-22 mins        West Carbo, PT, DPT   Acute Rehabilitation Department  Sandra Cockayne 01/31/2022, 12:36 PM

## 2022-01-31 NOTE — Progress Notes (Signed)
[facility] Progress Note   76 y.o. male HTN, CVA with residual dysarthria + dysphagia, bedbound on pureed diet, AVR, a flutter, CASHD, h/o PE, hypothyroidism, ESRD on PD. His spouse is his caregiver and she states that she had filled the abdomen with 3L 1.5% and then had to stop because of hematemesis x2 with bright red blood. He has had anorexia with nausea and vomiting with associated periumbilical pain in the past few days. She has also noted gargling recently and thought that was from the oropharyngeal dysphagia. He has been treated with Zofran for nausea in the past.   Dialysis Orders:  [facility] 5 cycles over 11 hours 1.5%. 3L fill. Last fill icodextran 2.5L.  Outpatient PD RN Malachy Mood [phone]   Assessment/ Plan:   Hematemesis - Per primary.  ESRD -  Continue CCPD. No signs of peritonitis but will send fluid for analysis; per spouse it is cloudy initially 1st couple drains and then clears up (fibrin?). It's been studied and they haven't been told he had peritonitis -> appears to have peritonitis w/ cell count of 10K with 89% PMN's.  - Started on Cefepime (8/3/ is D1) + Vanco (pharmacy to dose) (has already received 1.5gm Vanco x2) with random level 31.   - Cont 1.5% bags for now; we don't have Icodextran here; therefore, no day dwell here. Still hooked up but treatment is complete; contacted dialysis unit to disconnect and will send for cell count again today.   Will replete K more aggressively today with Klorcon 40 and KCL 62mq IV; very deficient.  Seen on PD; UF net neg 648. Cell count has come down from 10K to 2K  Hypertension/volume  - Blood pressure ok. No gross volume on exam.  Anemia  - Hgb >12 No ESA needs  Metabolic bone disease -  Corr Ca slightly elevated; will check phos tomorrow. No binders for now. Nutrition - Dysphagia diet usually. H/o CVA with dysarthria, some left upper arm weakness, cognitive challenges as well. FTT/Weight loss -  Significant weight loss and progressive weakness noted. Palliative care has been consulted. Guarded prognosis. DNR.   Subjective:   Abd discomfort better; denies nausea or shortness of breath   Objective:   BP 94/65 (BP Location: Left Arm)   Pulse 67   Temp 97.9 F (36.6 C) (Oral)   Resp 18   Ht '5\' 8"'$  (1.727 m)   Wt 88.6 kg   SpO2 95%   BMI 29.70 kg/m   Intake/Output Summary (Last 24 hours) at 01/31/2022 00272Last data filed at 01/31/2022 05366Gross per 24 hour  Intake 15240 ml  Output 15648 ml  Net -408 ml   Weight change:   Physical Exam: General: Alert, oriented to self  Heart: RRR Lungs: Clear bilaterally  Abdomen: soft, non-tender, PD catheter on right side, no erythema  Extremities: No LE edema  Back: no flank tenderness Neuro: tremors and some weakness left arm, dysarthria GU: condom catheter in place Dialysis Access: PD catheter in place + left Cimino (+bruit)  Imaging: No results found.  Labs: BMET Recent Labs  Lab 01/27/22 1957 01/28/22 0443 01/30/22 0638 01/31/22 0300  NA 132* 132* 134* 133*  K 3.1* 3.2* 3.0* 2.8*  CL 94* 96* 100 97*  CO2 '25 25 25 25  '$ GLUCOSE 161* 107* 99 132*  BUN 33* 36* 30* 29*  CREATININE 5.40* 5.49* 4.97* 4.85*  CALCIUM 9.3 9.0 8.5* 8.7*   CBC Recent Labs  Lab 01/27/22 1957 01/27/22 2352  01/28/22 1610 01/29/22 0446 01/29/22 1415 01/30/22 0638  WBC 11.4*  --   --   --   --  9.9  NEUTROABS 9.5*  --   --   --   --   --   HGB 14.1   < > 12.9* 11.9* 12.4* 11.4*  HCT 39.8   < > 36.9* 33.1* 35.1* 33.9*  MCV 94.8  --   --   --   --  98.0  PLT 292  --   --   --   --  208   < > = values in this interval not displayed.    Medications:     finasteride  5 mg Oral Daily   gentamicin cream  1 Application Topical Daily   levothyroxine  175 mcg Oral Q0600   metoprolol tartrate  12.5 mg Oral BID   ondansetron (ZOFRAN) IV  4 mg Intravenous Q6H   pantoprazole (PROTONIX) IV  40 mg Intravenous Q24H   polyethylene glycol  17 g  Oral Daily   potassium chloride  40 mEq Oral Once   sertraline  50 mg Oral Daily   simvastatin  40 mg Oral QPM   sodium chloride flush  3 mL Intravenous Q12H   sucralfate  1 g Oral BID   vancomycin variable dose per unstable renal function (pharmacist dosing)   Does not apply See admin instructions   vortioxetine HBr  20 mg Oral Daily      Otelia Santee, MD 01/31/2022, 7:28 AM

## 2022-01-31 NOTE — Progress Notes (Signed)
PROGRESS NOTE    [name]  MGQ:[id number] DOB: [birth year] DOA: 01/27/2022 PCP: Celene Squibb, MD   Brief Narrative:  76 y/o male with history of ESRD on PD, history of CVA, chronic dysphagia, presented with nausea, vomiting and 2 episodes of hematemesis. Nephrology and GI consulted. Due to multiple medical issues, palliative care also consulted for goals of care.  Assessment & Plan:   Upper GI bleed acute blood loss anemia: -Patient presents to the ER for evaluation of 2 episodes of hematemesis.  Patient transferred to [facility] for GI evaluation.  He was started on Protonix drip, aspirin was placed on hold.  Hemoglobin dropped from 14.1-11.9-12.4.  No further hematemesis episodes during this hospitalization.  Seen by GI, no plans for EGD,They switched him from Protonix drip to daily IV Protonix.GI signed off.   acute peritonitis in the setting of peritoneal dialysis: - Peritoneal fluid analysis suggestive of peritonitis.  Nephrology managing this.  He is improving. -On cefepime and vancomycin per pharmacy  Elevated troponin: -Patient's troponin trended up to 1124.  Cardiology consulted on 8/3 -Recommended patient is not a good candidate for cardiac intervention.  Patient denies any chest pain.  Started on beta-blocker.  Cardiology recommended to decrease metoprolol dose to 12.5 mg daily if BP remains less than 100.  Cardiology signed off.  ESRD (end stage renal disease) on peritoneal dialysis [facility]): Nephrology managing. appreciate help    Failure to thrive in adult -Patient with a history of adult failure to thrive secondary to multiple medical problems.  Palliative on board-appreciate help    History of CVA (cerebrovascular accident) Oropharyngeal dysphagia -Patient has a history of CVA and is currently bedbound and requires assistance with all activities of daily living.  Has some baseline dysarthria as well. -Seen by palliative care during his last hospitalization -Patient  will need frequent turning since he has a stage II decubitus ulcer involving the right buttock to prevent development of new pressure injuries related to his bedbound state   Hypokalemia: replaced   Acquired hypothyroidism: on Synthroid.   Hyperlipidemia: on Zocor.   Paroxysmal Atrial Flutter - This was a new diagnosis for him during his prior admission in May 2023.  And noted on repeat EKG during his last office visit. Recent Zio patch showed his predominant rhythm was atrial flutter with variable conduction and average heart rate in the 50's and no episodes of RVR.  He remains off AV nodal blocking agents for now given his baseline bradycardia and denies any recent palpitations.  - His CHA2DS2-VASc Score is equal to 9.7 % stroke rate/year from a score of 6(HTN, Vascular, Age (2), PE (2)). By review of notes, he was not felt to be an anticoagulation candidate given his prior GI bleeding while on anticoagulation and also not an optimal candidate for Watchman device placement given his co morbidities. He is now having frequent falls and hitting his head which also makes anticoagulation not ideal.    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  Most recent echocardiogram in 08/2021 showed elevated gradients across the valve with AVA at 0.7 cm. Will continue to follow with cardiology as outpatient.   Mitral stenosis - Echocardiogram in 08/2021 showed moderate MS and moderate TR. Will continue to follow with cardiology as outpatient.   Pressure injury of skin -Patient noted to have stage II decubitus ulcer involving the right buttock, POA Wound care consult  Generalized weakness: Patient seen by PT OT, they recommend  SNF.  DVT prophylaxis: SCD Code Status: DNR Family Communication:  None present at bedside.  Plan of care discussed with patient in length and he verbalized understanding and agreed with it. Disposition Plan: SNF.  Patient medically stable  now.  Consultants:  Nephrology GI Palliative care Cardiology  Procedures:  None  Antimicrobials:  Vancomycin Cefepime  Status is: Inpatient     Subjective:  Patient seen and examined.  He is fully alert and oriented.  He has no complaints.  Objective: Vitals:   01/31/22 0800 01/31/22 1200 01/31/22 1201 01/31/22 1253  BP: 109/76   106/70  Pulse: (P) 84 83  73  Resp: '16  17 15  '$ Temp:    98 F (36.7 C)  TempSrc:    Oral  SpO2:   98% 97%  Weight:      Height:        Intake/Output Summary (Last 24 hours) at 01/31/2022 1448 Last data filed at 01/31/2022 1257 Gross per 24 hour  Intake 15120 ml  Output 16348 ml  Net -1228 ml    Filed Weights   01/27/22 1910 01/30/22 1200 01/31/22 0500  Weight: 95 kg 88.8 kg 88.6 kg    Examination:  General exam: Appears calm and comfortable  Respiratory system: Clear to auscultation. Respiratory effort normal. Cardiovascular system: S1 & S2 heard, RRR. No JVD, murmurs, rubs, gallops or clicks. No pedal edema. Gastrointestinal system: Abdomen is nondistended, soft and nontender. No organomegaly or masses felt. Normal bowel sounds heard. Central nervous system: Alert and oriented. No focal neurological deficits. Extremities: Symmetric 5 x 5 power. Skin: No rashes, lesions or ulcers.   Data Reviewed: I have personally reviewed following labs and imaging studies  CBC: Recent Labs  Lab 01/27/22 1957 01/27/22 2352 01/28/22 0747 01/28/22 1610 01/29/22 0446 01/29/22 1415 01/30/22 0638  WBC 11.4*  --   --   --   --   --  9.9  NEUTROABS 9.5*  --   --   --   --   --   --   HGB 14.1   < > 12.7* 12.9* 11.9* 12.4* 11.4*  HCT 39.8   < > 35.7* 36.9* 33.1* 35.1* 33.9*  MCV 94.8  --   --   --   --   --  98.0  PLT 292  --   --   --   --   --  208   < > = values in this interval not displayed.    Basic Metabolic Panel: Recent Labs  Lab 01/27/22 1957 01/28/22 0443 01/30/22 0638 01/31/22 0300  NA 132* 132* 134* 133*  K 3.1*  3.2* 3.0* 2.8*  CL 94* 96* 100 97*  CO2 '25 25 25 25  '$ GLUCOSE 161* 107* 99 132*  BUN 33* 36* 30* 29*  CREATININE 5.40* 5.49* 4.97* 4.85*  CALCIUM 9.3 9.0 8.5* 8.7*  MG  --   --   --  1.3*    GFR: Estimated Creatinine Clearance: 14 mL/min (A) (by C-G formula based on SCr of 4.85 mg/dL (H)). Liver Function Tests: Recent Labs  Lab 01/27/22 1957  AST 22  ALT 15  ALKPHOS 92  BILITOT 0.5  PROT 6.9  ALBUMIN 2.5*    No results for input(s): "LIPASE", "AMYLASE" in the last 168 hours. No results for input(s): "AMMONIA" in the last 168 hours. Coagulation Profile: No results for input(s): "INR", "PROTIME" in the last 168 hours. Cardiac Enzymes: No results for input(s): "CKTOTAL", "CKMB", "CKMBINDEX", "TROPONINI" in the  last 168 hours. BNP (last 3 results) No results for input(s): "PROBNP" in the last 8760 hours. HbA1C: No results for input(s): "HGBA1C" in the last 72 hours. CBG: Recent Labs  Lab 01/28/22 0953  GLUCAP 100*    Lipid Profile: No results for input(s): "CHOL", "HDL", "LDLCALC", "TRIG", "CHOLHDL", "LDLDIRECT" in the last 72 hours. Thyroid Function Tests: No results for input(s): "TSH", "T4TOTAL", "FREET4", "T3FREE", "THYROIDAB" in the last 72 hours. Anemia Panel: No results for input(s): "VITAMINB12", "FOLATE", "FERRITIN", "TIBC", "IRON", "RETICCTPCT" in the last 72 hours. Sepsis Labs: No results for input(s): "PROCALCITON", "LATICACIDVEN" in the last 168 hours.  Recent Results (from the past 240 hour(s))  Body fluid culture w Gram Stain     Status: None (Preliminary result)   Collection Time: 01/28/22  3:36 PM   Specimen: Peritoneal Dialysate; Body Fluid  Result Value Ref Range Status   Specimen Description PERITONEAL DIALYSATE  Final   Special Requests Normal  Final   Gram Stain   Final    CYTOSPIN SMEAR WBC PRESENT,BOTH PMN AND MONONUCLEAR GRAM POSITIVE COCCI IN PAIRS IN SINGLES    Culture   Final    MODERATE STAPHYLOCOCCUS EPIDERMIDIS SUSCEPTIBILITIES  TO FOLLOW Performed at [facility], [place] [address] [place], [place] [address]    Report Status PENDING  Incomplete      Radiology Studies: No results found.  Scheduled Meds:  finasteride  5 mg Oral Daily   gentamicin cream  1 Application Topical Daily   levothyroxine  175 mcg Oral Q0600   metoprolol tartrate  12.5 mg Oral BID   ondansetron (ZOFRAN) IV  4 mg Intravenous Q6H   pantoprazole (PROTONIX) IV  40 mg Intravenous Q24H   polyethylene glycol  17 g Oral Daily   sertraline  50 mg Oral Daily   simvastatin  40 mg Oral QPM   sodium chloride flush  3 mL Intravenous Q12H   sucralfate  1 g Oral BID   vancomycin variable dose per unstable renal function (pharmacist dosing)   Does not apply See admin instructions   vortioxetine HBr  20 mg Oral Daily   Continuous Infusions:  sodium chloride     ceFEPime (MAXIPIME) IV 1 g (01/30/22 0954)   dialysis solution 1.5% low-MG/low-CA     magnesium sulfate bolus IVPB 4 g (01/31/22 1314)     LOS: 3 days    Darliss Cheney, MD [facility]  If 7PM-7AM, please contact night-coverage [email or web address] 01/31/2022, 2:48 PM

## 2022-01-31 NOTE — Progress Notes (Addendum)
Pharmacy Antibiotic Note- follow-up  [name] is a 76 y.o. male admitted on 01/27/2022 with  peritonitis .  Pharmacy has been consulted for vancomycin dosing.  Random level early this AM appropriate at 31 ng/mL. Will plan to measure another random level tomorrow with AM labs and assess eligibility for redosing due to improving Scr. Patient remains afebrile and Scr is slightly improved.   Plan: Measure random level 8/6 @ 0500  No vancomycin dosing planned for today, 8/5  Height: '5\' 8"'$  (172.7 cm) Weight: 88.6 kg (195 lb 5.2 oz) IBW/kg (Calculated) : 68.4  Temp (24hrs), Avg:98 F (36.7 C), Min:97.2 F (36.2 C), Max:98.3 F (36.8 C)  Recent Labs  Lab 01/27/22 1957 01/28/22 0443 01/30/22 0638 01/31/22 0300  WBC 11.4*  --  9.9  --   CREATININE 5.40* 5.49* 4.97* 4.85*  VANCORANDOM  --   --  17 31     Estimated Creatinine Clearance: 14 mL/min (A) (by C-G formula based on SCr of 4.85 mg/dL (H)).    No Known Allergies  Antimicrobials this admission: cefepime 8/3 >>   Vanc 8/3 >>    Microbiology results: 8/2 Peritoneal Dialysate: WBC present, both PMN, mononuclear, GPC in pairs in singles. This sample has been re-incubated for better growth and identification  Thank you for allowing pharmacy to be a part of this patient's care.  Vicenta Dunning, PharmD  PGY1 Pharmacy Resident  --------------------------------------------------------------------------------------------------------- I discussed / reviewed the pharmacy note by Dr. Kara Mead and I agree with the resident's findings and plans as documented. Vaughan Basta BS, PharmD, BCPS Clinical Pharmacist 01/31/2022 10:00 AM  Contact: [phone] after 3 PM  "Be curious, not judgmental..." -Jamal Maes ---------------------------------------------------------------------------------------------------------

## 2022-02-01 DIAGNOSIS — K920 Hematemesis: Secondary | ICD-10-CM | POA: Diagnosis not present

## 2022-02-01 LAB — BODY FLUID CULTURE W GRAM STAIN: Special Requests: NORMAL

## 2022-02-01 LAB — BASIC METABOLIC PANEL
Anion gap: 9 (ref 5–15)
BUN: 23 mg/dL (ref 8–23)
CO2: 26 mmol/L (ref 22–32)
Calcium: 8.7 mg/dL — ABNORMAL LOW (ref 8.9–10.3)
Chloride: 101 mmol/L (ref 98–111)
Creatinine, Ser: 4.23 mg/dL — ABNORMAL HIGH (ref 0.61–1.24)
GFR, Estimated: 14 mL/min — ABNORMAL LOW (ref >60)
Glucose, Bld: 115 mg/dL — ABNORMAL HIGH (ref 70–99)
Potassium: 3.2 mmol/L — ABNORMAL LOW (ref 3.5–5.1)
Sodium: 136 mmol/L (ref 135–145)

## 2022-02-01 LAB — BODY FLUID CELL COUNT WITH DIFFERENTIAL
Eos, Fluid: 0 %
Lymphs, Fluid: 21 %
Monocyte-Macrophage-Serous Fluid: 37 % — ABNORMAL LOW (ref 50–90)
Neutrophil Count, Fluid: 42 % — ABNORMAL HIGH (ref 0–25)
Total Nucleated Cell Count, Fluid: 17 cu mm (ref 0–1000)

## 2022-02-01 LAB — PHOSPHORUS: Phosphorus: 2.2 mg/dL — ABNORMAL LOW (ref 2.5–4.6)

## 2022-02-01 LAB — MAGNESIUM: Magnesium: 2 mg/dL (ref 1.7–2.4)

## 2022-02-01 LAB — GRAM STAIN

## 2022-02-01 LAB — VANCOMYCIN, RANDOM: Vancomycin Rm: 24 ug/mL

## 2022-02-01 MED ORDER — HALOPERIDOL LACTATE 5 MG/ML IJ SOLN: 5.0000 mg | Freq: Four times a day (QID) | INTRAMUSCULAR | Status: DC | PRN

## 2022-02-01 MED ORDER — DELFLEX-LC/1.5% DEXTROSE 344 MOSM/L IP SOLN: Freq: Every day | INTRAPERITONEAL | Status: DC

## 2022-02-01 NOTE — Procedures (Signed)
PD Note Disconnection occurred at approximately 0745.  Patient apparently tolerated treatment well as he was resting with no issues stated when awakened.  He slept through the disconnection process. Staff nurse reports patient was peaceful during the night with no issues with the PD machine. Data concerning the outcome of the treatment in the flow sheets. Ordered lab samples taken to the lab with printed orders. PD access tube secured with a 4x4 gauze over the site dressing.

## 2022-02-01 NOTE — Progress Notes (Signed)
[facility] Progress Note   76 y.o. male HTN, CVA with residual dysarthria + dysphagia, bedbound on pureed diet, AVR, a flutter, CASHD, h/o PE, hypothyroidism, ESRD on PD. His spouse is his caregiver and she states that she had filled the abdomen with 3L 1.5% and then had to stop because of hematemesis x2 with bright red blood. He has had anorexia with nausea and vomiting with associated periumbilical pain in the past few days. She has also noted gargling recently and thought that was from the oropharyngeal dysphagia. He has been treated with Zofran for nausea in the past.   Dialysis Orders:  [facility] 5 cycles over 11 hours 1.5%. 3L fill. Last fill icodextran 2.5L.  Outpatient PD RN Malachy Mood [phone]   Assessment/ Plan:   Hematemesis - Per primary.  ESRD -  Continue CCPD. No signs of peritonitis but will send fluid for analysis; per spouse it is cloudy initially 1st couple drains and then clears up (fibrin?). It's been studied and they haven't been told he had peritonitis -> appears to have peritonitis w/ cell count of 10K with 89% PMN's.  - Started on Cefepime (8/3/ is D1) + Vanco (pharmacy to dose) (has already received 1.5gm Vanco x2) with random level 31.   - Cont 1.5% bags for now; we don't have Icodextran here; therefore, no day dwell here. Still hooked up but treatment is complete; contacted dialysis unit to disconnect and will send for cell count again today.   On 8/5 gave Klorcon 40 and KCL 86mq IV; very deficient -> ordered Bmet stat to determine if more K needs to be given.  Seen on PD; PD already unhooked with last drain. Recordings not in chart yet but he looks euvolemic. Cell count had come down from 10K to 2K after 48hrs of abx. Will check cell count again tomorrow. 1.5% again tonight.  Hypertension/volume  - Blood pressure ok. No gross volume on exam.  Anemia  - Hgb >12 No ESA needs  Metabolic bone disease -  Corr Ca slightly elevated; will  check phos today as well (ordered). No binders for now. Nutrition - Dysphagia diet usually. H/o CVA with dysarthria, some left upper arm weakness, cognitive challenges as well. FTT/Weight loss - Significant weight loss and progressive weakness noted. Palliative care has been consulted. Guarded prognosis. DNR.   Subjective:   Abd discomfort better; denies nausea or shortness of breath   Objective:   BP 120/69 (BP Location: Left Arm)   Pulse (!) 59   Temp 98.2 F (36.8 C) (Oral)   Resp 12   Ht '5\' 8"'$  (1.727 m)   Wt 88.6 kg   SpO2 97%   BMI 29.70 kg/m   Intake/Output Summary (Last 24 hours) at 02/01/2022 0725 Last data filed at 01/31/2022 1257 Gross per 24 hour  Intake --  Output 700 ml  Net -700 ml   Weight change:   Physical Exam: General: Alert, oriented to self  Heart: RRR Lungs: Clear bilaterally  Abdomen: soft, non-tender, PD catheter on right side, no erythema  Extremities: No LE edema  Back: no flank tenderness Neuro: tremors and some weakness left arm, dysarthria GU: condom catheter in place Dialysis Access: PD catheter in place + left Cimino (+bruit)  Imaging: No results found.  Labs: BMET Recent Labs  Lab 01/27/22 1957 01/28/22 0443 01/30/22 0638 01/31/22 0300  NA 132* 132* 134* 133*  K 3.1* 3.2* 3.0* 2.8*  CL 94* 96* 100 97*  CO2 25 25 25  25  GLUCOSE 161* 107* 99 132*  BUN 33* 36* 30* 29*  CREATININE 5.40* 5.49* 4.97* 4.85*  CALCIUM 9.3 9.0 8.5* 8.7*   CBC Recent Labs  Lab 01/27/22 1957 01/27/22 2352 01/28/22 1610 01/29/22 0446 01/29/22 1415 01/30/22 0638  WBC 11.4*  --   --   --   --  9.9  NEUTROABS 9.5*  --   --   --   --   --   HGB 14.1   < > 12.9* 11.9* 12.4* 11.4*  HCT 39.8   < > 36.9* 33.1* 35.1* 33.9*  MCV 94.8  --   --   --   --  98.0  PLT 292  --   --   --   --  208   < > = values in this interval not displayed.    Medications:     finasteride  5 mg Oral Daily   gentamicin cream  1 Application Topical Daily   levothyroxine   175 mcg Oral Q0600   metoprolol tartrate  12.5 mg Oral BID   ondansetron (ZOFRAN) IV  4 mg Intravenous Q6H   pantoprazole (PROTONIX) IV  40 mg Intravenous Q24H   polyethylene glycol  17 g Oral Daily   sertraline  50 mg Oral Daily   simvastatin  40 mg Oral QPM   sodium chloride flush  3 mL Intravenous Q12H   sucralfate  1 g Oral BID   vancomycin variable dose per unstable renal function (pharmacist dosing)   Does not apply See admin instructions   vortioxetine HBr  20 mg Oral Daily      Otelia Santee, MD 02/01/2022, 7:25 AM

## 2022-02-01 NOTE — Progress Notes (Signed)
PROGRESS NOTE    [name]  ZJQ:[id number] DOB: 11-May-1946 DOA: 01/27/2022 PCP: Celene Squibb, MD   Brief Narrative:  76 y/o male with history of ESRD on PD, history of CVA, chronic dysphagia, presented with nausea, vomiting and 2 episodes of hematemesis. Nephrology and GI consulted. Due to multiple medical issues, palliative care also consulted for goals of care.  Assessment & Plan:   Upper GI bleed acute blood loss anemia: -Patient presents to the ER for evaluation of 2 episodes of hematemesis.  Patient transferred to [facility] for GI evaluation.  He was started on Protonix drip, aspirin was placed on hold.  Hemoglobin dropped from 14.1-11.9-12.4.  No further hematemesis episodes during this hospitalization.  Seen by GI, no plans for EGD,They switched him from Protonix drip to daily IV Protonix.GI signed off.   acute peritonitis in the setting of peritoneal dialysis: - Peritoneal fluid analysis suggestive of peritonitis.  Nephrology managing this.  He is improving. -On cefepime and vancomycin per pharmacy  Elevated troponin: -Patient's troponin trended up to 1124.  Cardiology consulted on 8/3 -Recommended patient is not a good candidate for cardiac intervention.  Patient denies any chest pain.  Started on beta-blocker.  Cardiology recommended to decrease metoprolol dose to 12.5 mg daily if BP remains less than 100.  Cardiology signed off.  ESRD (end stage renal disease) on [facility] Nephrology managing. appreciate help    Failure to thrive in adult -Patient with a history of adult failure to thrive secondary to multiple medical problems.  Palliative on board-appreciate help    History of CVA (cerebrovascular accident) Oropharyngeal dysphagia -Patient has a history of CVA and is currently bedbound and requires assistance with all activities of daily living.  Has some baseline dysarthria as well. -Seen by palliative care during his last hospitalization -Patient  will need frequent turning since he has a stage II decubitus ulcer involving the right buttock to prevent development of new pressure injuries related to his bedbound state   Hypokalemia: replaced   Acquired hypothyroidism: on Synthroid.   Hyperlipidemia: on Zocor.   Paroxysmal Atrial Flutter - This was a new diagnosis for him during his prior admission in May 2023.  And noted on repeat EKG during his last office visit. Recent Zio patch showed his predominant rhythm was atrial flutter with variable conduction and average heart rate in the 50's and no episodes of RVR.  He remains off AV nodal blocking agents for now given his baseline bradycardia and denies any recent palpitations.  - His CHA2DS2-VASc Score is equal to 9.7 % stroke rate/year from a score of 6(HTN, Vascular, Age (2), PE (2)). By review of notes, he was not felt to be an anticoagulation candidate given his prior GI bleeding while on anticoagulation and also not an optimal candidate for Watchman device placement given his co morbidities. He is now having frequent falls and hitting his head which also makes anticoagulation not ideal.    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  Most recent echocardiogram in 08/2021 showed elevated gradients across the valve with AVA at 0.7 cm. Will continue to follow with cardiology as outpatient.   Mitral stenosis - Echocardiogram in 08/2021 showed moderate MS and moderate TR. Will continue to follow with cardiology as outpatient.   Pressure injury of skin -Patient noted to have stage II decubitus ulcer involving the right buttock, POA Wound care consult  Generalized weakness: Patient seen by PT OT, they recommend  SNF.  DVT prophylaxis: SCD Code Status: DNR Family Communication:  None present at bedside.  Plan of care discussed with patient .  Disposition Plan: SNF.  Patient medically stable now.  Consultants:  Nephrology GI Palliative  care Cardiology  Procedures:  None  Antimicrobials:  Vancomycin Cefepime  Status is: Inpatient     Subjective:  Patient seen and examined.  He has no complaints.  He is fully alert and oriented.  Objective: Vitals:   02/01/22 0747 02/01/22 0800 02/01/22 0948 02/01/22 1200  BP: 116/71  123/65 110/62  Pulse: 66  66 (!) 109  Resp: 13   18  Temp: 97.7 F (36.5 C) 98.4 F (36.9 C)  97.9 F (36.6 C)  TempSrc: Axillary Oral  Oral  SpO2: 97% 99%  (!) 83%  Weight:      Height:        Intake/Output Summary (Last 24 hours) at 02/01/2022 1235 Last data filed at 01/31/2022 1257 Gross per 24 hour  Intake --  Output 700 ml  Net -700 ml    Filed Weights   01/27/22 1910 01/30/22 1200 01/31/22 0500  Weight: 95 kg 88.8 kg 88.6 kg    Examination:  General exam: Appears calm and comfortable  Respiratory system: Clear to auscultation. Respiratory effort normal. Cardiovascular system: S1 & S2 heard, RRR. No JVD, murmurs, rubs, gallops or clicks. No pedal edema. Gastrointestinal system: Abdomen is nondistended, soft and nontender. No organomegaly or masses felt. Normal bowel sounds heard. Central nervous system: Alert and oriented. No focal neurological deficits. Extremities: Symmetric 5 x 5 power. Skin: No rashes, lesions or ulcers.  Psychiatry: Judgement and insight appear normal. Mood & affect appropriate.    Data Reviewed: I have personally reviewed following labs and imaging studies  CBC: Recent Labs  Lab 01/27/22 1957 01/27/22 2352 01/28/22 0747 01/28/22 1610 01/29/22 0446 01/29/22 1415 01/30/22 0638  WBC 11.4*  --   --   --   --   --  9.9  NEUTROABS 9.5*  --   --   --   --   --   --   HGB 14.1   < > 12.7* 12.9* 11.9* 12.4* 11.4*  HCT 39.8   < > 35.7* 36.9* 33.1* 35.1* 33.9*  MCV 94.8  --   --   --   --   --  98.0  PLT 292  --   --   --   --   --  208   < > = values in this interval not displayed.    Basic Metabolic Panel: Recent Labs  Lab 01/27/22 1957  01/28/22 0443 01/30/22 0638 01/31/22 0300 02/01/22 0646  NA 132* 132* 134* 133* 136  K 3.1* 3.2* 3.0* 2.8* 3.2*  CL 94* 96* 100 97* 101  CO2 '25 25 25 25 26  '$ GLUCOSE 161* 107* 99 132* 115*  BUN 33* 36* 30* 29* 23  CREATININE 5.40* 5.49* 4.97* 4.85* 4.23*  CALCIUM 9.3 9.0 8.5* 8.7* 8.7*  MG  --   --   --  1.3* 2.0  PHOS  --   --   --   --  2.2*    GFR: Estimated Creatinine Clearance: 16.1 mL/min (A) (by C-G formula based on SCr of 4.23 mg/dL (H)). Liver Function Tests: Recent Labs  Lab 01/27/22 1957  AST 22  ALT 15  ALKPHOS 92  BILITOT 0.5  PROT 6.9  ALBUMIN 2.5*    No results for input(s): "LIPASE", "AMYLASE" in the last 168  hours. No results for input(s): "AMMONIA" in the last 168 hours. Coagulation Profile: No results for input(s): "INR", "PROTIME" in the last 168 hours. Cardiac Enzymes: No results for input(s): "CKTOTAL", "CKMB", "CKMBINDEX", "TROPONINI" in the last 168 hours. BNP (last 3 results) No results for input(s): "PROBNP" in the last 8760 hours. HbA1C: No results for input(s): "HGBA1C" in the last 72 hours. CBG: Recent Labs  Lab 01/28/22 0953  GLUCAP 100*    Lipid Profile: No results for input(s): "CHOL", "HDL", "LDLCALC", "TRIG", "CHOLHDL", "LDLDIRECT" in the last 72 hours. Thyroid Function Tests: No results for input(s): "TSH", "T4TOTAL", "FREET4", "T3FREE", "THYROIDAB" in the last 72 hours. Anemia Panel: No results for input(s): "VITAMINB12", "FOLATE", "FERRITIN", "TIBC", "IRON", "RETICCTPCT" in the last 72 hours. Sepsis Labs: No results for input(s): "PROCALCITON", "LATICACIDVEN" in the last 168 hours.  Recent Results (from the past 240 hour(s))  Body fluid culture w Gram Stain     Status: None   Collection Time: 01/28/22  3:36 PM   Specimen: Peritoneal Dialysate; Body Fluid  Result Value Ref Range Status   Specimen Description PERITONEAL DIALYSATE  Final   Special Requests Normal  Final   Gram Stain   Final    CYTOSPIN SMEAR WBC  PRESENT,BOTH PMN AND MONONUCLEAR GRAM POSITIVE COCCI IN PAIRS IN SINGLES Performed at [facility], [place] [address], [place] [address]    Culture MODERATE STAPHYLOCOCCUS EPIDERMIDIS  Final   Report Status 02/01/2022 FINAL  Final   Organism ID, Bacteria STAPHYLOCOCCUS EPIDERMIDIS  Final      Susceptibility   Staphylococcus epidermidis - MIC*    CIPROFLOXACIN <=0.5 SENSITIVE Sensitive     ERYTHROMYCIN <=0.25 SENSITIVE Sensitive     GENTAMICIN <=0.5 SENSITIVE Sensitive     OXACILLIN <=0.25 SENSITIVE Sensitive     TETRACYCLINE <=1 SENSITIVE Sensitive     VANCOMYCIN 1 SENSITIVE Sensitive     TRIMETH/SULFA <=10 SENSITIVE Sensitive     CLINDAMYCIN <=0.25 SENSITIVE Sensitive     RIFAMPIN <=0.5 SENSITIVE Sensitive     Inducible Clindamycin NEGATIVE Sensitive     * MODERATE STAPHYLOCOCCUS EPIDERMIDIS      Radiology Studies: No results found.  Scheduled Meds:  finasteride  5 mg Oral Daily   gentamicin cream  1 Application Topical Daily   levothyroxine  175 mcg Oral Q0600   metoprolol tartrate  12.5 mg Oral BID   ondansetron (ZOFRAN) IV  4 mg Intravenous Q6H   pantoprazole (PROTONIX) IV  40 mg Intravenous Q24H   polyethylene glycol  17 g Oral Daily   sertraline  50 mg Oral Daily   simvastatin  40 mg Oral QPM   sodium chloride flush  3 mL Intravenous Q12H   sucralfate  1 g Oral BID   vancomycin variable dose per unstable renal function (pharmacist dosing)   Does not apply See admin instructions   vortioxetine HBr  20 mg Oral Daily   Continuous Infusions:  sodium chloride     ceFEPime (MAXIPIME) IV 1 g (02/01/22 0948)   dialysis solution 1.5% low-MG/low-CA       LOS: 4 days    Darliss Cheney, MD [facility]  If 7PM-7AM, please contact night-coverage [email or web address] 02/01/2022, 12:35 PM

## 2022-02-01 NOTE — Progress Notes (Addendum)
Pharmacy Antibiotic Note- follow-up  [name] is a 76 y.o. male admitted on 01/27/2022 with  peritonitis .  Pharmacy has been consulted for vancomycin dosing.  Random level early this AM appropriate at 24 mcg/mL, this represents a ~45 hour level from last dose of Vancomycin '1500mg'$  x 1 on 8/4 @ 1000. Patient remains afebrile and Scr is slightly improved again today.   Plan: Hold vancomycin today, 8/6.  Check another random level tomorrow with AM labs.   Height: '5\' 8"'$  (172.7 cm) Weight: 88.6 kg (195 lb 5.2 oz) IBW/kg (Calculated) : 68.4  Temp (24hrs), Avg:98 F (36.7 C), Min:97.7 F (36.5 C), Max:98.5 F (36.9 C)  Recent Labs  Lab 01/27/22 1957 01/28/22 0443 01/30/22 0638 01/30/22 0638 01/31/22 0300 02/01/22 0646  WBC 11.4*  --  9.9  --   --   --   CREATININE 5.40* 5.49* 4.97*  --  4.85*  --   VANCORANDOM  --   --  17   < > 31 24   < > = values in this interval not displayed.     Estimated Creatinine Clearance: 14 mL/min (A) (by C-G formula based on SCr of 4.85 mg/dL (H)).    No Known Allergies  Antimicrobials this admission: cefepime 8/3 >>   Vanc 8/3 >>    Microbiology results: 8/2- Peritoneal Dialysate sent to micro 8/3- Cytospin smear- WBC present, both PMN and mononuclear; GPC in pairs in singles 8/4- This sample was re-incubated for better growth and identification  8/4- cell count of 10K with 89% PMN's 8/5- MODERATE STAPHYLOCOCCUS EPIDERMIDIS / SUSCEPTIBILITIES TO FOLLOW   8/6- Peritoneal Dialysate sent to micro for another cell count  Thank you for allowing pharmacy to be a part of this patient's care.  Vicenta Dunning, PharmD  PGY1 Pharmacy Resident  ------------------------------------------------------------------------------------------------------------------- I discussed / reviewed the pharmacy note by Dr. Kara Mead and I agree with the resident's findings and plans as documented.  Vaughan Basta BS, PharmD, BCPS Clinical Pharmacist 02/01/2022 8:57  AM  Contact: [phone] after 3 PM  "Be curious, not judgmental..." -Jamal Maes --------------------------------------------------------------------------------------------------------------------

## 2022-02-02 DIAGNOSIS — K920 Hematemesis: Secondary | ICD-10-CM | POA: Diagnosis not present

## 2022-02-02 LAB — BASIC METABOLIC PANEL
Anion gap: 8 (ref 5–15)
BUN: 22 mg/dL (ref 8–23)
CO2: 26 mmol/L (ref 22–32)
Calcium: 8.8 mg/dL — ABNORMAL LOW (ref 8.9–10.3)
Chloride: 103 mmol/L (ref 98–111)
Creatinine, Ser: 4.16 mg/dL — ABNORMAL HIGH (ref 0.61–1.24)
GFR, Estimated: 14 mL/min — ABNORMAL LOW (ref >60)
Glucose, Bld: 114 mg/dL — ABNORMAL HIGH (ref 70–99)
Potassium: 3 mmol/L — ABNORMAL LOW (ref 3.5–5.1)
Sodium: 137 mmol/L (ref 135–145)

## 2022-02-02 LAB — VANCOMYCIN, RANDOM: Vancomycin Rm: 21 ug/mL

## 2022-02-02 MED ORDER — SODIUM CHLORIDE 0.9 % IV SOLN: 2.0000 g | INTRAVENOUS | Status: AC

## 2022-02-02 MED ORDER — POTASSIUM CHLORIDE CRYS ER 20 MEQ PO TBCR: 20.0000 meq | EXTENDED_RELEASE_TABLET | Freq: Two times a day (BID) | ORAL | 0 refills | Status: DC

## 2022-02-02 MED ORDER — METOPROLOL TARTRATE 25 MG PO TABS: 12.5000 mg | ORAL_TABLET | Freq: Two times a day (BID) | ORAL | 0 refills | Status: DC

## 2022-02-02 NOTE — TOC Initial Note (Addendum)
Transition of Care [facility]) - Initial/Assessment Note    Patient Details  Name: [name] MRN: [id number] Date of Birth: 10-31-1945  Transition of [facility]) CM/SW Contact:    Benard Halsted, LCSW Phone Number: 02/02/2022, 10:11 AM  Clinical Narrative:                 CSW received consult for possible SNF placement at time of discharge. CSW spoke with patient's spouse. She reported that she does not feel SNF is necessary and feels patient will do well once he returns home and resumes home health services as he was making progress prior to admission. She alternates care-giving shifts with her son and his wife. Patient should be active with [facility]. CSW discussed equipment needs with patient and she requested a hoyer lift and information on a male purewick (she is aware it is likely out of pocket cost). CSW confirmed PCP and address. Patient will require PTAR for transport at discharge.   Expected Discharge Plan: [place] Barriers to Discharge: Continued Medical Work up   Patient Goals and CMS Choice Patient states their goals for this hospitalization and ongoing recovery are:: Return home CMS Medicare.gov Compare Post Acute Care list provided to:: Patient Represenative (must comment) Choice offered to / list presented to : Spouse  Expected Discharge Plan and Services Expected Discharge Plan: [place] In-house Referral: Clinical Social Work Discharge Planning Services: CM Consult Post Acute Care Choice: Resumption of Svcs/PTA Provider Living arrangements for the past 2 months: Single Family Home                                      Prior Living Arrangements/Services Living arrangements for the past 2 months: Single Family Home Lives with:: Spouse Patient language and need for interpreter reviewed:: Yes Do you feel safe going back to the place where you live?: Yes      Need for Family Participation in Patient Care: Yes  (Comment) Care giver support system in place?: Yes (comment) Current home services: Home OT, Home PT Criminal Activity/Legal Involvement Pertinent to Current Situation/Hospitalization: No - Comment as needed  Activities of Daily Living Home Assistive Devices/Equipment: Wheelchair ADL Screening (condition at time of admission) Patient's cognitive ability adequate to safely complete daily activities?: No Is the patient deaf or have difficulty hearing?: No Does the patient have difficulty seeing, even when wearing glasses/contacts?: No Does the patient have difficulty concentrating, remembering, or making decisions?: Yes Patient able to express need for assistance with ADLs?: Yes Does the patient have difficulty dressing or bathing?: Yes Independently performs ADLs?: No Does the patient have difficulty walking or climbing stairs?: Yes Weakness of Legs: Both Weakness of Arms/Hands: Both  Permission Sought/Granted Permission sought to share information with : Facility Sport and exercise psychologist, Family Supports Permission granted to share information with : No  Share Information with NAME: [name]  Permission granted to share info w AGENCY: HH  Permission granted to share info w Relationship: Spouse  Permission granted to share info w Contact Information: [phone]  Emotional Assessment Appearance:: Appears stated age Attitude/Demeanor/Rapport: Unable to Assess Affect (typically observed): Unable to Assess Orientation: : Oriented to Self, Oriented to Place Alcohol / Substance Use: Not Applicable Psych Involvement: No (comment)  Admission diagnosis:  GI bleed [K92.2] Upper GI bleed [K92.2] GI bleeding [K92.2] Patient Active Problem List   Diagnosis Date Noted  GI bleeding 01/28/2022   GI bleed 01/27/2022   ESRD (end stage renal disease) ([place]) 01/04/2022   Malnutrition of moderate degree 01/02/2022   Intractable nausea and vomiting 01/01/2022   Failure to thrive in adult  01/01/2022   Abnormal weight loss 01/01/2022   Dysphagia 01/01/2022   Dehydration 01/01/2022   Lactic acidosis due to dehydration/poor oral intake 01/01/2022   DNR (do not resuscitate) 01/01/2022   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   UTI (urinary tract infection) 12/16/2021   History of CVA (cerebrovascular accident) 12/16/2021   Acute peritonitis ([place]) 12/14/2021   Pressure injury of skin 12/14/2021   Vitamin B12 deficiency (non anemic) 10/06/2021   H/O aortic valve replacement with porcine valve 09/18/2021   Generalized anxiety disorder 11/14/2020   Pulmonary emboli ([place]) 08/21/2020   CHF (congestive heart failure) ([place]) 07/26/2020   ESRD (end stage renal disease) on dialysis ([place]) 07/26/2020   History of thyroid disease 07/26/2020   Acute GI bleeding 07/14/2020   Symptomatic anemia    Hypokalemia 07/10/2020   Endocarditis    Elevated d-dimer    Acute on chronic congestive heart failure ([place])    Demand ischemia (HCC)    Severe sepsis without septic shock (CODE) (HCC)    Enterococcal bacteremia    S/P AVR (aortic valve replacement)    Fever 06/08/2020   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   Essential (primary) hypertension    Chronic obstructive pulmonary disease, unspecified ([place])    Hyperlipidemia, unspecified    Hypothyroidism, unspecified    Atherosclerotic heart disease of native coronary artery without angina pectoris    Gout, unspecified    Major depressive disorder, single episode, unspecified    Morbid (severe) obesity due to excess calories (HCC)    Nonrheumatic aortic (valve) stenosis    Orthostatic hypotension    Hypertension    Thyroid disease    Arthritis    COPD (chronic obstructive pulmonary disease) (HCC)    Heart murmur    Aortic stenosis    CAD (coronary artery disease)    Syncope    Depression    Kidney stones    History of myocardial infarction 10/12/2011   Personal history of cardiac murmur 10/12/2011    Snoring 10/12/2011   Balanitis 09/24/2011   PCP:  Celene Squibb, MD Pharmacy:   [facility] [id number], [place] [address] Phone: [phone] Fax: [phone]     Social Determinants of Health (SDOH) Interventions    Readmission Risk Interventions    12/15/2021    4:03 PM  Readmission Risk Prevention Plan  Transportation Screening Complete  PCP or Specialist Appt within 3-5 Days Complete  HRI or [place] Complete  Social Work Consult for [place] Planning/Counseling Complete  Palliative Care Screening Not Applicable  Medication Review Press photographer) Complete

## 2022-02-02 NOTE — Progress Notes (Signed)
Speech Language Pathology Treatment: Dysphagia  Patient Details Name: [name] MRN: [id number] DOB: February 06, 1946 Today's Date: 02/02/2022 Time: 7591-6384 SLP Time Calculation (min) (ACUTE ONLY): 20 min  Assessment / Plan / Recommendation Clinical Impression  Pt seen at bedside for skilled ST intervention targeting goals for dysphagia. Pt was sleeping, but roused easily with gentle voice and touch. Pt declined breakfast items, but accepted/tolerated trials of honey thick juice via teaspoon and cup. Pt requires assistance with self feeding. RN present to provide meds crushed in puree. Pt appeared to tolerate this as well. No overt s/s aspiration observed. Pt did not eat breakfast this morning. Unsure of PO intake over the weekend. Will continue current diet (dys1/HTL) to maximize swallow safety, given presentation at bedside and history of chronic dysphagia. MBS was completed 12/16/21, with recommendation for puree and nectar thick liquids. Pt now appears to tolerate puree and honey thick liquids better, raising concern for decline in swallow safety. Will discuss with team regarding the risks/benefits of repeating MBS during this hospitalization.    HPI HPI: 76 yo male admitted 01/27/22 with hematemesis. PMH: FTT with significant weight loss, ESRD on peritoneal dialysis, CVA with dysarthria, bedbound, chronic dysphagia on puree diet (likely related to CVA), AVR, PAFlutter, CAD, PE, hypothyroidism, aortic stenosis, COPD, depression, gout, kidney stones, lumbar spondylosis, HLD, orthostatic hypotension, OA. Palliative Care on board.      SLP Plan  Continue with current plan of care      Recommendations for follow up therapy are one component of a multi-disciplinary discharge planning process, led by the attending physician.  Recommendations may be updated based on patient status, additional functional criteria and insurance authorization.    Recommendations  Diet recommendations: Dysphagia 1  (puree);Honey-thick liquid Liquids provided via: Cup;Straw Medication Administration: Crushed with puree Supervision: Full supervision/cueing for compensatory strategies;Trained caregiver to feed patient;Staff to assist with self feeding Compensations: Minimize environmental distractions;Small sips/bites;Slow rate Postural Changes and/or Swallow Maneuvers: Seated upright 90 degrees;Upright 30-60 min after meal                Oral Care Recommendations: Oral care BID Follow Up Recommendations: Follow physician's recommendations for discharge plan and follow up therapies Assistance recommended at discharge: Frequent or constant Supervision/Assistance SLP Visit Diagnosis: Dysphagia, unspecified (R13.10) Plan: Continue with current plan of care          Aveion Nguyen B. Quentin Ore, [facility], [place] Speech Language Pathologist Office: [phone]  Shonna Chock 02/02/2022, 11:18 AM

## 2022-02-02 NOTE — Progress Notes (Signed)
Patient ID: [name], male   DOB: 1945-08-29, 76 y.o.   MRN: [id number]  [facility] Progress Note    Subjective:   "I want to go home".  No events overnight.   Objective:   BP 117/69 (BP Location: Left Arm)   Pulse 67   Temp 98.3 F (36.8 C) (Oral)   Resp 12   Ht '5\' 8"'$  (1.727 m)   Wt 93.8 kg   SpO2 (!) 83%   BMI 31.44 kg/m   Intake/Output: I/O last 3 completed shifts: In: 15000 [Other:15000] Out: 00370 [[id number]]   Intake/Output this shift:  No intake/output data recorded. Weight change:   Physical Exam: Gen: NAD CVS: RRR Resp:CTA Abd: +BS, soft, NT, PD catheter in place, no erythema or drainage.  Ext: trace edema  Labs: BMET Recent Labs  Lab 01/27/22 1957 01/28/22 0443 01/30/22 0638 01/31/22 0300 02/01/22 0646 02/02/22 0825  NA 132* 132* 134* 133* 136 137  K 3.1* 3.2* 3.0* 2.8* 3.2* 3.0*  CL 94* 96* 100 97* 101 103  CO2 '25 25 25 25 26 26  '$ GLUCOSE 161* 107* 99 132* 115* 114*  BUN 33* 36* 30* 29* 23 22  CREATININE 5.40* 5.49* 4.97* 4.85* 4.23* 4.16*  ALBUMIN 2.5*  --   --   --   --   --   CALCIUM 9.3 9.0 8.5* 8.7* 8.7* 8.8*  PHOS  --   --   --   --  2.2*  --    CBC Recent Labs  Lab 01/27/22 1957 01/27/22 2352 01/28/22 1610 01/29/22 0446 01/29/22 1415 01/30/22 0638  WBC 11.4*  --   --   --   --  9.9  NEUTROABS 9.5*  --   --   --   --   --   HGB 14.1   < > 12.9* 11.9* 12.4* 11.4*  HCT 39.8   < > 36.9* 33.1* 35.1* 33.9*  MCV 94.8  --   --   --   --  98.0  PLT 292  --   --   --   --  208   < > = values in this interval not displayed.      Medications:     finasteride  5 mg Oral Daily   gentamicin cream  1 Application Topical Daily   levothyroxine  175 mcg Oral Q0600   metoprolol tartrate  12.5 mg Oral BID   ondansetron (ZOFRAN) IV  4 mg Intravenous Q6H   pantoprazole (PROTONIX) IV  40 mg Intravenous Q24H   polyethylene glycol  17 g Oral Daily   sertraline  50 mg Oral Daily   simvastatin  40 mg Oral QPM   sodium  chloride flush  3 mL Intravenous Q12H   sucralfate  1 g Oral BID   vancomycin variable dose per unstable renal function (pharmacist dosing)   Does not apply See admin instructions   vortioxetine HBr  20 mg Oral Daily   Dialysis Orders:  [facility] 5 cycles over 11 hours 1.5%. 3L fill. Last fill icodextran 2.5L.  Outpatient PD RN Malachy Mood [phone]   Assessment/ Plan:   Hematemesis - improved. Plan per primary svc MSSE peritonitis - improving with antibiotics.  Discussed with his outpatient PD RN, Malachy Mood and they will arrange for outpatient abx with ancef. ESRD continue with home CCPD prescription Anemia:No esa CKD-MBD:stable Nutrition:dysphagia diet Hypokalemia - continue with KCl 40 mEq daily Hypertension: stable Disposition - stable for discharge from renal standpoint.  Donetta Potts, MD [place] 02/02/2022, 11:38 AM

## 2022-02-02 NOTE — Progress Notes (Signed)
Pharmacy Antibiotic Note- follow-up  [name] is a 76 y.o. male admitted on 01/27/2022 with  peritonitis .  Pharmacy has been consulted for vancomycin dosing.  Random level early this AM appropriate at 21 mcg/mL  Plan: Hold vancomycin today, 8/7.  Check another random level tomorrow with AM labs.   Height: '5\' 8"'$  (172.7 cm) Weight: 93.8 kg (206 lb 12.7 oz) IBW/kg (Calculated) : 68.4  Temp (24hrs), Avg:98 F (36.7 C), Min:97.6 F (36.4 C), Max:98.3 F (36.8 C)  Recent Labs  Lab 01/27/22 1957 01/28/22 0443 01/30/22 2595 01/30/22 0638 01/31/22 0300 02/01/22 0646 02/02/22 0825  WBC 11.4*  --  9.9  --   --   --   --   CREATININE 5.40* 5.49* 4.97*  --  4.85* 4.23* 4.16*  VANCORANDOM  --   --  17   < > '31 24 21   '$ < > = values in this interval not displayed.     Estimated Creatinine Clearance: 16.8 mL/min (A) (by C-G formula based on SCr of 4.16 mg/dL (H)).    No Known Allergies  Antimicrobials this admission: cefepime 8/3 >>   Vanc 8/3 >>    Microbiology results: 8/2- Peritoneal Dialysate sent to micro 8/3- Cytospin smear- WBC present, both PMN and mononuclear; GPC in pairs in singles 8/4- This sample was re-incubated for better growth and identification  8/4- cell count of 10K with 89% PMN's 8/5- MODERATE STAPHYLOCOCCUS EPIDERMIDIS / SUSCEPTIBILITIES TO FOLLOW  8/6- pansensitive staph epi  8/6- Peritoneal Dialysate sent to micro for another cell count 8/7- WBC present, predom. PMN's; no organisms; cell count 17 with 42% PMN's  Thank you for allowing pharmacy to be a part of this patient's care.  Vaughan Basta BS, PharmD, BCPS Clinical Pharmacist 02/02/2022 10:21 AM  Contact: [phone] after 3 PM  "Be curious, not judgmental..." -Jamal Maes --------------------------------------------------------------------------------------------------------------------

## 2022-02-02 NOTE — Discharge Summary (Signed)
PatientPhysician Discharge Summary  [name] WUJ:[id number] DOB: 1945-12-17 DOA: 01/27/2022  PCP: Celene Squibb, MD  Admit date: 01/27/2022 Discharge date: 02/02/2022 30 Day Unplanned Readmission Risk Score    Flowsheet Row ED to Hosp-Admission (Current) from 01/27/2022 in [place] [facility]  30 Day Unplanned Readmission Risk Score (%) 29.44 Filed at 02/02/2022 0801       This score is the patient's risk of an unplanned readmission within 30 days of being discharged (0 -100%). The score is based on dignosis, age, lab data, medications, orders, and past utilization.   Low:  0-14.9   Medium: 15-21.9   High: 22-29.9   Extreme: 30 and above          Admitted From: Home Disposition: Home  Recommendations for Outpatient Follow-up:  Follow up with PCP in 1-2 weeks Please obtain BMP/CBC in one week Continue Ancef 2 g IV 3 times weekly for next 2 weeks per nephrology recommendations, nephrology to communicate about this with PD director. Please follow up with your PCP on the following pending results: Unresulted Labs (From admission, onward)     Start     Ordered   02/03/22 0500  Vancomycin, random  Tomorrow morning,   R       Question:  Specimen collection method  Answer:  Lab=Lab collect   02/02/22 1018   02/02/22 0737  Body fluid cell count with differential  Once,   R       Comments: Please obtain from dialysis nurse with last drain tomorrow from tubing   Question:  Are there also cytology or pathology orders on this specimen?  Answer:  No   02/01/22 0737   02/01/22 0756  Pathologist smear review  Once,   R        02/01/22 0756   01/30/22 1059  Pathologist smear review  Once,   R        01/30/22 [place]: Yes Equipment/Devices: Harrel Lemon lift  Discharge Condition: Stable CODE STATUS: DNR Diet recommendation: Renal  Subjective: Patient seen and examined.  He has no complaints.  He is still fully alert and oriented.  Brief/Interim  Summary: 76 y/o male with history of ESRD on PD, history of CVA, chronic dysphagia, presented with nausea, vomiting and 2 episodes of hematemesis. Nephrology and GI consulted. Due to multiple medical issues, palliative care also consulted for goals of care.  He was eventually diagnosed with peritonitis.   Upper GI bleed acute blood loss anemia: -Patient presents to the ER for evaluation of 2 episodes of hematemesis.  Patient transferred to [facility] for GI evaluation.  He was started on Protonix drip, aspirin was placed on hold.  Hemoglobin dropped from 14.1-11.9-12.4.  No further hematemesis episodes during this hospitalization.  Seen by GI, no plans for EGD. GI signed off.   acute peritonitis in the setting of peritoneal dialysis: - Peritoneal fluid analysis suggestive of peritonitis.  Culture grew MSSA.  He was on vancomycin and cefepime.  Nephrology managing.  They have now recommended that patient will need cefepime 2 g 3 times weekly for 2 more weeks and they will coordinate with his peritoneal dialysis center/director.   Elevated troponin: -Patient's troponin trended up to 1124.  Cardiology consulted on 8/3 -Recommended patient is not a good candidate for cardiac intervention.  Patient denies any chest pain.  Started on beta-blocker.  Cardiology recommended to decrease metoprolol dose  to 12.5 mg daily if BP remains less than 100.  Cardiology signed off.   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 appreciate help    Failure to thrive in adult -Patient with a history of adult failure to thrive secondary to multiple medical problems.  Palliative saw patient.   History of CVA (cerebrovascular accident) Oropharyngeal dysphagia -Patient has a history of CVA and is currently bedbound and requires assistance with all activities of daily living.  Has some baseline dysarthria as well. -Seen by palliative care during his last hospitalization -Patient will  need frequent turning since he has a stage II decubitus ulcer involving the right buttock to prevent development of new pressure injuries related to his bedbound state   Hypokalemia: Despite of getting replacement, still low at 3.0.  Nephrology has recommended discharging him on 40 mg mEq of potassium chloride daily, he was already on 20 mg p.o. daily.   Acquired hypothyroidism: on Synthroid.   Hyperlipidemia: on Zocor.   Paroxysmal Atrial Flutter - This was a new diagnosis for him during his prior admission in May 2023.  And noted on repeat EKG during his last office visit. Recent Zio patch showed his predominant rhythm was atrial flutter with variable conduction and average heart rate in the 50's and no episodes of RVR.  He remains off AV nodal blocking agents for now given his baseline bradycardia and denies any recent palpitations.  - His CHA2DS2-VASc Score is equal to 9.7 % stroke rate/year from a score of 6(HTN, Vascular, Age (2), PE (2)). By review of notes, he was not felt to be an anticoagulation candidate given his prior GI bleeding while on anticoagulation and also not an optimal candidate for Watchman device placement given his co morbidities. He is now having frequent falls and hitting his head which also makes anticoagulation not ideal.    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  Most recent echocardiogram in 08/2021 showed elevated gradients across the valve with AVA at 0.7 cm. Will continue to follow with cardiology as outpatient.   Mitral stenosis - Echocardiogram in 08/2021 showed moderate MS and moderate TR. Will continue to follow with cardiology as outpatient.   Pressure injury of skin -Patient noted to have stage II decubitus ulcer involving the right buttock, POA Wound care consult   Generalized weakness: Patient seen by PT OT, they recommend SNF.  However when [facility] spoke to his wife, she told them that she does not want him to go to SNF  and that she is well capable of taking care of him with the help of other family members and she prefers that he comes home.  Due to that, he is going to be discharged home today.  Discharge plan was discussed with patient and/or family member and they verbalized understanding and agreed with it.  Discharge Diagnoses:  Principal Problem:   GI bleed Active Problems:   Dysphagia   Intractable nausea and vomiting   ESRD (end stage renal disease) on dialysis (HCC)   Failure to thrive in adult   History of CVA (cerebrovascular accident)   Hypokalemia   Hypothyroidism, unspecified   Pressure injury of skin   H/O aortic valve replacement with porcine valve   Acute peritonitis ([place])   DNR (do not resuscitate)   Functional quadriplegia ([place])   GI bleeding    Discharge Instructions   Allergies as of 02/02/2022   No Known Allergies  Medication List     TAKE these medications    aspirin EC 81 MG tablet Take 81 mg by mouth daily. Swallow whole.   ceFAZolin 2 g in sodium chloride 0.9 % 100 mL Inject 2 g into the vein 3 (three) times a week for 14 days.   cyanocobalamin 1000 MCG/ML injection Commonly known as: VITAMIN B12 Inject 1,000 mcg into the muscle every 30 (thirty) days.   docusate sodium 100 MG capsule Commonly known as: COLACE Take 200 mg by mouth 2 (two) times daily as needed for mild constipation.   finasteride 5 MG tablet Commonly known as: PROSCAR Take 5 mg by mouth daily.   levothyroxine 175 MCG tablet Commonly known as: SYNTHROID Take 175 mcg by mouth daily before breakfast.   metoprolol tartrate 25 MG tablet Commonly known as: LOPRESSOR Take 0.5 tablets (12.5 mg total) by mouth 2 (two) times daily.   ondansetron 8 MG disintegrating tablet Commonly known as: ZOFRAN-ODT Take 1 tablet (8 mg total) by mouth every 8 (eight) hours as needed for nausea or vomiting.   ondansetron 4 MG disintegrating tablet Commonly known as: ZOFRAN-ODT Take 4 mg by mouth  every 8 (eight) hours as needed.   pantoprazole 40 MG tablet Commonly known as: PROTONIX Take 40 mg by mouth daily.   polyethylene glycol 17 g packet Commonly known as: MIRALAX / GLYCOLAX Take 17 g by mouth daily. What changed:  when to take this reasons to take this   potassium chloride 10 MEQ tablet Commonly known as: KLOR-CON Take 10 mEq by mouth 2 (two) times daily.   [place] Caps 1 MG Caps Take by mouth.   Santyl 250 UNIT/GM ointment Generic drug: collagenase Apply 1 application. topically daily as needed (sore).   sertraline 50 MG tablet Commonly known as: ZOLOFT Take 50 mg by mouth daily.   simvastatin 40 MG tablet Commonly known as: ZOCOR Take 1 tablet (40 mg total) by mouth every evening.   Trintellix 20 MG Tabs tablet Generic drug: vortioxetine HBr Take 20 mg by mouth daily.               Durable Medical Equipment  (From admission, onward)           Start     Ordered   02/02/22 1103  For home use only DME Other see comment  Once       Question:  Length of Need  Answer:  Lifetime   02/02/22 1103            No Known Allergies  Consultations: Nephrology and cardiology   Procedures/Studies: CT ABDOMEN PELVIS WO CONTRAST  Result Date: 01/27/2022 CLINICAL DATA:  Abdominal pain and hematemesis, initial encounter EXAM: CT ABDOMEN AND PELVIS WITHOUT CONTRAST TECHNIQUE: Multidetector CT imaging of the abdomen and pelvis was performed following the standard protocol without IV contrast. RADIATION DOSE REDUCTION: This exam was performed according to the departmental dose-optimization program which includes automated exposure control, adjustment of the mA and/or kV according to patient size and/or use of iterative reconstruction technique. COMPARISON:  01/03/2022 FINDINGS: Lower chest: Emphysematous changes are noted. Chronic scarring is noted in the bases particularly on the left stable from the prior exam. Hepatobiliary: Liver demonstrates minimal  nodularity. No focal mass is seen. The gallbladder is within normal limits. Pancreas: Unremarkable. No pancreatic ductal dilatation or surrounding inflammatory changes. Spleen: Normal in size without focal abnormality. Adrenals/Urinary Tract: Adrenal glands are within normal limits. The kidneys are well visualize without renal calculi or urinary  tract obstructive changes. Vascular calcifications are seen. The bladder is partially distended. Stomach/Bowel: No obstructive or inflammatory changes of the colon are seen. The appendix is well visualized and within normal limits. No inflammatory changes to suggest appendicitis are noted. Small bowel and stomach are unremarkable. Vascular/Lymphatic: Aortic atherosclerosis. No enlarged abdominal or pelvic lymph nodes. Reproductive: Prostate is unremarkable. Other: Considerable free fluid is noted throughout the abdomen consistent with the known history of peritoneal dialysis. The peritoneal dialysis catheter is noted deep in the pelvis. No focal herniation is noted. Musculoskeletal: Degenerative changes of lumbar spine are noted. IMPRESSION: Peritoneal dialysis catheter with evidence of dialysate throughout the abdomen. No acute abnormality is identified correspond with the given clinical history. Electronically Signed   By: Inez Catalina M.D.   On: 01/27/2022 20:35   CT ABDOMEN PELVIS WO CONTRAST  Result Date: 01/03/2022 CLINICAL DATA:  Abdominal pain, intractable nausea and vomiting. EXAM: CT ABDOMEN AND PELVIS WITHOUT CONTRAST TECHNIQUE: Multidetector CT imaging of the abdomen and pelvis was performed following the standard protocol without IV contrast. RADIATION DOSE REDUCTION: This exam was performed according to the departmental dose-optimization program which includes automated exposure control, adjustment of the mA and/or kV according to patient size and/or use of iterative reconstruction technique. COMPARISON:  11/19/2021. FINDINGS: Lower chest: The heart is  enlarged and there is no pericardial effusion. Mild atelectasis is present at the lung bases. Hepatobiliary: No focal liver abnormality is seen. No gallstones, gallbladder wall thickening, or biliary dilatation. Pancreas: Unremarkable. No pancreatic ductal dilatation or surrounding inflammatory changes. Spleen: Normal in size without focal abnormality. Adrenals/Urinary Tract: The adrenal glands are within normal limits. No renal calculus or hydronephrosis. The bladder is unremarkable. Stomach/Bowel: There is a small hiatal hernia. The stomach is otherwise within normal limits. No free air or pneumatosis. Scattered diverticula are present along the colon without evidence of diverticulitis. Appendix appears normal. No evidence of bowel wall thickening, distention, or inflammatory changes. Vascular/Lymphatic: Aortic atherosclerosis. 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 No enlarged abdominal or pelvic lymph nodes. Reproductive: Prostate is unremarkable. Other: A peritoneal dialysis catheter is noted. A trace amount of free fluid is noted in the perihepatic space. A fat containing umbilical hernia is noted. Fat containing inguinal hernias are noted bilaterally. Musculoskeletal: Sternotomy wires are present over the midline. Degenerative changes are present in the thoracolumbar spine. No acute osseous abnormality. IMPRESSION: 1. No acute intra-abdominal process. 2. Small hiatal hernia. 3. Trace amount of perihepatic free fluid with peritoneal dialysis catheter in place. 4. Cardiomegaly with coronary artery calcifications. 5. Aortic atherosclerosis. Electronically Signed   By: Brett Fairy M.D.   On: 01/03/2022 20:28     Discharge Exam: Vitals:   02/02/22 0000 02/02/22 0425  BP: 103/67 117/69  Pulse: 69 67  Resp: 18 12  Temp: 98.1 F (36.7 C) 98.3 F (36.8 C)  SpO2:     Vitals:   02/01/22 1957 02/02/22 0000 02/02/22 0425 02/02/22 0504  BP: 94/71 103/67 117/69   Pulse: 76 69 67    Resp: '12 18 12   '$ Temp: 98 F (36.7 C) 98.1 F (36.7 C) 98.3 F (36.8 C)   TempSrc: Oral Oral Oral   SpO2:      Weight:    93.8 kg  Height:        General: Pt is alert, awake, not in acute distress Cardiovascular: RRR, S1/S2 +, no rubs, no gallops Respiratory: CTA bilaterally, no wheezing, no rhonchi Abdominal: Soft, NT, ND, bowel sounds + Extremities:  no edema, no cyanosis    The results of significant diagnostics from this hospitalization (including imaging, microbiology, ancillary and laboratory) are listed below for reference.     Microbiology: Recent Results (from the past 240 hour(s))  Body fluid culture w Gram Stain     Status: None   Collection Time: 01/28/22  3:36 PM   Specimen: Peritoneal Dialysate; Body Fluid  Result Value Ref Range Status   Specimen Description PERITONEAL DIALYSATE  Final   Special Requests Normal  Final   Gram Stain   Final    CYTOSPIN SMEAR WBC PRESENT,BOTH PMN AND MONONUCLEAR GRAM POSITIVE COCCI IN PAIRS IN SINGLES Performed at [facility], [place] [address] [place], [place] [address]    Culture MODERATE STAPHYLOCOCCUS EPIDERMIDIS  Final   Report Status 02/01/2022 FINAL  Final   Organism ID, Bacteria STAPHYLOCOCCUS EPIDERMIDIS  Final      Susceptibility   Staphylococcus epidermidis - MIC*    CIPROFLOXACIN <=0.5 SENSITIVE Sensitive     ERYTHROMYCIN <=0.25 SENSITIVE Sensitive     GENTAMICIN <=0.5 SENSITIVE Sensitive     OXACILLIN <=0.25 SENSITIVE Sensitive     TETRACYCLINE <=1 SENSITIVE Sensitive     VANCOMYCIN 1 SENSITIVE Sensitive     TRIMETH/SULFA <=10 SENSITIVE Sensitive     CLINDAMYCIN <=0.25 SENSITIVE Sensitive     RIFAMPIN <=0.5 SENSITIVE Sensitive     Inducible Clindamycin NEGATIVE Sensitive     * MODERATE STAPHYLOCOCCUS EPIDERMIDIS  Culture, body fluid w Gram Stain-bottle     Status: None (Preliminary result)   Collection Time: 02/01/22  7:56 AM   Specimen: Peritoneal Washings  Result Value Ref Range Status    Specimen Description PERITONEAL  Final   Special Requests NONE  Final   Culture   Final    NO GROWTH < 24 HOURS Performed at [facility], [place] [address] [place], [place] [address]    Report Status PENDING  Incomplete  Gram stain     Status: None   Collection Time: 02/01/22  7:56 AM   Specimen: Peritoneal Washings  Result Value Ref Range Status   Specimen Description PERITONEAL  Final   Special Requests NONE  Final   Gram Stain   Final    CYTOSPIN SMEAR WBC PRESENT, PREDOMINANTLY PMN NO ORGANISMS SEEN Performed at [facility], [place] [address], [place],  [address]    Report Status 02/01/2022 FINAL  Final     Labs: BNP (last 3 results) No results for input(s): "BNP" in the last 8760 hours. Basic Metabolic Panel: Recent Labs  Lab 01/28/22 0443 01/30/22 0638 01/31/22 0300 02/01/22 0646 02/02/22 0825  NA 132* 134* 133* 136 137  K 3.2* 3.0* 2.8* 3.2* 3.0*  CL 96* 100 97* 101 103  CO2 '25 25 25 26 26  '$ GLUCOSE 107* 99 132* 115* 114*  BUN 36* 30* 29* 23 22  CREATININE 5.49* 4.97* 4.85* 4.23* 4.16*  CALCIUM 9.0 8.5* 8.7* 8.7* 8.8*  MG  --   --  1.3* 2.0  --   PHOS  --   --   --  2.2*  --    Liver Function Tests: Recent Labs  Lab 01/27/22 1957  AST 22  ALT 15  ALKPHOS 92  BILITOT 0.5  PROT 6.9  ALBUMIN 2.5*   No results for input(s): "LIPASE", "AMYLASE" in the last 168 hours. No results for input(s): "AMMONIA" in the last 168 hours. CBC: Recent Labs  Lab 01/27/22 1957 01/27/22 2352 01/28/22 0747 01/28/22  1610 01/29/22 0446 01/29/22 1415 01/30/22 0638  WBC 11.4*  --   --   --   --   --  9.9  NEUTROABS 9.5*  --   --   --   --   --   --   HGB 14.1   < > 12.7* 12.9* 11.9* 12.4* 11.4*  HCT 39.8   < > 35.7* 36.9* 33.1* 35.1* 33.9*  MCV 94.8  --   --   --   --   --  98.0  PLT 292  --   --   --   --   --  208   < > = values in this interval not displayed.   Cardiac Enzymes: No results for input(s): "CKTOTAL", "CKMB", "CKMBINDEX", "TROPONINI"  in the last 168 hours. BNP: Invalid input(s): "POCBNP" CBG: Recent Labs  Lab 01/28/22 0953  GLUCAP 100*   D-Dimer No results for input(s): "DDIMER" in the last 72 hours. Hgb A1c No results for input(s): "HGBA1C" in the last 72 hours. Lipid Profile No results for input(s): "CHOL", "HDL", "LDLCALC", "TRIG", "CHOLHDL", "LDLDIRECT" in the last 72 hours. Thyroid function studies No results for input(s): "TSH", "T4TOTAL", "T3FREE", "THYROIDAB" in the last 72 hours.  Invalid input(s): "FREET3" Anemia work up No results for input(s): "VITAMINB12", "FOLATE", "FERRITIN", "TIBC", "IRON", "RETICCTPCT" in the last 72 hours. Urinalysis    Component Value Date/Time   COLORURINE AMBER (A) 12/14/2021 0433   APPEARANCEUR CLOUDY (A) 12/14/2021 0433   LABSPEC 1.030 12/14/2021 0433   PHURINE 5.0 12/14/2021 0433   GLUCOSEU 50 (A) 12/14/2021 0433   HGBUR LARGE (A) 12/14/2021 0433   BILIRUBINUR NEGATIVE 12/14/2021 [place] 12/14/2021 0433   PROTEINUR 100 (A) 12/14/2021 0433   UROBILINOGEN 0.2 07/11/2012 0545   NITRITE NEGATIVE 12/14/2021 0433   LEUKOCYTESUR LARGE (A) 12/14/2021 0433   Sepsis Labs Recent Labs  Lab 01/27/22 1957 01/30/22 0638  WBC 11.4* 9.9   Microbiology Recent Results (from the past 240 hour(s))  Body fluid culture w Gram Stain     Status: None   Collection Time: 01/28/22  3:36 PM   Specimen: Peritoneal Dialysate; Body Fluid  Result Value Ref Range Status   Specimen Description PERITONEAL DIALYSATE  Final   Special Requests Normal  Final   Gram Stain   Final    CYTOSPIN SMEAR WBC PRESENT,BOTH PMN AND MONONUCLEAR GRAM POSITIVE COCCI IN PAIRS IN SINGLES Performed at [facility], [place] [address], [place], [place] [address]    Culture MODERATE STAPHYLOCOCCUS EPIDERMIDIS  Final   Report Status 02/01/2022 FINAL  Final   Organism ID, Bacteria STAPHYLOCOCCUS EPIDERMIDIS  Final      Susceptibility   Staphylococcus epidermidis - MIC*     CIPROFLOXACIN <=0.5 SENSITIVE Sensitive     ERYTHROMYCIN <=0.25 SENSITIVE Sensitive     GENTAMICIN <=0.5 SENSITIVE Sensitive     OXACILLIN <=0.25 SENSITIVE Sensitive     TETRACYCLINE <=1 SENSITIVE Sensitive     VANCOMYCIN 1 SENSITIVE Sensitive     TRIMETH/SULFA <=10 SENSITIVE Sensitive     CLINDAMYCIN <=0.25 SENSITIVE Sensitive     RIFAMPIN <=0.5 SENSITIVE Sensitive     Inducible Clindamycin NEGATIVE Sensitive     * MODERATE STAPHYLOCOCCUS EPIDERMIDIS  Culture, body fluid w Gram Stain-bottle     Status: None (Preliminary result)   Collection Time: 02/01/22  7:56 AM   Specimen: Peritoneal Washings  Result Value Ref Range Status   Specimen Description PERITONEAL  Final   Special Requests  NONE  Final   Culture   Final    NO GROWTH < 24 HOURS Performed at [facility], [place] [address], [place], [place] [address]    Report Status PENDING  Incomplete  Gram stain     Status: None   Collection Time: 02/01/22  7:56 AM   Specimen: Peritoneal Washings  Result Value Ref Range Status   Specimen Description PERITONEAL  Final   Special Requests NONE  Final   Gram Stain   Final    CYTOSPIN SMEAR WBC PRESENT, PREDOMINANTLY PMN NO ORGANISMS SEEN Performed at [facility], [place] [address], [place], [place] [address]    Report Status 02/01/2022 FINAL  Final     Time coordinating discharge: Over 30 minutes  SIGNED:   Darliss Cheney, MD  [facility] 02/02/2022, 11:19 AM *Please note that this is a verbal dictation therefore any spelling or grammatical errors are due to the "[place] One" system interpretation. If 7PM-7AM, please contact night-coverage [email or web address]

## 2022-02-03 LAB — PATHOLOGIST SMEAR REVIEW

## 2022-02-04 NOTE — Progress Notes (Signed)
8/9 5:07pm-CSW received notification from RN that patient's spouse is requesting a call back regarding how patient is supposed to received his IV antibiotic.   RNCM gone for the day so CSW contacted her and notified her that per the DC Summary from 02/02/22, the nephrologist had communicated with the PD Rn, Malachy Mood, to make her aware that he should be receiving the antibiotic with his dialysis. Wife stated the DC summary instructions state to inject it into the vein which they cannot do. CSW requested she call Malachy Mood to see what she advises as she has spoken with the nephrologist.   5:15pm-Patient's spouse returned call and stated per Malachy Mood they can only provide the IV in the fluid bag which will treat his peritonitis but not his UTI (E.Coli) and so he would need something else as well. CSW will attempt to reach Dr. Marval Regal for recommendations and requested spouse call CSW tomorrow for an update. Otherwise patient will need to follow up with outpatient providers for clarification.   Gilmore Laroche, MSW, [facility]

## 2022-02-05 NOTE — TOC Transition Note (Signed)
Transition of Care [facility]) - CM/SW Discharge Note   Patient Details  Name: [name] MRN: [id number] Date of Birth: 04-19-1946  Transition of Care [facility]) CM/SW Contact:  Cyndi Bender, RN Phone Number: 02/05/2022, 8:46 AM   Clinical Narrative:     Patient stable for discharge. Spoke to [place] with [facility] and resumption orders placed. Wife requested hoyer lift. Agreeable to use in house provider adapt. Spoke to [employer] and ordered hoyer lift to be delivered to the house. Spoke to wife, [name], who is requesting bedside nurse call her once Corey Harold has arrived to hospital. Clarified with Dr. Marval Regal that the home antibiotic will be given by the home health PD RN.   PTAR has been called. Final next level of care: Home w Home Health Services[facility] Barriers to Discharge: Barriers Resolved   Patient Goals and CMS Choice Patient states their goals for this hospitalization and ongoing recovery are:: Return home CMS Medicare.gov Compare Post Acute Care list provided to:: Patient Represenative (must comment) Choice offered to / list presented to : Spouse  Discharge Placement            home          Discharge Plan and Services In-house Referral: Clinical Social Work Discharge Planning Services: CM Consult Post Acute Care Choice: Resumption of Svcs/PTA Provider                               Social Determinants of Health (SDOH) Interventions     Readmission Risk Interventions    12/15/2021    4:03 PM  Readmission Risk Prevention Plan  Transportation Screening Complete  PCP or Specialist Appt within 3-5 Days Complete  HRI or [place] Complete  Social Work Consult for [place] Planning/Counseling Complete  Palliative Care Screening Not Applicable  Medication Review [employer]) Complete

## 2022-02-06 LAB — CULTURE, BODY FLUID W GRAM STAIN -BOTTLE: Culture: NO GROWTH

## 2022-03-17 ENCOUNTER — Inpatient Hospital Stay
Admission: EM | Admit: 2022-03-17 | Discharge: 2022-03-19 | DRG: 871 | Disposition: A | Discharge status: Hospice | Payer: Managed Care, Other (non HMO) | Source: Ambulatory Visit | Attending: Internal Medicine | Admitting: Internal Medicine

## 2022-03-17 DIAGNOSIS — I4892 Unspecified atrial flutter: Secondary | ICD-10-CM | POA: Diagnosis present

## 2022-03-17 DIAGNOSIS — R6520 Severe sepsis without septic shock: Secondary | ICD-10-CM | POA: Diagnosis present

## 2022-03-17 DIAGNOSIS — Z6831 Body mass index (BMI) 31.0-31.9, adult: Secondary | ICD-10-CM

## 2022-03-17 DIAGNOSIS — J449 Chronic obstructive pulmonary disease, unspecified: Secondary | ICD-10-CM | POA: Diagnosis present

## 2022-03-17 DIAGNOSIS — Z87891 Personal history of nicotine dependence: Secondary | ICD-10-CM

## 2022-03-17 DIAGNOSIS — Z79899 Other long term (current) drug therapy: Secondary | ICD-10-CM

## 2022-03-17 DIAGNOSIS — Z7982 Long term (current) use of aspirin: Secondary | ICD-10-CM

## 2022-03-17 DIAGNOSIS — E782 Mixed hyperlipidemia: Secondary | ICD-10-CM | POA: Diagnosis present

## 2022-03-17 DIAGNOSIS — A419 Sepsis, unspecified organism: Secondary | ICD-10-CM | POA: Diagnosis not present

## 2022-03-17 DIAGNOSIS — Z515 Encounter for palliative care: Secondary | ICD-10-CM | POA: Diagnosis not present

## 2022-03-17 DIAGNOSIS — M109 Gout, unspecified: Secondary | ICD-10-CM | POA: Diagnosis present

## 2022-03-17 DIAGNOSIS — N390 Urinary tract infection, site not specified: Secondary | ICD-10-CM | POA: Diagnosis present

## 2022-03-17 DIAGNOSIS — I2510 Atherosclerotic heart disease of native coronary artery without angina pectoris: Secondary | ICD-10-CM | POA: Diagnosis present

## 2022-03-17 DIAGNOSIS — E039 Hypothyroidism, unspecified: Secondary | ICD-10-CM | POA: Diagnosis present

## 2022-03-17 DIAGNOSIS — Z87442 Personal history of urinary calculi: Secondary | ICD-10-CM

## 2022-03-17 DIAGNOSIS — F32A Depression, unspecified: Secondary | ICD-10-CM | POA: Diagnosis present

## 2022-03-17 DIAGNOSIS — I120 Hypertensive chronic kidney disease with stage 5 chronic kidney disease or end stage renal disease: Secondary | ICD-10-CM | POA: Diagnosis present

## 2022-03-17 DIAGNOSIS — E878 Other disorders of electrolyte and fluid balance, not elsewhere classified: Secondary | ICD-10-CM | POA: Diagnosis present

## 2022-03-17 DIAGNOSIS — Z992 Dependence on renal dialysis: Secondary | ICD-10-CM | POA: Diagnosis not present

## 2022-03-17 DIAGNOSIS — Z953 Presence of xenogenic heart valve: Secondary | ICD-10-CM | POA: Diagnosis not present

## 2022-03-17 DIAGNOSIS — I10 Essential (primary) hypertension: Secondary | ICD-10-CM | POA: Diagnosis present

## 2022-03-17 DIAGNOSIS — N2581 Secondary hyperparathyroidism of renal origin: Secondary | ICD-10-CM | POA: Diagnosis present

## 2022-03-17 DIAGNOSIS — K652 Spontaneous bacterial peritonitis: Secondary | ICD-10-CM | POA: Diagnosis present

## 2022-03-17 DIAGNOSIS — D631 Anemia in chronic kidney disease: Secondary | ICD-10-CM | POA: Diagnosis present

## 2022-03-17 DIAGNOSIS — I4430 Unspecified atrioventricular block: Secondary | ICD-10-CM | POA: Diagnosis present

## 2022-03-17 DIAGNOSIS — N186 End stage renal disease: Secondary | ICD-10-CM | POA: Diagnosis present

## 2022-03-17 DIAGNOSIS — E669 Obesity, unspecified: Secondary | ICD-10-CM | POA: Diagnosis present

## 2022-03-17 DIAGNOSIS — Z66 Do not resuscitate: Secondary | ICD-10-CM | POA: Diagnosis present

## 2022-03-17 DIAGNOSIS — E876 Hypokalemia: Secondary | ICD-10-CM | POA: Diagnosis present

## 2022-03-17 DIAGNOSIS — Z79890 Hormone replacement therapy (postmenopausal): Secondary | ICD-10-CM

## 2022-03-17 DIAGNOSIS — Z8249 Family history of ischemic heart disease and other diseases of the circulatory system: Secondary | ICD-10-CM

## 2022-03-17 DIAGNOSIS — Z8673 Personal history of transient ischemic attack (TIA), and cerebral infarction without residual deficits: Secondary | ICD-10-CM

## 2022-03-17 DIAGNOSIS — A411 Sepsis due to other specified staphylococcus: Secondary | ICD-10-CM | POA: Diagnosis not present

## 2022-03-17 DIAGNOSIS — E871 Hypo-osmolality and hyponatremia: Secondary | ICD-10-CM | POA: Diagnosis present

## 2022-03-17 DIAGNOSIS — E785 Hyperlipidemia, unspecified: Secondary | ICD-10-CM

## 2022-03-17 DIAGNOSIS — K659 Peritonitis, unspecified: Principal | ICD-10-CM

## 2022-03-17 DIAGNOSIS — Z86711 Personal history of pulmonary embolism: Secondary | ICD-10-CM

## 2022-03-17 LAB — COMPREHENSIVE METABOLIC PANEL
ALT: 9 U/L (ref 0–44)
AST: 29 U/L (ref 15–41)
Albumin: 2.2 g/dL — ABNORMAL LOW (ref 3.5–5.0)
Alkaline Phosphatase: 115 U/L (ref 38–126)
Anion gap: 12 (ref 5–15)
BUN: 25 mg/dL — ABNORMAL HIGH (ref 8–23)
CO2: 29 mmol/L (ref 22–32)
Calcium: 8.9 mg/dL (ref 8.9–10.3)
Chloride: 93 mmol/L — ABNORMAL LOW (ref 98–111)
Creatinine, Ser: 4.09 mg/dL — ABNORMAL HIGH (ref 0.61–1.24)
GFR, Estimated: 14 mL/min — ABNORMAL LOW (ref >60)
Glucose, Bld: 105 mg/dL — ABNORMAL HIGH (ref 70–99)
Potassium: 3.3 mmol/L — ABNORMAL LOW (ref 3.5–5.1)
Sodium: 134 mmol/L — ABNORMAL LOW (ref 135–145)
Total Bilirubin: 1.1 mg/dL (ref 0.3–1.2)
Total Protein: 6.2 g/dL — ABNORMAL LOW (ref 6.5–8.1)

## 2022-03-17 LAB — CBC
HCT: 38.7 % — ABNORMAL LOW (ref 39.0–52.0)
Hemoglobin: 13 g/dL (ref 13.0–17.0)
MCH: 32 pg (ref 26.0–34.0)
MCHC: 33.6 g/dL (ref 30.0–36.0)
MCV: 95.3 fL (ref 80.0–100.0)
Platelets: 184 10*3/uL (ref 150–400)
RBC: 4.06 MIL/uL — ABNORMAL LOW (ref 4.22–5.81)
RDW: 13.8 % (ref 11.5–15.5)
WBC: 12.8 10*3/uL — ABNORMAL HIGH (ref 4.0–10.5)
nRBC: 0 % (ref 0.0–0.2)

## 2022-03-17 LAB — URINALYSIS, ROUTINE W REFLEX MICROSCOPIC
Bilirubin Urine: NEGATIVE
Glucose, UA: >=500 mg/dL — AB
Hgb urine dipstick: NEGATIVE
Ketones, ur: NEGATIVE mg/dL
Nitrite: NEGATIVE
Protein, ur: 30 mg/dL — AB
Specific Gravity, Urine: >1.046 — ABNORMAL HIGH (ref 1.005–1.030)
pH: 5 (ref 5.0–8.0)

## 2022-03-17 LAB — BODY FLUID CELL COUNT WITH DIFFERENTIAL
Eos, Fluid: 0 %
Lymphs, Fluid: 37 %
Monocyte-Macrophage-Serous Fluid: 2 %
Neutrophil Count, Fluid: 61 %
Total Nucleated Cell Count, Fluid: 2701 cu mm

## 2022-03-17 LAB — LACTIC ACID, PLASMA: Lactic Acid, Venous: 2 mmol/L (ref 0.5–1.9)

## 2022-03-17 LAB — MAGNESIUM: Magnesium: 1.2 mg/dL — ABNORMAL LOW (ref 1.7–2.4)

## 2022-03-17 LAB — LIPASE, BLOOD: Lipase: 41 U/L (ref 11–51)

## 2022-03-17 MED ORDER — SODIUM CHLORIDE 0.9 % IV SOLN
1.0000 g | INTRAVENOUS | Status: DC
  Administered 2022-03-18: 1 g via INTRAVENOUS
  Filled 2022-03-17: qty 10
  Filled 2022-03-17: qty 1
  Filled 2022-03-17: qty 10

## 2022-03-17 MED ORDER — ONDANSETRON 4 MG PO TBDP: 4.0000 mg | ORAL_TABLET | Freq: Three times a day (TID) | ORAL | Status: DC | PRN

## 2022-03-17 MED ORDER — METRONIDAZOLE 500 MG/100ML IV SOLN
500.0000 mg | Freq: Two times a day (BID) | INTRAVENOUS | Status: DC
  Administered 2022-03-17 – 2022-03-19 (×4): 500 mg via INTRAVENOUS
  Filled 2022-03-17 (×5): qty 100

## 2022-03-17 MED ORDER — POTASSIUM CHLORIDE CRYS ER 20 MEQ PO TBCR
20.0000 meq | EXTENDED_RELEASE_TABLET | Freq: Two times a day (BID) | ORAL | Status: DC
  Administered 2022-03-18 (×2): 20 meq via ORAL
  Filled 2022-03-17 (×2): qty 1

## 2022-03-17 MED ORDER — POLYETHYLENE GLYCOL 3350 17 G PO PACK
17.0000 g | PACK | Freq: Every day | ORAL | Status: DC | PRN
Start: 2022-03-17 — End: 2022-03-19

## 2022-03-17 MED ORDER — PANTOPRAZOLE SODIUM 40 MG PO TBEC
40.0000 mg | DELAYED_RELEASE_TABLET | Freq: Every day | ORAL | Status: DC
  Administered 2022-03-18: 40 mg via ORAL
  Filled 2022-03-17 (×2): qty 1

## 2022-03-17 MED ORDER — DOCUSATE SODIUM 100 MG PO CAPS: 200.0000 mg | ORAL_CAPSULE | Freq: Two times a day (BID) | ORAL | Status: DC | PRN

## 2022-03-17 MED ORDER — ONDANSETRON HCL 4 MG/2ML IJ SOLN: 4.0000 mg | Freq: Four times a day (QID) | INTRAMUSCULAR | Status: DC | PRN

## 2022-03-17 MED ORDER — SIMVASTATIN 20 MG PO TABS
40.0000 mg | ORAL_TABLET | Freq: Every evening | ORAL | Status: DC
  Administered 2022-03-18: 40 mg via ORAL
  Filled 2022-03-17: qty 2

## 2022-03-17 MED ORDER — ACETAMINOPHEN 325 MG PO TABS: 650.0000 mg | ORAL_TABLET | Freq: Four times a day (QID) | ORAL | Status: DC | PRN

## 2022-03-17 MED ORDER — SODIUM CHLORIDE 0.9 % IV SOLN: 2.0000 g | Freq: Once | INTRAVENOUS | Status: DC

## 2022-03-17 MED ORDER — ASPIRIN 81 MG PO TBEC
81.0000 mg | DELAYED_RELEASE_TABLET | Freq: Every day | ORAL | Status: DC
Start: 2022-03-17 — End: 2022-03-19
  Administered 2022-03-18: 81 mg via ORAL
  Filled 2022-03-17 (×2): qty 1

## 2022-03-17 MED ORDER — VANCOMYCIN HCL 2000 MG/400ML IV SOLN
2000.0000 mg | Freq: Once | INTRAVENOUS | Status: AC
  Administered 2022-03-17: 2000 mg via INTRAVENOUS
  Filled 2022-03-17: qty 400

## 2022-03-17 MED ORDER — CYANOCOBALAMIN 1000 MCG/ML IJ SOLN
1000.0000 ug | INTRAMUSCULAR | Status: DC
  Administered 2022-03-18: 1000 ug via INTRAMUSCULAR
  Filled 2022-03-17: qty 1

## 2022-03-17 MED ORDER — METOPROLOL TARTRATE 25 MG PO TABS
12.5000 mg | ORAL_TABLET | Freq: Two times a day (BID) | ORAL | Status: DC
  Administered 2022-03-18: 12.5 mg via ORAL
  Filled 2022-03-17 (×2): qty 1

## 2022-03-17 MED ORDER — TRAZODONE HCL 50 MG PO TABS: 25.0000 mg | ORAL_TABLET | Freq: Every evening | ORAL | Status: DC | PRN

## 2022-03-17 MED ORDER — FINASTERIDE 5 MG PO TABS
5.0000 mg | ORAL_TABLET | Freq: Every day | ORAL | Status: DC
  Administered 2022-03-18 – 2022-03-19 (×2): 5 mg via ORAL
  Filled 2022-03-17 (×2): qty 1

## 2022-03-17 MED ORDER — POTASSIUM CHLORIDE 20 MEQ PO PACK
40.0000 meq | PACK | Freq: Once | ORAL | Status: AC
  Administered 2022-03-18: 40 meq via ORAL
  Filled 2022-03-17: qty 2

## 2022-03-17 MED ORDER — ENOXAPARIN SODIUM 40 MG/0.4ML IJ SOSY: 40.0000 mg | PREFILLED_SYRINGE | INTRAMUSCULAR | Status: DC

## 2022-03-17 MED ORDER — ACETAMINOPHEN 650 MG RE SUPP: 650.0000 mg | Freq: Four times a day (QID) | RECTAL | Status: DC | PRN

## 2022-03-17 MED ORDER — MEDIHONEY WOUND/BURN DRESSING EX PSTE
1.0000 | PASTE | Freq: Every day | CUTANEOUS | Status: DC
  Filled 2022-03-17: qty 44

## 2022-03-17 MED ORDER — LEVOTHYROXINE SODIUM 100 MCG PO TABS
175.0000 ug | ORAL_TABLET | Freq: Every day | ORAL | Status: DC
  Administered 2022-03-19: 175 ug via ORAL
  Filled 2022-03-17: qty 1

## 2022-03-17 MED ORDER — RENA-VITE PO TABS
1.0000 | ORAL_TABLET | Freq: Every day | ORAL | Status: DC
Start: 2022-03-18 — End: 2022-03-19
  Administered 2022-03-18: 1 via ORAL
  Filled 2022-03-17 (×2): qty 1

## 2022-03-17 MED ORDER — VANCOMYCIN VARIABLE DOSE PER UNSTABLE RENAL FUNCTION (PHARMACIST DOSING): Status: DC

## 2022-03-17 MED ORDER — VANCOMYCIN HCL IN DEXTROSE 1-5 GM/200ML-% IV SOLN: 1000.0000 mg | Freq: Once | INTRAVENOUS | Status: DC

## 2022-03-17 MED ORDER — SERTRALINE HCL 50 MG PO TABS
50.0000 mg | ORAL_TABLET | Freq: Every day | ORAL | Status: DC
  Administered 2022-03-18 – 2022-03-19 (×2): 50 mg via ORAL
  Filled 2022-03-17 (×2): qty 1

## 2022-03-17 MED ORDER — VORTIOXETINE HBR 5 MG PO TABS
20.0000 mg | ORAL_TABLET | Freq: Every day | ORAL | Status: DC
  Administered 2022-03-18 – 2022-03-19 (×2): 20 mg via ORAL
  Filled 2022-03-17 (×2): qty 4

## 2022-03-17 MED ORDER — MAGNESIUM HYDROXIDE 400 MG/5ML PO SUSP: 30.0000 mL | Freq: Every day | ORAL | Status: DC | PRN

## 2022-03-17 MED ORDER — ONDANSETRON HCL 4 MG PO TABS: 4.0000 mg | ORAL_TABLET | Freq: Four times a day (QID) | ORAL | Status: DC | PRN

## 2022-03-17 NOTE — Assessment & Plan Note (Signed)
-   We will continue his antidepressants.

## 2022-03-17 NOTE — ED Triage Notes (Signed)
Per family , dialysis is showing cloudy  and patient has peritonitis

## 2022-03-17 NOTE — Assessment & Plan Note (Signed)
-   We will continue statin therapy. 

## 2022-03-17 NOTE — Assessment & Plan Note (Signed)
-   Potassium will be replaced and magnesium level will be checked. ?

## 2022-03-17 NOTE — Assessment & Plan Note (Signed)
-   Nephrology consult will be obtained as mentioned above. - Dr. Holley Raring is aware about the patient.

## 2022-03-17 NOTE — Assessment & Plan Note (Signed)
-   We will continue Synthroid. 

## 2022-03-17 NOTE — H&P (Addendum)
[place]   PATIENT NAME: [name]    MR#:  [id number]  DATE OF BIRTH:  [birth year]  DATE OF ADMISSION:  03/17/2022  PRIMARY CARE PHYSICIAN: Celene Squibb, MD   Patient is coming from: Home  REQUESTING/REFERRING PHYSICIAN: Delman Kitten, MD  CHIEF COMPLAINT:   Chief Complaint  Patient presents with   Abdominal Pain    Peritoneal dialysis patient     HISTORY OF PRESENT ILLNESS:  [name] is a 76 y.o. male with medical history significant for end-stage renal disease on peritoneal dialysis, recurrent peritonitis, gout, hypertension, hypothyroidism, dyslipidemia, PE, kidney artery disease, COPD and aortic stenosis, presented to the emergency room with acute onset of cloudy peritoneal dialysis fluid today with associated abdominal discomfort.  He had previous 3 episodes of cloudy fluid associated with infectious peritonitis.  When he called Dr. Holley Raring he advised him to come to the emergency room to be admitted for IV antibiotic therapy.  He currently denies any nausea or vomiting or abdominal pain.  No fever or chills.  No chest pain or dyspnea or wheezing.  He has been having occasional cough though no dysuria, urinary frequency or urgency or hematuria or flank pain.  ED Course: When he came to the ER, vital signs showed heart rate of 100 and was vital signs where within normal.  Later respiratory rate was briefly elevated at 34 and came down to 18.  Blood pressure also was down to 92/61 and later 121/54 labs revealed mild hypokalemia of 3.3 and hyponatremia and hypochloremia with a BUN of 25 and creatinine 4.09.  Albumin was 2.2 with total protein of 6.2.  Lactic acid was 2 and CBC showed leukocytosis 12.8.  UA was positive for UTI and had high specific gravity 1046 with glucose more than 511-20 WBCs.  Peritoneal fluid analysis currently pending. EKG as reviewed by me : EKG showed atrial flutter with variable AV block with a rate of 91 with LVH, QRS widening and deep Q waves  inferiorly. Imaging: None.  The patient will be placed on IV vancomycin, cefepime and Flagyl will be started after withdrawal of his peritoneal fluid via his dialysis catheter by his dialysis nurse.  Dr. Holley Raring was notified about the patient.  The patient will be admitted to medical telemetry bed for further evaluation and management. PAST MEDICAL HISTORY:   Past Medical History:  Diagnosis Date   Aortic stenosis    Status post AVR, 25 mm Edwards pericardial Magna-Ease valve 2014   Arthritis    CAD (coronary artery disease)    Status post SVG to OM 2014   COPD (chronic obstructive pulmonary disease) ([place])    Depression    Dialysis patient ([place])    on PD 7 night weekly at home   ESRD on peritoneal dialysis [facility])    Essential hypertension    Gout    Hemorrhoid    Hypertension    Hypothyroidism    Kidney stones    Lumbar spondylolysis    Mixed hyperlipidemia    Orthostatic hypotension    Osteoarthritis    Pulmonary emboli ([place]) 08/21/2020   Syncope    Vitamin D deficiency     PAST SURGICAL HISTORY:   Past Surgical History:  Procedure Laterality Date   ANGIOPASTY     AORTIC VALVE REPLACEMENT  07/11/2012   Procedure: AORTIC VALVE REPLACEMENT (AVR);  Surgeon: Gaye Pollack, MD;  Location: [place];  Service: Open Heart Surgery;  Laterality: N/A;  AV FISTULA PLACEMENT Left 05/30/2020   Procedure: ARTERIOVENOUS (AV) FISTULA CREATION LEFT;  Surgeon: [name], MD;  Location: [facility];  Service: Vascular;  Laterality: Left;   CARDIAC CATHETERIZATION     CAROTID ENDARTERECTOMY Left    CATARACT EXTRACTION W/PHACO Right 06/09/2021   Procedure: CATARACT EXTRACTION RIGHT EYE W/ PHACO AND INTRAOCULAR LENS PLACEMENT ([place]);  Surgeon: Baruch Goldmann, MD;  Location: [facility];  Service: Ophthalmology;  Laterality: Right;  CDE: 12.97   CATARACT EXTRACTION W/PHACO Left 06/27/2021   Procedure: CATARACT EXTRACTION PHACO AND INTRAOCULAR LENS PLACEMENT ([place]) WITH IACCESS GONIOTOMY;  Surgeon:  Baruch Goldmann, MD;  Location: [facility];  Service: Ophthalmology;  Laterality: Left;  CDE: 10.08   CIRCUMCISION     COLONOSCOPY WITH PROPOFOL N/A 07/16/2020   Procedure: COLONOSCOPY WITH PROPOFOL;  Surgeon: Doran Stabler, MD;  Location: [facility];  Service: Gastroenterology;  Laterality: N/A;   CORONARY ARTERY BYPASS GRAFT  07/11/2012   Procedure: CORONARY ARTERY BYPASS GRAFTING (CABG);  Surgeon: Gaye Pollack, MD;  Location: [facility];  Service: Open Heart Surgery;  Laterality: N/A;  CABG x one,  using right leg greater saphenous vein harvested endoscopically   ESOPHAGOGASTRODUODENOSCOPY (EGD) WITH PROPOFOL N/A 07/15/2020   Procedure: ESOPHAGOGASTRODUODENOSCOPY (EGD) WITH PROPOFOL;  Surgeon: Doran Stabler, MD;  Location: [facility];  Service: Gastroenterology;  Laterality: N/A;   HOT HEMOSTASIS N/A 07/16/2020   Procedure: HOT HEMOSTASIS (ARGON PLASMA COAGULATION/BICAP);  Surgeon: Doran Stabler, MD;  Location: [facility];  Service: Gastroenterology;  Laterality: N/A;   INSERTION OF ANTERIOR SEGMENT AQUEOUS DRAINAGE DEVICE ([place]) Right 06/09/2021   Procedure: INSERTION OF RIGHT EYE ANTERIOR SEGMENT AQUEOUS DRAINAGE DEVICE (IST[place]);  Surgeon: Baruch Goldmann, MD;  [facility]: [facility];  Service: Ophthalmology;  Laterality: Right;  CDE: 12.97   INTRAOPERATIVE TRANSESOPHAGEAL ECHOCARDIOGRAM  07/11/2012   Procedure: INTRAOPERATIVE TRANSESOPHAGEAL ECHOCARDIOGRAM;  Surgeon: Gaye Pollack, MD;  Location: [facility];  Service: Open Heart Surgery;  Laterality: N/A;   IR PERC TUN PERIT CATH WO PORT S&I /IMAG  06/13/2020   IR REMOVAL TUN CV CATH W/O FL  07/26/2020   IR US GUIDE VASC ACCESS RIGHT  06/13/2020   JOINT REPLACEMENT Bilateral    knees   TEE WITHOUT CARDIOVERSION N/A 06/12/2020   Procedure: TRANSESOPHAGEAL ECHOCARDIOGRAM (TEE);  Surgeon: Skeet Latch, MD;  Location[facility] ENDOSCOPY;  Service: Cardiovascular;  Laterality: N/A;    SOCIAL HISTORY:   Social History   Tobacco Use    Smoking status: Former    Packs/day: 3.00    Years: 50.00    Total pack years: 150.00    Types: Cigarettes    Quit date: 12/13/2010    Years since quitting: 11.2   Smokeless tobacco: Former   Tobacco comments:    Electronic cigarette...USES INFREQUENTLY NOW  Substance Use Topics   Alcohol use: Not Currently    Comment: Rare    FAMILY HISTORY:   Family History  Problem Relation Age of Onset   Heart disease Mother    Heart disease Father    Hypertension Father    Colon cancer Neg Hx    Esophageal cancer Neg Hx    Stomach cancer Neg Hx     DRUG ALLERGIES:  No Known Allergies  REVIEW OF SYSTEMS:   ROS As per history of present illness. All pertinent systems were reviewed above. Constitutional, HEENT, cardiovascular, respiratory, GI, GU, musculoskeletal, neuro, psychiatric, endocrine, integumentary and hematologic systems were reviewed and are otherwise negative/unremarkable except for positive findings  mentioned above in the HPI.   MEDICATIONS AT HOME:   Prior to Admission medications   Medication Sig Start Date End Date Taking? Authorizing Provider  aspirin EC 81 MG tablet Take 81 mg by mouth daily. Swallow whole.    [provider]  B Complex-C-Folic Acid ([place] CAPS) 1 MG CAPS Take by mouth.    [provider]  collagenase (SANTYL) 250 UNIT/GM ointment Apply 1 application. topically daily as needed (sore).    [provider]  cyanocobalamin (,VITAMIN B-12,) 1000 MCG/ML injection Inject 1,000 mcg into the muscle every 30 (thirty) days.    [provider]  docusate sodium (COLACE) 100 MG capsule Take 200 mg by mouth 2 (two) times daily as needed for mild constipation.    [provider]  finasteride (PROSCAR) 5 MG tablet Take 5 mg by mouth daily.    [provider]  levothyroxine (SYNTHROID) 175 MCG tablet Take 175 mcg by mouth daily before breakfast.  05/12/12   [provider]  metoprolol tartrate (LOPRESSOR)  25 MG tablet Take 0.5 tablets (12.5 mg total) by mouth 2 (two) times daily. 02/02/22 03/04/22  Darliss Cheney, MD  ondansetron (ZOFRAN-ODT) 4 MG disintegrating tablet Take 4 mg by mouth every 8 (eight) hours as needed. 01/26/22   [provider]  ondansetron (ZOFRAN-ODT) 8 MG disintegrating tablet Take 1 tablet (8 mg total) by mouth every 8 (eight) hours as needed for nausea or vomiting. 01/04/22   Bonnielee Haff, MD  pantoprazole (PROTONIX) 40 MG tablet Take 40 mg by mouth daily.    [provider]  polyethylene glycol (MIRALAX / GLYCOLAX) 17 g packet Take 17 g by mouth daily. Patient taking differently: Take 17 g by mouth daily as needed for mild constipation or moderate constipation. 06/14/20   Regalado, Belkys A, MD  potassium chloride SA (KLOR-CON M) 20 MEQ tablet Take 1 tablet (20 mEq total) by mouth 2 (two) times daily. 02/02/22 03/04/22  Darliss Cheney, MD  sertraline (ZOLOFT) 50 MG tablet Take 50 mg by mouth daily.    [provider]  simvastatin (ZOCOR) 40 MG tablet Take 1 tablet (40 mg total) by mouth every evening. 11/07/21   Strader, Fransisco Hertz, PA-C  TRINTELLIX 20 MG TABS tablet Take 20 mg by mouth daily. 03/19/21   [provider]      VITAL SIGNS:  Blood pressure 92/61, pulse 100, temperature 98.9 F (37.2 C), temperature source Oral, resp. rate 17, height '5\' 8"'$  (1.727 m), weight 93 kg, SpO2 92 %.  PHYSICAL EXAMINATION:  Physical Exam  GENERAL:  76 y.o.-year-old male patient lying in the bed with no acute distress.  EYES: Pupils equal, round, reactive to light and accommodation. No scleral icterus. Extraocular muscles intact.  HEENT: Head atraumatic, normocephalic. Oropharynx and nasopharynx clear.  NECK:  Supple, no jugular venous distention. No thyroid enlargement, no tenderness.  LUNGS: Normal breath sounds bilaterally, no wheezing, rales,rhonchi or crepitation. No use of accessory muscles of respiration.  CARDIOVASCULAR: Regular rate and rhythm, S1,  S2 normal. No murmurs, rubs, or gallops.  ABDOMEN: Soft, nondistended, with mild left mid abdominal tenderness at the site of his intact PD catheter.  No rebound tenderness, guarding or rigidity.  Bowel sounds present. No organomegaly or mass.  EXTREMITIES: No pedal edema, cyanosis, or clubbing.  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 PSYCHIATRIC: The patient is alert and oriented x 3.  Normal affect and good eye contact. SKIN: No obvious rash, lesion, or ulcer.  LABORATORY PANEL:   CBC Recent Labs  Lab 03/17/22 1731  WBC 12.8*  HGB 13.0  HCT 38.7*  PLT 184   ------------------------------------------------------------------------------------------------------------------  Chemistries  Recent Labs  Lab 03/17/22 1731  NA 134*  K 3.3*  CL 93*  CO2 29  GLUCOSE 105*  BUN 25*  CREATININE 4.09*  CALCIUM 8.9  AST 29  ALT 9  ALKPHOS 115  BILITOT 1.1   ------------------------------------------------------------------------------------------------------------------  Cardiac Enzymes No results for input(s): "TROPONINI" in the last 168 hours. ------------------------------------------------------------------------------------------------------------------  RADIOLOGY:  No results found.    IMPRESSION AND PLAN:  Assessment and Plan: * SBP (spontaneous bacterial peritonitis) ([place]) - The will be admitted to a medical telemetry bed. - We will continue antibiotic therapy with IV vancomycin cefepime and Flagyl. - Pain management will be provided. - We will follow peritoneal dialysis fluid analysis. - Nephrology consult will be obtained. - Dr. Holley Raring is aware about the patient.  Sepsis due to undetermined organism [facility]) - This is clearly secondary to her SBP and possibly UTI. - The patient will be placed on IV vancomycin cefepime and Flagyl as mentioned above. - Sepsis manifested by leukocytosis, heart rate of 100 and respiratory rate of  34. - He had brief hypotension that can qualify him for severe sepsis.  This has resolved. - We will follow blood and peritoneal fluid cultures.  Acute lower UTI - This should be covered with above-mentioned antibiotics. - We will follow urine culture and sensitive.  Hypokalemia - Potassium will be replaced and magnesium level will be checked.  ESRD (end stage renal disease) on dialysis [facility]) - Nephrology consult will be obtained as mentioned above. - Dr. Holley Raring is aware about the patient.  Hypothyroidism, unspecified - We will continue Synthroid.  Dyslipidemia - We will continue statin therapy.  Depression - We will continue his antidepressants.  Hypertension - We will continue his antihypertensives.   DVT prophylaxis: 7/10 heparin. Advanced Care Planning:  Code Status: DNR/DNI. Family Communication:  The plan of care was discussed in details with the patient (and family). I answered all questions. The patient agreed to proceed with the above mentioned plan. Further management will depend upon hospital course. Disposition Plan: Back to previous home environment Consults called: Nephrology. All the records are reviewed and case discussed with ED provider.  Status is: Inpatient   At the time of the admission, it appears that the appropriate admission status for this patient is inpatient.  This is judged to be reasonable and necessary in order to provide the required intensity of service to ensure the patient's safety given the presenting symptoms, physical exam findings and initial radiographic and laboratory data in the context of comorbid conditions.  The patient requires inpatient status due to high intensity of service, high risk of further deterioration and high frequency of surveillance required.  I certify that at the time of admission, it is my clinical judgment that the patient will require inpatient hospital care extending more than 2 midnights.                             Dispo: The patient is from: Home              Anticipated d/c is to: Home              Patient currently is not medically stable to d/c.              Difficult to place  patient: No  Christel Mormon M.D on 03/17/2022 at 9:07 PM  [facility]   From 7 PM-7 AM, contact night-coverage [email or web address]  CC: Primary care physician; Celene Squibb, MD

## 2022-03-17 NOTE — ED Provider Notes (Signed)
[facility] Provider Note    Event Date/Time   First MD Initiated Contact with Patient 03/17/22 Bosie Helper     (approximate)   History   Abdominal Pain (Peritoneal dialysis patient )  EM caveat, patient with some difficulty in daily communication due to history of multiple prior strokes.  His wife is at the bedside and provides the majority of the history.  HPI  [name] is a 76 y.o. male with a history of end-stage renal disease on peritoneal dialysis, history of recurrent episodes of peritonitis   His wife noticed that his peritoneal dialysis fluid looked cloudy today.  He has had 3 prior episodes just like this really noticed cloudy fluid and he has had infectious peritonitis.  They called nephrology office and nephrologist recommended he come to the hospital to be admitted  Patient has not complained of any pain no nausea no vomiting no fevers.  However, during his previous issues with peritoneal fluid infection his wife reports that he has had very minimal symptoms as well.  Physical Exam   Triage Vital Signs: ED Triage Vitals  Enc Vitals Group     BP 03/17/22 1731 92/61     Pulse Rate 03/17/22 1727 100     Resp 03/17/22 1727 17     Temp 03/17/22 1727 98.9 F (37.2 C)     Temp Source 03/17/22 1727 Oral     SpO2 03/17/22 1727 92 %     Weight 03/17/22 1729 205 lb 0.4 oz (93 kg)     Height 03/17/22 1729 '5\' 8"'$  (1.727 m)     Head Circumference --      Peak Flow --      Pain Score 03/17/22 1728 0     Pain Loc --      Pain Edu? --      Excl. in [place]? --     Most recent vital signs: Vitals:   03/17/22 2100 03/17/22 2130  BP: 99/72 99/71  Pulse: 67 64  Resp:  (!) 23  Temp:    SpO2: 96% 96%     General: Awake, no distress.  Has some difficulty speaking seems to have a slight expressive aphasia, wife reports that is normal after multiple strokes that he has had.  He is acting and behaving very normally to his baseline CV:  Good peripheral  perfusion.  Occasional irregularity.  Appears on rhythm strip to have atrial flutter with normal rate Resp:  Normal effort.  Clear bilateral. Abd:  No distention.  Peritoneal dialysis catheter site is clean dry and intact surrounding.  Patient does not note any pain denies pain to palpation any quadrant. Other:     ED Results / Procedures / Treatments   Labs (all labs ordered are listed, but only abnormal results are displayed) Labs Reviewed  COMPREHENSIVE METABOLIC PANEL - Abnormal; Notable for the following components:      Result Value   Sodium 134 (*)    Potassium 3.3 (*)    Chloride 93 (*)    Glucose, Bld 105 (*)    BUN 25 (*)    Creatinine, Ser 4.09 (*)    Total Protein 6.2 (*)    Albumin 2.2 (*)    GFR, Estimated 14 (*)    All other components within normal limits  CBC - Abnormal; Notable for the following components:   WBC 12.8 (*)    RBC 4.06 (*)    HCT 38.7 (*)    All other  components within normal limits  URINALYSIS, ROUTINE W REFLEX MICROSCOPIC - Abnormal; Notable for the following components:   Color, Urine AMBER (*)    APPearance CLOUDY (*)    Specific Gravity, Urine >1.046 (*)    Glucose, UA >=500 (*)    Protein, ur 30 (*)    Leukocytes,Ua SMALL (*)    Bacteria, UA RARE (*)    All other components within normal limits  LACTIC ACID, PLASMA - Abnormal; Notable for the following components:   Lactic Acid, Venous 2.0 (*)    All other components within normal limits  BODY FLUID CULTURE W GRAM STAIN  LIPASE, BLOOD  LACTIC ACID, PLASMA  BODY FLUID CELL COUNT WITH DIFFERENTIAL  PROCALCITONIN  BASIC METABOLIC PANEL  CBC  PROTIME-INR  CORTISOL-AM, BLOOD   Labs notable for minimally elevated lactic acid.  Leukocytosis is present.  Creatinine 4, known peritoneal dialysis patient.  EKG  Interpreted by me at 1735 heart rate 90 QRS 120 QTc 480 Atrial flutter variable block.  Underlying LVH.  No evidence of acute  ischemia   RADIOLOGY     PROCEDURES:  Critical Care performed: No  Procedures   MEDICATIONS ORDERED IN ED: Medications  aspirin EC tablet 81 mg (has no administration in time range)  metoprolol tartrate (LOPRESSOR) tablet 12.5 mg (has no administration in time range)  simvastatin (ZOCOR) tablet 40 mg (has no administration in time range)  sertraline (ZOLOFT) tablet 50 mg (has no administration in time range)  vortioxetine HBr (TRINTELLIX) tablet 20 mg (has no administration in time range)  levothyroxine (SYNTHROID) tablet 175 mcg (has no administration in time range)  docusate sodium (COLACE) capsule 200 mg (has no administration in time range)  pantoprazole (PROTONIX) EC tablet 40 mg (has no administration in time range)  polyethylene glycol (MIRALAX / GLYCOLAX) packet 17 g (has no administration in time range)  finasteride (PROSCAR) tablet 5 mg (has no administration in time range)  cyanocobalamin (VITAMIN B12) injection 1,000 mcg (has no administration in time range)  multivitamin (RENA-VIT) tablet 1 tablet (has no administration in time range)  potassium chloride SA (KLOR-CON M) CR tablet 20 mEq (has no administration in time range)  leptospermum manuka honey (MEDIHONEY) paste 1 Application (has no administration in time range)  enoxaparin (LOVENOX) injection 40 mg (has no administration in time range)  metroNIDAZOLE (FLAGYL) IVPB 500 mg (500 mg Intravenous New Bag/Given 03/17/22 2053)  acetaminophen (TYLENOL) tablet 650 mg (has no administration in time range)    Or  acetaminophen (TYLENOL) suppository 650 mg (has no administration in time range)  traZODone (DESYREL) tablet 25 mg (has no administration in time range)  ondansetron (ZOFRAN) tablet 4 mg (has no administration in time range)    Or  ondansetron (ZOFRAN) injection 4 mg (has no administration in time range)  vancomycin (VANCOREADY) IVPB 2000 mg/400 mL (has no administration in time range)  ceFEPIme (MAXIPIME) 1  g in sodium chloride 0.9 % 100 mL IVPB ( Intravenous Restarted 03/17/22 2053)  vancomycin variable dose per unstable renal function (pharmacist dosing) (has no administration in time range)     IMPRESSION / MDM / ASSESSMENT AND PLAN / ED COURSE  I reviewed the triage vital signs and the nursing notes.                              Differential diagnosis includes, but is not limited to, bacterial peritonitis, infectious etiology urinary tract infection, anemia, hemodialysis or  peritoneal dialysis catheter malfunction etc.  Based on the clinical history provided though it seems most likely the patient is redeveloped peritonitis.  We will empirically treat for this as we await peritoneal culture result and return of the patient's cell count from peritoneal fluid  Nephrology advising patient should be admitted for further work-up, agreeable with this plan  ----------------------------------------- 9:50 PM on 03/17/2022 ----------------------------------------- Awaiting body culture and differential result.  Antibiotics have been given as recommended by nephrology Dr. Zollie Scale who I have consulted with.  Patient admitted to the hospitalist service after consultation with Dr. Sidney Ace  Patient and wife both understanding agreeable with plan for admission for concerns of infection including possible urinary tract infection but also a high concern for redevelopment of spontaneous bacterial peritonitis  Patient's presentation is most consistent with acute presentation with potential threat to life or bodily function.  The patient is on the cardiac monitor to evaluate for evidence of arrhythmia and/or significant heart rate changes.    Labs notable for chronic renal disease and a known peritoneal dialysis patient.  Leukocytosis.  Minimally elevated lactic acid  FINAL CLINICAL IMPRESSION(S) / ED DIAGNOSES   Final diagnoses:  Bacterial peritonitis ([place])     Rx / DC Orders   ED Discharge Orders      None        Note:  This document was prepared using Dragon voice recognition software and may include unintentional dictation errors.   Delman Kitten, MD 03/17/22 2152

## 2022-03-17 NOTE — Assessment & Plan Note (Addendum)
-   This is clearly secondary to her SBP and possibly UTI. - The patient will be placed on IV vancomycin cefepime and Flagyl as mentioned above. - Sepsis manifested by leukocytosis, heart rate of 100 and respiratory rate of 34. - He had brief hypotension that can qualify him for severe sepsis.  This has resolved. - We will follow blood and peritoneal fluid cultures.

## 2022-03-17 NOTE — ED Triage Notes (Signed)
Peritoneal dialysis patient , sent by physician , pain started last night

## 2022-03-17 NOTE — Assessment & Plan Note (Signed)
-   We will continue his antihypertensives. 

## 2022-03-17 NOTE — Progress Notes (Addendum)
Pharmacy Antibiotic Note  [name] is a 76 y.o. male w/ ESRD on PD and recurrent episodes of peritonitis admitted on 03/17/2022 with peritonitis.  Pharmacy has been consulted for vancomycin and cefepime dosing.  Plan:  1) start vancomycin 2000 mg IV x 1 --check vancomycin between day 3 - 5  --Re-dose vancomycin once trough <20 mcg/mL --Monitor for changes in the dialysis prescription  2) start cefepime 1 gram IV every 24 hours  Height: '5\' 8"'$  (172.7 cm) Weight: 93 kg (205 lb 0.4 oz) IBW/kg (Calculated) : 68.4  Temp (24hrs), Avg:98.9 F (37.2 C), Min:98.9 F (37.2 C), Max:98.9 F (37.2 C)  Recent Labs  Lab 03/17/22 1731 03/17/22 1732  WBC 12.8*  --   CREATININE 4.09*  --   LATICACIDVEN  --  2.0*    Estimated Creatinine Clearance: 16.7 mL/min (A) (by C-G formula based on SCr of 4.09 mg/dL (H)).    No Known Allergies  Antimicrobials this admission: 09/19 vancomycin >>  09/19 metronidazole >> 09/19 cefepime >>   Microbiology results: 09/19 peritoneal fluid Cx: pending  Thank you for allowing pharmacy to be a part of this patient's care.  Dallie Piles 03/17/2022 7:54 PM

## 2022-03-17 NOTE — Assessment & Plan Note (Signed)
-   This should be covered with above-mentioned antibiotics. - We will follow urine culture and sensitive.

## 2022-03-17 NOTE — Assessment & Plan Note (Signed)
-   The will be admitted to a medical telemetry bed. - We will continue antibiotic therapy with IV vancomycin cefepime and Flagyl. - Pain management will be provided. - We will follow peritoneal dialysis fluid analysis. - Nephrology consult will be obtained. - Dr. Holley Raring is aware about the patient.

## 2022-03-18 DIAGNOSIS — A419 Sepsis, unspecified organism: Secondary | ICD-10-CM | POA: Diagnosis present

## 2022-03-18 DIAGNOSIS — K652 Spontaneous bacterial peritonitis: Secondary | ICD-10-CM | POA: Diagnosis not present

## 2022-03-18 LAB — PROTIME-INR
INR: 1.1 (ref 0.8–1.2)
Prothrombin Time: 14.2 seconds (ref 11.4–15.2)

## 2022-03-18 LAB — CBC
HCT: 36.6 % — ABNORMAL LOW (ref 39.0–52.0)
Hemoglobin: 12.4 g/dL — ABNORMAL LOW (ref 13.0–17.0)
MCH: 32.3 pg (ref 26.0–34.0)
MCHC: 33.9 g/dL (ref 30.0–36.0)
MCV: 95.3 fL (ref 80.0–100.0)
Platelets: 153 10*3/uL (ref 150–400)
RBC: 3.84 MIL/uL — ABNORMAL LOW (ref 4.22–5.81)
RDW: 13.7 % (ref 11.5–15.5)
WBC: 12.2 10*3/uL — ABNORMAL HIGH (ref 4.0–10.5)
nRBC: 0 % (ref 0.0–0.2)

## 2022-03-18 LAB — BASIC METABOLIC PANEL
Anion gap: 12 (ref 5–15)
BUN: 28 mg/dL — ABNORMAL HIGH (ref 8–23)
CO2: 25 mmol/L (ref 22–32)
Calcium: 8.6 mg/dL — ABNORMAL LOW (ref 8.9–10.3)
Chloride: 97 mmol/L — ABNORMAL LOW (ref 98–111)
Creatinine, Ser: 4.34 mg/dL — ABNORMAL HIGH (ref 0.61–1.24)
GFR, Estimated: 13 mL/min — ABNORMAL LOW (ref >60)
Glucose, Bld: 98 mg/dL (ref 70–99)
Potassium: 3.4 mmol/L — ABNORMAL LOW (ref 3.5–5.1)
Sodium: 134 mmol/L — ABNORMAL LOW (ref 135–145)

## 2022-03-18 LAB — PATHOLOGIST SMEAR REVIEW

## 2022-03-18 LAB — LACTIC ACID, PLASMA: Lactic Acid, Venous: 1.6 mmol/L (ref 0.5–1.9)

## 2022-03-18 LAB — PROCALCITONIN: Procalcitonin: 1.61 ng/mL

## 2022-03-18 LAB — MAGNESIUM: Magnesium: 1.1 mg/dL — ABNORMAL LOW (ref 1.7–2.4)

## 2022-03-18 LAB — CORTISOL-AM, BLOOD: Cortisol - AM: 18.2 ug/dL (ref 6.7–22.6)

## 2022-03-18 MED ORDER — HEPARIN SODIUM (PORCINE) 5000 UNIT/ML IJ SOLN: 5000.0000 [IU] | Freq: Three times a day (TID) | INTRAMUSCULAR | Status: DC

## 2022-03-18 MED ORDER — HEPARIN SODIUM (PORCINE) 5000 UNIT/ML IJ SOLN
5000.0000 [IU] | Freq: Three times a day (TID) | INTRAMUSCULAR | Status: DC
  Administered 2022-03-18 – 2022-03-19 (×4): 5000 [IU] via SUBCUTANEOUS
  Filled 2022-03-18 (×4): qty 1

## 2022-03-18 MED ORDER — POTASSIUM CHLORIDE 20 MEQ PO PACK
40.0000 meq | PACK | Freq: Once | ORAL | Status: AC
  Administered 2022-03-18: 40 meq via ORAL
  Filled 2022-03-18: qty 2

## 2022-03-18 MED ORDER — MAGNESIUM SULFATE 4 GM/100ML IV SOLN
4.0000 g | Freq: Once | INTRAVENOUS | Status: AC
  Administered 2022-03-18: 4 g via INTRAVENOUS
  Filled 2022-03-18: qty 100

## 2022-03-18 MED ORDER — METOPROLOL TARTRATE 5 MG/5ML IV SOLN
2.5000 mg | Freq: Four times a day (QID) | INTRAVENOUS | Status: DC | PRN
  Administered 2022-03-18: 2.5 mg via INTRAVENOUS
  Filled 2022-03-18: qty 5

## 2022-03-18 MED ORDER — MAGNESIUM SULFATE 2 GM/50ML IV SOLN: 2.0000 g | Freq: Once | INTRAVENOUS | Status: DC

## 2022-03-18 MED ORDER — INFLUENZA VAC A&B SA ADJ QUAD 0.5 ML IM PRSY
0.5000 mL | PREFILLED_SYRINGE | INTRAMUSCULAR | Status: DC
  Filled 2022-03-18: qty 0.5

## 2022-03-18 MED ORDER — PNEUMOCOCCAL 20-VAL CONJ VACC 0.5 ML IM SUSY
0.5000 mL | PREFILLED_SYRINGE | INTRAMUSCULAR | Status: DC
  Filled 2022-03-18: qty 0.5

## 2022-03-18 NOTE — Progress Notes (Signed)
[facility]  ROUNDING NOTE   Subjective:   [name] is a 76 year old male past medical history including hypertension, dyslipidemia, kidney artery disease, COPD, and end-stage renal disease on peritoneal dialysis with recurrent peritonitis.  Patient presents to the emergency room with abdominal pain and has been admitted for SBP (spontaneous bacterial peritonitis) ([place]) [K65.2]  Patient is known to our clinic and is followed at our [facility] clinic by Dr. Juleen China.  Patient has had multiple complaints of cloudy drainage fluid with abdominal pain resulting in peritonitis over the past few months.  Patient was instructed by the outpatient dialysis clinic to come to the emergency department for evaluation.  Patient is seen resting quietly in bed, wife at bedside Wife states that they have continued nightly dialysis treatments however she has recently began working again and has a neighbor to disconnect patient in the mornings.  Patient complains of mild tenderness in abdomen, no lower extremity edema.  Patient remains on room air.  Labs on ED arrival include sodium 134, potassium 3.3, BUN 25, creatinine 4.09 with GFR 14, magnesium 1.2, albumin 2.2, and elevated WBCs 12.8.  Lactic acid 2.0.  Peritoneal fluid cell count 2700 with a hazy appearance.  PD fluid culture pending.  We have been consulted to evaluate dialysis needs.   Objective:  Vital signs in last 24 hours:  Temp:  [98.7 F (37.1 C)-98.9 F (37.2 C)] 98.7 F (37.1 C) (09/20 0720) Pulse Rate:  [42-100] 45 (09/20 1000) Resp:  [9-23] 14 (09/20 1000) BP: (80-121)/(58-82) 93/60 (09/20 1000) SpO2:  [90 %-98 %] 93 % (09/20 1000) Weight:  [93 kg] 93 kg (09/19 1729)  Weight change:  Filed Weights   03/17/22 1729  Weight: 93 kg    Intake/Output: No intake/output data recorded.   Intake/Output this shift:  No intake/output data recorded.  Physical Exam: General: NAD  Head: Normocephalic, atraumatic. Moist oral  mucosal membranes  Eyes: Anicteric  Lungs:  Clear to auscultation, normal effort  Heart: Regular rate and rhythm  Abdomen:  Soft, mild tender, nondistended  Extremities: No peripheral edema.  Neurologic: Nonfocal, moving all four extremities  Skin: No lesions  Access: PD catheter    Basic Metabolic Panel: Recent Labs  Lab 03/17/22 1731 03/18/22 0455  NA 134* 134*  K 3.3* 3.4*  CL 93* 97*  CO2 29 25  GLUCOSE 105* 98  BUN 25* 28*  CREATININE 4.09* 4.34*  CALCIUM 8.9 8.6*  MG 1.2* 1.1*    Liver Function Tests: Recent Labs  Lab 03/17/22 1731  AST 29  ALT 9  ALKPHOS 115  BILITOT 1.1  PROT 6.2*  ALBUMIN 2.2*   Recent Labs  Lab 03/17/22 1731  LIPASE 41   No results for input(s): "AMMONIA" in the last 168 hours.  CBC: Recent Labs  Lab 03/17/22 1731 03/18/22 0455  WBC 12.8* 12.2*  HGB 13.0 12.4*  HCT 38.7* 36.6*  MCV 95.3 95.3  PLT 184 153    Cardiac Enzymes: No results for input(s): "CKTOTAL", "CKMB", "CKMBINDEX", "TROPONINI" in the last 168 hours.  BNP: Invalid input(s): "POCBNP"  CBG: No results for input(s): "GLUCAP" in the last 168 hours.  Microbiology: Results for orders placed or performed during the hospital encounter of 03/17/22  Body fluid culture w Gram Stain     Status: None (Preliminary result)   Collection Time: 03/17/22  7:36 PM   Specimen: Peritoneal Washings; Body Fluid  Result Value Ref Range Status   Specimen Description   Final  PERITONEAL Performed at [facility], [address] [place], [place] [address]    Special Requests   Final    PERITONEAL Performed at [facility], [place], [place] [address]    Gram Stain   Final    CYTOSPIN SMEAR WBC PRESENT, PREDOMINANTLY PMN GRAM POSITIVE COCCI IN PAIRS IN SINGLES Performed at [facility], [place] [address], [place], [place] [address]    Culture PENDING  Incomplete   Report Status PENDING  Incomplete    Coagulation Studies: No  results for input(s): "LABPROT", "INR" in the last 72 hours.  Urinalysis: Recent Labs    03/17/22 1853  COLORURINE AMBER*  LABSPEC >1.046*  PHURINE 5.0  GLUCOSEU >=500*  HGBUR NEGATIVE  BILIRUBINUR NEGATIVE  KETONESUR NEGATIVE  PROTEINUR 30*  NITRITE NEGATIVE  LEUKOCYTESUR SMALL*      Imaging: No results found.   Medications:    ceFEPime (MAXIPIME) IV Stopped (03/17/22 2208)   metronidazole 500 mg (03/18/22 1139)    aspirin EC  81 mg Oral Daily   cyanocobalamin  1,000 mcg Intramuscular Q30 days   finasteride  5 mg Oral Daily   heparin injection (subcutaneous)  5,000 Units Subcutaneous Q8H   leptospermum manuka honey  1 Application Topical Daily   levothyroxine  175 mcg Oral Q0600   metoprolol tartrate  12.5 mg Oral BID   multivitamin  1 tablet Oral Daily   pantoprazole  40 mg Oral Daily   potassium chloride SA  20 mEq Oral BID   sertraline  50 mg Oral Daily   simvastatin  40 mg Oral QPM   vancomycin variable dose per unstable renal function (pharmacist dosing)   Does not apply See admin instructions   vortioxetine HBr  20 mg Oral Daily   acetaminophen **OR** acetaminophen, docusate sodium, ondansetron **OR** ondansetron (ZOFRAN) IV, polyethylene glycol, traZODone  Assessment/ Plan:  Mr. [name] is a 76 y.o.  male past medical history including hypertension, dyslipidemia, kidney artery disease, COPD, and end-stage renal disease on peritoneal dialysis with recurrent peritonitis.  Patient presents to the emergency room with abdominal pain and has been admitted for SBP (spontaneous bacterial peritonitis) ([place]) [K65.2]  [facility]/PD  Recurrent peritonitis, bacterial.  Cell count greater than 2700.  Culture pending.  Multiple occurrences over the past few months.  Currently receiving vancomycin and cefepime.  Would suggest PD catheter be removed due to recurrence however patient unstable for this procedure at this time.  We will continue antibiotic therapy per  primary team.  2.  End-stage renal disease on peritoneal dialysis.  Due to multiple infections recently, would suggest peritoneal dialysis catheter be removed and patient transferred to hemodialysis.  A barrier to this plan includes patient's hypotension and deconditioned state, patient will not be able to tolerate sitting up in a chair to receive outpatient treatments.  3. Anemia of chronic kidney disease.  Lab Results  Component Value Date   HGB 12.4 (L) 03/18/2022    Hemoglobin acceptable at this time  4. Secondary Hyperparathyroidism: with outpatient labs: PTH 69, phosphorus 3.2, calcium 9.9 on 03/05/22.   Lab Results  Component Value Date   CALCIUM 8.6 (L) 03/18/2022   CAION 1.11 (L) 06/27/2021   PHOS 2.2 (L) 02/01/2022    Discussed treatment plan and patient's current condition with wife.  Fully discussed risk, benefits, and concerns and we feel patient may not be able to tolerate hemodialysis in his current condition.  Patient's wife states her understanding and  request transition to comfort measures with discontinuing dialysis treatments.  Recommend consulting hospice to provide compassionate end-of-life care.   LOS: 1   9/20/202311:41 AM

## 2022-03-18 NOTE — ED Notes (Signed)
Informed rn bed assigned 

## 2022-03-18 NOTE — Progress Notes (Signed)
  Progress Note Patient: [name] OQH:[id number] DOB: 11/25/1945 DOA: 03/17/2022  DOS: the patient was seen and examined on 03/18/2022  Brief hospital course: PMH of ESRD on PD with recurrent peritonitis, gout, HTN, hypothyroidism, HLD, PAD, COPD, CAD, moderate aortic stenosis on bioprosthetic aortic valve. Presented to the hospital with complaints of abdominal pain.  Found to SBP with sepsis.  Currently receiving IV antibiotics.  Nephrology following. Assessment and Plan: SBP (spontaneous bacterial peritonitis) ([place]) Sepsis due to undetermined organism ([place]) Possible UTI. Sepsis present on admission.  Meeting SIRS criteria with tachycardia and tachypnea and leukocytosis. Secondary to SBP. Nephrology following. Most likely will require PD catheter removal. Currently on IV vancomycin and cefepime. Follow-up on cultures.  Hypokalemia Hypomagnesemia. Currently being corrected.  ESRD (end stage renal disease) on dialysis [facility]) Gets dialysis via PD.  PD will be removed. May require a switch to HD.  Hypothyroidism, unspecified Continue Synthroid  Hypertension Blood pressure soft. Monitor.  Atrial flutter. Seen on telemetry. Treated with IV Lopressor. Monitor for now.  Depression Continue current regimen.  Dyslipidemia Continue statin.  Obesity Body mass index is 31.17 kg/m.  Placing the pt at higher risk of poor outcomes.  Subjective: No nausea no vomiting no fever no chills.  No chest pain.  Continues to have abdominal pain.  No diarrhea no constipation.  Passing gas.  Physical Exam: Vitals:   03/18/22 1130 03/18/22 1251 03/18/22 1400 03/18/22 1530  BP: 108/79  97/68 99/70  Pulse: 92  69 79  Resp: '18  15 16  '$ Temp:  98.3 F (36.8 C)    TempSrc:  Oral    SpO2: 91%  94% 97%  Weight:      Height:       General: Appear in mild distress; no visible Abnormal Neck Mass Or lumps, Conjunctiva normal Cardiovascular: S1 and S2 Present, aortic systolic   Murmur, Respiratory: good respiratory effort, Bilateral Air entry present and faint basal Crackles, no wheezes Abdomen: Bowel Sound present, diffusely tender Extremities: no Pedal edema Neurology: alert and oriented to time, place, and person  Gait not checked due to patient safety concerns   Data Reviewed: I have Reviewed nursing notes, Vitals, and Lab results since pt's last encounter. Pertinent lab results CBC and BMP I have ordered test including CBC and BMP, Mg     Family Communication: No one at bedside  Disposition: Status is: Inpatient Remains inpatient appropriate because: Need for IV antibiotics and further work-up for recurrent peritonitis  Author: Berle Mull, MD 03/18/2022 4:57 PM  Please look on [email or web address] to find out who is on call.

## 2022-03-18 NOTE — Hospital Course (Addendum)
PMH of ESRD on PD with recurrent peritonitis, gout, HTN, hypothyroidism, HLD, PAD, COPD, CAD, moderate aortic stenosis on bioprosthetic aortic valve. Presented to the hospital with complaints of abdominal pain.  Found to SBP with sepsis.  Started on IV antibiotics.  Nephrology was consulted.  Discussed with the family.  Decision was made to go home with hospice due to recurrent nature of patient's presentation and poor overall prognosis

## 2022-03-18 NOTE — ED Provider Notes (Signed)
-----------------------------------------   1:15 PM on 03/18/2022 -----------------------------------------  Patient is tachycardic.  I was informed by the RN that the EKG was concerning for STEMI.  He has no chest pain at this time.  ED ECG REPORT I, Arta Silence, the attending physician, personally viewed and interpreted this ECG.  Date: 03/18/2022 EKG Time: 1311 Rate: 133 Rhythm: normal sinus rhythm QRS Axis: normal Intervals: Left bundle pattern ST/T Wave abnormalities: LVH with repolarization abnormality Narrative Interpretation: Nonspecific ST abnormalities with no evidence of acute ischemia; similar morphology to prior EKGs from yesterday, 8/2, and 7/6   Overall EKG morphology is similar to prior EKGs with no new ST changes.  The ST elevations and repolarization abnormality seen on prior EKGs may be more exaggerated due to the increased heart rate.  At this time there is no indication for code STEMI activation or emergent cardiology consult.  The RN is notifying the admitting physician.     Arta Silence, MD 03/18/22 1316

## 2022-03-19 ENCOUNTER — Other Ambulatory Visit: Payer: Self-pay

## 2022-03-19 ENCOUNTER — Encounter: Payer: Self-pay | Admitting: Internal Medicine

## 2022-03-19 DIAGNOSIS — K652 Spontaneous bacterial peritonitis: Secondary | ICD-10-CM | POA: Diagnosis not present

## 2022-03-19 DIAGNOSIS — Z515 Encounter for palliative care: Secondary | ICD-10-CM

## 2022-03-19 LAB — CBC
HCT: 33.4 % — ABNORMAL LOW (ref 39.0–52.0)
Hemoglobin: 11.4 g/dL — ABNORMAL LOW (ref 13.0–17.0)
MCH: 32.7 pg (ref 26.0–34.0)
MCHC: 34.1 g/dL (ref 30.0–36.0)
MCV: 95.7 fL (ref 80.0–100.0)
Platelets: 157 10*3/uL (ref 150–400)
RBC: 3.49 MIL/uL — ABNORMAL LOW (ref 4.22–5.81)
RDW: 13.9 % (ref 11.5–15.5)
WBC: 11.3 10*3/uL — ABNORMAL HIGH (ref 4.0–10.5)
nRBC: 0 % (ref 0.0–0.2)

## 2022-03-19 LAB — RENAL FUNCTION PANEL
Albumin: 1.9 g/dL — ABNORMAL LOW (ref 3.5–5.0)
Anion gap: 12 (ref 5–15)
BUN: 35 mg/dL — ABNORMAL HIGH (ref 8–23)
CO2: 23 mmol/L (ref 22–32)
Calcium: 8.7 mg/dL — ABNORMAL LOW (ref 8.9–10.3)
Chloride: 98 mmol/L (ref 98–111)
Creatinine, Ser: 5.01 mg/dL — ABNORMAL HIGH (ref 0.61–1.24)
GFR, Estimated: 11 mL/min — ABNORMAL LOW (ref >60)
Glucose, Bld: 105 mg/dL — ABNORMAL HIGH (ref 70–99)
Phosphorus: 3.6 mg/dL (ref 2.5–4.6)
Potassium: 3.7 mmol/L (ref 3.5–5.1)
Sodium: 133 mmol/L — ABNORMAL LOW (ref 135–145)

## 2022-03-19 LAB — MAGNESIUM: Magnesium: 2.1 mg/dL (ref 1.7–2.4)

## 2022-03-19 MED ORDER — ATROPINE SULFATE 1 % OP SOLN: 2.0000 [drp] | OPHTHALMIC | 0 refills | Status: AC | PRN

## 2022-03-19 MED ORDER — BISACODYL 10 MG RE SUPP: 10.0000 mg | RECTAL | 0 refills | Status: AC | PRN

## 2022-03-19 MED ORDER — HALOPERIDOL 1 MG PO TABS: 1.0000 mg | ORAL_TABLET | Freq: Three times a day (TID) | ORAL | 0 refills | Status: AC | PRN

## 2022-03-19 MED ORDER — OXYCODONE HCL 5 MG PO TABS: 5.0000 mg | ORAL_TABLET | ORAL | 0 refills | Status: AC | PRN

## 2022-03-19 MED ORDER — LORAZEPAM 1 MG PO TABS: 1.0000 mg | ORAL_TABLET | Freq: Four times a day (QID) | ORAL | 0 refills | Status: AC | PRN

## 2022-03-19 NOTE — Progress Notes (Signed)
Nutrition Brief Note  Chart reviewed. Pt awaiting palliative care consult. Pt is no longer an candidate for further dialysis treatments. Plan to go home with hospice today.  No further nutrition interventions planned at this time.  Please re-consult as needed.   Loistine Chance, RD, LDN, [place] Registered Dietitian II Certified Diabetes Care and Education Specialist Please refer to [facility] for RD and/or RD on-call/weekend/after hours pager

## 2022-03-19 NOTE — Progress Notes (Signed)
Did a manual drain on PT's Peritoneal. Removed about 1200 ML of cloudy fluid.

## 2022-03-19 NOTE — TOC Initial Note (Signed)
Transition of Care [facility]) - Initial/Assessment Note    Patient Details  Name: [name] MRN: [id number] Date of Birth: 08-05-1945  Transition of Care [facility]) CM/SW Contact:    Alberteen Sam, LCSW Phone Number: 03/19/2022, 10:10 AM  Clinical Narrative:                  CSW met with patient's spouse [name] who confirms plan at this time is for patient to go home with [facility], as he was previously active with their outpatient palliative. She reports having all dme at home including hospital bed, hoyer, etc. Patient will need ems transport home.   NO further questions or concerns at this time.   Expected Discharge Plan: Home w Hospice Care Barriers to Discharge: Continued Medical Work up   Patient Goals and CMS Choice Patient states their goals for this hospitalization and ongoing recovery are:: to go home CMS Medicare.gov Compare Post Acute Care list provided to:: Patient Represenative (must comment)    Expected Discharge Plan and Services Expected Discharge Plan: [place] Acute Care Choice: Hospice Living arrangements for the past 2 months: Single Family Home                                      Prior Living Arrangements/Services Living arrangements for the past 2 months: Single Family Home Lives with:: Spouse                   Activities of Daily Living Home Assistive Devices/Equipment: Eyeglasses, Hospital bed, [employer], Multimedia programmer chair with back, Transfer belt, Teacher, adult education, Wheelchair, Bathtub lift ADL Screening (condition at time of admission) Patient's cognitive ability adequate to safely complete daily activities?: No Is the patient deaf or have difficulty hearing?: No Does the patient have difficulty seeing, even when wearing glasses/contacts?: No Does the patient have difficulty concentrating, remembering, or making decisions?: Yes Patient able to express need for assistance with ADLs?: Yes Does the patient have  difficulty dressing or bathing?: Yes Independently performs ADLs?: No Communication: Independent Dressing (OT): Needs assistance Is this a change from baseline?: Pre-admission baseline Grooming: Needs assistance Is this a change from baseline?: Pre-admission baseline Feeding: Needs assistance Is this a change from baseline?: Pre-admission baseline Bathing: Needs assistance Is this a change from baseline?: Pre-admission baseline Toileting: Needs assistance Is this a change from baseline?: Pre-admission baseline In/Out Bed: Needs assistance Is this a change from baseline?: Pre-admission baseline Walks in Home: Dependent Is this a change from baseline?: Pre-admission baseline Does the patient have difficulty walking or climbing stairs?: Yes Weakness of Legs: Both Weakness of Arms/Hands: Both  Permission Sought/Granted                  Emotional Assessment              Admission diagnosis:  SBP (spontaneous bacterial peritonitis) ([place]) [K65.2] Bacterial peritonitis ([place]) [K65.9] Severe sepsis ([place]) [A41.9, R65.20] Patient Active Problem List   Diagnosis Date Noted   Severe sepsis ([place]) 03/18/2022   SBP (spontaneous bacterial peritonitis) ([place]) 03/17/2022   Sepsis due to undetermined organism ([place]) 03/17/2022   Acute lower UTI 03/17/2022   Dyslipidemia 03/17/2022   GI bleeding 01/28/2022   GI bleed 01/27/2022   ESRD (end stage renal disease) ([place]) 01/04/2022   Malnutrition of moderate degree 01/02/2022   Intractable nausea and vomiting 01/01/2022  Failure to thrive in adult 01/01/2022   Abnormal weight loss 01/01/2022   Dysphagia 01/01/2022   Dehydration 01/01/2022   Lactic acidosis due to dehydration/poor oral intake 01/01/2022   DNR (do not resuscitate) 01/01/2022   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   UTI (urinary tract infection) 12/16/2021   History of CVA (cerebrovascular accident) 12/16/2021   Acute peritonitis ([place]) 12/14/2021   Pressure injury  of skin 12/14/2021   Vitamin B12 deficiency (non anemic) 10/06/2021   H/O aortic valve replacement with porcine valve 09/18/2021   Generalized anxiety disorder 11/14/2020   Pulmonary emboli ([place]) 08/21/2020   CHF (congestive heart failure) ([place]) 07/26/2020   ESRD (end stage renal disease) on dialysis ([place]) 07/26/2020   History of thyroid disease 07/26/2020   Acute GI bleeding 07/14/2020   Symptomatic anemia    Hypokalemia 07/10/2020   Endocarditis    Elevated d-dimer    Acute on chronic congestive heart failure ([place])    Demand ischemia (HCC)    Severe sepsis without septic shock (CODE) (HCC)    Enterococcal bacteremia    S/P AVR (aortic valve replacement)    Fever 06/08/2020   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   Essential (primary) hypertension    Chronic obstructive pulmonary disease, unspecified ([place])    Hyperlipidemia, unspecified    Hypothyroidism, unspecified    Atherosclerotic heart disease of native coronary artery without angina pectoris    Gout, unspecified    Major depressive disorder, single episode, unspecified    Morbid (severe) obesity due to excess calories (HCC)    Nonrheumatic aortic (valve) stenosis    Orthostatic hypotension    Hypertension    Thyroid disease    Arthritis    COPD (chronic obstructive pulmonary disease) (HCC)    Heart murmur    Aortic stenosis    CAD (coronary artery disease)    Syncope    Depression    Kidney stones    History of myocardial infarction 10/12/2011   Personal history of cardiac murmur 10/12/2011   Snoring 10/12/2011   Balanitis 09/24/2011   PCP:  Celene Squibb, MD Pharmacy:   [facility] [id number], [place] [address] Phone: [phone] Fax: [phone]     Social Determinants of Health (SDOH) Interventions    Readmission Risk Interventions    12/15/2021    4:03 PM  Readmission Risk Prevention Plan  Transportation  Screening Complete  PCP or Specialist Appt within 3-5 Days Complete  HRI or [employer] Complete  Social Work Consult for [place] Planning/Counseling Complete  Palliative Care Screening Not Applicable  Medication Review Press photographer) Complete

## 2022-03-20 LAB — BODY FLUID CULTURE W GRAM STAIN

## 2022-03-20 NOTE — Discharge Summary (Signed)
Physician Discharge Summary   Patient: [name] MRN: [id number] DOB: 14-Aug-1945  Admit date:     03/17/2022  Discharge date: 03/19/2022  Discharge Physician: Berle Mull  PCP: Celene Squibb, MD  Recommendations at discharge: Establish care with home hospice.  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 Presented to the hospital with complaints of abdominal pain.  Found to SBP with sepsis.  Started on IV antibiotics.  Nephrology was consulted.  Discussed with the family.  Decision was made to go home with hospice due to recurrent nature of patient's presentation and poor overall prognosis  Assessment and Plan  SBP (spontaneous bacterial peritonitis) ([place]) Sepsis due to undetermined organism ([place]) Possible UTI. Sepsis present on admission.  Meeting SIRS criteria with tachycardia and tachypnea and leukocytosis. Secondary to SBP. Nephrology was consulted.  PD catheter culture grew staph epidermis. Will require the PD catheter removal also not a good candidate for transition to HD. Based on the discussion with the family and the patient decision was made to transition to comfort care and hospice at home as the patient has poor prognosis due to his ESRD and recurrent peritonitis.  Goals of care conversation. Hospice patient. Nephrology discussed with the patient and the family and the decision was made to transition to comfort care. I verified family's intent and had discussion with regards to what hospice  offers. TOC was consulted.  Hospice was arranged.  I discussed with [facility] hospice agency as well. Medications were prescribed to help with symptom control at home.   Hypokalemia Hypomagnesemia. Replaced.   ESRD (end stage renal disease) on dialysis [facility]) Gets dialysis via PD.  Now comfort care.   Hypothyroidism, unspecified Now comfort care.   Hypertension Now comfort care.   Atrial flutter. Seen on telemetry. Treated with IV Lopressor. Now comfort care.   Depression Continue current regimen.   Dyslipidemia Discontinue statin.   Consultants:  Nephrology  Procedures performed:  none  DISCHARGE MEDICATION: Allergies as of 03/19/2022   No Known Allergies      Medication List     STOP taking these medications    aspirin EC 81 MG tablet   cyanocobalamin 1000 MCG/ML injection Commonly known as: VITAMIN B12   heparin 10000 UNIT/ML injection   metoprolol tartrate 25 MG tablet Commonly known as: LOPRESSOR   potassium chloride SA 20 MEQ tablet Commonly known as: KLOR-CON M   [place] Caps 1 MG Caps   simvastatin 40 MG tablet Commonly known as: ZOCOR       TAKE these medications    atropine 1 % ophthalmic solution Place 2 drops under the tongue every 4 (four) hours as needed (excessive secretion).   bisacodyl 10 MG suppository Commonly known as: Dulcolax Place 1 suppository (10 mg total) rectally as needed for moderate constipation.   docusate sodium 100 MG capsule Commonly known as: COLACE Take 200 mg by mouth 2 (two) times daily as needed for mild constipation.   finasteride 5 MG tablet Commonly known as: PROSCAR Take 5 mg by mouth daily.  haloperidol 1 MG tablet Commonly known as: HALDOL Take 1 tablet (1 mg total) by mouth every 8 (eight) hours as needed for agitation.   levothyroxine 175 MCG tablet Commonly known as: SYNTHROID Take 175 mcg by mouth daily before breakfast.   LORazepam 1 MG tablet Commonly known as: ATIVAN Take 1  tablet (1 mg total) by mouth every 6 (six) hours as needed for anxiety, sleep or seizure.   ondansetron 4 MG disintegrating tablet Commonly known as: ZOFRAN-ODT Take 4 mg by mouth every 8 (eight) hours as needed. What changed: Another medication with the same name was removed. Continue taking this medication, and follow the directions you see here.   oxyCODONE 5 MG immediate release tablet Commonly known as: Oxy IR/ROXICODONE Take 1 tablet (5 mg total) by mouth every 4 (four) hours as needed for severe pain or moderate pain (or Shortness of breath).   pantoprazole 40 MG tablet Commonly known as: PROTONIX Take 40 mg by mouth daily.   polyethylene glycol 17 g packet Commonly known as: MIRALAX / GLYCOLAX Take 17 g by mouth daily. What changed:  when to take this reasons to take this   Santyl 250 UNIT/GM ointment Generic drug: collagenase Apply 1 application. topically daily as needed (sore).   sertraline 50 MG tablet Commonly known as: ZOLOFT Take 50 mg by mouth daily.   Trintellix 20 MG Tabs tablet Generic drug: vortioxetine HBr Take 20 mg by mouth daily.       Disposition: Home with hospice Diet recommendation: Regular diet Pureed  Discharge Exam: Vitals:   03/19/22 0500 03/19/22 0619 03/19/22 0755 03/19/22 1139  BP:  93/64 115/68 (!) 102/56  Pulse:  65 63 (!) 51  Resp:  '20 17 20  '$ Temp:  97.9 F (36.6 C) 98.3 F (36.8 C) 98.5 F (36.9 C)  TempSrc:      SpO2:  97% 99% 100%  Weight: 83.8 kg     Height:       General: Appear in mild distress; no visible Abnormal Neck Mass Or lumps, Conjunctiva normal Cardiovascular: S1 and S2 Present, aortic systolic Murmur, Respiratory: good respiratory effort, Bilateral Air entry present and CTA, no Crackles, no wheezes Abdomen: Bowel Sound present, distended, mild tenderness diffusely Extremities: no Pedal edema Neurology: alert and oriented to place and person  Filed Weights   03/17/22 1729 03/18/22 1831 03/19/22 0500   Weight: 93 kg 86.5 kg 83.8 kg   Condition at discharge: stable  The results of significant diagnostics from this hospitalization (including imaging, microbiology, ancillary and laboratory) are listed below for reference.   Imaging Studies: No results found.  Microbiology: Results for orders placed or performed during the hospital encounter of 03/17/22  Body fluid culture w Gram Stain     Status: None   Collection Time: 03/17/22  7:36 PM   Specimen: Peritoneal Washings; Body Fluid  Result Value Ref Range Status   Specimen Description   Final    PERITONEAL Performed at [facility], [address] [place], [place] [address]    Special Requests   Final    PERITONEAL Performed at [facility], [place], [address] [address]    Gram Stain   Final    CYTOSPIN SMEAR WBC PRESENT, PREDOMINANTLY PMN GRAM POSITIVE COCCI IN PAIRS IN SINGLES Performed at [facility], [place] [address], [place], [place] [address]    Culture FEW STAPHYLOCOCCUS EPIDERMIDIS  Final   Report Status 03/20/2022 FINAL  Final   Organism  ID, Bacteria STAPHYLOCOCCUS EPIDERMIDIS  Final      Susceptibility   Staphylococcus epidermidis - MIC*    CIPROFLOXACIN <=0.5 SENSITIVE Sensitive     ERYTHROMYCIN <=0.25 SENSITIVE Sensitive     GENTAMICIN <=0.5 SENSITIVE Sensitive     OXACILLIN <=0.25 SENSITIVE Sensitive     TETRACYCLINE <=1 SENSITIVE Sensitive     VANCOMYCIN 1 SENSITIVE Sensitive     TRIMETH/SULFA <=10 SENSITIVE Sensitive     CLINDAMYCIN <=0.25 SENSITIVE Sensitive     RIFAMPIN <=0.5 SENSITIVE Sensitive     Inducible Clindamycin NEGATIVE Sensitive     * FEW STAPHYLOCOCCUS EPIDERMIDIS   Labs: CBC: Recent Labs  Lab 03/17/22 1731 03/18/22 0455 03/19/22 0521  WBC 12.8* 12.2* 11.3*  HGB 13.0 12.4* 11.4*  HCT 38.7* 36.6* 33.4*  MCV 95.3 95.3 95.7  PLT 184 153 671   Basic Metabolic Panel: Recent Labs  Lab 03/17/22 1731 03/18/22 0455 03/19/22 0521  NA 134* 134*  133*  K 3.3* 3.4* 3.7  CL 93* 97* 98  CO2 '29 25 23  '$ GLUCOSE 105* 98 105*  BUN 25* 28* 35*  CREATININE 4.09* 4.34* 5.01*  CALCIUM 8.9 8.6* 8.7*  MG 1.2* 1.1* 2.1  PHOS  --   --  3.6   Liver Function Tests: Recent Labs  Lab 03/17/22 1731 03/19/22 0521  AST 29  --   ALT 9  --   ALKPHOS 115  --   BILITOT 1.1  --   PROT 6.2*  --   ALBUMIN 2.2* 1.9*   CBG: No results for input(s): "GLUCAP" in the last 168 hours.  Discharge time spent: greater than 30 minutes.  Signed: Berle Mull, MD [facility] 03/19/2022

## 2022-04-29 DEATH — deceased

## 2022-05-27 ENCOUNTER — Ambulatory Visit: Payer: Medicare Other | Admitting: Cardiology

## 2022-12-29 IMAGING — MR MR HEAD W/O CM
9 of 10 series · 29 of 48 positions shown · non-contrast
Comparison: CT head 07/06/2017, MRI 09/20/2008

CLINICAL DATA: Acute neuro deficit.  Abnormal balance.

EXAM:
MRI HEAD WITHOUT CONTRAST
MRA HEAD WITHOUT CONTRAST
TECHNIQUE: Multiplanar, multi-echo pulse sequences of the brain and surrounding
structures were acquired without intravenous contrast. Angiographic
images of the Circle of Willis were acquired using MRA technique
without intravenous contrast.

[Series 5: DWI · axial · 4.0mm · 0.88mm/px · z∈[-69,+64]mm · 4 of 36 slices shown (1 of 6)]
[im 1/36]
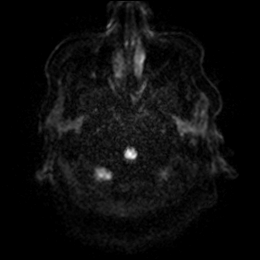
[im 12/36]
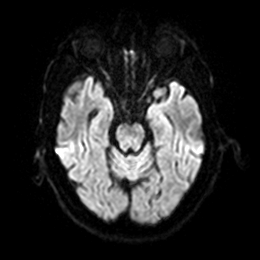
[im 24/36]
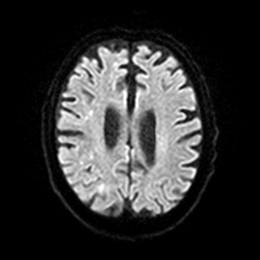
[im 36/36]
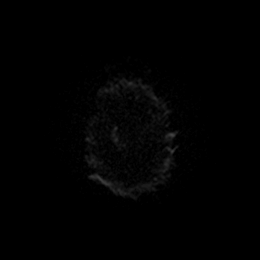

[Series 5: DWI · axial · 4.0mm · 0.88mm/px · z∈[-69,+64]mm · 4 of 36 slices shown (2 of 6)]
[im 1/36]
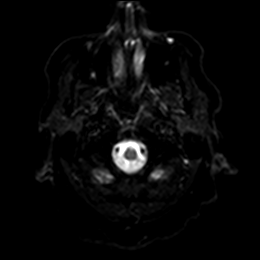
[im 12/36]
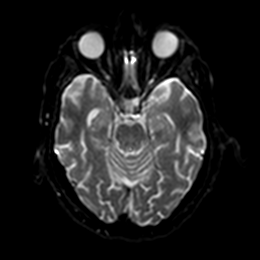
[im 24/36]
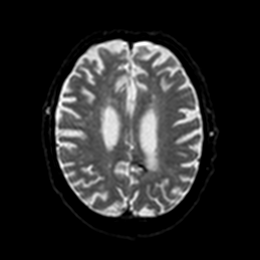
[im 36/36]
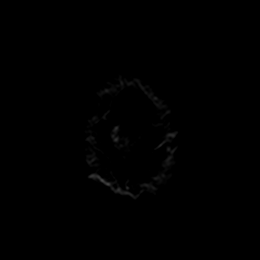

[Series 6: DWI · axial · 4.0mm · 0.88mm/px · z∈[-69,+64]mm · 4 of 36 slices shown (3 of 6)]
[im 1/36]
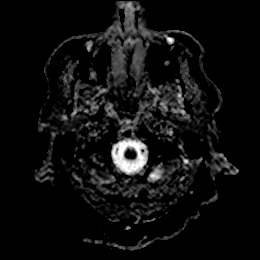
[im 12/36]
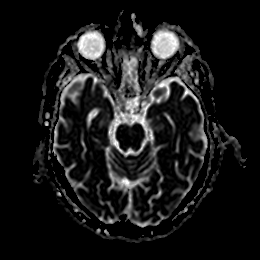
[im 24/36]
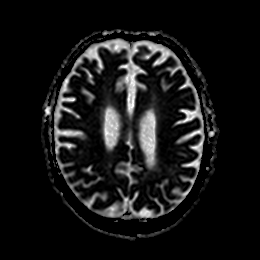
[im 36/36]
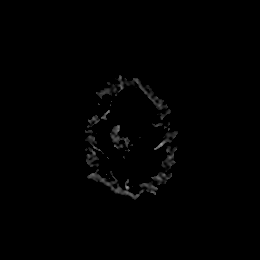

[Series 7: DWI · coronal · 5.0mm · 0.88mm/px · 3 of 28 slices shown (4 of 6)]
[im 1/28]
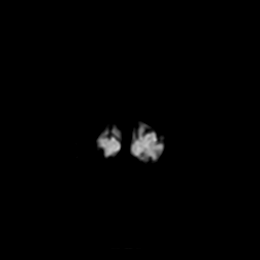
[im 14/28]
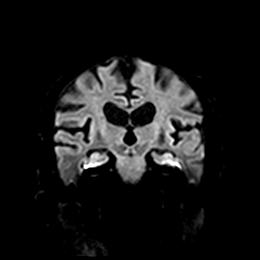
[im 28/28]
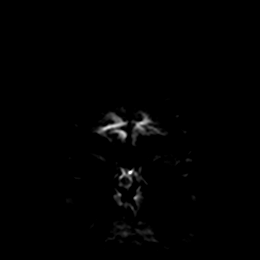

[Series 7: DWI · coronal · 5.0mm · 0.88mm/px · 3 of 28 slices shown (5 of 6)]
[im 1/28]
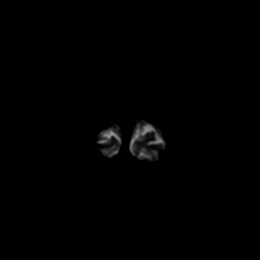
[im 14/28]
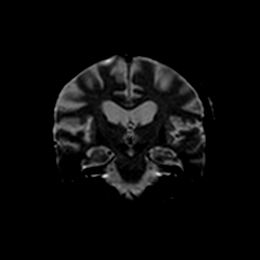
[im 28/28]
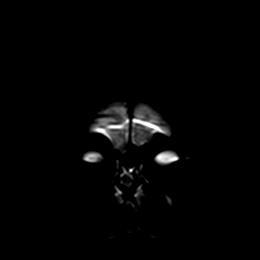

[Series 8: DWI · coronal · 5.0mm · 0.88mm/px · 3 of 28 slices shown (6 of 6)]
[im 1/28]
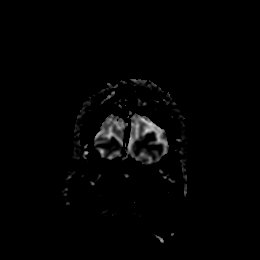
[im 14/28]
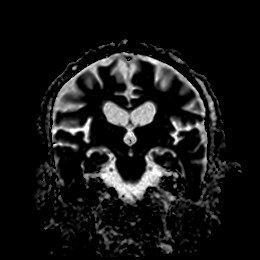
[im 28/28]
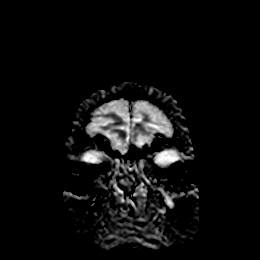

[Series 9: T1 · sagittal · 5.0mm · 0.94mm/px · 2 of 21 slices shown]
[im 1/21]
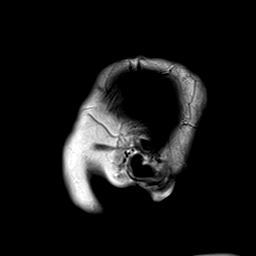
[im 21/21]
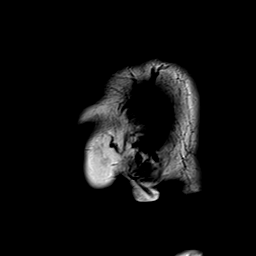

[Series 15: T2 · axial · 5.0mm · 0.72mm/px · z∈[-65,+61]mm · 2 of 20 slices shown]
[im 1/20]
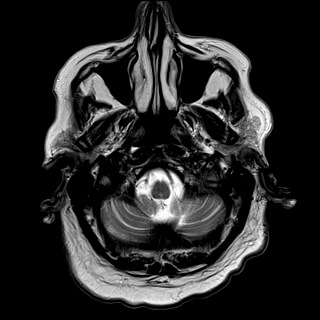
[im 20/20]
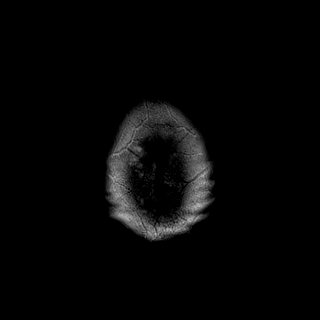

[Series 16: FLAIR · axial · 4.0mm · 0.43mm/px · z∈[-76,+71]mm · 4 of 40 slices shown]
[im 1/40]
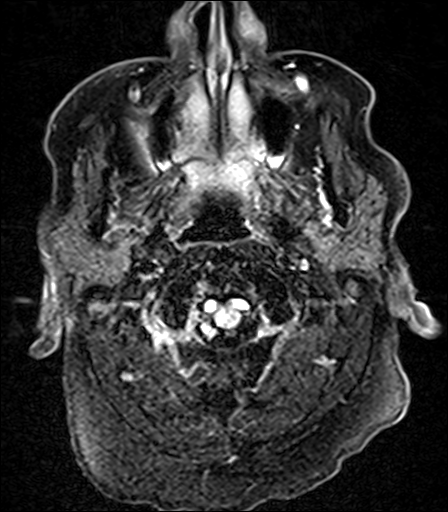
[im 14/40]
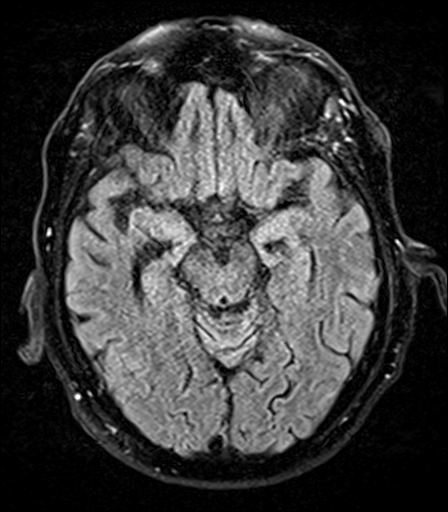
[im 27/40]
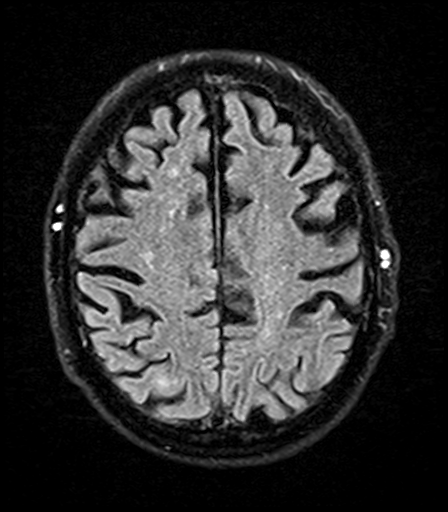
[im 40/40]
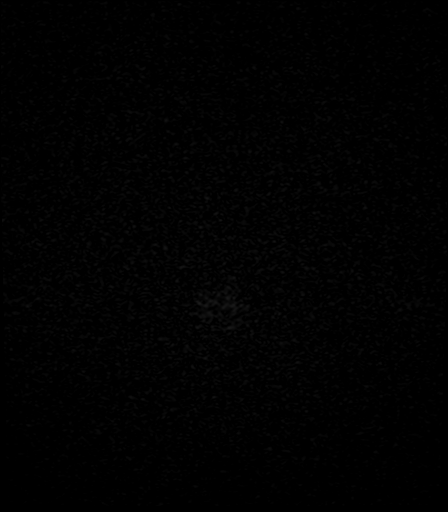

[29 of 48 positions shown; findings below may reference images not displayed]

FINDINGS: MRI HEAD FINDINGS

Brain: Scattered small areas of mildly increased signal on
diffusion-weighted imaging without definite restricted diffusion on
ADC map. Possible subacute or chronic infarct involving the right
frontal parietal white matter and cerebellum bilaterally. Some of
these areas show increased signal on FLAIR.

Image quality degraded by motion. Patient not able to complete the
study.

Generalized atrophy without hydrocephalus. No mass or midline shift.
Chronic microvascular ischemia is present.

Vascular: Normal arterial flow voids at the skull base

Skull and upper cervical spine: No focal skeletal abnormality.

Sinuses/Orbits: Paranasal sinuses clear. Bilateral cataract
extraction

Other: None

MRA HEAD FINDINGS

Anterior circulation: Internal carotid artery widely patent with
mild atherosclerotic irregularity. Anterior and middle cerebral
arteries patent bilaterally. Atherosclerotic stenosis in MCA
branches bilaterally. Moderate stenosis superior division right MCA.
No large vessel occlusion. Negative for aneurysm

Posterior circulation: Both vertebral arteries patent to the
basilar. PICA not included on the study. Basilar widely patent.
Posterior cerebral arteries patent bilaterally. Atherosclerotic
irregularity in the distal PCA bilaterally. Fetal origin right PCA.
No aneurysm.

Anatomic variants: None
IMPRESSION: Multiple small areas of increased signal on diffusion imaging
without definite restricted diffusion on ADC. These areas include
the right frontal parietal white matter and cerebellum bilaterally.
These may be due to subacute or chronic infarct.

Intracranial atherosclerotic disease in the middle cerebral arteries
and posterior cerebral arteries bilaterally. No large vessel
occlusion.

## 2022-12-29 IMAGING — CT CT RENAL STONE PROTOCOL
2 of 4 series · 16 of 46 positions shown, 18 images · non-contrast
Comparison: Abdominal sonogram 07/20/2017

CLINICAL DATA: Flank pain.  Rule out kidney stone



[Series 2: axial st · axial · 0.89mm/px · z∈[+841,+1286]mm · 13 of 101 slices shown, 15 images]
[im 6/101  soft-tissue]
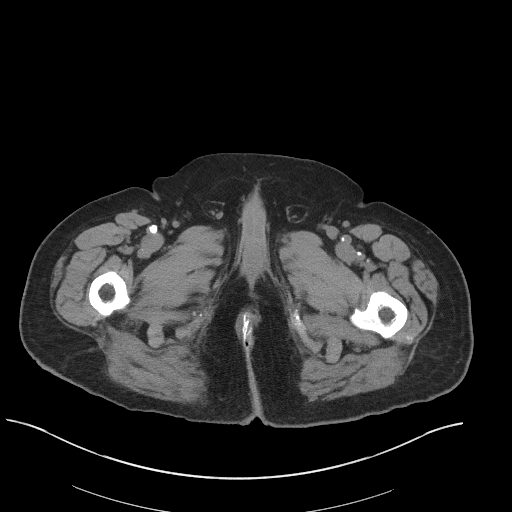
[im 6/101  bone]
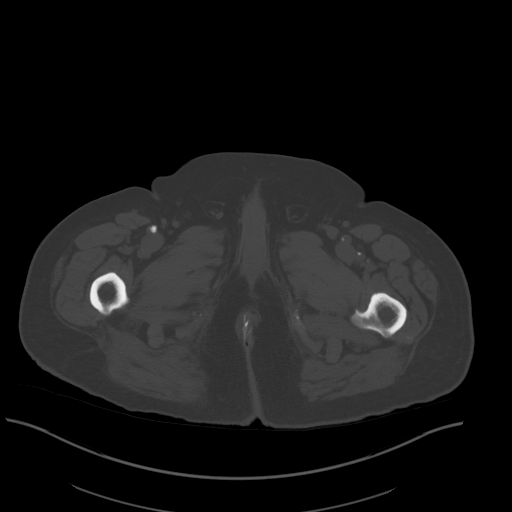
[im 16/101  soft-tissue]
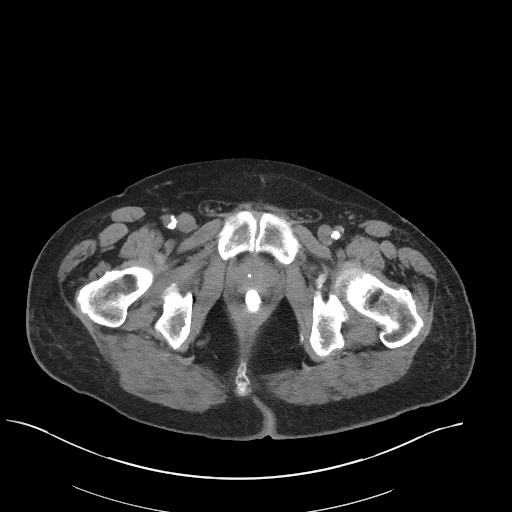
[im 22/101  soft-tissue]
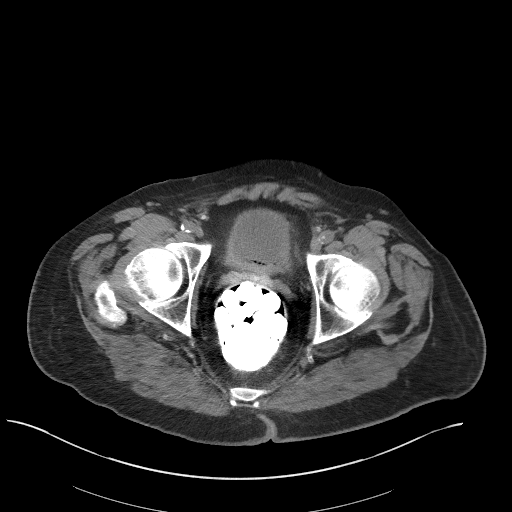
[im 27/101  soft-tissue]
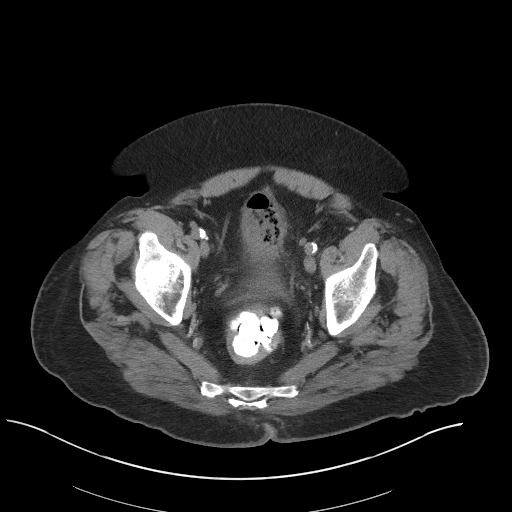
[im 37/101  soft-tissue]
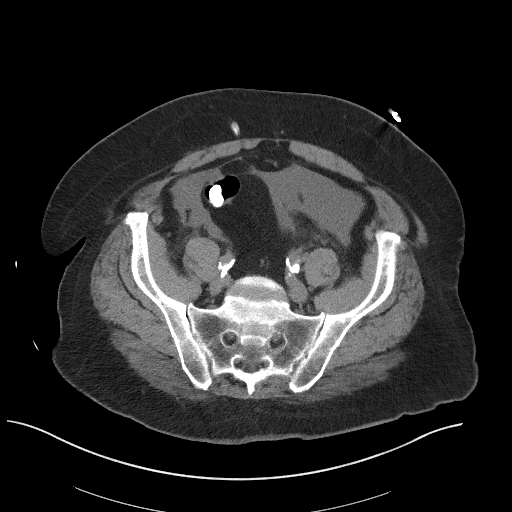
[im 43/101  soft-tissue]
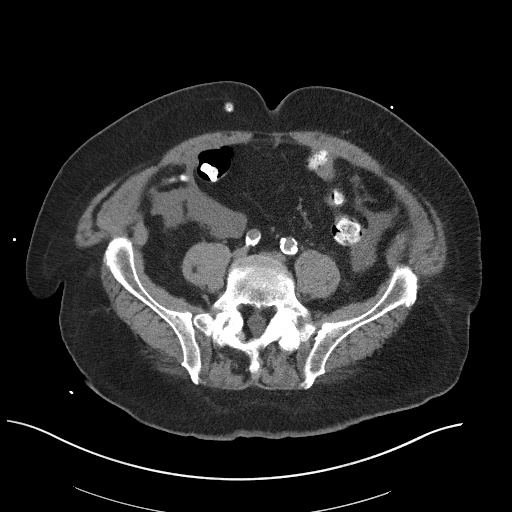
[im 53/101  soft-tissue]
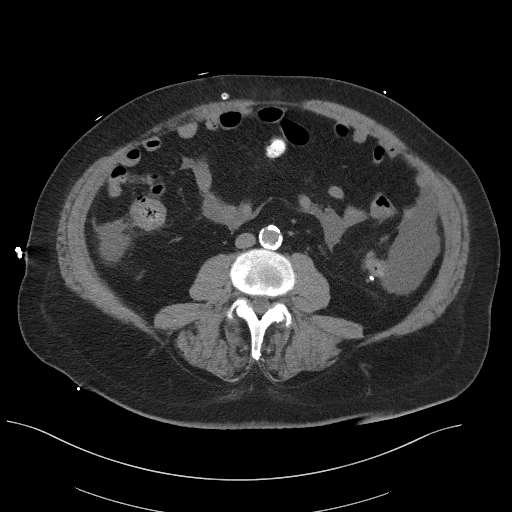
[im 58/101  soft-tissue]
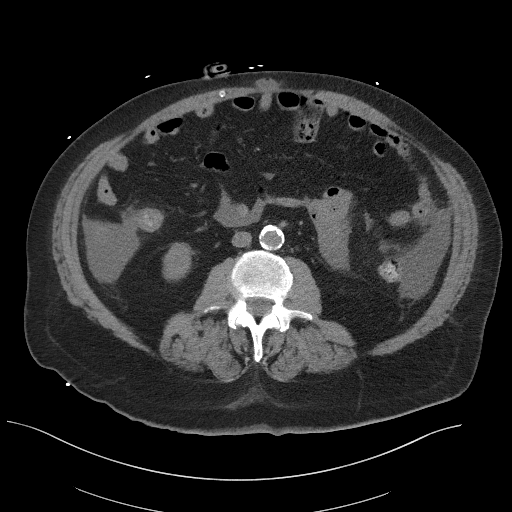
[im 64/101  soft-tissue]
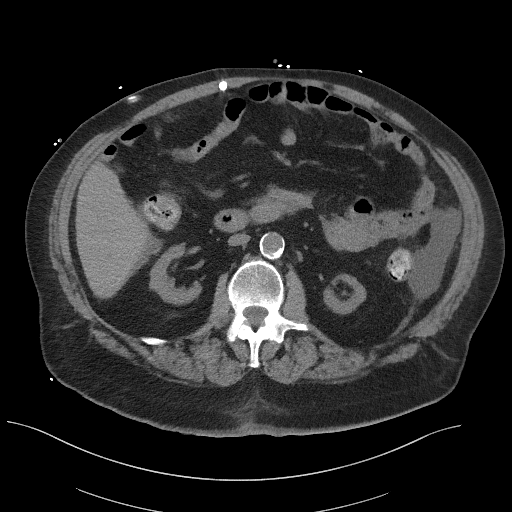
[im 64/101  bone]
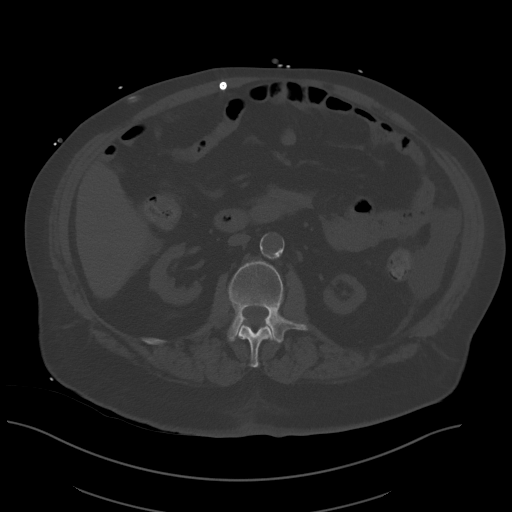
[im 74/101  soft-tissue]
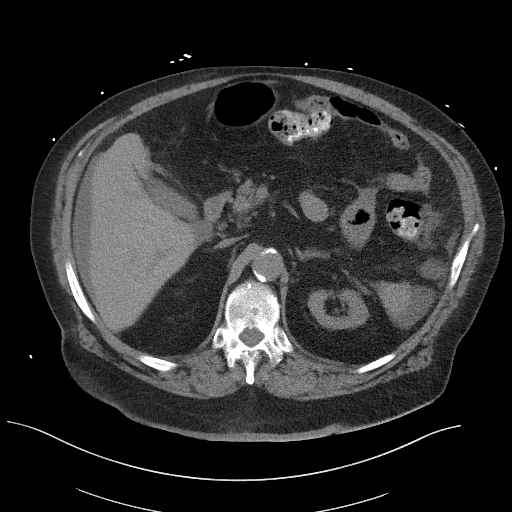
[im 79/101  soft-tissue]
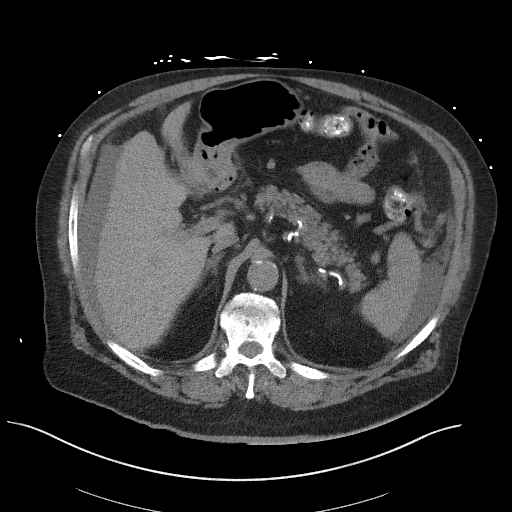
[im 85/101  soft-tissue]
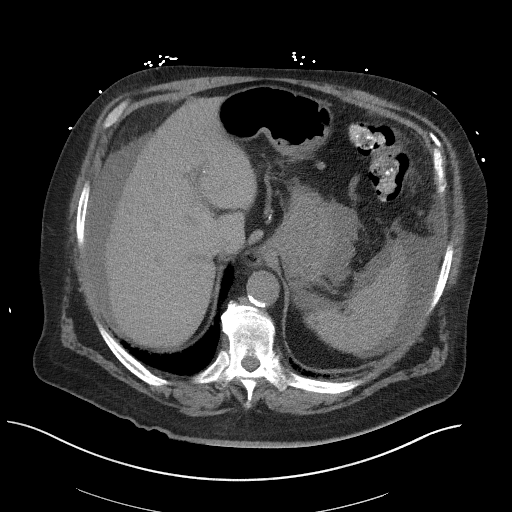
[im 95/101  soft-tissue]
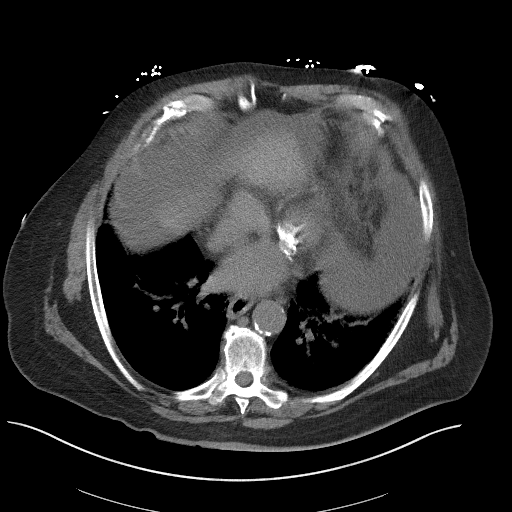

[Series 5: coronal st · coronal · 0.95mm/px · 3 of 120 slices shown]
[im 40/120  soft-tissue]
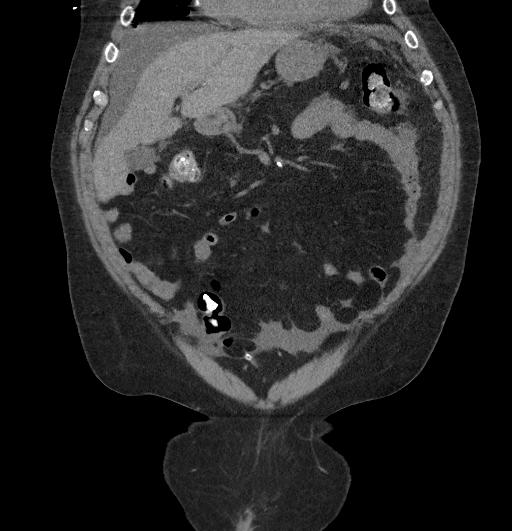
[im 53/120  soft-tissue]
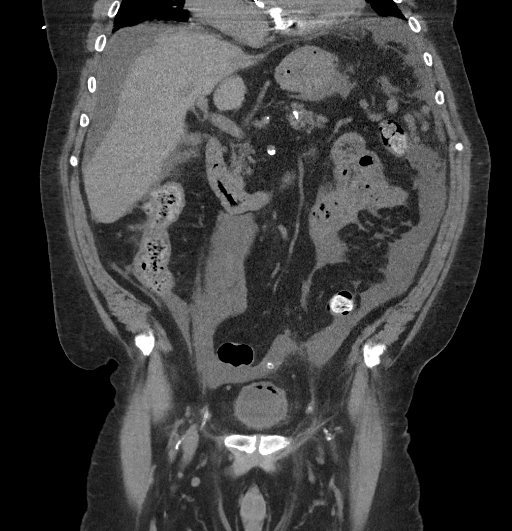
[im 67/120  soft-tissue]
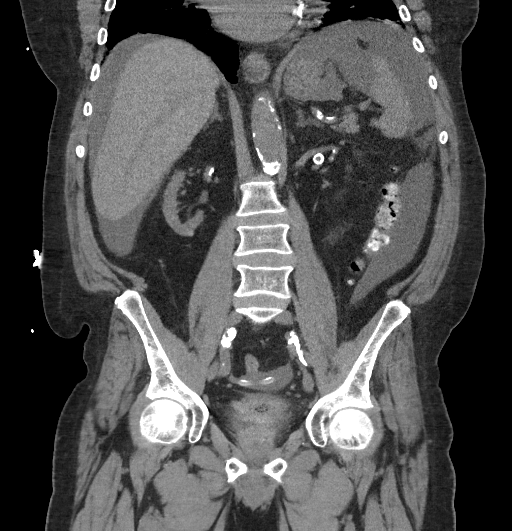

[16 of 46 positions shown; findings below may reference images not displayed]

FINDINGS: Lower chest: No acute abnormality. No focal liver abnormality
identified. Gallbladder appears normal. No bile duct dilatation.

Hepatobiliary: No focal liver abnormality is seen. No gallstones,
gallbladder wall thickening, or biliary dilatation.

Pancreas: Unremarkable. No pancreatic ductal dilatation or
surrounding inflammatory changes.

Spleen: Normal in size without focal abnormality.

Adrenals/Urinary Tract:

Normal adrenal glands.

There are 2 small foci of gas identified within the upper pole
collecting system of the left kidney, image [DATE] and image [DATE]. No
kidney stone or mass noted bilaterally. Peripherally calcified left
renal artery aneurysm measures 9 mm. 8 mm calcified right renal
artery aneurysm noted.

No nephrolithiasis or hydronephrosis identified bilaterally. Mild
bilateral renal cortical thinning. No hydroureter or ureteral
lithiasis identified. There is diffuse bladder wall thickening with
mild surrounding soft tissue haziness. Gas is noted within the wall
of the urinary bladder.

Stomach/Bowel: Small hiatal hernia. The appendix is visualized and
appears normal. No bowel wall thickening, inflammation or
distension. Large desiccated stool ball is identified within the
rectum measuring 7.4 x 6.3 cm.

Vascular/Lymphatic: Aortic atherosclerosis without aneurysm. No
signs of abdominopelvic adenopathy.

Reproductive: Prostate is unremarkable.

Other: Peritoneal dialysis catheter is identified with moderate
volume of free fluid within the abdomen and pelvis.

Musculoskeletal: No acute or significant osseous findings.
IMPRESSION: 1. There is diffuse bladder wall thickening with mild surrounding
soft tissue haziness. Gas is noted within the wall of the urinary
bladder. Findings are concerning for cystitis. Emphysematous
cystitis not excluded. Small foci of gas is also noted within the
left renal collecting system.
2. No nephrolithiasis or hydronephrosis identified bilaterally.
3. Large desiccated stool ball identified within the rectum.
Correlate for any clinical signs or symptoms of fecal impaction.
4. Bilateral calcified renal artery aneurysms.
5. Dialysis catheter in place with moderate free fluid in the
abdomen pelvis likely reflecting dialysate.
6. Aortic Atherosclerosis ([id number]).

Critical Value/emergent results were called by telephone at the time
of interpretation on 11/19/2021 at [DATE] to provider BREEANA
ZINN , who verbally acknowledged these results.
